# Patient Record
Sex: Female | Born: 1965 | Race: Black or African American | Hispanic: No | Smoking: Current every day smoker
Health system: Southern US, Community
[De-identification: ages and names within clinical notes are randomized; demographics above are authoritative.]

## PROBLEM LIST (undated history)

## (undated) DIAGNOSIS — J40 Bronchitis, not specified as acute or chronic: Secondary | ICD-10-CM

## (undated) DIAGNOSIS — R011 Cardiac murmur, unspecified: Secondary | ICD-10-CM

## (undated) DIAGNOSIS — E785 Hyperlipidemia, unspecified: Secondary | ICD-10-CM

## (undated) DIAGNOSIS — R0600 Dyspnea, unspecified: Secondary | ICD-10-CM

## (undated) DIAGNOSIS — E119 Type 2 diabetes mellitus without complications: Secondary | ICD-10-CM

## (undated) DIAGNOSIS — J302 Other seasonal allergic rhinitis: Secondary | ICD-10-CM

## (undated) DIAGNOSIS — I10 Essential (primary) hypertension: Secondary | ICD-10-CM

## (undated) DIAGNOSIS — J449 Chronic obstructive pulmonary disease, unspecified: Secondary | ICD-10-CM

## (undated) HISTORY — PX: POLYPECTOMY: SHX149

## (undated) HISTORY — DX: Hyperlipidemia, unspecified: E78.5

## (undated) HISTORY — PX: COLONOSCOPY: SHX174

## (undated) HISTORY — PX: CYSTECTOMY: SUR359

## (undated) HISTORY — PX: BACK SURGERY: SHX140

## (undated) HISTORY — DX: Other seasonal allergic rhinitis: J30.2

## (undated) HISTORY — DX: Essential (primary) hypertension: I10

## (undated) HISTORY — PX: ROTATOR CUFF REPAIR: SHX139

## (undated) HISTORY — PX: TYMPANOSTOMY TUBE PLACEMENT: SHX32

---
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
## 1998-04-01 HISTORY — PX: ABDOMINAL HYSTERECTOMY: SHX81

## 2008-01-27 ENCOUNTER — Emergency Department (HOSPITAL_COMMUNITY): Admission: EM | Admit: 2008-01-27 | Discharge: 2008-01-27 | Payer: Self-pay | Admitting: Emergency Medicine

## 2008-03-22 ENCOUNTER — Emergency Department (HOSPITAL_COMMUNITY): Admission: EM | Admit: 2008-03-22 | Discharge: 2008-03-22 | Payer: Self-pay | Admitting: Family Medicine

## 2008-04-27 ENCOUNTER — Other Ambulatory Visit: Admission: RE | Admit: 2008-04-27 | Discharge: 2008-04-27 | Payer: Self-pay | Admitting: Internal Medicine

## 2008-04-27 ENCOUNTER — Encounter: Payer: Self-pay | Admitting: Internal Medicine

## 2008-04-27 ENCOUNTER — Ambulatory Visit: Payer: Self-pay | Admitting: Internal Medicine

## 2008-04-27 ENCOUNTER — Telehealth: Payer: Self-pay | Admitting: Internal Medicine

## 2008-04-27 DIAGNOSIS — N63 Unspecified lump in unspecified breast: Secondary | ICD-10-CM | POA: Insufficient documentation

## 2008-04-27 DIAGNOSIS — J159 Unspecified bacterial pneumonia: Secondary | ICD-10-CM | POA: Insufficient documentation

## 2008-04-27 DIAGNOSIS — R05 Cough: Secondary | ICD-10-CM | POA: Insufficient documentation

## 2008-04-27 DIAGNOSIS — H60399 Other infective otitis externa, unspecified ear: Secondary | ICD-10-CM | POA: Insufficient documentation

## 2008-04-27 DIAGNOSIS — R059 Cough, unspecified: Secondary | ICD-10-CM | POA: Insufficient documentation

## 2008-04-27 LAB — CONVERTED CEMR LAB
Cholesterol: 201 mg/dL (ref 0–200)
Direct LDL: 146 mg/dL
HDL: 46.6 mg/dL (ref >39.0)
Total CHOL/HDL Ratio: 4.3
Triglycerides: 65 mg/dL (ref 0–149)
VLDL: 13 mg/dL (ref 0–40)

## 2008-05-02 ENCOUNTER — Encounter: Payer: Self-pay | Admitting: Internal Medicine

## 2008-09-09 ENCOUNTER — Emergency Department (HOSPITAL_COMMUNITY): Admission: EM | Admit: 2008-09-09 | Discharge: 2008-09-09 | Payer: Self-pay | Admitting: Emergency Medicine

## 2008-09-10 ENCOUNTER — Emergency Department (HOSPITAL_COMMUNITY): Admission: EM | Admit: 2008-09-10 | Discharge: 2008-09-10 | Payer: Self-pay | Admitting: Emergency Medicine

## 2009-02-23 ENCOUNTER — Emergency Department (HOSPITAL_COMMUNITY): Admission: EM | Admit: 2009-02-23 | Discharge: 2009-02-23 | Payer: Self-pay | Admitting: Emergency Medicine

## 2009-03-05 ENCOUNTER — Emergency Department (HOSPITAL_COMMUNITY): Admission: EM | Admit: 2009-03-05 | Discharge: 2009-03-05 | Payer: Self-pay | Admitting: Emergency Medicine

## 2009-05-28 ENCOUNTER — Emergency Department (HOSPITAL_COMMUNITY): Admission: EM | Admit: 2009-05-28 | Discharge: 2009-05-28 | Payer: Self-pay | Admitting: Emergency Medicine

## 2009-07-31 ENCOUNTER — Emergency Department (HOSPITAL_COMMUNITY): Admission: EM | Admit: 2009-07-31 | Discharge: 2009-07-31 | Payer: Self-pay | Admitting: Emergency Medicine

## 2009-09-23 ENCOUNTER — Emergency Department (HOSPITAL_COMMUNITY): Admission: EM | Admit: 2009-09-23 | Discharge: 2009-09-23 | Payer: Self-pay | Admitting: Emergency Medicine

## 2009-12-25 ENCOUNTER — Emergency Department (HOSPITAL_COMMUNITY): Admission: EM | Admit: 2009-12-25 | Discharge: 2009-12-25 | Payer: Self-pay | Admitting: Emergency Medicine

## 2009-12-26 ENCOUNTER — Emergency Department (HOSPITAL_COMMUNITY): Admission: EM | Admit: 2009-12-26 | Discharge: 2009-12-26 | Payer: Self-pay | Admitting: Emergency Medicine

## 2010-01-06 ENCOUNTER — Emergency Department (HOSPITAL_COMMUNITY): Admission: EM | Admit: 2010-01-06 | Discharge: 2010-01-06 | Payer: Self-pay | Admitting: Emergency Medicine

## 2010-02-17 ENCOUNTER — Emergency Department (HOSPITAL_COMMUNITY)
Admission: EM | Admit: 2010-02-17 | Discharge: 2010-02-17 | Payer: Self-pay | Source: Home / Self Care | Admitting: Emergency Medicine

## 2010-02-18 ENCOUNTER — Emergency Department (HOSPITAL_COMMUNITY): Admission: EM | Admit: 2010-02-18 | Discharge: 2010-02-18 | Payer: Self-pay | Admitting: Emergency Medicine

## 2010-03-08 ENCOUNTER — Emergency Department (HOSPITAL_COMMUNITY): Admission: EM | Admit: 2010-03-08 | Discharge: 2010-02-15 | Payer: Self-pay | Admitting: Emergency Medicine

## 2010-05-01 NOTE — Letter (Signed)
Summary: Results Follow-up Letter  [facility]  [address] [place], [place] [address]   Phone: [phone]  Fax: [phone]    05/02/2008  [address] [place], [place]  [address]  Dear Ms. Halseth,   The following are the results of your recent test(s):  Test     Result     Pap Smear    Normal___x____  Not Normal_____       Comments:  __________________________________________ Other Tests:   _________________________________________________________  Please call for an appointment as directed _________________________________________________________ _________________________________________________________ _________________________________________________________  Sincerely,  Sanda Linger MD [facility]

## 2010-05-01 NOTE — Assessment & Plan Note (Signed)
Summary: NEW PT---[employer]--$50---PKG-STC   Vital Signs:  Patient Profile:   45 Years Old Female Height:     66 inches Weight:      190 pounds BMI:     30.78 Temp:     97.0 degrees F oral Pulse rate:   80 / minute Pulse rhythm:   regular BP sitting:   118 / 80  (left arm) Cuff size:   regular  Pt. in pain?   no  Vitals Entered By: Rock Nephew CMA (April 27, 2008 9:09 AM)                  PCP:  Etta Grandchild MD  Chief Complaint:  New to establish.  History of Present Illness: This is a new pt. to me who reports the development of symptoms over the last few weeks with left earache and muffled hearing. She had bilateral tympanic tubes placed about 4-5 years ago in [place].  She has also had a cough recently. She also wants a complete physical with FLP and PAP smear today.   Acute Visit History:      The patient complains of cough and earache.  She denies abdominal pain, chest pain, constipation, diarrhea, eye symptoms, fever, headache, nasal discharge, nausea, rash, sinus problems, sore throat, and vomiting.        The patient notes wheezing and shortness of breath.  The character of the cough is described as nonproductive.  She has no history of COPD.  There is no history of sleep interference, respiratory retractions, tachypnea, cyanosis, or interference with oral intake associated with her cough.        The earache is located on the left side.  She has had recurrent otitis media.  There is no history of recent antibiotic usage or cold/URI symptoms associated with the earache.         Otitis Media History:      Positive risk factors for otitis media include passive smoke exposure.    Allergies: No Known Drug Allergies     Prior Medications Reviewed Using: Patient Recall  Prior Medication List:  No prior medications documented  Updated Prior Medication List: No Medications Current Allergies (reviewed today): No known allergies   Past Medical History:  chronic bronchitis    recurrent OM, s/p bilateral tympanoplasty  Past Surgical History:    Hysterectomy    Left wrist ganglion cyst removed   Family History:    Family History Diabetes 1st degree relative    Family History Hypertension    Family History of Stroke F 1st degree relative <60  Social History:    Occupation: Truck Engineer, drilling    Current Smoker    Alcohol use-no    Drug use-no    Regular exercise-no   Risk Factors:  Tobacco use:  current    Year started:  1985    Cigarettes:  Yes -- 1 pack(s) per day    Counseled to quit/cut down tobacco use:  yes Passive smoke exposure:  yes Drug use:  no HIV high-risk behavior:  no Alcohol use:  no Exercise:  no  Family History Risk Factors:    Family History of MI in females < 56 years old:  no    Family History of MI in males < 32 years old:  no   Review of Systems       The patient complains of prolonged cough, suspicious skin lesions, and breast masses.  The patient denies anorexia,  fever, weight loss, weight gain, vision loss, decreased hearing, hoarseness, chest pain, syncope, dyspnea on exertion, peripheral edema, headaches, hemoptysis, abdominal pain, melena, hematochezia, severe indigestion/heartburn, hematuria, depression, enlarged lymph nodes, and angioedema.     Physical Exam  General:     alert, well-developed, well-nourished, well-hydrated, and overweight-appearing.   Eyes:     No corneal or conjunctival inflammation noted. EOMI. Perrla. Funduscopic exam benign, without hemorrhages, exudates or papilledema. Vision grossly normal. Ears:     L TM erythema and L TM bulging with effusion. right ear still has a tube present in it.  Nose:     External nasal examination shows no deformity or inflammation. Nasal mucosa are pink and moist without lesions or exudates. Mouth:     Oral mucosa and oropharynx without lesions or exudates.  Teeth in good repair. Neck:     No deformities, masses, or tenderness  noted. Breasts:     there is a 1 cm hyperpigmented lesion with a Subcutaneously palpable nodule over the right breast in the lower/outer quadrant. It is firm and nontender, nonfluctuant, and not erythematous.no abnormal thickening, no nipple discharge, no tenderness, and no adenopathy.   Lungs:     Normal respiratory effort, chest expands symmetrically. Lungs are clear to auscultation, no crackles or wheezes. Heart:     Normal rate and regular rhythm. S1 and S2 normal without gallop, murmur, click, rub or other extra sounds. Abdomen:     soft, non-tender, normal bowel sounds, no distention, no masses, no guarding, no rigidity, no rebound tenderness, no abdominal hernia, no inguinal hernia, no hepatomegaly, no splenomegaly, and abdominal scar(s).   Rectal:     No external abnormalities noted. Normal sphincter tone. No rectal masses or tenderness. heme negative stool. Genitalia:     normal introitus, no external lesions, and vaginal discharge, scant amount with foul odor.  normal introitus, no external lesions, mucosa pink and moist, no adnexal masses or tenderness, and vaginal discharge.   Msk:     normal ROM, no joint tenderness, no joint swelling, no joint warmth, no redness over joints, and no joint deformities.   Pulses:     R and L carotid,radial,femoral,dorsalis pedis and posterior tibial pulses are full and equal bilaterally Extremities:     No clubbing, cyanosis, edema, or deformity noted with normal full range of motion of all joints.   Neurologic:     No cranial nerve deficits noted. Station and gait are normal. Plantar reflexes are down-going bilaterally. DTRs are symmetrical throughout. Sensory, motor and coordinative functions appear intact. Skin:     Intact without suspicious lesions or rashes Cervical Nodes:     No lymphadenopathy noted Axillary Nodes:     No palpable lymphadenopathy Inguinal Nodes:     No significant adenopathy Psych:     Oriented X3, memory intact for  recent and remote, normally interactive, good eye contact, not anxious appearing, not depressed appearing, and not agitated.    Vital Signs: Temp        97.0 Pulse        80 Weight       190 lb  Pediatric Physical Exam: General:     alert, well-developed, well-nourished, well-hydrated, and overweight-appearing.   Skin:     Intact without suspicious lesions or rashes Eyes:     No corneal or conjunctival inflammation noted. EOMI. Perrla. Funduscopic exam benign, without hemorrhages, exudates or papilledema. Vision grossly normal. Nose:     External nasal examination shows no deformity or inflammation. Nasal  mucosa are pink and moist without lesions or exudates. Throat:      Oral mucosa and oropharynx without lesions or exudates.  Teeth in good repair. Neck:     No deformities, masses, or tenderness noted. Lungs:       Normal respiratory effort, chest expands symmetrically. Lungs are clear to auscultation, no crackles or wheezes. Heart:       Normal rate and regular rhythm. S1 and S2 normal without gallop, murmur, click, rub or other extra sounds. Abdomen:   soft, non-tender, normal bowel sounds, no distention, no masses, no guarding, no rigidity, no rebound tenderness, no abdominal hernia, no inguinal hernia, no hepatomegaly, no splenomegaly, and abdominal scar(s).   Neurologic:   No cranial nerve deficits noted. Station and gait are normal. Plantar reflexes are down-going bilaterally. DTRs are symmetrical throughout. Sensory, motor and coordinative functions appear intact.    Impression & Recommendations:  Problem # 1:  COUGH (ICD-786.2) Assessment: New  Orders: T-2 View CXR, Same Day (71020.5TC)   Problem # 2:  LUMP OR MASS IN BREAST (UJW-119.14) Assessment: New schedule mammogram, order faxed to [facility].  Problem # 3:  ROUTINE GENERAL MEDICAL EXAM@[email or web address]  CARE FACL (ICD-V70.0) Assessment: New EKG shows NSR with no Q waves and no ST/T wave  abnormalities. 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 Take one (1) tablet by mouth two (2) times a day x 10 days    Zithromax 500 Mg Tab (Azithromycin) .Marland Kitchen... Take on by mouth once daily for days   Complete Medication List: 1)  Ceftin 500 Mg Tab (Cefuroxime axetil) .... Take one (1) tablet by mouth two (2) times a day x 10 days 2)  Zithromax 500 Mg Tab (Azithromycin) .... Take on by mouth once daily for days   Patient Instructions: 1)  Please schedule a follow-up appointment in 2 weeks. 2)  Tobacco is very bad for your health and your loved ones! You Should stop smoking!. 3)  Stop Smoking Tips: Choose a Quit date. Cut down before the Quit date. decide what you will do as a substitute when you feel the urge to smoke(gum,toothpick,exercise). 4)  It is important that you exercise regularly at least 20 minutes 5 times a week. If you develop chest pain, have severe difficulty breathing, or feel very tired , stop exercising immediately and seek medical attention. 5)  You need to lose weight. Consider a lower calorie diet and regular exercise.  6)  Schedule your mammogram.   Prescriptions: ZITHROMAX 500 MG TAB (AZITHROMYCIN) take on by mouth once daily for days  #3 x 0   Entered and Authorized by:   Etta Grandchild MD   Signed by:   Etta Grandchild MD on 04/27/2008   Method used:   Historical   RxID:   [id number] CEFTIN 500 MG TAB (CEFUROXIME AXETIL) Take one (1) tablet by mouth two (2) times a day X 10 days  #20 x 0   Entered and Authorized by:   Etta Grandchild MD   Signed by:   Etta Grandchild MD on 04/27/2008   Method used:   Print then Give  to Patient   RxID:   [id number]  Pneumovax Immunization History:    Pneumovax # 1:  Pneumovax (01/04/2004)

## 2010-05-01 NOTE — Progress Notes (Signed)
Summary: antibiotic  ---- Converted from flag ---- ---- 04/27/2008 12:47 PM, Etta Grandchild MD wrote: please call her and tell her the chest xray was positive for pneumonia, we need to get a pharmacy for antibiotics to get started ------------------------------  Phone Note Outgoing Call   Call placed by: Rock Nephew CMA,  April 27, 2008 1:06 PM Call placed to: Patient Summary of Call: Called patient//lmovm to call back. Need name of pharmacy to call in antibiotic per MD Initial call taken by: Rock Nephew CMA,  April 27, 2008 1:06 PM  Follow-up for Phone Call        Called patient and advised of results. Patient could not remember pharmacy info and states that she will call back to give information Follow-up by: Rock Nephew CMA,  April 28, 2008 9:24 AM  Additional Follow-up for Phone Call Additional follow up Details #1::        Pharm entered in pt's chart Additional Follow-up by: Payton Spark CMA,  April 28, 2008 11:41 AM      Appended Document: antibiotic Prescriptions: ZITHROMAX 500 MG TAB (AZITHROMYCIN) take on by mouth once daily for days  #3 x 0   Entered and Authorized by:   Etta Grandchild MD   Signed by:   Etta Grandchild MD on 04/28/2008   Method used:   Electronically to        [facility] [id number] (retail)       [address] [place], [place]  [address]       Ph: [phone]       Fax: [phone]   RxID:   [id number]

## 2010-05-13 ENCOUNTER — Emergency Department (HOSPITAL_COMMUNITY)
Admission: EM | Admit: 2010-05-13 | Discharge: 2010-05-13 | Disposition: A | Discharge status: Home / Self Care | Payer: Self-pay | Attending: Emergency Medicine | Admitting: Emergency Medicine

## 2010-05-13 DIAGNOSIS — Z9889 Other specified postprocedural states: Secondary | ICD-10-CM | POA: Insufficient documentation

## 2010-05-13 DIAGNOSIS — M542 Cervicalgia: Secondary | ICD-10-CM | POA: Insufficient documentation

## 2010-05-13 DIAGNOSIS — N390 Urinary tract infection, site not specified: Secondary | ICD-10-CM | POA: Insufficient documentation

## 2010-05-13 DIAGNOSIS — A5901 Trichomonal vulvovaginitis: Secondary | ICD-10-CM | POA: Insufficient documentation

## 2010-05-13 DIAGNOSIS — G8929 Other chronic pain: Secondary | ICD-10-CM | POA: Insufficient documentation

## 2010-05-13 DIAGNOSIS — R319 Hematuria, unspecified: Secondary | ICD-10-CM | POA: Insufficient documentation

## 2010-05-13 DIAGNOSIS — N898 Other specified noninflammatory disorders of vagina: Secondary | ICD-10-CM | POA: Insufficient documentation

## 2010-05-13 LAB — URINALYSIS, ROUTINE W REFLEX MICROSCOPIC
Bilirubin Urine: NEGATIVE
Hgb urine dipstick: NEGATIVE
Ketones, ur: NEGATIVE mg/dL
Nitrite: NEGATIVE
Protein, ur: NEGATIVE mg/dL
Specific Gravity, Urine: 1.021 (ref 1.005–1.030)
Urine Glucose, Fasting: NEGATIVE mg/dL
Urobilinogen, UA: 0.2 mg/dL (ref 0.0–1.0)
pH: 6 (ref 5.0–8.0)

## 2010-05-13 LAB — CBC
HCT: 43 % (ref 36.0–46.0)
Hemoglobin: 14.6 g/dL (ref 12.0–15.0)
MCH: 27.9 pg (ref 26.0–34.0)
MCHC: 34 g/dL (ref 30.0–36.0)
MCV: 82.1 fL (ref 78.0–100.0)
Platelets: 207 10*3/uL (ref 150–400)
RBC: 5.24 MIL/uL — ABNORMAL HIGH (ref 3.87–5.11)
RDW: 15.8 % — ABNORMAL HIGH (ref 11.5–15.5)
WBC: 9.9 10*3/uL (ref 4.0–10.5)

## 2010-05-13 LAB — URINE MICROSCOPIC-ADD ON

## 2010-05-13 LAB — DIFFERENTIAL
Basophils Absolute: 0.1 10*3/uL (ref 0.0–0.1)
Basophils Relative: 1 % (ref 0–1)
Eosinophils Absolute: 0.3 10*3/uL (ref 0.0–0.7)
Eosinophils Relative: 3 % (ref 0–5)
Lymphocytes Relative: 40 % (ref 12–46)
Lymphs Abs: 3.9 10*3/uL (ref 0.7–4.0)
Monocytes Absolute: 0.6 10*3/uL (ref 0.1–1.0)
Monocytes Relative: 6 % (ref 3–12)
Neutro Abs: 5.1 10*3/uL (ref 1.7–7.7)
Neutrophils Relative %: 51 % (ref 43–77)

## 2010-05-13 LAB — WET PREP, GENITAL
Clue Cells Wet Prep HPF POC: NONE SEEN
Yeast Wet Prep HPF POC: NONE SEEN

## 2010-05-15 LAB — GC/CHLAMYDIA PROBE AMP, GENITAL
Chlamydia, DNA Probe: NEGATIVE
GC Probe Amp, Genital: NEGATIVE

## 2010-06-14 LAB — RAPID STREP SCREEN (MED CTR MEBANE ONLY): Streptococcus, Group A Screen (Direct): NEGATIVE

## 2010-09-11 ENCOUNTER — Emergency Department (HOSPITAL_COMMUNITY)
Admission: EM | Admit: 2010-09-11 | Discharge: 2010-09-12 | Discharge status: Against Medical Advice | Payer: Self-pay | Attending: Emergency Medicine | Admitting: Emergency Medicine

## 2010-09-11 DIAGNOSIS — Z0389 Encounter for observation for other suspected diseases and conditions ruled out: Secondary | ICD-10-CM | POA: Insufficient documentation

## 2010-10-22 ENCOUNTER — Emergency Department (HOSPITAL_COMMUNITY)
Admission: EM | Admit: 2010-10-22 | Discharge: 2010-10-22 | Disposition: A | Discharge status: Home / Self Care | Payer: Self-pay | Attending: Emergency Medicine | Admitting: Emergency Medicine

## 2010-10-22 ENCOUNTER — Emergency Department (HOSPITAL_COMMUNITY): Payer: Self-pay

## 2010-10-22 DIAGNOSIS — R05 Cough: Secondary | ICD-10-CM | POA: Insufficient documentation

## 2010-10-22 DIAGNOSIS — R6889 Other general symptoms and signs: Secondary | ICD-10-CM | POA: Insufficient documentation

## 2010-10-22 DIAGNOSIS — J3489 Other specified disorders of nose and nasal sinuses: Secondary | ICD-10-CM | POA: Insufficient documentation

## 2010-10-22 DIAGNOSIS — F17200 Nicotine dependence, unspecified, uncomplicated: Secondary | ICD-10-CM | POA: Insufficient documentation

## 2010-10-22 DIAGNOSIS — J9801 Acute bronchospasm: Secondary | ICD-10-CM | POA: Insufficient documentation

## 2010-10-22 DIAGNOSIS — J069 Acute upper respiratory infection, unspecified: Secondary | ICD-10-CM | POA: Insufficient documentation

## 2010-10-22 DIAGNOSIS — R059 Cough, unspecified: Secondary | ICD-10-CM | POA: Insufficient documentation

## 2010-10-22 DIAGNOSIS — R011 Cardiac murmur, unspecified: Secondary | ICD-10-CM | POA: Insufficient documentation

## 2010-10-22 DIAGNOSIS — J40 Bronchitis, not specified as acute or chronic: Secondary | ICD-10-CM | POA: Insufficient documentation

## 2010-10-24 ENCOUNTER — Emergency Department (HOSPITAL_COMMUNITY)
Admission: EM | Admit: 2010-10-24 | Discharge: 2010-10-24 | Disposition: A | Discharge status: Home / Self Care | Payer: Self-pay | Attending: Emergency Medicine | Admitting: Emergency Medicine

## 2010-10-24 DIAGNOSIS — R05 Cough: Secondary | ICD-10-CM | POA: Insufficient documentation

## 2010-10-24 DIAGNOSIS — R059 Cough, unspecified: Secondary | ICD-10-CM | POA: Insufficient documentation

## 2010-10-24 DIAGNOSIS — R062 Wheezing: Secondary | ICD-10-CM | POA: Insufficient documentation

## 2011-01-01 LAB — CBC
HCT: 42.4
Hemoglobin: 14.2
MCHC: 33.4
MCV: 81.1
Platelets: 216
RBC: 5.23 — ABNORMAL HIGH
RDW: 14.5
WBC: 9.9

## 2011-01-01 LAB — DIFFERENTIAL
Basophils Absolute: 0.1
Basophils Relative: 1
Eosinophils Absolute: 0.4
Eosinophils Relative: 4
Lymphocytes Relative: 26
Lymphs Abs: 2.5
Monocytes Absolute: 0.7
Monocytes Relative: 7
Neutro Abs: 6.2
Neutrophils Relative %: 62

## 2011-05-16 ENCOUNTER — Emergency Department (HOSPITAL_COMMUNITY): Payer: Self-pay

## 2011-05-16 ENCOUNTER — Emergency Department (HOSPITAL_COMMUNITY)
Admission: EM | Admit: 2011-05-16 | Discharge: 2011-05-16 | Disposition: A | Discharge status: Home / Self Care | Payer: Self-pay | Attending: Emergency Medicine | Admitting: Emergency Medicine

## 2011-05-16 ENCOUNTER — Encounter (HOSPITAL_COMMUNITY): Payer: Self-pay | Admitting: *Deleted

## 2011-05-16 DIAGNOSIS — J069 Acute upper respiratory infection, unspecified: Secondary | ICD-10-CM | POA: Insufficient documentation

## 2011-05-16 DIAGNOSIS — F17200 Nicotine dependence, unspecified, uncomplicated: Secondary | ICD-10-CM | POA: Insufficient documentation

## 2011-05-16 DIAGNOSIS — R05 Cough: Secondary | ICD-10-CM | POA: Insufficient documentation

## 2011-05-16 DIAGNOSIS — J3489 Other specified disorders of nose and nasal sinuses: Secondary | ICD-10-CM | POA: Insufficient documentation

## 2011-05-16 DIAGNOSIS — R059 Cough, unspecified: Secondary | ICD-10-CM | POA: Insufficient documentation

## 2011-05-16 MED ORDER — ALBUTEROL SULFATE HFA 108 (90 BASE) MCG/ACT IN AERS
2.0000 | INHALATION_SPRAY | RESPIRATORY_TRACT | Status: DC
  Administered 2011-05-16: 2 via RESPIRATORY_TRACT
  Filled 2011-05-16: qty 6.7

## 2011-05-16 MED ORDER — OXYCODONE HCL 5 MG PO TABS
5.0000 mg | ORAL_TABLET | Freq: Once | ORAL | Status: AC
  Administered 2011-05-16: 5 mg via ORAL
  Filled 2011-05-16: qty 1

## 2011-05-16 NOTE — ED Notes (Signed)
Pt stated understanding of discharge instructions.

## 2011-05-16 NOTE — ED Provider Notes (Signed)
History     CSN: [id number]  Arrival date & time 05/16/11  0100   First MD Initiated Contact with Patient 05/16/11 0158      Chief Complaint  Patient presents with  . Influenza     Patient is a 46 y.o. female presenting with flu symptoms. The history is provided by the patient.  Influenza  the patient reports upper respiratory symptoms for approximately 24 hours.  She's had cough and nasal congestion.  She's had sinus pressure.  She denies sore throat.  She denies fevers and chills.  Nothing worsens her symptoms.  Nothing improves her symptoms.  Her symptoms are mild in severity.  She reports she was around someone yesterday who had a "cold"  History reviewed. No pertinent past medical history.  History reviewed. No pertinent past surgical history.  History reviewed. No pertinent family history.  History  Substance Use Topics  . Smoking status: Current Everyday Smoker  . Smokeless tobacco: Not on file  . Alcohol Use: Yes    OB History    Grav Para Term Preterm Abortions TAB SAB Ect Mult Living                  Review of Systems  Allergies  Hydrocodone and Mobic  Home Medications   Current Outpatient Rx  Name Route Sig Dispense Refill  . ASPIRIN EC 81 MG PO TBEC Oral Take 81 mg by mouth daily as needed. Shoulder pain    . NAPROXEN 500 MG PO TABS Oral Take 500 mg by mouth 2 (two) times daily with a meal. Shoulder pain    . OXYCODONE-ACETAMINOPHEN 5-325 MG PO TABS Oral Take 1-2 tablets by mouth every 4 (four) hours as needed. Pain May have every 4 to 6 hours    . TRAMADOL HCL 50 MG PO TABS Oral Take 50 mg by mouth every 8 (eight) hours as needed. Shoulder pain      BP 146/81  Pulse 92  Temp 98.4 F (36.9 C)  Resp 22  SpO2 98%  Physical Exam  Nursing note and vitals reviewed. Constitutional: She is oriented to person, place, and time. She appears well-developed and well-nourished. No distress.  HENT:  Head: Normocephalic and atraumatic.  Eyes: EOM are  normal.  Neck: Normal range of motion.  Cardiovascular: Normal rate, regular rhythm and normal heart sounds.   Pulmonary/Chest: Effort normal. She has wheezes.  Abdominal: Soft. She exhibits no distension. There is no tenderness.  Musculoskeletal: Normal range of motion.  Neurological: She is alert and oriented to person, place, and time.  Skin: Skin is warm and dry.  Psychiatric: She has a normal mood and affect. Judgment normal.    ED Course  Procedures (including critical care time)  Labs Reviewed - No data to display Dg Chest 2 View  05/16/2011  *RADIOLOGY REPORT*  Clinical Data: Influenza  CHEST - 2 VIEW  Comparison: 10/22/2010  Findings: Shallow inspiration.  Heart size and pulmonary vascularity are normal for technique.  Peribronchial thickening and perihilar interstitial changes consistent with chronic bronchitis or reactive airways disease.  No focal airspace consolidation.  No blunting of costophrenic angles.  No pneumothorax.  Since the previous study, there is been interval resection or resorption of the distal right clavicle.  Otherwise, no change.  IMPRESSION: Peribronchial thickening interstitial changes consistent with chronic bronchitis.  No focal consolidation.  Original Report Authenticated By: Marlon Pel, M.D.   I personally reviewed the x-ray  1. Upper respiratory tract infection  MDM  Likely viral upper respiratory tract infections.  The patient is well-appearing.  She is nontoxic.  No hypoxia on exam.   Normal work of breathing. .  Close followup with PCP         Lyanne Co, MD 05/16/11 [phone]

## 2011-05-16 NOTE — ED Notes (Signed)
Pt c/o cough headache chest congestion for 2 days.  No idea of temp

## 2011-05-16 NOTE — ED Notes (Signed)
PT c/o cough since yesterday. Denies N/V, diaphoresis, or fever

## 2011-05-18 ENCOUNTER — Emergency Department (HOSPITAL_COMMUNITY)
Admission: EM | Admit: 2011-05-18 | Discharge: 2011-05-18 | Discharge status: Against Medical Advice | Payer: Self-pay | Attending: Emergency Medicine | Admitting: Emergency Medicine

## 2011-05-18 ENCOUNTER — Encounter (HOSPITAL_COMMUNITY): Payer: Self-pay | Admitting: Emergency Medicine

## 2011-05-18 DIAGNOSIS — R059 Cough, unspecified: Secondary | ICD-10-CM | POA: Insufficient documentation

## 2011-05-18 DIAGNOSIS — R05 Cough: Secondary | ICD-10-CM | POA: Insufficient documentation

## 2011-05-18 HISTORY — DX: Bronchitis, not specified as acute or chronic: J40

## 2011-05-18 NOTE — ED Notes (Signed)
C/o wheezing x 2 days.  Reports productive cough with yellow sputum.

## 2011-05-19 ENCOUNTER — Encounter (HOSPITAL_COMMUNITY): Payer: Self-pay | Admitting: *Deleted

## 2011-05-19 ENCOUNTER — Emergency Department (HOSPITAL_COMMUNITY)
Admission: EM | Admit: 2011-05-19 | Discharge: 2011-05-19 | Disposition: A | Discharge status: Home / Self Care | Payer: Self-pay | Attending: Emergency Medicine | Admitting: Emergency Medicine

## 2011-05-19 ENCOUNTER — Emergency Department (HOSPITAL_COMMUNITY): Payer: Self-pay

## 2011-05-19 DIAGNOSIS — J40 Bronchitis, not specified as acute or chronic: Secondary | ICD-10-CM | POA: Insufficient documentation

## 2011-05-19 DIAGNOSIS — J3489 Other specified disorders of nose and nasal sinuses: Secondary | ICD-10-CM | POA: Insufficient documentation

## 2011-05-19 DIAGNOSIS — Z7982 Long term (current) use of aspirin: Secondary | ICD-10-CM | POA: Insufficient documentation

## 2011-05-19 DIAGNOSIS — R059 Cough, unspecified: Secondary | ICD-10-CM | POA: Insufficient documentation

## 2011-05-19 DIAGNOSIS — R509 Fever, unspecified: Secondary | ICD-10-CM | POA: Insufficient documentation

## 2011-05-19 DIAGNOSIS — H6690 Otitis media, unspecified, unspecified ear: Secondary | ICD-10-CM | POA: Insufficient documentation

## 2011-05-19 DIAGNOSIS — R070 Pain in throat: Secondary | ICD-10-CM | POA: Insufficient documentation

## 2011-05-19 DIAGNOSIS — R05 Cough: Secondary | ICD-10-CM | POA: Insufficient documentation

## 2011-05-19 DIAGNOSIS — F17200 Nicotine dependence, unspecified, uncomplicated: Secondary | ICD-10-CM | POA: Insufficient documentation

## 2011-05-19 DIAGNOSIS — Z79899 Other long term (current) drug therapy: Secondary | ICD-10-CM | POA: Insufficient documentation

## 2011-05-19 DIAGNOSIS — R0989 Other specified symptoms and signs involving the circulatory and respiratory systems: Secondary | ICD-10-CM | POA: Insufficient documentation

## 2011-05-19 MED ORDER — PREDNISONE 20 MG PO TABS
40.0000 mg | ORAL_TABLET | Freq: Once | ORAL | Status: AC
  Administered 2011-05-19: 40 mg via ORAL
  Filled 2011-05-19: qty 2

## 2011-05-19 MED ORDER — AZITHROMYCIN 250 MG PO TABS: 250.0000 mg | ORAL_TABLET | Freq: Every day | ORAL | Status: AC

## 2011-05-19 MED ORDER — IPRATROPIUM BROMIDE 0.02 % IN SOLN
0.5000 mg | RESPIRATORY_TRACT | Status: AC
  Administered 2011-05-19: 0.5 mg via RESPIRATORY_TRACT
  Filled 2011-05-19: qty 2.5

## 2011-05-19 MED ORDER — PREDNISONE 20 MG PO TABS: 20.0000 mg | ORAL_TABLET | Freq: Every day | ORAL | Status: AC

## 2011-05-19 MED ORDER — ALBUTEROL SULFATE (5 MG/ML) 0.5% IN NEBU
5.0000 mg | INHALATION_SOLUTION | Freq: Once | RESPIRATORY_TRACT | Status: AC
  Administered 2011-05-19: 5 mg via RESPIRATORY_TRACT
  Filled 2011-05-19: qty 1

## 2011-05-19 NOTE — ED Provider Notes (Signed)
History     CSN: [id number]  Arrival date & time 05/19/11  0046   First MD Initiated Contact with Patient 05/19/11 0050      Chief Complaint  Patient presents with  . Wheezing    (Consider location/radiation/quality/duration/timing/severity/associated sxs/prior treatment) HPI Comments: 46 year old female with a history of bronchitis and chronic tobacco abuse who presents with ongoing cough, nasal congestion and sore throat. She was seen approximately 2 days ago in the emergency department for similar symptoms and a normal chest x-ray at that time. She states that despite using albuterol inhalers at home she continues to have wheezing, coughing and shortness of breath. Symptoms are persistent, moderate, nothing makes better or worse, no associated fevers chills nausea vomiting abdominal pain leg swelling.  The history is provided by the patient and medical records.    Past Medical History  Diagnosis Date  . Bronchitis     Past Surgical History  Procedure Date  . Abdominal hysterectomy     History reviewed. No pertinent family history.  History  Substance Use Topics  . Smoking status: Current Everyday Smoker  . Smokeless tobacco: Not on file  . Alcohol Use: Yes    OB History    Grav Para Term Preterm Abortions TAB SAB Ect Mult Living                  Review of Systems  All other systems reviewed and are negative.    Allergies  Hydrocodone and Mobic  Home Medications   Current Outpatient Rx  Name Route Sig Dispense Refill  . ASPIRIN EC 81 MG PO TBEC Oral Take 81 mg by mouth daily as needed. Shoulder pain    . NAPROXEN 500 MG PO TABS Oral Take 500 mg by mouth 2 (two) times daily with a meal. Shoulder pain    . OXYCODONE-ACETAMINOPHEN 5-325 MG PO TABS Oral Take 1-2 tablets by mouth every 4 (four) hours as needed. Pain May have every 4 to 6 hours    . TRAMADOL HCL 50 MG PO TABS Oral Take 50 mg by mouth every 8 (eight) hours as needed. Shoulder pain    .  AZITHROMYCIN 250 MG PO TABS Oral Take 1 tablet (250 mg total) by mouth daily. 500mg  PO day 1, then 250mg  PO days 205 6 tablet 0  . PREDNISONE 20 MG PO TABS Oral Take 1 tablet (20 mg total) by mouth daily. 10 tablet 0    BP 144/89  Pulse 102  Temp(Src) 99.5 F (37.5 C) (Oral)  Resp 20  SpO2 95%  Physical Exam  Nursing note and vitals reviewed. Constitutional: She appears well-developed and well-nourished. No distress.  HENT:  Head: Normocephalic and atraumatic.  Mouth/Throat: Oropharynx is clear and moist. No oropharyngeal exudate.  Eyes: Conjunctivae and EOM are normal. Pupils are equal, round, and reactive to light. Right eye exhibits no discharge. Left eye exhibits no discharge. No scleral icterus.  Neck: Normal range of motion. Neck supple. No JVD present. No thyromegaly present.  Cardiovascular: Normal rate, regular rhythm, normal heart sounds and intact distal pulses.  Exam reveals no gallop and no friction rub.   No murmur heard. Pulmonary/Chest: Effort normal. No respiratory distress. She has wheezes. She has rales ( Scattered rales at the bases bilaterally, clears with breathing and coughing).  Abdominal: Soft. Bowel sounds are normal. She exhibits no distension and no mass. There is no tenderness.  Musculoskeletal: Normal range of motion. She exhibits no edema and no tenderness.  Lymphadenopathy:  She has no cervical adenopathy.  Neurological: She is alert. Coordination normal.  Skin: Skin is warm and dry. No rash noted. No erythema.  Psychiatric: She has a normal mood and affect. Her behavior is normal.    ED Course  Procedures (including critical care time)  Labs Reviewed - No data to display Dg Chest 2 View  05/19/2011  *RADIOLOGY REPORT*  Clinical Data: Cough, fever, shortness of breath and chest congestion.  CHEST - 2 VIEW  Comparison: Chest radiograph performed 05/15/2013  Findings: The lungs are well-aerated.  Mildly worsened peribronchial thickening and  increased interstitial markings are seen; given the lack of cardiomegaly, edema is considered less likely.  This could reflect an atypical infection, given clinical concern.  No pleural effusion or pneumothorax is seen.  The heart is normal in size; the mediastinal contour is within normal limits.  No acute osseous abnormalities are seen.  IMPRESSION: Mildly worsened peribronchial thickening and increased interstitial markings.  This could reflect an atypical infection given clinical concern; given the lack of associated findings, edema is considered less likely.  Original Report Authenticated By: Tonia Ghent, M.D.     1. Acute otitis media   2. Bronchitis       MDM  Oxygen level 97-99% on room air, no increased work of breathing, pulse of 100, temperature of 99.5. Repeat chest x-ray rule out developing pneumonia, albuterol inhaler, prednisone. Overall patient is nontoxic in appearance    Improved after medications, prednisone, Z-Pak given for otitis media and bronchitis. Well appearing on discharge  Vida Roller, MD 05/19/11 [phone]

## 2011-05-19 NOTE — ED Notes (Signed)
Pt c/o wheezing x 3 days. Pt smokes, c/o bronchitis

## 2011-05-19 NOTE — Discharge Instructions (Signed)
Call your doctor in the morning for a repeat evaluation within one to 2 days. Take the medication called a Z-Pak for the next 5 days for your urine infection and to help with bronchitis. Take prednisone once a day for 5 days. Your chest x-ray did not show a pneumonia. You may return to the emergency department for severe or worsening symptoms.

## 2011-06-14 ENCOUNTER — Encounter (HOSPITAL_COMMUNITY): Payer: Self-pay | Admitting: Emergency Medicine

## 2011-06-14 ENCOUNTER — Emergency Department (HOSPITAL_COMMUNITY): Payer: Self-pay

## 2011-06-14 ENCOUNTER — Emergency Department (HOSPITAL_COMMUNITY)
Admission: EM | Admit: 2011-06-14 | Discharge: 2011-06-14 | Disposition: A | Discharge status: Home / Self Care | Payer: Self-pay | Attending: Emergency Medicine | Admitting: Emergency Medicine

## 2011-06-14 DIAGNOSIS — J40 Bronchitis, not specified as acute or chronic: Secondary | ICD-10-CM | POA: Insufficient documentation

## 2011-06-14 DIAGNOSIS — Z79899 Other long term (current) drug therapy: Secondary | ICD-10-CM | POA: Insufficient documentation

## 2011-06-14 DIAGNOSIS — J45909 Unspecified asthma, uncomplicated: Secondary | ICD-10-CM | POA: Insufficient documentation

## 2011-06-14 DIAGNOSIS — F17200 Nicotine dependence, unspecified, uncomplicated: Secondary | ICD-10-CM | POA: Insufficient documentation

## 2011-06-14 DIAGNOSIS — R05 Cough: Secondary | ICD-10-CM | POA: Insufficient documentation

## 2011-06-14 DIAGNOSIS — R059 Cough, unspecified: Secondary | ICD-10-CM | POA: Insufficient documentation

## 2011-06-14 DIAGNOSIS — R0602 Shortness of breath: Secondary | ICD-10-CM | POA: Insufficient documentation

## 2011-06-14 DIAGNOSIS — Z7982 Long term (current) use of aspirin: Secondary | ICD-10-CM | POA: Insufficient documentation

## 2011-06-14 MED ORDER — ALBUTEROL SULFATE (5 MG/ML) 0.5% IN NEBU
5.0000 mg | INHALATION_SOLUTION | Freq: Once | RESPIRATORY_TRACT | Status: AC
  Administered 2011-06-14: 5 mg via RESPIRATORY_TRACT
  Filled 2011-06-14: qty 1

## 2011-06-14 MED ORDER — IPRATROPIUM BROMIDE 0.02 % IN SOLN
0.5000 mg | Freq: Once | RESPIRATORY_TRACT | Status: AC
  Administered 2011-06-14: 0.5 mg via RESPIRATORY_TRACT
  Filled 2011-06-14: qty 2.5

## 2011-06-14 MED ORDER — ALBUTEROL SULFATE HFA 108 (90 BASE) MCG/ACT IN AERS: 2.0000 | INHALATION_SPRAY | RESPIRATORY_TRACT | Status: DC | PRN

## 2011-06-14 NOTE — ED Notes (Signed)
Patient with URI symptoms for last two days.  Now with audible wheezing.

## 2011-06-14 NOTE — ED Provider Notes (Signed)
History     CSN: [id number]  Arrival date & time 06/14/11  0300   First MD Initiated Contact with Patient 06/14/11 [phone]      Chief Complaint  Patient presents with  . Shortness of Breath  . Wheezing    (Consider location/radiation/quality/duration/timing/severity/associated sxs/prior treatment) HPI Comments: Patient presents with symptoms of coughing congestion for the last 2 days.  She's noted that she's begun to wheeze as well.  She denies a history of asthma or COPD but notes that when she gets sick she can have wheezing and required nebulizer treatments.  She does not have an albuterol inhaler at home.  She does currently smoke daily.  Patient denies any other chest pain, nausea, vomiting or fevers.  Patient is a 46 y.o. female presenting with cough. The history is provided by the patient. No language interpreter was used.  Cough This is a new problem. The cough is productive of sputum. There has been no fever. Associated symptoms include wheezing. Pertinent negatives include no chest pain, no chills, no headaches, no shortness of breath and no eye redness.    Past Medical History  Diagnosis Date  . Bronchitis     Past Surgical History  Procedure Date  . Abdominal hysterectomy   . Rotator cuff repair     History reviewed. No pertinent family history.  History  Substance Use Topics  . Smoking status: Current Everyday Smoker    Types: Cigarettes  . Smokeless tobacco: Not on file  . Alcohol Use: Yes    OB History    Grav Para Term Preterm Abortions TAB SAB Ect Mult Living                  Review of Systems  Constitutional: Negative.  Negative for fever and chills.  HENT: Positive for congestion.   Eyes: Negative.  Negative for discharge and redness.  Respiratory: Positive for cough and wheezing. Negative for shortness of breath.   Cardiovascular: Negative.  Negative for chest pain.  Gastrointestinal: Negative.  Negative for nausea, vomiting, abdominal pain and  diarrhea.  Genitourinary: Negative.  Negative for dysuria and vaginal discharge.  Musculoskeletal: Negative.  Negative for back pain.  Skin: Negative.  Negative for color change and rash.  Neurological: Negative.  Negative for syncope and headaches.  Hematological: Negative.  Negative for adenopathy.  Psychiatric/Behavioral: Negative.  Negative for confusion.  All other systems reviewed and are negative.    Allergies  Hydrocodone and Mobic  Home Medications   Current Outpatient Rx  Name Route Sig Dispense Refill  . ASPIRIN EC 81 MG PO TBEC Oral Take 81 mg by mouth daily. Shoulder pain    . NAPROXEN 500 MG PO TABS Oral Take 500 mg by mouth 2 (two) times daily with a meal. Shoulder pain    . TRAMADOL HCL 50 MG PO TABS Oral Take 50 mg by mouth every 8 (eight) hours as needed. Shoulder pain      BP 144/77  Pulse 98  Temp(Src) 98.4 F (36.9 C) (Oral)  Resp 18  SpO2 98%  Physical Exam  Nursing note and vitals reviewed. Constitutional: She is oriented to person, place, and time. She appears well-developed and well-nourished.  Non-toxic appearance. She does not have a sickly appearance.  HENT:  Head: Normocephalic and atraumatic.  Eyes: Conjunctivae, EOM and lids are normal. Pupils are equal, round, and reactive to light. No scleral icterus.  Neck: Trachea normal and normal range of motion. Neck supple.  Cardiovascular: Normal rate, regular  rhythm and normal heart sounds.   Pulmonary/Chest: Effort normal. No respiratory distress. She has wheezes. She has no rales.       Patient with expiratory wheezing on exam  Abdominal: Soft. Normal appearance. There is no tenderness. There is no rebound, no guarding and no CVA tenderness.  Musculoskeletal: Normal range of motion.  Neurological: She is alert and oriented to person, place, and time. She has normal strength.  Skin: Skin is warm, dry and intact. No rash noted.  Psychiatric: She has a normal mood and affect. Her behavior is normal.  Judgment and thought content normal.    ED Course  Procedures (including critical care time)  Labs Reviewed - No data to display Dg Chest 2 View  06/14/2011  *RADIOLOGY REPORT*  Clinical Data: Shortness of breath  CHEST - 2 VIEW  Comparison: 05/19/2011  Findings: Mild peribronchial thickening, less prominent from the most recent prior and similar to earlier prios. This may reflect sequelae of chronic bronchitis.  No focal consolidation.  No pleural effusion or pneumothorax.  No acute osseous abnormality. Cardiomediastinal contours within normal limits.  IMPRESSION: Mild peribronchial thickening without focal consolidation.  Original Report Authenticated By: Waneta Martins, M.D.     No diagnosis found.    MDM  Patient with likely viral infection causing her cough which is causing some reactive airway disease.  Patient is improving with nebulizer treatments here.  Patient smokes and has been advised that she should stop.  Patient will receive an albuterol inhaler to be used at home as well.  I Anticipate this patient will be able to be discharged home with albuterol at home.        Nat Christen, MD 06/14/11 0630

## 2011-06-14 NOTE — Discharge Instructions (Signed)
Albuterol inhalation aerosol What is this medicine? ALBUTEROL (al Gaspar Bidding) is a bronchodilator. It helps open up the airways in your lungs to make it easier to breathe. This medicine is used to treat and to prevent bronchospasm. This medicine may be used for other purposes; ask your health care provider or pharmacist if you have questions. What should I tell my health care provider before I take this medicine? They need to know if you have any of the following conditions: -diabetes -heart disease or irregular heartbeat -high blood pressure -pheochromocytoma -seizures -thyroid disease -an unusual or allergic reaction to albuterol, levalbuterol, sulfites, other medicines, foods, dyes, or preservatives -pregnant or trying to get pregnant -breast-feeding How should I use this medicine? This medicine is for inhalation through the mouth. Follow the directions on your prescription label. Take your medicine at regular intervals. Do not use more often than directed. Make sure that you are using your inhaler correctly. Ask you doctor or health care provider if you have any questions. Use this medicine before you use any other inhaler. Wait 5 minutes or more before between using different inhalers. Talk to your pediatrician regarding the use of this medicine in children. Special care may be needed. Overdosage: If you think you have taken too much of this medicine contact a poison control center or emergency room at once. NOTE: This medicine is only for you. Do not share this medicine with others. What if I miss a dose? If you miss a dose, use it as soon as you can. If it is almost time for your next dose, use only that dose. Do not use double or extra doses. What may interact with this medicine? -anti-infectives like chloroquine and pentamidine -caffeine -cisapride -diuretics -medicines for colds -medicines for depression or for emotional or psychotic conditions -medicines for weight loss  including some herbal products -methadone -some antibiotics like clarithromycin, erythromycin, levofloxacin, and linezolid -some heart medicines -steroid hormones like dexamethasone, cortisone, hydrocortisone -theophylline -thyroid hormones This list may not describe all possible interactions. Give your health care provider a list of all the medicines, herbs, non-prescription drugs, or dietary supplements you use. Also tell them if you smoke, drink alcohol, or use illegal drugs. Some items may interact with your medicine. What should I watch for while using this medicine? Tell your doctor or health care professional if your symptoms do not improve. Do not use extra albuterol. If your asthma or bronchitis gets worse while you are using this medicine, call your doctor right away. If your mouth gets dry try chewing sugarless gum or sucking hard candy. Drink water as directed. What side effects may I notice from receiving this medicine? Side effects that you should report to your doctor or health care professional as soon as possible: -allergic reactions like skin rash, itching or hives, swelling of the face, lips, or tongue -breathing problems -chest pain -feeling faint or lightheaded, falls -high blood pressure -irregular heartbeat -fever -muscle cramps or weakness -pain, tingling, numbness in the hands or feet -vomiting Side effects that usually do not require medical attention (report to your doctor or health care professional if they continue or are bothersome): -cough -difficulty sleeping -headache -nervousness or trembling -stomach upset -stuffy or runny nose -throat irritation -unusual taste This list may not describe all possible side effects. Call your doctor for medical advice about side effects. You may report side effects to FDA at 1-800-FDA-1088. Where should I keep my medicine? Keep out of the reach of children. Store at  room temperature between 15 and 30 degrees C (59  and 86 degrees F). The contents are under pressure and may burst when exposed to heat or flame. Do not freeze. This medicine does not work as well if it is too cold. Throw away any unused medicine after the expiration date. Inhalers need to be thrown away after the labeled number of puffs have been used or by the expiration date; whichever comes first. Ventolin HFA should be thrown away 12 months after removing from foil pouch. Check the instructions that come with your medicine. NOTE: This sheet is a summary. It may not cover all possible information. If you have questions about this medicine, talk to your doctor, pharmacist, or health care provider.  2012, [employer] Standard. (08/03/2010 11:00:52 AM)Bronchitis Bronchitis is the body's way of reacting to injury and/or infection (inflammation) of the bronchi. Bronchi are the air tubes that extend from the windpipe into the lungs. If the inflammation becomes severe, it may cause shortness of breath. CAUSES  Inflammation may be caused by:  A virus.   Germs (bacteria).   Dust.   Allergens.   Pollutants and many other irritants.  The cells lining the bronchial tree are covered with tiny hairs (cilia). These constantly beat upward, away from the lungs, toward the mouth. This keeps the lungs free of pollutants. When these cells become too irritated and are unable to do their job, mucus begins to develop. This causes the characteristic cough of bronchitis. The cough clears the lungs when the cilia are unable to do their job. Without either of these protective mechanisms, the mucus would settle in the lungs. Then you would develop pneumonia. Smoking is a common cause of bronchitis and can contribute to pneumonia. Stopping this habit is the single most important thing you can do to help yourself. TREATMENT   Your caregiver may prescribe an antibiotic if the cough is caused by bacteria. Also, medicines that open up your airways make it easier to  breathe. Your caregiver may also recommend or prescribe an expectorant. It will loosen the mucus to be coughed up. Only take over-the-counter or prescription medicines for pain, discomfort, or fever as directed by your caregiver.   Removing whatever causes the problem (smoking, for example) is critical to preventing the problem from getting worse.   Cough suppressants may be prescribed for relief of cough symptoms.   Inhaled medicines may be prescribed to help with symptoms now and to help prevent problems from returning.   For those with recurrent (chronic) bronchitis, there may be a need for steroid medicines.  SEEK IMMEDIATE MEDICAL CARE IF:   During treatment, you develop more pus-like mucus (purulent sputum).   You have a fever.   Your baby is older than 3 months with a rectal temperature of 102 F (38.9 C) or higher.   Your baby is 36 months old or younger with a rectal temperature of 100.4 F (38 C) or higher.   You become progressively more ill.   You have increased difficulty breathing, wheezing, or shortness of breath.  It is necessary to seek immediate medical care if you are elderly or sick from any other disease. MAKE SURE YOU:   Understand these instructions.   Will watch your condition.   Will get help right away if you are not doing well or get worse.  Document Released: 03/18/2005 Document Revised: 03/07/2011 Document Reviewed: 01/26/2008 [employer] Patient Information 2012 [place], [place].

## 2011-11-28 ENCOUNTER — Emergency Department (HOSPITAL_COMMUNITY): Payer: Self-pay

## 2011-11-28 ENCOUNTER — Encounter (HOSPITAL_COMMUNITY): Payer: Self-pay | Admitting: *Deleted

## 2011-11-28 ENCOUNTER — Emergency Department (HOSPITAL_COMMUNITY)
Admission: EM | Admit: 2011-11-28 | Discharge: 2011-11-28 | Disposition: A | Discharge status: Home / Self Care | Payer: Self-pay | Attending: Emergency Medicine | Admitting: Emergency Medicine

## 2011-11-28 DIAGNOSIS — J40 Bronchitis, not specified as acute or chronic: Secondary | ICD-10-CM | POA: Insufficient documentation

## 2011-11-28 DIAGNOSIS — F17200 Nicotine dependence, unspecified, uncomplicated: Secondary | ICD-10-CM | POA: Insufficient documentation

## 2011-11-28 LAB — CBC WITH DIFFERENTIAL/PLATELET
Basophils Absolute: 0.1 10*3/uL (ref 0.0–0.1)
Basophils Relative: 1 % (ref 0–1)
Eosinophils Absolute: 0.4 10*3/uL (ref 0.0–0.7)
Eosinophils Relative: 3 % (ref 0–5)
HCT: 44.2 % (ref 36.0–46.0)
Hemoglobin: 14.8 g/dL (ref 12.0–15.0)
Lymphocytes Relative: 43 % (ref 12–46)
Lymphs Abs: 4.4 10*3/uL — ABNORMAL HIGH (ref 0.7–4.0)
MCH: 27.3 pg (ref 26.0–34.0)
MCHC: 33.5 g/dL (ref 30.0–36.0)
MCV: 81.5 fL (ref 78.0–100.0)
Monocytes Absolute: 0.5 10*3/uL (ref 0.1–1.0)
Monocytes Relative: 5 % (ref 3–12)
Neutro Abs: 4.9 10*3/uL (ref 1.7–7.7)
Neutrophils Relative %: 48 % (ref 43–77)
Platelets: 215 10*3/uL (ref 150–400)
RBC: 5.42 MIL/uL — ABNORMAL HIGH (ref 3.87–5.11)
RDW: 14.8 % (ref 11.5–15.5)
WBC: 10.3 10*3/uL (ref 4.0–10.5)

## 2011-11-28 MED ORDER — IPRATROPIUM BROMIDE 0.02 % IN SOLN
0.5000 mg | Freq: Once | RESPIRATORY_TRACT | Status: AC
  Administered 2011-11-28: 0.5 mg via RESPIRATORY_TRACT
  Filled 2011-11-28: qty 2.5

## 2011-11-28 MED ORDER — NICOTINE POLACRILEX 4 MG MT GUM: 4.0000 mg | CHEWING_GUM | OROMUCOSAL | Status: AC | PRN

## 2011-11-28 MED ORDER — AZITHROMYCIN 250 MG PO TABS: ORAL_TABLET | ORAL | Status: AC

## 2011-11-28 MED ORDER — NICOTINE 21 MG/24HR TD PT24: 1.0000 | MEDICATED_PATCH | TRANSDERMAL | Status: AC

## 2011-11-28 MED ORDER — ALBUTEROL SULFATE (5 MG/ML) 0.5% IN NEBU
5.0000 mg | INHALATION_SOLUTION | Freq: Once | RESPIRATORY_TRACT | Status: AC
  Administered 2011-11-28: 5 mg via RESPIRATORY_TRACT
  Filled 2011-11-28: qty 1

## 2011-11-28 MED ORDER — ALBUTEROL SULFATE HFA 108 (90 BASE) MCG/ACT IN AERS
2.0000 | INHALATION_SPRAY | RESPIRATORY_TRACT | Status: DC | PRN
  Administered 2011-11-28: 2 via RESPIRATORY_TRACT
  Filled 2011-11-28: qty 6.7

## 2011-11-28 NOTE — ED Notes (Signed)
PT with productive cough for several days.  Pt came in tonight b/c can't sleep d/t constant coughing and sob when she lays down.  Pt denies pain.

## 2011-11-28 NOTE — ED Provider Notes (Signed)
Medical screening examination/treatment/procedure(s) were performed by non-physician practitioner and as supervising physician I was immediately available for consultation/collaboration.  Gillis Boardley M Delawrence Fridman, MD 11/28/11 0612 

## 2011-11-28 NOTE — ED Provider Notes (Signed)
History     CSN: [id number]  Arrival date & time 11/28/11  0238   First MD Initiated Contact with Patient 11/28/11 [phone]      Chief Complaint  Patient presents with  . Shortness of Breath    when laying down  . Cough   HPI  History provided by the patient. Patient is a 46 year old African American female who is a current smoker and has past history of recurrent bronchitis infections who presents with several days of productive cough and shortness of breath symptoms. Symptoms are similar to previous bronchitis infections. Patient denies any fever, chills or sweats. She denies any significant nasal congestion or rhinorrhea. No sore throat. No episodes of nausea, vomiting, diarrhea. She denies any chest pain or abdominal discomfort. No hemoptysis. Patient has not taken any medications for symptoms. She denies any other aggravating or alleviating factors.   Past Medical History  Diagnosis Date  . Bronchitis     Past Surgical History  Procedure Date  . Abdominal hysterectomy   . Rotator cuff repair   . Cystectomy     L wrist    No family history on file.  History  Substance Use Topics  . Smoking status: Current Everyday Smoker -- 0.5 packs/day    Types: Cigarettes  . Smokeless tobacco: Not on file  . Alcohol Use: Yes     occasional    OB History    Grav Para Term Preterm Abortions TAB SAB Ect Mult Living                  Review of Systems  Constitutional: Negative for fever, chills and appetite change.  HENT: Negative for congestion, sore throat and rhinorrhea.   Respiratory: Positive for cough, shortness of breath and wheezing.   Cardiovascular: Negative for chest pain and palpitations.  Gastrointestinal: Negative for nausea, vomiting and abdominal pain.    Allergies  Meloxicam  Home Medications   Current Outpatient Rx  Name Route Sig Dispense Refill  . ALBUTEROL SULFATE HFA 108 (90 BASE) MCG/ACT IN AERS Inhalation Inhale 2 puffs into the lungs every 4  (four) hours as needed for wheezing. 1 Inhaler 0  . ASPIRIN EC 81 MG PO TBEC Oral Take 81 mg by mouth daily. Shoulder pain    . NAPROXEN 500 MG PO TABS Oral Take 500 mg by mouth 2 (two) times daily with a meal. Shoulder pain    . TRAMADOL HCL 50 MG PO TABS Oral Take 50 mg by mouth every 8 (eight) hours as needed. Shoulder pain      BP 122/77  Pulse 81  Temp 97.5 F (36.4 C) (Oral)  Resp 16  SpO2 97%  Physical Exam  Nursing note and vitals reviewed. Constitutional: She is oriented to person, place, and time. She appears well-developed and well-nourished. No distress.  HENT:  Head: Normocephalic.  Mouth/Throat: Oropharynx is clear and moist.  Neck: Normal range of motion. Neck supple.       No meningeal signs  Cardiovascular: Normal rate and regular rhythm.   Pulmonary/Chest: Effort normal. No respiratory distress. She has wheezes. She has no rales.  Abdominal: Soft. There is no tenderness. There is no rebound and no guarding.  Neurological: She is alert and oriented to person, place, and time.  Skin: Skin is warm and dry. No rash noted.  Psychiatric: She has a normal mood and affect. Her behavior is normal.    ED Course  Procedures   Results for orders placed during the hospital  encounter of 11/28/11  CBC WITH DIFFERENTIAL      Component Value Range   WBC 10.3  4.0 - 10.5 K/uL   RBC 5.42 (*) 3.87 - 5.11 MIL/uL   Hemoglobin 14.8  12.0 - 15.0 g/dL   HCT 16.1  09.6 - 04.5 %   MCV 81.5  78.0 - 100.0 fL   MCH 27.3  26.0 - 34.0 pg   MCHC 33.5  30.0 - 36.0 g/dL   RDW 40.9  81.1 - 91.4 %   Platelets 215  150 - 400 K/uL   Neutrophils Relative 48  43 - 77 %   Neutro Abs 4.9  1.7 - 7.7 K/uL   Lymphocytes Relative 43  12 - 46 %   Lymphs Abs 4.4 (*) 0.7 - 4.0 K/uL   Monocytes Relative 5  3 - 12 %   Monocytes Absolute 0.5  0.1 - 1.0 K/uL   Eosinophils Relative 3  0 - 5 %   Eosinophils Absolute 0.4  0.0 - 0.7 K/uL   Basophils Relative 1  0 - 1 %   Basophils Absolute 0.1  0.0 -  0.1 K/uL      Dg Chest 2 View  11/28/2011  *RADIOLOGY REPORT*  Clinical Data: Shortness of breath  CHEST - 2 VIEW  Comparison: 06/14/2011  Findings: Chronic bronchitic change.  Mild lung base opacities. Heart size and mediastinal contours within normal range.  No pleural effusion or pneumothorax.  No acute osseous finding.  IMPRESSION: Chronic bronchitic change is similar to prior.  Mild bibasilar opacities; atelectasis versus infiltrate.   Original Report Authenticated By: Waneta Martins, M.D.      1. Bronchitis       MDM  Patient seen and evaluated. Patient sitting in bed comfortably with normal respirations and O2 sats. Occasional cough.  CBC unremarkable. Chest x-ray shows similar chronic lung status type changes. Lungs with wheezing on exam. Albuterol breathing treatment given. Patient reports having improvements. Patient afebrile. Patient does express desire to attempt to quit smoking. We'll provide prescription for nicotine patch.      Angus Seller, [place] 11/28/11 [phone]

## 2012-03-04 ENCOUNTER — Encounter (HOSPITAL_COMMUNITY): Payer: Self-pay | Admitting: Emergency Medicine

## 2012-03-04 ENCOUNTER — Emergency Department (HOSPITAL_COMMUNITY)
Admission: EM | Admit: 2012-03-04 | Discharge: 2012-03-04 | Disposition: A | Discharge status: Home / Self Care | Payer: Worker's Compensation | Attending: Emergency Medicine | Admitting: Emergency Medicine

## 2012-03-04 DIAGNOSIS — Z8709 Personal history of other diseases of the respiratory system: Secondary | ICD-10-CM | POA: Insufficient documentation

## 2012-03-04 DIAGNOSIS — M545 Low back pain, unspecified: Secondary | ICD-10-CM | POA: Insufficient documentation

## 2012-03-04 DIAGNOSIS — Z79899 Other long term (current) drug therapy: Secondary | ICD-10-CM | POA: Insufficient documentation

## 2012-03-04 DIAGNOSIS — Z7982 Long term (current) use of aspirin: Secondary | ICD-10-CM | POA: Insufficient documentation

## 2012-03-04 DIAGNOSIS — F17200 Nicotine dependence, unspecified, uncomplicated: Secondary | ICD-10-CM | POA: Insufficient documentation

## 2012-03-04 DIAGNOSIS — Z87828 Personal history of other (healed) physical injury and trauma: Secondary | ICD-10-CM | POA: Insufficient documentation

## 2012-03-04 MED ORDER — OXYCODONE-ACETAMINOPHEN 5-325 MG PO TABS: 1.0000 | ORAL_TABLET | Freq: Four times a day (QID) | ORAL | Status: DC | PRN

## 2012-03-04 MED ORDER — CYCLOBENZAPRINE HCL 10 MG PO TABS: 10.0000 mg | ORAL_TABLET | Freq: Two times a day (BID) | ORAL | Status: DC | PRN
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
## 2012-03-04 NOTE — ED Provider Notes (Signed)
Medical screening examination/treatment/procedure(s) were performed by non-physician practitioner and as supervising physician I was immediately available for consultation/collaboration.  Spike Desilets, MD 03/04/12 1706 

## 2012-03-04 NOTE — ED Notes (Signed)
Pt reports acute low back pain after standing up from a siitting position yeaterday

## 2012-03-09 DIAGNOSIS — M5010 Cervical disc disorder with radiculopathy, unspecified cervical region: Secondary | ICD-10-CM
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
## 2012-03-10 ENCOUNTER — Encounter (HOSPITAL_COMMUNITY): Payer: Self-pay | Admitting: Pharmacy Technician

## 2012-03-13 ENCOUNTER — Encounter (HOSPITAL_COMMUNITY)
Admission: RE | Admit: 2012-03-13 | Discharge: 2012-03-13 | Disposition: A | Discharge status: Home / Self Care | Payer: Worker's Compensation | Source: Ambulatory Visit | Attending: Orthopedic Surgery | Admitting: Orthopedic Surgery

## 2012-03-13 ENCOUNTER — Encounter (HOSPITAL_COMMUNITY): Payer: Self-pay

## 2012-03-13 HISTORY — DX: Cardiac murmur, unspecified: R01.1

## 2012-03-13 LAB — CBC
HCT: 45.2 % (ref 36.0–46.0)
Hemoglobin: 15 g/dL (ref 12.0–15.0)
MCH: 27.1 pg (ref 26.0–34.0)
MCHC: 33.2 g/dL (ref 30.0–36.0)
MCV: 81.7 fL (ref 78.0–100.0)
Platelets: 204 10*3/uL (ref 150–400)
RBC: 5.53 MIL/uL — ABNORMAL HIGH (ref 3.87–5.11)
RDW: 15.4 % (ref 11.5–15.5)
WBC: 8 10*3/uL (ref 4.0–10.5)

## 2012-03-13 LAB — SURGICAL PCR SCREEN
MRSA, PCR: NEGATIVE
Staphylococcus aureus: POSITIVE — AB

## 2012-03-13 NOTE — Pre-Procedure Instructions (Signed)
20 Aletta Edmunds  03/13/2012   Your procedure is scheduled on:  Thursday, December 19th.  Report to [facility] at 5:30AM.  Call this number if you have problems the morning of surgery: [phone]   Remember:Nothing to eat or drink after Midnight.    Take these medicines the morning of surgery with A SIP OF WATER: May use inhaler.  Bring Albuterol inhaler in with you.  May to Cyclobenzaprine (Flexeril), Hydrocodone- Acetaminophen (Vicodin) or Oxycodone - Acetaminophen (Percocet) if needed.   Do not wear jewelry, make-up or nail polish.  Do not wear lotions, powders, or perfumes. You may wear deodorant.  Do not shave 48 hours prior to surgery. Men may shave face and neck.  Do not bring valuables to the hospital.  Contacts, dentures or bridgework may not be worn into surgery.  Leave suitcase in the car. After surgery it may be brought to your room.  For patients admitted to the hospital, checkout time is 11:00 AM the day of discharge.   Patients discharged the day of surgery will not be allowed to drive home.  Name and phone number of your driver:NA    Special Instructions: Shower using CHG 2 nights before surgery and the night before surgery.  If you shower the day of surgery use CHG.  Use special wash - you have one bottle of CHG for all showers.  You should use approximately 1/3 of the bottle for each shower.   Please read over the following fact sheets that you were given: Pain Booklet, Coughing and Deep Breathing and Surgical Site Infection Prevention

## 2012-03-18 MED ORDER — DEXAMETHASONE SODIUM PHOSPHATE 10 MG/ML IJ SOLN
10.0000 mg | Freq: Once | INTRAMUSCULAR | Status: AC
  Administered 2012-03-19: 10 mg via INTRAVENOUS
  Filled 2012-03-18 (×2): qty 1

## 2012-03-18 MED ORDER — ACETAMINOPHEN 10 MG/ML IV SOLN
1000.0000 mg | Freq: Once | INTRAVENOUS | Status: AC
  Administered 2012-03-19: 1000 mg via INTRAVENOUS
  Filled 2012-03-18: qty 100

## 2012-03-18 MED ORDER — CEFAZOLIN SODIUM-DEXTROSE 2-3 GM-% IV SOLR
2.0000 g | INTRAVENOUS | Status: AC
  Administered 2012-03-19: 2 g via INTRAVENOUS
  Filled 2012-03-18 (×2): qty 50

## 2012-03-19 ENCOUNTER — Observation Stay (HOSPITAL_COMMUNITY): Payer: Worker's Compensation

## 2012-03-19 ENCOUNTER — Encounter (HOSPITAL_COMMUNITY): Payer: Self-pay | Admitting: *Deleted

## 2012-03-19 ENCOUNTER — Encounter (HOSPITAL_COMMUNITY)
Admission: RE | Disposition: A | Discharge status: Home / Self Care | Payer: Self-pay | Source: Ambulatory Visit | Attending: Orthopedic Surgery

## 2012-03-19 ENCOUNTER — Observation Stay (HOSPITAL_COMMUNITY)
Admission: RE | Admit: 2012-03-19 | Discharge: 2012-03-20 | Disposition: A | Discharge status: Home / Self Care | Payer: Worker's Compensation | Source: Ambulatory Visit | Attending: Orthopedic Surgery | Admitting: Orthopedic Surgery

## 2012-03-19 ENCOUNTER — Ambulatory Visit (HOSPITAL_COMMUNITY): Payer: Worker's Compensation | Admitting: Anesthesiology

## 2012-03-19 ENCOUNTER — Ambulatory Visit (HOSPITAL_COMMUNITY): Payer: Worker's Compensation

## 2012-03-19 ENCOUNTER — Encounter (HOSPITAL_COMMUNITY): Payer: Self-pay | Admitting: Anesthesiology

## 2012-03-19 DIAGNOSIS — Z01812 Encounter for preprocedural laboratory examination: Secondary | ICD-10-CM | POA: Insufficient documentation

## 2012-03-19 DIAGNOSIS — M47812 Spondylosis without myelopathy or radiculopathy, cervical region: Principal | ICD-10-CM | POA: Insufficient documentation

## 2012-03-19 DIAGNOSIS — F17200 Nicotine dependence, unspecified, uncomplicated: Secondary | ICD-10-CM | POA: Insufficient documentation

## 2012-03-19 DIAGNOSIS — Z79899 Other long term (current) drug therapy: Secondary | ICD-10-CM | POA: Insufficient documentation

## 2012-03-19 DIAGNOSIS — M5010 Cervical disc disorder with radiculopathy, unspecified cervical region: Secondary | ICD-10-CM

## 2012-03-19 DIAGNOSIS — J4489 Other specified chronic obstructive pulmonary disease: Secondary | ICD-10-CM | POA: Insufficient documentation

## 2012-03-19 DIAGNOSIS — J449 Chronic obstructive pulmonary disease, unspecified: Secondary | ICD-10-CM | POA: Insufficient documentation

## 2012-03-19 DIAGNOSIS — M5020 Other cervical disc displacement, unspecified cervical region: Secondary | ICD-10-CM | POA: Insufficient documentation

## 2012-03-19 HISTORY — PX: ANTERIOR CERVICAL DECOMP/DISCECTOMY FUSION: SHX1161

## 2012-03-19 SURGERY — ANTERIOR CERVICAL DECOMPRESSION/DISCECTOMY FUSION 1 LEVEL
Anesthesia: General | Site: Neck | Laterality: Left | Wound class: Clean

## 2012-03-19 MED ORDER — FENTANYL CITRATE 0.05 MG/ML IJ SOLN
INTRAMUSCULAR | Status: DC | PRN
  Administered 2012-03-19 (×2): 100 ug via INTRAVENOUS
  Administered 2012-03-19 (×3): 50 ug via INTRAVENOUS

## 2012-03-19 MED ORDER — FENTANYL CITRATE 0.05 MG/ML IJ SOLN: 50.0000 ug | Freq: Once | INTRAMUSCULAR | Status: DC

## 2012-03-19 MED ORDER — DEXAMETHASONE 4 MG PO TABS
4.0000 mg | ORAL_TABLET | Freq: Four times a day (QID) | ORAL | Status: DC
  Administered 2012-03-19 – 2012-03-20 (×3): 4 mg via ORAL
  Filled 2012-03-19 (×10): qty 1

## 2012-03-19 MED ORDER — ONDANSETRON HCL 4 MG/2ML IJ SOLN
INTRAMUSCULAR | Status: DC | PRN
  Administered 2012-03-19: 4 mg via INTRAVENOUS

## 2012-03-19 MED ORDER — ONDANSETRON HCL 4 MG/2ML IJ SOLN: 4.0000 mg | INTRAMUSCULAR | Status: DC | PRN

## 2012-03-19 MED ORDER — ACETAMINOPHEN 10 MG/ML IV SOLN
INTRAVENOUS | Status: AC
  Filled 2012-03-19: qty 100

## 2012-03-19 MED ORDER — GLYCOPYRROLATE 0.2 MG/ML IJ SOLN
INTRAMUSCULAR | Status: DC | PRN
  Administered 2012-03-19: 0.6 mg via INTRAVENOUS

## 2012-03-19 MED ORDER — MORPHINE SULFATE 2 MG/ML IJ SOLN: 1.0000 mg | INTRAMUSCULAR | Status: DC | PRN

## 2012-03-19 MED ORDER — ONDANSETRON HCL 4 MG PO TABS: 4.0000 mg | ORAL_TABLET | Freq: Three times a day (TID) | ORAL | Status: DC | PRN

## 2012-03-19 MED ORDER — MIDAZOLAM HCL 5 MG/5ML IJ SOLN
INTRAMUSCULAR | Status: DC | PRN
  Administered 2012-03-19: 2 mg via INTRAVENOUS

## 2012-03-19 MED ORDER — ARTIFICIAL TEARS OP OINT
TOPICAL_OINTMENT | OPHTHALMIC | Status: DC | PRN
  Administered 2012-03-19: 1 via OPHTHALMIC

## 2012-03-19 MED ORDER — HYDROMORPHONE HCL PF 1 MG/ML IJ SOLN
INTRAMUSCULAR | Status: AC
  Filled 2012-03-19: qty 1

## 2012-03-19 MED ORDER — LACTATED RINGERS IV SOLN
INTRAVENOUS | Status: DC | PRN
  Administered 2012-03-19: 07:00:00 via INTRAVENOUS

## 2012-03-19 MED ORDER — MENTHOL 3 MG MT LOZG
1.0000 | LOZENGE | OROMUCOSAL | Status: DC | PRN
  Filled 2012-03-19: qty 9

## 2012-03-19 MED ORDER — ALBUTEROL SULFATE HFA 108 (90 BASE) MCG/ACT IN AERS
2.0000 | INHALATION_SPRAY | RESPIRATORY_TRACT | Status: DC | PRN
  Filled 2012-03-19: qty 6.7

## 2012-03-19 MED ORDER — DEXAMETHASONE SODIUM PHOSPHATE 4 MG/ML IJ SOLN
4.0000 mg | Freq: Four times a day (QID) | INTRAMUSCULAR | Status: DC
  Administered 2012-03-19: 4 mg via INTRAVENOUS
  Filled 2012-03-19 (×8): qty 1

## 2012-03-19 MED ORDER — ZOLPIDEM TARTRATE 5 MG PO TABS: 5.0000 mg | ORAL_TABLET | Freq: Every evening | ORAL | Status: DC | PRN

## 2012-03-19 MED ORDER — CYCLOBENZAPRINE HCL 10 MG PO TABS
10.0000 mg | ORAL_TABLET | Freq: Two times a day (BID) | ORAL | Status: DC | PRN
  Administered 2012-03-19: 10 mg via ORAL
  Filled 2012-03-19: qty 1

## 2012-03-19 MED ORDER — ACETAMINOPHEN 10 MG/ML IV SOLN
1000.0000 mg | Freq: Four times a day (QID) | INTRAVENOUS | Status: AC
  Administered 2012-03-19 – 2012-03-20 (×4): 1000 mg via INTRAVENOUS
  Filled 2012-03-19 (×5): qty 100

## 2012-03-19 MED ORDER — VECURONIUM BROMIDE 10 MG IV SOLR
INTRAVENOUS | Status: DC | PRN
  Administered 2012-03-19: 1 mg via INTRAVENOUS
  Administered 2012-03-19: 2 mg via INTRAVENOUS
  Administered 2012-03-19: 1 mg via INTRAVENOUS

## 2012-03-19 MED ORDER — SODIUM CHLORIDE 0.9 % IV SOLN: 250.0000 mL | INTRAVENOUS | Status: DC

## 2012-03-19 MED ORDER — PROPOFOL 10 MG/ML IV BOLUS
INTRAVENOUS | Status: DC | PRN
  Administered 2012-03-19: 180 mg via INTRAVENOUS

## 2012-03-19 MED ORDER — OXYCODONE HCL 5 MG PO TABS
10.0000 mg | ORAL_TABLET | ORAL | Status: DC | PRN
  Administered 2012-03-19 (×2): 10 mg via ORAL
  Filled 2012-03-19 (×2): qty 2

## 2012-03-19 MED ORDER — HYDROMORPHONE HCL PF 1 MG/ML IJ SOLN
0.2500 mg | INTRAMUSCULAR | Status: DC | PRN
  Administered 2012-03-19 (×4): 0.5 mg via INTRAVENOUS

## 2012-03-19 MED ORDER — LACTATED RINGERS IV SOLN: INTRAVENOUS | Status: DC

## 2012-03-19 MED ORDER — THROMBIN 20000 UNITS EX SOLR
CUTANEOUS | Status: DC | PRN
  Administered 2012-03-19: 09:00:00 via TOPICAL

## 2012-03-19 MED ORDER — NEOSTIGMINE METHYLSULFATE 1 MG/ML IJ SOLN
INTRAMUSCULAR | Status: DC | PRN
  Administered 2012-03-19: 5 mg via INTRAVENOUS

## 2012-03-19 MED ORDER — SODIUM CHLORIDE 0.9 % IJ SOLN: 3.0000 mL | INTRAMUSCULAR | Status: DC | PRN

## 2012-03-19 MED ORDER — PROMETHAZINE HCL 25 MG/ML IJ SOLN: 6.2500 mg | INTRAMUSCULAR | Status: DC | PRN

## 2012-03-19 MED ORDER — MIDAZOLAM HCL 2 MG/2ML IJ SOLN: 1.0000 mg | INTRAMUSCULAR | Status: DC | PRN

## 2012-03-19 MED ORDER — 0.9 % SODIUM CHLORIDE (POUR BTL) OPTIME
TOPICAL | Status: DC | PRN
  Administered 2012-03-19: 1000 mL

## 2012-03-19 MED ORDER — BUPIVACAINE-EPINEPHRINE PF 0.25-1:200000 % IJ SOLN
INTRAMUSCULAR | Status: AC
  Filled 2012-03-19: qty 30

## 2012-03-19 MED ORDER — SODIUM CHLORIDE 0.9 % IJ SOLN
3.0000 mL | Freq: Two times a day (BID) | INTRAMUSCULAR | Status: DC
  Administered 2012-03-19: 3 mL via INTRAVENOUS

## 2012-03-19 MED ORDER — ROCURONIUM BROMIDE 100 MG/10ML IV SOLN
INTRAVENOUS | Status: DC | PRN
  Administered 2012-03-19: 50 mg via INTRAVENOUS

## 2012-03-19 MED ORDER — THROMBIN 20000 UNITS EX SOLR
CUTANEOUS | Status: AC
  Filled 2012-03-19: qty 20000

## 2012-03-19 MED ORDER — CEFAZOLIN SODIUM 1-5 GM-% IV SOLN
1.0000 g | Freq: Three times a day (TID) | INTRAVENOUS | Status: AC
  Administered 2012-03-19 – 2012-03-20 (×2): 1 g via INTRAVENOUS
  Filled 2012-03-19 (×3): qty 50

## 2012-03-19 MED ORDER — LIDOCAINE HCL (CARDIAC) 20 MG/ML IV SOLN
INTRAVENOUS | Status: DC | PRN
  Administered 2012-03-19: 80 mg via INTRAVENOUS

## 2012-03-19 MED ORDER — DOCUSATE SODIUM 100 MG PO CAPS
100.0000 mg | ORAL_CAPSULE | Freq: Two times a day (BID) | ORAL | Status: DC
  Administered 2012-03-19 – 2012-03-20 (×3): 100 mg via ORAL
  Filled 2012-03-19 (×2): qty 1

## 2012-03-19 MED ORDER — BUPIVACAINE-EPINEPHRINE 0.25% -1:200000 IJ SOLN
INTRAMUSCULAR | Status: DC | PRN
  Administered 2012-03-19: 3 mL

## 2012-03-19 MED ORDER — PHENOL 1.4 % MT LIQD: 1.0000 | OROMUCOSAL | Status: DC | PRN

## 2012-03-19 MED ORDER — HYDROMORPHONE HCL PF 1 MG/ML IJ SOLN
INTRAMUSCULAR | Status: AC
  Administered 2012-03-19: 0.5 mg via INTRAVENOUS
  Filled 2012-03-19: qty 1

## 2012-03-19 SURGICAL SUPPLY — 57 items
BIT MILLING PRODISC 2.0 STER (BIT) ×2 IMPLANT
BLADE SURG ROTATE 9660 (MISCELLANEOUS) IMPLANT
BUR EGG ELITE 4.0 (BURR) IMPLANT
BUR MATCHSTICK NEURO 3.0 LAGG (BURR) IMPLANT
CANISTER SUCTION 2500CC (MISCELLANEOUS) ×2 IMPLANT
CLOTH BEACON ORANGE TIMEOUT ST (SAFETY) ×2 IMPLANT
CLSR STERI-STRIP ANTIMIC 1/2X4 (GAUZE/BANDAGES/DRESSINGS) ×2 IMPLANT
COLLAR CERV LO CONTOUR FIRM DE (SOFTGOODS) ×2 IMPLANT
CORDS BIPOLAR (ELECTRODE) ×2 IMPLANT
COVER MAYO STAND STRL (DRAPES) ×6 IMPLANT
COVER SURGICAL LIGHT HANDLE (MISCELLANEOUS) ×4 IMPLANT
CRADLE DONUT ADULT HEAD (MISCELLANEOUS) ×2 IMPLANT
DERMABOND ADVANCED (GAUZE/BANDAGES/DRESSINGS) ×1
DERMABOND ADVANCED .7 DNX12 (GAUZE/BANDAGES/DRESSINGS) ×1 IMPLANT
DISC PRODISC-C MED DEEP 6MM (Neuro Prosthesis/Implant) ×2 IMPLANT
DRAPE C-ARM 42X72 X-RAY (DRAPES) ×2 IMPLANT
DRAPE POUCH INSTRU U-SHP 10X18 (DRAPES) ×2 IMPLANT
DRAPE SURG 17X23 STRL (DRAPES) ×2 IMPLANT
DRAPE U-SHAPE 47X51 STRL (DRAPES) ×2 IMPLANT
DRSG MEPILEX BORDER 4X4 (GAUZE/BANDAGES/DRESSINGS) ×2 IMPLANT
DURAPREP 26ML APPLICATOR (WOUND CARE) ×2 IMPLANT
ELECT COATED BLADE 2.86 ST (ELECTRODE) ×2 IMPLANT
ELECT REM PT RETURN 9FT ADLT (ELECTROSURGICAL) ×2
ELECTRODE REM PT RTRN 9FT ADLT (ELECTROSURGICAL) ×1 IMPLANT
GLOVE BIOGEL PI IND STRL 6.5 (GLOVE) IMPLANT
GLOVE BIOGEL PI IND STRL 8.5 (GLOVE) ×1 IMPLANT
GLOVE BIOGEL PI INDICATOR 6.5 (GLOVE)
GLOVE BIOGEL PI INDICATOR 8.5 (GLOVE) ×1
GLOVE ECLIPSE 6.0 STRL STRAW (GLOVE) IMPLANT
GLOVE ECLIPSE 8.5 STRL (GLOVE) ×2 IMPLANT
GOWN PREVENTION PLUS XXLARGE (GOWN DISPOSABLE) ×2 IMPLANT
GOWN STRL NON-REIN LRG LVL3 (GOWN DISPOSABLE) ×2 IMPLANT
INSERTER TIP F/MED & MED DEEP ×2 IMPLANT
KIT BASIN OR (CUSTOM PROCEDURE TRAY) ×2 IMPLANT
KIT ROOM TURNOVER OR (KITS) ×2 IMPLANT
NEEDLE SPNL 18GX3.5 QUINCKE PK (NEEDLE) ×2 IMPLANT
NS IRRIG 1000ML POUR BTL (IV SOLUTION) ×2 IMPLANT
PACK ORTHO CERVICAL (CUSTOM PROCEDURE TRAY) ×2 IMPLANT
PACK UNIVERSAL I (CUSTOM PROCEDURE TRAY) ×2 IMPLANT
PAD ARMBOARD 7.5X6 YLW CONV (MISCELLANEOUS) ×4 IMPLANT
PATTIES SURGICAL .25X.25 (GAUZE/BANDAGES/DRESSINGS) IMPLANT
PRODISC-C RETAINER SCREWS 14MM ×4 IMPLANT
SPONGE INTESTINAL PEANUT (DISPOSABLE) ×2 IMPLANT
SPONGE SURGIFOAM ABS GEL 100 (HEMOSTASIS) ×2 IMPLANT
STRIP CLOSURE SKIN 1/2X4 (GAUZE/BANDAGES/DRESSINGS) ×2 IMPLANT
SURGIFLO TRUKIT (HEMOSTASIS) IMPLANT
SUT MNCRL AB 3-0 PS2 18 (SUTURE) ×2 IMPLANT
SUT SILK 2 0 (SUTURE) ×1
SUT SILK 2-0 18XBRD TIE 12 (SUTURE) ×1 IMPLANT
SUT VIC AB 2-0 CT1 18 (SUTURE) ×2 IMPLANT
SYR BULB IRRIGATION 50ML (SYRINGE) ×2 IMPLANT
SYR CONTROL 10ML LL (SYRINGE) ×2 IMPLANT
TAPE CLOTH 4X10 WHT NS (GAUZE/BANDAGES/DRESSINGS) ×2 IMPLANT
TAPE UMBILICAL COTTON 1/8X30 (MISCELLANEOUS) ×2 IMPLANT
TOWEL OR 17X24 6PK STRL BLUE (TOWEL DISPOSABLE) ×2 IMPLANT
TOWEL OR 17X26 10 PK STRL BLUE (TOWEL DISPOSABLE) ×2 IMPLANT
WATER STERILE IRR 1000ML POUR (IV SOLUTION) IMPLANT

## 2012-03-19 NOTE — Brief Op Note (Signed)
03/19/2012  10:05 AM  PATIENT:  Alisha Espinoza  46 y.o. female  PRE-OPERATIVE DIAGNOSIS:  C4-5 Left HNP  POST-OPERATIVE DIAGNOSIS:  C4-5 Left HNP  PROCEDURE:  Procedure(s) (LRB) with comments: ANTERIOR CERVICAL DECOMPRESSION/DISCECTOMY FUSION 1 LEVEL (Left) - Total Disc Replacement C4-5  SURGEON:  Surgeon(s) and Role:    * Venita Lick, MD - Primary  PHYSICIAN ASSISTANT:   ASSISTANTS: none   ANESTHESIA:   general  EBL:  Total I/O In: 1600 [I.V.:1600] Out: 35 [Blood:35]  BLOOD ADMINISTERED:none  DRAINS: none   LOCAL MEDICATIONS USED:  MARCAINE     SPECIMEN:  No Specimen  DISPOSITION OF SPECIMEN:  N/A  COUNTS:  YES  TOURNIQUET:  * No tourniquets in log *  DICTATION: .Other Dictation: Dictation Number [id number]  PLAN OF CARE: Admit for overnight observation  PATIENT DISPOSITION:  PACU - hemodynamically stable.

## 2012-03-19 NOTE — H&P (Signed)
No change in clinical exam H+P reviewed  

## 2012-03-19 NOTE — Anesthesia Postprocedure Evaluation (Signed)
  Anesthesia Post-op Note  Patient: Alisha Espinoza  Procedure(s) Performed: Procedure(s) (LRB) with comments: ANTERIOR CERVICAL DECOMPRESSION/DISCECTOMY FUSION 1 LEVEL (Left) - Total Disc Replacement C4-5  Patient Location: PACU  Anesthesia Type:General  Level of Consciousness: awake and alert   Airway and Oxygen Therapy: Patient Spontanous Breathing  Post-op Pain: mild  Post-op Assessment: Post-op Vital signs reviewed, Patient's Cardiovascular Status Stable, Respiratory Function Stable, Patent Airway, No signs of Nausea or vomiting and Pain level controlled  Post-op Vital Signs: stable  Complications: No apparent anesthesia complications

## 2012-03-19 NOTE — Transfer of Care (Signed)
Immediate Anesthesia Transfer of Care Note  Patient: Alisha Espinoza  Procedure(s) Performed: Procedure(s) (LRB) with comments: ANTERIOR CERVICAL DECOMPRESSION/DISCECTOMY FUSION 1 LEVEL (Left) - Total Disc Replacement C4-5  Patient Location: PACU  Anesthesia Type:General  Level of Consciousness: awake, alert , oriented and patient cooperative  Airway & Oxygen Therapy: Patient Spontanous Breathing and Patient connected to nasal cannula oxygen  Post-op Assessment: Report given to PACU RN, Post -op Vital signs reviewed and stable and Patient moving all extremities X 4  Post vital signs: Reviewed and stable  Complications: No apparent anesthesia complications

## 2012-03-19 NOTE — Op Note (Signed)
Alisha Espinoza, Alisha Espinoza               ACCOUNT NO.:  [id number]  MEDICAL RECORD NO.:  [id number]  LOCATION:  5N24C                        FACILITY:  [facility]  PHYSICIAN:  Alvy Beal, MD    DATE OF BIRTH:  11-18-1965  DATE OF PROCEDURE:  03/19/2012 DATE OF DISCHARGE:                              OPERATIVE REPORT   PREOPERATIVE DIAGNOSIS:  Cervical spondylitic radiculopathy, C4-5.  POSTOPERATIVE DIAGNOSIS:  Cervical spondylitic radiculopathy, C4-5.  OPERATIVE PROCEDURE:  Implantation of cervical total disk arthroplasty, C4-5.  COMPLICATIONS:  None.  CONDITION:  Stable.  INSTRUMENTATION SYSTEM USED:  Synthes ProDisc-C.  HISTORY:  This is a very pleasant woman who has been having significant debilitating neck and left arm pain.  After failed attempts of conservative management, she elected to proceed with surgery.  All appropriate risks and benefits were discussed with the patient and she expressed an understanding of these.  At this point, we elected to proceed with surgery.  OPERATIVE NOTE:  The patient was brought to the operating room and placed supine on the operating table.  After successful induction of general anesthesia and endotracheal intubation, TEDs, SCDs were placed. Towels were placed between the shoulder blades and the anterior cervical spine was prepped and draped in a standard fashion.  Time-out was done confirming patient, procedure, and all other pertinent important data. Once this was completed, x-ray was used to identify the incision site over the C4-5 disk space.  The planned incision site was infiltrated with 0.25% Marcaine and then the left-sided transverse incision was made starting at the midline.  Sharp dissection was carried out down to the platysma and the platysma was sharply incised.  I identified the internervous plane between the sternocleidomastoid and the omohyoid and began dissecting sharply in this plane.  I swept the trachea, esophagus, and  the omohyoid medially and protected with a finger, identified the carotid sheath laterally and protected that with a finger.  I then continued my sharp dissection through the deep cervical and prevertebral fascia until I exposed the anterior longitudinal ligament.  I then placed a retractor to protect the trachea and esophagus, and then placed a needle into the 4-5 disk space.  X-ray confirmed that I was at the appropriate level.  Once this was done, I then used the bipolar electrocautery to mobilize the longus coli muscles from the midbody of C4 to the midbody of C5 bilaterally.  I then placed self-retaining retractors underneath the longus coli muscle, deflated the endotracheal cuff, expanded the retractor and then locked it into place.  I performed an annulotomy with the 15-blade scalpel and then used a combination of pituitary rongeurs, curettes, and Kerrison rongeurs to remove the bulk of the C4-5 disk space.  I then used a 2 and 3-mm Kerrison to resect the overhanging osteophyte from the inferior aspect of the C4 vertebral body.  I now cleared excellent visualization of the 4-5 disk space.  Using live fluoro, I identified the midline of the cervical body of C4 and C5 based off of the spinous process.  This was in the AP plane.  I then placed a distraction pin parallel to the disk space in the midline at C4 and  C5.  I then placed the distractor over the distraction pins and then distracted the intervertebral bodies with the distractor and maintained that with the distracting pins.  I then continued my dissection posteriorly using a fine nerve hook and 1 mm Kerrison and a fine curette.  I was able to eventually go down to the posterior longitudinal ligament.  Using the nerve hook, I developed a plane underneath the posterior longitudinal ligament and then used my 1- mm Kerrison to resect the posterior longitudinal ligament to expose the underlying thecal sac.  I did remove fragments  of disk material on the left-hand side.  At this point, I could clearly run my nerve hook underneath the vertebral bodies of C4 and C5 and out under the uncovertebral joint, and I had removed all compressive lesions.  At this point, I then trialed the 6 medium and 5 medium total ProDisc spacer and I felt as though the 5 produced too much gap between the endplate and the trial.  I then elected to use the 6, which did not overstuff the compartment.  There was still some lucency between the trial and the endplate, but it was not as significant as of the 5.  At this point, I felt this was the better fit.  I then secured it into position, confirmed the midline again in the AP plane, and then created my fin cuts using the router.  I then cleaned fin cuts, irrigated the wound copiously with normal saline to ensure that I was free, the all debris was removed.  I then obtained the 6 medium ProDisc-C and malleted to the appropriate depth.  Under live fluoro, I took the neck through a range of motion and it functioned excellent.  I then removed the distraction pins and then sealed the holes with bone wax and sealed the fins with bone wax.  I then made sure I had complete hemostasis using bipolar electrocautery.  I removed the distraction pins and returned the esophagus and trachea to midline.  I then closed the platysma with interrupted 2-0 Vicryl sutures and a 3-0 Monocryl for the skin.  Steri- Strips, dry dressing, and a soft collar were applied.  The patient was extubated and transferred to the PACU without incident.  At the end of the case, all needle and sponge counts were correct.     Alvy Beal, MD     DDB/MEDQ  D:  03/19/2012  T:  03/19/2012  Job:  [id number]

## 2012-03-19 NOTE — Anesthesia Preprocedure Evaluation (Addendum)
Anesthesia Evaluation  Patient identified by MRN, date of birth, ID band Patient awake    Reviewed: Allergy & Precautions, H&P , NPO status , Patient's Chart, lab work & pertinent test results  Airway Mallampati: I TM Distance: >3 FB Neck ROM: Full    Dental  (+) Teeth Intact and Dental Advisory Given   Pulmonary COPD COPD inhaler, Current Smoker,  breath sounds clear to auscultation        Cardiovascular Rhythm:Regular Rate:Normal     Neuro/Psych    GI/Hepatic   Endo/Other    Renal/GU      Musculoskeletal   Abdominal (+) + obese,   Peds  Hematology   Anesthesia Other Findings   Reproductive/Obstetrics                          Anesthesia Physical Anesthesia Plan  ASA: II  Anesthesia Plan: General   Post-op Pain Management:    Induction: Intravenous  Airway Management Planned: Oral ETT  Additional Equipment:   Intra-op Plan:   Post-operative Plan: Extubation in OR  Informed Consent: I have reviewed the patients History and Physical, chart, labs and discussed the procedure including the risks, benefits and alternatives for the proposed anesthesia with the patient or authorized representative who has indicated his/her understanding and acceptance.     Plan Discussed with: CRNA and Surgeon  Anesthesia Plan Comments:         Anesthesia Quick Evaluation

## 2012-03-20 MED ORDER — INFLUENZA VIRUS VACC SPLIT PF IM SUSP
0.5000 mL | INTRAMUSCULAR | Status: AC
  Administered 2012-03-20: 0.5 mL via INTRAMUSCULAR
  Filled 2012-03-20: qty 0.5

## 2012-03-20 MED ORDER — OXYCODONE-ACETAMINOPHEN 10-325 MG PO TABS: 1.0000 | ORAL_TABLET | ORAL | Status: DC | PRN

## 2012-03-20 MED ORDER — DOCUSATE SODIUM 100 MG PO CAPS: 100.0000 mg | ORAL_CAPSULE | Freq: Two times a day (BID) | ORAL | Status: DC

## 2012-03-20 MED ORDER — ONDANSETRON HCL 4 MG PO TABS: 4.0000 mg | ORAL_TABLET | Freq: Three times a day (TID) | ORAL | Status: DC | PRN

## 2012-03-20 NOTE — Evaluation (Signed)
Physical Therapy Evaluation Patient Details Name: Alisha Espinoza MRN: [id number] DOB: 10-11-1965 Today's Date: 03/20/2012 Time: 4098-1191 PT Time Calculation (min): 15 min  PT Assessment / Plan / Recommendation Clinical Impression  Pt is a 46 y/o female s/p cervical disk replacement surgery who presents with independence with all mobility.       PT Assessment  Patent does not need any further PT services    Follow Up Recommendations  No PT follow up    Does the patient have the potential to tolerate intense rehabilitation      Barriers to Discharge        Equipment Recommendations  None recommended by PT    Recommendations for Other Services     Frequency      Precautions / Restrictions Precautions Precautions: Cervical Required Braces or Orthoses: Cervical Brace Cervical Brace: Soft collar;Applied in sitting position Restrictions Weight Bearing Restrictions: No Other Position/Activity Restrictions: Educated pt in use of soft collar. No ROM restrictions    Pertinent Vitals/Pain No c/o pain.        Mobility  Bed Mobility Bed Mobility: Supine to Sit;Sit to Supine Supine to Sit: 7: Independent Sit to Supine: 7: Independent Transfers Transfers: Sit to Stand;Stand to Sit Sit to Stand: 7: Independent Stand to Sit: 7: Independent Ambulation/Gait Ambulation/Gait Assistance: 7: Independent Ambulation Distance (Feet): 200 Feet Assistive device: None Stairs: Yes Stairs Assistance: 7: Independent Stair Management Technique: One rail Right;Forwards Number of Stairs: 13  Wheelchair Mobility Wheelchair Mobility: No    Shoulder Instructions     Exercises     PT Diagnosis:    PT Problem List:   PT Treatment Interventions:     PT Goals Acute Rehab PT Goals PT Goal Formulation: With patient  Visit Information  Last PT Received On: 03/20/12    Subjective Data  Subjective: Agree to PT eval   Prior Functioning  Home Living Lives With: Alone Available Help at  Discharge: Family;Available PRN/intermittently Type of Home: Apartment Home Access: Level entry Home Layout: One level Bathroom Shower/Tub: Engineer, manufacturing systems: Standard Home Adaptive Equipment: None Prior Function Level of Independence: Independent Able to Take Stairs?: Yes Driving: Yes Vocation: Full time employment Communication Communication: No difficulties Dominant Hand: Right    Cognition  Overall Cognitive Status: Appears within functional limits for tasks assessed/performed Arousal/Alertness: Awake/alert Orientation Level: Appears intact for tasks assessed Behavior During Session: [facility] for tasks performed    Extremity/Trunk Assessment Right Lower Extremity Assessment RLE ROM/Strength/Tone: Within functional levels Left Lower Extremity Assessment LLE ROM/Strength/Tone: Within functional levels   Balance    End of Session PT - End of Session Equipment Utilized During Treatment: Gait belt;Cervical collar Activity Tolerance: Patient tolerated treatment well Patient left: in chair;with call bell/phone within reach Nurse Communication: Mobility status  GP Functional Assessment Tool Used: clinical judgement Functional Limitation: Mobility: Walking and moving around Mobility: Walking and Moving Around Current Status [phone]): 0 percent impaired, limited or restricted Mobility: Walking and Moving Around Goal Status [phone]): 0 percent impaired, limited or restricted Mobility: Walking and Moving Around Discharge Status [phone]): 0 percent impaired, limited or restricted   Zeba Luby 03/20/2012, 4:47 PM  Mckinzee Spirito L. Sera Hitsman DPT [phone]

## 2012-03-20 NOTE — Discharge Summary (Signed)
Patient ID: Alisha Espinoza MRN: [id number] DOB/AGE: March 29, 1966 46 y.o.  Admit date: 03/19/2012 Discharge date: 03/20/2012  Admission Diagnoses:  Principal Problem:  *Cervical disc disorder with radiculopathy   Discharge Diagnoses:  Principal Problem:  *Cervical disc disorder with radiculopathy  status post Procedure(s): ANTERIOR CERVICAL DECOMPRESSION/DISCECTOMY FUSION 1 LEVEL  Past Medical History  Diagnosis Date  . Bronchitis   . Heart murmur     "closed by age 85."    Surgeries: Procedure(s): ANTERIOR CERVICAL DECOMPRESSION/DISCECTOMY FUSION 1 LEVEL on 03/19/2012   Consultants:  none  Discharged Condition: Improved  Hospital Course: Alisha Espinoza is an 46 y.o. female who was admitted 03/19/2012 for operative treatment of Cervical disc disorder with radiculopathy. Patient failed conservative treatments (please see the history and physical for the specifics) and had severe unremitting pain that affects sleep, daily activities and work/hobbies. After pre-op clearance, the patient was taken to the operating room on 03/19/2012 and underwent  Procedure(s): ANTERIOR CERVICAL DECOMPRESSION/DISCECTOMY FUSION 1 LEVEL.    Patient was given perioperative antibiotics: Anti-infectives     Start     Dose/Rate Route Frequency Ordered Stop   03/19/12 1600   ceFAZolin (ANCEF) IVPB 1 g/50 mL premix        1 g 100 mL/hr over 30 Minutes Intravenous Every 8 hours 03/19/12 1311 03/20/12 0109   03/18/12 1444   ceFAZolin (ANCEF) IVPB 2 g/50 mL premix        2 g 100 mL/hr over 30 Minutes Intravenous 30 min pre-op 03/18/12 1444 03/19/12 0746           Patient was given sequential compression devices and early ambulation to prevent DVT.   Patient benefited maximally from hospital stay and there were no complications. At the time of discharge, the patient was urinating/moving their bowels without difficulty, tolerating a regular diet, pain is controlled with oral pain medications and  they have been cleared by PT/OT.   Recent vital signs: Patient Vitals for the past 24 hrs:  BP Temp Temp src Pulse Resp SpO2  03/20/12 0608 131/64 mmHg 98.4 F (36.9 C) - 95  18  98 %  03/20/12 0214 130/78 mmHg 97.9 F (36.6 C) - 70  18  98 %  03/19/12 2135 135/95 mmHg 98 F (36.7 C) Oral 100  18  99 %  03/19/12 1745 121/66 mmHg 98.1 F (36.7 C) - 67  20  100 %  03/19/12 1513 116/72 mmHg 98.1 F (36.7 C) - 76  20  95 %  03/19/12 1300 125/78 mmHg - - 77  20  94 %  03/19/12 1230 - 97.6 F (36.4 C) - - - -  03/19/12 1215 - - - - - 97 %  03/19/12 1200 - - - - - 98 %  03/19/12 1100 115/99 mmHg - - 74  16  99 %  03/19/12 1030 133/98 mmHg - - 78  20  95 %  03/19/12 1015 108/70 mmHg - - 94  22  96 %  03/19/12 1000 108/70 mmHg 97.6 F (36.4 C) - 92  26  96 %     Recent laboratory studies: No results found for this basename: WBC:2,HGB:2,HCT:2,PLT:2,NA:2,K:2,CL:2,CO2:2,BUN:2,CREATININE:2,GLUCOSE:2,PT:2,INR:2,CALCIUM,2: in the last 72 hours   Discharge Medications:     Medication List     As of 03/20/2012  7:22 AM    STOP taking these medications         HYDROcodone-acetaminophen 5-325 MG per tablet   Commonly known as: NORCO/VICODIN  lidocaine 5 %   Commonly known as: LIDODERM      oxyCODONE-acetaminophen 5-325 MG per tablet   Commonly known as: PERCOCET/ROXICET      TAKE these medications         albuterol 108 (90 BASE) MCG/ACT inhaler   Commonly known as: PROVENTIL HFA;VENTOLIN HFA   Inhale 2 puffs into the lungs every 4 (four) hours as needed for wheezing.      aspirin 325 MG tablet   Take 975 mg by mouth daily as needed. As needed for pain.      cyclobenzaprine 10 MG tablet   Commonly known as: FLEXERIL   Take 1 tablet (10 mg total) by mouth 2 (two) times daily as needed for muscle spasms.      docusate sodium 100 MG capsule   Commonly known as: COLACE   Take 1 capsule (100 mg total) by mouth 2 (two) times daily.      ondansetron 4 MG tablet   Commonly  known as: ZOFRAN   Take 1 tablet (4 mg total) by mouth every 8 (eight) hours as needed for nausea.      oxyCODONE-acetaminophen 10-325 MG per tablet   Commonly known as: PERCOCET   Take 1-2 tablets by mouth every 4 (four) hours as needed for pain.        Diagnostic Studies: Dg Cervical Spine 2-3 Views  03/19/2012  *RADIOLOGY REPORT*  Clinical Data: C4-5 anterior cervical decompression  CERVICAL SPINE - 2-3 VIEW  Comparison: None.  Findings: AP and lateral cervical spine films done portable at 1100 hours demonstrates a C4-5 total disc replacement prosthesis.  The C4 and C5 components appear to be in good position.  Moderate anterior soft tissue swelling with air is an expected postoperative finding.  IMPRESSION: Satisfactory appearance status post C4-5 total disc replacement.   Original Report Authenticated By: Davonna Belling, M.D.    Dg Cervical Spine 2-3 Views  03/19/2012  *RADIOLOGY REPORT*  Clinical Data: C4-5 DISC REPLACEMENT.  DG C-ARM 61-120 MIN,CERVICAL SPINE - 2-3 VIEW  Comparison: 03/13/2012  Findings: Two intraoperative spot images demonstrate changes of disc replacement at C4-5.  Normal alignment.  No hardware complicating feature.  IMPRESSION: Intraoperative spot images as above.   Original Report Authenticated By: Charlett Nose, M.D.    X-ray Cervical Spine Ap And Lateral  03/13/2012  *RADIOLOGY REPORT*  Clinical Data: Cervical disc disease.  CERVICAL SPINE - 2-3 VIEW  Comparison: None.  Findings: There is slight reversal of the normal cervical lordosis. Osseous structures are normal.  No disc space narrowing.  No facet arthritis.  IMPRESSION: No significant abnormality.   Original Report Authenticated By: Francene Boyers, M.D.    Dg C-arm [phone] Min  03/19/2012  *RADIOLOGY REPORT*  Clinical Data: C4-5 DISC REPLACEMENT.  DG C-ARM 61-120 MIN,CERVICAL SPINE - 2-3 VIEW  Comparison: 03/13/2012  Findings: Two intraoperative spot images demonstrate changes of disc replacement at C4-5.  Normal  alignment.  No hardware complicating feature.  IMPRESSION: Intraoperative spot images as above.   Original Report Authenticated By: Charlett Nose, M.D.         Discharge Plan:  discharge to home  Disposition:  Patient doing well s/p C4/5 TDR.   F/U 2 weeks Continue soft collar as needed Instructions provided    Signed: Venita Lick D for Dr. Venita Lick [facility] [phone] 03/20/2012, 7:22 AM

## 2012-03-20 NOTE — Progress Notes (Signed)
    Subjective: Procedure(s) (LRB): ANTERIOR CERVICAL DECOMPRESSION/DISCECTOMY FUSION 1 LEVEL (Left) 1 Day Post-Op  Patient reports pain as 2 on 0-10 scale.  Reports decreased arm pain minimal incisional neck pain   Positive void Negative bowel movement Positive flatus Negative chest pain or shortness of breath  Objective: Vital signs in last 24 hours: Temp:  [97.6 F (36.4 C)-98.4 F (36.9 C)] 98.4 F (36.9 C) (12/20 1610) Pulse Rate:  [67-100] 95  (12/20 0608) Resp:  [16-26] 18  (12/20 0608) BP: (108-135)/(64-99) 131/64 mmHg (12/20 0608) SpO2:  [94 %-100 %] 98 % (12/20 9604)  Intake/Output from previous day: 12/19 0701 - 12/20 0700 In: 2080 [P.O.:240; I.V.:1600] Out: 35 [Blood:35]  Labs: No results found for this basename: WBC:2,RBC:2,HCT:2,PLT:2 in the last 72 hours No results found for this basename: NA:2,K:2,CL:2,CO2:2,BUN:2,CREATININE:2,GLUCOSE:2,CALCIUM:2 in the last 72 hours No results found for this basename: LABPT:2,INR:2 in the last 72 hours  Physical Exam: Neurologically intact ABD soft Intact pulses distally Incision: dressing C/D/I, no drainage and no SOB/wheezing No cellulitis present Compartment soft  Assessment/Plan: Patient stable  xrays satisfactory Mobilization with physical therapy Encourage incentive spirometry Continue care  Advance diet Up with therapy Discharge home with home health if needed  Venita Lick, MD [facility] [phone]

## 2012-03-20 NOTE — Progress Notes (Signed)
Pt discharged to home accompanied by family. Discharge information and rx given and explained and pt stated understanding. Pts IV was removed. Pt left unit in a stable condition via wheelchair. 

## 2012-03-20 NOTE — Evaluation (Signed)
Occupational Therapy Evaluation Patient Details Name: Alisha Espinoza MRN: [id number] DOB: December 03, 1965 Today's Date: 03/20/2012 Time: 6962-9528 OT Time Calculation (min): 11 min  OT Assessment / Plan / Recommendation Clinical Impression  46 yo female s/p ACDF that does not require acute OT. Ot to sign off    OT Assessment  Patient does not need any further OT services    Follow Up Recommendations       Barriers to Discharge      Equipment Recommendations  None recommended by OT    Recommendations for Other Services    Frequency       Precautions / Restrictions Precautions Precautions: Cervical Required Braces or Orthoses: Cervical Brace Restrictions Weight Bearing Restrictions: No   Pertinent Vitals/Pain Reports discomfort swallowing    ADL  ADL Comments: Pt dressed on arrival and RN Becca providing d/c instructions. Pt educated on soft brace v/s aspen hard cervical. pt educated on changing pad, hygiene and cleaning pads. pt educate don proper position in aspen collar and how to adjust. Pt instantly reports decreased discomfort and states "OH I COULD JUST HUG YOUR NECK I AM Wearing this one" Pt placing soft collar in bag. Pt educated on edema management and to inform MD if swallow problems persist at follow up appoint. Pt provided ice cream to help with swelling. Pt with no UE abnormal sensations or ROM deficits at this time. Pt is at adequate level for d/c    OT Diagnosis:    OT Problem List:   OT Treatment Interventions:     OT Goals    Visit Information  Last OT Received On: 03/20/12 Assistance Needed: +1    Subjective Data  Subjective: "No i think I got it" Patient Stated Goal: to go home today   Prior Functioning     Prior Function Level of Independence: Independent Communication Communication: No difficulties Dominant Hand: Right         Vision/Perception     Cognition  Overall Cognitive Status: Appears within functional limits for tasks  assessed/performed Arousal/Alertness: Awake/alert Orientation Level: Appears intact for tasks assessed Behavior During Session: [facility] for tasks performed    Extremity/Trunk Assessment Right Upper Extremity Assessment RUE ROM/Strength/Tone: Within functional levels RUE Sensation: WFL - Light Touch RUE Coordination: WFL - gross/fine motor Left Upper Extremity Assessment LUE ROM/Strength/Tone: Within functional levels LUE Sensation: WFL - Light Touch LUE Coordination: WFL - gross/fine motor     Mobility Transfers Details for Transfer Assistance: ambulating around room independent      Shoulder Instructions     Exercise     Balance     End of Session OT - End of Session Activity Tolerance: Patient tolerated treatment well Patient left: Other (comment) (Standing in room packing bag) Nurse Communication: Mobility status;Precautions  GO Functional Assessment Tool Used: clincial judgement Functional Limitation: Self care Self Care Current Status (U1324): 0 percent impaired, limited or restricted Self Care Goal Status (M0102): 0 percent impaired, limited or restricted Self Care Discharge Status (V2536): 0 percent impaired, limited or restricted   All education for cervical precautions complete and handout given. Pt and family with not further questions  Harrel Carina [facility] 03/20/2012, 11:37 AM Pager: [phone]

## 2012-03-23 ENCOUNTER — Encounter (HOSPITAL_COMMUNITY): Payer: Self-pay | Admitting: Orthopedic Surgery

## 2012-05-04 ENCOUNTER — Encounter (HOSPITAL_COMMUNITY): Payer: Self-pay | Admitting: Pharmacy Technician

## 2012-05-04 DIAGNOSIS — Z967 Presence of other bone and tendon implants: Secondary | ICD-10-CM

## 2012-05-04 NOTE — H&P (Signed)
  History of Present Illness([name] Gillian Shields; 05/01/2012 3:37 PM) The patient is a 47 year old female presenting for a post-operative visit. Patient is 6 weeks postop following their cervical total disc replacement.Overall the patient feels that they are doing fair. Pain medications include: Hydrocodone .    Subjective Transcription  She returns today six weeks out from her total disc replacement at C4-5. Unfortunately there has been anterior migration of the prosthesis. It is not completely dislodged but my fear is that if it continues it will and it can cause esophageal erosion. There is a definitely change from her immediate postop x-ray of 03/19/12.    Allergies MOBIC. 05/01/2010 Norco *ANALGESICS - OPIOID*. Hives. (Marked as Inactive)   Social History Tobacco use. Current every day smoker, Has been smoking for 15 years, Smokes < 1 pack of cigarettes per day. Alcohol use. Occasional alcohol use.   Medication History Norco (5-325MG  Tablet, 1 (one) Oral q 8 hours prn pain, Taken starting 04/03/2012) Active. Robaxin (500MG  Tablet, 1 (one) Tablet Oral three times daily, Taken starting 04/03/2012) Active. (ddb/lawl via escribe Sharl Ma) Lidoderm (5% Patch, 1 (one) Patch External apply 12hrs on 12hrs off for pain, Taken starting 12/03/2011) Active. Percocet (10-325MG  Tablet, Oral) Active. Aspirin (325MG  Tablet, 1 Oral) Active. muscle relaxer Active.  NO SOB/CP Lungs CTA Heart: RRR Abd soft/nt Neuro - intact 5/5 UE strength.  Decreased sensation to LT in C5 distribution Neg babinski/clonus/hoffman No shoulder/elbow/wrist pain with ROM Neck/sacuplar pain with motion and palpation  Xrays: anterior migration of cervical TDR when compared to post-op films No fracture  Assessment & Plan([name] Gillian Shields; 05/01/2012 4:28 PM) S/P cervical spinal fusion (V45.4) Current Plans  Plans Transcription  We are six weeks today and at this point we will have to do a  revision surgery. I am going to have her see Dr. Ezzard Standing to make sure there is no abnormality in her vocal cord. If so, I can approach this from the right side which will be less of an issue concerning scar tissue. The plan will be to remove the total disc prosthesis and then do a fusion surgery. The risks of surgery include infection, bleeding, nerve damage, death, stroke, paralysis, loss of bowel and bladder control, throat pain, swallowing difficulties, hoarseness in the voice, need for further surgery. She has demonstrated the issue with the x-rays. She is aware of the problem. I am going to put her in a stiff collar until surgery so that there is less motion that could cause further migration.  No evidence of airway compromise at present.   Will also get CT scan to confirm extent of migration.  Miscellaneous Transcription(Ysidro Ramsay Sheela Stack, MD; 05/01/2012 4:19 PM)  Alvy Beal, MD/[facility]

## 2012-05-05 ENCOUNTER — Ambulatory Visit (HOSPITAL_COMMUNITY)
Admission: RE | Admit: 2012-05-05 | Discharge: 2012-05-05 | Disposition: A | Discharge status: Home / Self Care | Payer: Self-pay | Source: Ambulatory Visit | Attending: Orthopedic Surgery | Admitting: Orthopedic Surgery

## 2012-05-05 ENCOUNTER — Encounter (HOSPITAL_COMMUNITY)
Admission: RE | Admit: 2012-05-05 | Discharge: 2012-05-05 | Disposition: A | Discharge status: Home / Self Care | Payer: Self-pay | Source: Ambulatory Visit | Attending: Orthopedic Surgery | Admitting: Orthopedic Surgery

## 2012-05-05 ENCOUNTER — Encounter (HOSPITAL_COMMUNITY)
Admission: RE | Admit: 2012-05-05 | Discharge: 2012-05-05 | Disposition: A | Discharge status: Home / Self Care | Payer: Worker's Compensation | Source: Ambulatory Visit | Attending: Orthopedic Surgery | Admitting: Orthopedic Surgery

## 2012-05-05 ENCOUNTER — Encounter (HOSPITAL_COMMUNITY): Payer: Self-pay

## 2012-05-05 DIAGNOSIS — M542 Cervicalgia: Secondary | ICD-10-CM | POA: Insufficient documentation

## 2012-05-05 DIAGNOSIS — Z01818 Encounter for other preprocedural examination: Secondary | ICD-10-CM | POA: Insufficient documentation

## 2012-05-05 LAB — CBC
HCT: 43.1 % (ref 36.0–46.0)
Hemoglobin: 14.9 g/dL (ref 12.0–15.0)
MCH: 28 pg (ref 26.0–34.0)
MCHC: 34.6 g/dL (ref 30.0–36.0)
MCV: 80.9 fL (ref 78.0–100.0)
Platelets: 220 10*3/uL (ref 150–400)
RBC: 5.33 MIL/uL — ABNORMAL HIGH (ref 3.87–5.11)
RDW: 15.1 % (ref 11.5–15.5)
WBC: 8.4 10*3/uL (ref 4.0–10.5)

## 2012-05-05 LAB — SURGICAL PCR SCREEN
MRSA, PCR: NEGATIVE
Staphylococcus aureus: NEGATIVE

## 2012-05-05 NOTE — Pre-Procedure Instructions (Signed)
Jillene Wehrenberg  05/05/2012   Your procedure is scheduled on:  Thursday May 07, 2012  Report to [facility] at 5:30 AM.  Call this number if you have problems the morning of surgery: [phone]   Remember:   Do not eat food or drink liquids after midnight.   Take these medicines the morning of surgery with A SIP OF WATER: hydrocodone, robaxin   Do not wear jewelry, make-up or nail polish.  Do not wear lotions, powders, or perfumes.   Do not shave 48 hours prior to surgery.   Do not bring valuables to the hospital.  Contacts, dentures or bridgework may not be worn into surgery.  Leave suitcase in the car. After surgery it may be brought to your room.  For patients admitted to the hospital, checkout time is 11:00 AM the day of  discharge.   Patients discharged the day of surgery will not be allowed to drive  home.  Name and phone number of your driver: family / friend  Special Instructions: Shower using CHG 2 nights before surgery and the night before surgery.  If you shower the day of surgery use CHG.  Use special wash - you have one bottle of CHG for all showers.  You should use approximately 1/3 of the bottle for each shower.   Please read over the following fact sheets that you were given: Pain Booklet, Coughing and Deep Breathing, MRSA Information and Surgical Site Infection Prevention

## 2012-05-06 MED ORDER — DEXAMETHASONE SODIUM PHOSPHATE 4 MG/ML IJ SOLN
4.0000 mg | Freq: Once | INTRAMUSCULAR | Status: DC
  Filled 2012-05-06: qty 1

## 2012-05-06 MED ORDER — CEFAZOLIN SODIUM-DEXTROSE 2-3 GM-% IV SOLR
2.0000 g | INTRAVENOUS | Status: AC
  Administered 2012-05-07: 2 g via INTRAVENOUS
  Filled 2012-05-06: qty 50

## 2012-05-07 ENCOUNTER — Encounter (HOSPITAL_COMMUNITY): Payer: Self-pay | Admitting: Anesthesiology

## 2012-05-07 ENCOUNTER — Encounter (HOSPITAL_COMMUNITY)
Admission: RE | Disposition: A | Discharge status: Home / Self Care | Payer: Self-pay | Source: Ambulatory Visit | Attending: Orthopedic Surgery

## 2012-05-07 ENCOUNTER — Encounter (HOSPITAL_COMMUNITY): Payer: Self-pay | Admitting: *Deleted

## 2012-05-07 ENCOUNTER — Ambulatory Visit (HOSPITAL_COMMUNITY): Payer: Worker's Compensation | Admitting: Anesthesiology

## 2012-05-07 ENCOUNTER — Observation Stay (HOSPITAL_COMMUNITY): Payer: Worker's Compensation

## 2012-05-07 ENCOUNTER — Ambulatory Visit (HOSPITAL_COMMUNITY): Payer: Worker's Compensation

## 2012-05-07 ENCOUNTER — Observation Stay (HOSPITAL_COMMUNITY)
Admission: RE | Admit: 2012-05-07 | Discharge: 2012-05-08 | Disposition: A | Discharge status: Home / Self Care | Payer: Worker's Compensation | Source: Ambulatory Visit | Attending: Orthopedic Surgery | Admitting: Orthopedic Surgery

## 2012-05-07 DIAGNOSIS — Z01812 Encounter for preprocedural laboratory examination: Secondary | ICD-10-CM | POA: Insufficient documentation

## 2012-05-07 DIAGNOSIS — Z967 Presence of other bone and tendon implants: Secondary | ICD-10-CM

## 2012-05-07 DIAGNOSIS — Y838 Other surgical procedures as the cause of abnormal reaction of the patient, or of later complication, without mention of misadventure at the time of the procedure: Secondary | ICD-10-CM | POA: Insufficient documentation

## 2012-05-07 DIAGNOSIS — T84498A Other mechanical complication of other internal orthopedic devices, implants and grafts, initial encounter: Principal | ICD-10-CM | POA: Insufficient documentation

## 2012-05-07 DIAGNOSIS — Z23 Encounter for immunization: Secondary | ICD-10-CM | POA: Insufficient documentation

## 2012-05-07 HISTORY — PX: ANTERIOR CERVICAL DECOMP/DISCECTOMY FUSION: SHX1161

## 2012-05-07 HISTORY — PX: CERVICAL FUSION: SHX112

## 2012-05-07 SURGERY — ANTERIOR CERVICAL DECOMPRESSION/DISCECTOMY FUSION 1 LEVEL/HARDWARE REMOVAL
Anesthesia: General | Site: Neck | Wound class: Clean

## 2012-05-07 MED ORDER — FENTANYL CITRATE 0.05 MG/ML IJ SOLN
INTRAMUSCULAR | Status: DC | PRN
  Administered 2012-05-07: 50 ug via INTRAVENOUS
  Administered 2012-05-07 (×3): 100 ug via INTRAVENOUS
  Administered 2012-05-07 (×3): 50 ug via INTRAVENOUS

## 2012-05-07 MED ORDER — PROPOFOL 10 MG/ML IV BOLUS
INTRAVENOUS | Status: DC | PRN
  Administered 2012-05-07: 200 mg via INTRAVENOUS
  Administered 2012-05-07: 50 mg via INTRAVENOUS

## 2012-05-07 MED ORDER — LIDOCAINE HCL (CARDIAC) 20 MG/ML IV SOLN
INTRAVENOUS | Status: DC | PRN
  Administered 2012-05-07: 100 mg via INTRAVENOUS

## 2012-05-07 MED ORDER — NEOSTIGMINE METHYLSULFATE 1 MG/ML IJ SOLN
INTRAMUSCULAR | Status: DC | PRN
  Administered 2012-05-07: 5 mg via INTRAVENOUS

## 2012-05-07 MED ORDER — THROMBIN 20000 UNITS EX SOLR
CUTANEOUS | Status: AC
  Filled 2012-05-07: qty 20000

## 2012-05-07 MED ORDER — ARTIFICIAL TEARS OP OINT
TOPICAL_OINTMENT | OPHTHALMIC | Status: DC | PRN
  Administered 2012-05-07: 1 via OPHTHALMIC

## 2012-05-07 MED ORDER — HEMOSTATIC AGENTS (NO CHARGE) OPTIME
TOPICAL | Status: DC | PRN
  Administered 2012-05-07: 1 via TOPICAL

## 2012-05-07 MED ORDER — WHITE PETROLATUM GEL
Status: AC
  Filled 2012-05-07: qty 5

## 2012-05-07 MED ORDER — OXYCODONE HCL 5 MG PO TABS: 5.0000 mg | ORAL_TABLET | Freq: Once | ORAL | Status: DC | PRN

## 2012-05-07 MED ORDER — OXYCODONE HCL 5 MG/5ML PO SOLN: 5.0000 mg | Freq: Once | ORAL | Status: DC | PRN

## 2012-05-07 MED ORDER — CEFAZOLIN SODIUM 1-5 GM-% IV SOLN
1.0000 g | Freq: Three times a day (TID) | INTRAVENOUS | Status: AC
  Administered 2012-05-07 (×2): 1 g via INTRAVENOUS
  Filled 2012-05-07 (×2): qty 50

## 2012-05-07 MED ORDER — MORPHINE SULFATE 2 MG/ML IJ SOLN
1.0000 mg | INTRAMUSCULAR | Status: DC | PRN
  Administered 2012-05-07: 2 mg via INTRAVENOUS
  Administered 2012-05-08 (×2): 4 mg via INTRAVENOUS
  Filled 2012-05-07 (×2): qty 2
  Filled 2012-05-07: qty 1

## 2012-05-07 MED ORDER — ONDANSETRON HCL 4 MG/2ML IJ SOLN: 4.0000 mg | INTRAMUSCULAR | Status: DC | PRN

## 2012-05-07 MED ORDER — MEPERIDINE HCL 25 MG/ML IJ SOLN: 6.2500 mg | INTRAMUSCULAR | Status: DC | PRN

## 2012-05-07 MED ORDER — ACETAMINOPHEN 10 MG/ML IV SOLN
1000.0000 mg | Freq: Four times a day (QID) | INTRAVENOUS | Status: AC
  Administered 2012-05-07 – 2012-05-08 (×4): 1000 mg via INTRAVENOUS
  Filled 2012-05-07 (×4): qty 100

## 2012-05-07 MED ORDER — PHENYLEPHRINE HCL 10 MG/ML IJ SOLN
INTRAMUSCULAR | Status: DC | PRN
  Administered 2012-05-07 (×2): 40 ug via INTRAVENOUS
  Administered 2012-05-07: 80 ug via INTRAVENOUS
  Administered 2012-05-07 (×2): 40 ug via INTRAVENOUS
  Administered 2012-05-07: 80 ug via INTRAVENOUS

## 2012-05-07 MED ORDER — MIDAZOLAM HCL 5 MG/5ML IJ SOLN
INTRAMUSCULAR | Status: DC | PRN
  Administered 2012-05-07: 2 mg via INTRAVENOUS

## 2012-05-07 MED ORDER — ACETAMINOPHEN 10 MG/ML IV SOLN
1000.0000 mg | Freq: Once | INTRAVENOUS | Status: AC
  Administered 2012-05-07: 1000 mg via INTRAVENOUS

## 2012-05-07 MED ORDER — ZOLPIDEM TARTRATE 5 MG PO TABS: 5.0000 mg | ORAL_TABLET | Freq: Every evening | ORAL | Status: DC | PRN

## 2012-05-07 MED ORDER — METHOCARBAMOL 500 MG PO TABS: 500.0000 mg | ORAL_TABLET | Freq: Four times a day (QID) | ORAL | Status: DC | PRN

## 2012-05-07 MED ORDER — HYDROMORPHONE HCL PF 1 MG/ML IJ SOLN
INTRAMUSCULAR | Status: AC
  Filled 2012-05-07: qty 1

## 2012-05-07 MED ORDER — METHOCARBAMOL 100 MG/ML IJ SOLN: 500.0000 mg | Freq: Four times a day (QID) | INTRAVENOUS | Status: DC | PRN

## 2012-05-07 MED ORDER — THROMBIN 20000 UNITS EX SOLR
OROMUCOSAL | Status: DC | PRN
  Administered 2012-05-07: 08:00:00 via TOPICAL

## 2012-05-07 MED ORDER — MENTHOL 3 MG MT LOZG
1.0000 | LOZENGE | OROMUCOSAL | Status: DC | PRN
  Filled 2012-05-07: qty 9

## 2012-05-07 MED ORDER — SODIUM CHLORIDE 0.9 % IJ SOLN: 3.0000 mL | INTRAMUSCULAR | Status: DC | PRN

## 2012-05-07 MED ORDER — ACETAMINOPHEN 10 MG/ML IV SOLN
INTRAVENOUS | Status: AC
  Filled 2012-05-07: qty 100

## 2012-05-07 MED ORDER — SODIUM CHLORIDE 0.9 % IV SOLN: 250.0000 mL | INTRAVENOUS | Status: DC

## 2012-05-07 MED ORDER — PHENOL 1.4 % MT LIQD: 1.0000 | OROMUCOSAL | Status: DC | PRN

## 2012-05-07 MED ORDER — BUPIVACAINE-EPINEPHRINE 0.25% -1:200000 IJ SOLN
INTRAMUSCULAR | Status: DC | PRN
  Administered 2012-05-07: 4 mL

## 2012-05-07 MED ORDER — ROCURONIUM BROMIDE 100 MG/10ML IV SOLN
INTRAVENOUS | Status: DC | PRN
  Administered 2012-05-07 (×2): 10 mg via INTRAVENOUS
  Administered 2012-05-07: 50 mg via INTRAVENOUS

## 2012-05-07 MED ORDER — LACTATED RINGERS IV SOLN
INTRAVENOUS | Status: DC
  Administered 2012-05-07 (×2): via INTRAVENOUS

## 2012-05-07 MED ORDER — 0.9 % SODIUM CHLORIDE (POUR BTL) OPTIME
TOPICAL | Status: DC | PRN
  Administered 2012-05-07: 1000 mL

## 2012-05-07 MED ORDER — ONDANSETRON HCL 4 MG/2ML IJ SOLN
INTRAMUSCULAR | Status: DC | PRN
  Administered 2012-05-07: 4 mg via INTRAVENOUS

## 2012-05-07 MED ORDER — SODIUM CHLORIDE 0.9 % IJ SOLN
3.0000 mL | Freq: Two times a day (BID) | INTRAMUSCULAR | Status: DC
  Administered 2012-05-07: 3 mL via INTRAVENOUS

## 2012-05-07 MED ORDER — HYDROMORPHONE HCL PF 1 MG/ML IJ SOLN
0.2500 mg | INTRAMUSCULAR | Status: DC | PRN
  Administered 2012-05-07 (×2): 0.5 mg via INTRAVENOUS

## 2012-05-07 MED ORDER — GLYCOPYRROLATE 0.2 MG/ML IJ SOLN
INTRAMUSCULAR | Status: DC | PRN
  Administered 2012-05-07: .8 mg via INTRAVENOUS

## 2012-05-07 MED ORDER — LACTATED RINGERS IV SOLN
INTRAVENOUS | Status: DC | PRN
  Administered 2012-05-07 (×2): via INTRAVENOUS

## 2012-05-07 MED ORDER — OXYCODONE HCL 5 MG PO TABS
10.0000 mg | ORAL_TABLET | ORAL | Status: DC | PRN
  Administered 2012-05-07 – 2012-05-08 (×2): 10 mg via ORAL
  Filled 2012-05-07: qty 1
  Filled 2012-05-07: qty 2

## 2012-05-07 MED ORDER — DEXAMETHASONE SODIUM PHOSPHATE 10 MG/ML IJ SOLN
INTRAMUSCULAR | Status: AC
  Administered 2012-05-07: 10 mg via INTRAVENOUS
  Filled 2012-05-07: qty 1

## 2012-05-07 MED ORDER — PROMETHAZINE HCL 25 MG/ML IJ SOLN: 6.2500 mg | INTRAMUSCULAR | Status: DC | PRN

## 2012-05-07 SURGICAL SUPPLY — 73 items
BENZOIN TINCTURE PRP APPL 2/3 (GAUZE/BANDAGES/DRESSINGS) IMPLANT
BIT DRILL SKYLINE 12MM (BIT) ×1 IMPLANT
BIT DRILL SRG 14X2.2XFLT CHK (BIT) ×1 IMPLANT
BIT DRL SRG 14X2.2XFLT CHK (BIT) ×1
BLADE SURG 15 STRL LF DISP TIS (BLADE) IMPLANT
BLADE SURG 15 STRL SS (BLADE)
BLADE SURG ROTATE 9660 (MISCELLANEOUS) IMPLANT
BUR EGG ELITE 4.0 (BURR) IMPLANT
BUR MATCHSTICK NEURO 3.0 LAGG (BURR) IMPLANT
CANISTER SUCTION 2500CC (MISCELLANEOUS) ×2 IMPLANT
CLOTH BEACON ORANGE TIMEOUT ST (SAFETY) ×2 IMPLANT
CLSR STERI-STRIP ANTIMIC 1/2X4 (GAUZE/BANDAGES/DRESSINGS) ×2 IMPLANT
COLLAR CERV LO CONTOUR FIRM DE (SOFTGOODS) IMPLANT
CORDS BIPOLAR (ELECTRODE) ×2 IMPLANT
COVER MAYO STAND STRL (DRAPES) ×2 IMPLANT
COVER SURGICAL LIGHT HANDLE (MISCELLANEOUS) ×4 IMPLANT
CRADLE DONUT ADULT HEAD (MISCELLANEOUS) ×2 IMPLANT
DERMABOND ADVANCED (GAUZE/BANDAGES/DRESSINGS)
DERMABOND ADVANCED .7 DNX12 (GAUZE/BANDAGES/DRESSINGS) IMPLANT
DRAPE C-ARM 42X72 X-RAY (DRAPES) ×2 IMPLANT
DRAPE INCISE IOBAN 66X45 STRL (DRAPES) IMPLANT
DRAPE LAPAROTOMY T 102X78X121 (DRAPES) IMPLANT
DRAPE POUCH INSTRU U-SHP 10X18 (DRAPES) ×2 IMPLANT
DRAPE PROXIMA HALF (DRAPES) ×2 IMPLANT
DRAPE SURG 17X23 STRL (DRAPES) ×2 IMPLANT
DRAPE U-SHAPE 47X51 STRL (DRAPES) ×4 IMPLANT
DRILL BIT SKYLINE 12MM (BIT) ×1
DRILL BIT SKYLINE 14MM (BIT) ×1
DRSG MEPILEX BORDER 4X8 (GAUZE/BANDAGES/DRESSINGS) IMPLANT
ELECT CAUTERY BLADE 6.4 (BLADE) IMPLANT
ELECT COATED BLADE 2.86 ST (ELECTRODE) ×2 IMPLANT
ELECT REM PT RETURN 9FT ADLT (ELECTROSURGICAL) ×2
ELECTRODE REM PT RTRN 9FT ADLT (ELECTROSURGICAL) ×1 IMPLANT
GLOVE BIOGEL PI IND STRL 6.5 (GLOVE) IMPLANT
GLOVE BIOGEL PI IND STRL 8.5 (GLOVE) ×1 IMPLANT
GLOVE BIOGEL PI INDICATOR 6.5 (GLOVE)
GLOVE BIOGEL PI INDICATOR 8.5 (GLOVE) ×1
GLOVE ECLIPSE 6.0 STRL STRAW (GLOVE) ×2 IMPLANT
GLOVE ECLIPSE 8.5 STRL (GLOVE) ×2 IMPLANT
GOWN PREVENTION PLUS XXLARGE (GOWN DISPOSABLE) ×2 IMPLANT
GOWN STRL NON-REIN LRG LVL3 (GOWN DISPOSABLE) ×4 IMPLANT
HEMOSTAT SURGICEL 2X14 (HEMOSTASIS) IMPLANT
INTERLOCK LRDTC CRVCL VBR 7MM (Bone Implant) ×1 IMPLANT
KIT BASIN OR (CUSTOM PROCEDURE TRAY) ×2 IMPLANT
KIT ROOM TURNOVER OR (KITS) ×2 IMPLANT
LORDOTIC CERVICAL VBR 7MM SM (Bone Implant) ×2 IMPLANT
NEEDLE SPNL 18GX3.5 QUINCKE PK (NEEDLE) ×2 IMPLANT
NEEDLE SPNL 20GX3.5 QUINCKE YW (NEEDLE) ×2 IMPLANT
NS IRRIG 1000ML POUR BTL (IV SOLUTION) ×2 IMPLANT
PACK ORTHO CERVICAL (CUSTOM PROCEDURE TRAY) ×2 IMPLANT
PACK UNIVERSAL I (CUSTOM PROCEDURE TRAY) ×2 IMPLANT
PAD ARMBOARD 7.5X6 YLW CONV (MISCELLANEOUS) ×4 IMPLANT
PATTIES SURGICAL .25X.25 (GAUZE/BANDAGES/DRESSINGS) IMPLANT
PATTIES SURGICAL .5 X.5 (GAUZE/BANDAGES/DRESSINGS) IMPLANT
PATTIES SURGICAL .5 X1 (DISPOSABLE) IMPLANT
PIN DISTRACTION 16MM (PIN) IMPLANT
PLATE ONE LEVEL SKYLINE 14MM (Plate) ×2 IMPLANT
PUTTY BONE DBX 2.5 MIS (Bone Implant) ×2 IMPLANT
SCREW SKYLINE 14MM SD-VA (Screw) ×8 IMPLANT
SPONGE LAP 4X18 X RAY DECT (DISPOSABLE) ×2 IMPLANT
SPONGE SURGIFOAM ABS GEL 100 (HEMOSTASIS) ×2 IMPLANT
SURGIFLO TRUKIT (HEMOSTASIS) ×2 IMPLANT
SUT MNCRL AB 3-0 PS2 18 (SUTURE) ×2 IMPLANT
SUT VIC AB 2-0 CT1 36 (SUTURE) ×2 IMPLANT
SUT VIC AB 3-0 X1 27 (SUTURE) IMPLANT
SYR 30ML SLIP (SYRINGE) ×2 IMPLANT
SYR CONTROL 10ML LL (SYRINGE) ×2 IMPLANT
TAPE CLOTH 4X10 WHT NS (GAUZE/BANDAGES/DRESSINGS) ×2 IMPLANT
TAPE UMBILICAL COTTON 1/8X30 (MISCELLANEOUS) ×4 IMPLANT
TOWEL OR 17X24 6PK STRL BLUE (TOWEL DISPOSABLE) ×2 IMPLANT
TOWEL OR 17X26 10 PK STRL BLUE (TOWEL DISPOSABLE) ×2 IMPLANT
TRAY FOLEY CATH 14FR (SET/KITS/TRAYS/PACK) IMPLANT
WATER STERILE IRR 1000ML POUR (IV SOLUTION) ×2 IMPLANT

## 2012-05-07 NOTE — Brief Op Note (Signed)
05/07/2012  9:54 AM  PATIENT:  Alisha Espinoza  47 y.o. 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  POST-OPERATIVE DIAGNOSIS:  hardware fairlure and migration of cervical arthroplasty  PROCEDURE:  Procedure(s) (LRB) with comments: ANTERIOR CERVICAL DECOMPRESSION/DISCECTOMY FUSION 1 LEVEL/HARDWARE REMOVAL (N/A) - REMOVAL OF CERVICAL DISC REPLACEMENT AND ACDF C4-5  SURGEON:  Surgeon(s) and Role:    * Venita Lick, MD - Primary  PHYSICIAN ASSISTANT:   ASSISTANTS: none   ANESTHESIA:   general  EBL:  Total I/O In: 1600 [I.V.:1600] Out: 50 [Urine:50]  BLOOD ADMINISTERED:none  DRAINS: none   LOCAL MEDICATIONS USED:  MARCAINE     SPECIMEN:  No Specimen  DISPOSITION OF SPECIMEN:  N/A  COUNTS:  YES  TOURNIQUET:  * No tourniquets in log *  DICTATION: .Other Dictation: Dictation Number [id number]  PLAN OF CARE: Admit to inpatient   PATIENT DISPOSITION:  PACU - hemodynamically stable.

## 2012-05-07 NOTE — Anesthesia Preprocedure Evaluation (Signed)
Anesthesia Evaluation  Patient identified by MRN, date of birth, ID band Patient awake    Reviewed: Allergy & Precautions, H&P , NPO status , Patient's Chart, lab work & pertinent test results  History of Anesthesia Complications Negative for: history of anesthetic complications  Airway Mallampati: I      Dental No notable dental hx. (+) Teeth Intact   Pulmonary neg pulmonary ROS, resolved,  breath sounds clear to auscultation  Pulmonary exam normal       Cardiovascular negative cardio ROS  I+ Valvular Problems/Murmurs Rhythm:regular Rate:Normal     Neuro/Psych negative neurological ROS  negative psych ROS   GI/Hepatic negative GI ROS, Neg liver ROS,   Endo/Other  negative endocrine ROS  Renal/GU negative Renal ROS  negative genitourinary   Musculoskeletal   Abdominal   Peds  Hematology negative hematology ROS (+)   Anesthesia Other Findings   Reproductive/Obstetrics negative OB ROS                           Anesthesia Physical Anesthesia Plan  ASA: II  Anesthesia Plan: General and General ETT   Post-op Pain Management:    Induction:   Airway Management Planned:   Additional Equipment:   Intra-op Plan:   Post-operative Plan:   Informed Consent: I have reviewed the patients History and Physical, chart, labs and discussed the procedure including the risks, benefits and alternatives for the proposed anesthesia with the patient or authorized representative who has indicated his/her understanding and acceptance.   Dental Advisory Given  Plan Discussed with: CRNA and Surgeon  Anesthesia Plan Comments:         Anesthesia Quick Evaluation

## 2012-05-07 NOTE — Anesthesia Procedure Notes (Signed)
Procedure Name: Intubation Date/Time: 05/07/2012 7:54 AM Performed by: Sherie Don Pre-anesthesia Checklist: Patient identified, Emergency Drugs available, Suction available, Patient being monitored and Timeout performed Patient Re-evaluated:Patient Re-evaluated prior to inductionOxygen Delivery Method: Circle system utilized Preoxygenation: Pre-oxygenation with 100% oxygen Intubation Type: IV induction Ventilation: Mask ventilation without difficulty and Oral airway inserted - appropriate to patient size Laryngoscope Size: Mac and 3 Grade View: Grade II Tube type: Oral Tube size: 7.0 mm Number of attempts: 1 Airway Equipment and Method: Stylet Placement Confirmation: ETT inserted through vocal cords under direct vision,  positive ETCO2 and breath sounds checked- equal and bilateral Secured at: 22 cm Tube secured with: Tape Dental Injury: Teeth and Oropharynx as per pre-operative assessment

## 2012-05-07 NOTE — Preoperative (Signed)
Beta Blockers   Reason not to administer Beta Blockers:Not Applicable 

## 2012-05-07 NOTE — H&P (Signed)
Patient evaluated by Dr Ezzard Standing - no abnormality with vocal cord mobility.  Cleared for surgery from right side No change in clinical exam CT scan reviewed - anterior migration of disc replacement Plan on removal and conversion to fusion H+P reviewed

## 2012-05-07 NOTE — Op Note (Signed)
NAMEKENNEDY, Alisha Espinoza               ACCOUNT NO.:  [id number]  MEDICAL RECORD NO.:  [id number]  LOCATION:  [facility]                         FACILITY:  [facility]  PHYSICIAN:  Alvy Beal, MD    DATE OF BIRTH:  November 15, 1965  DATE OF PROCEDURE:  05/07/2012 DATE OF DISCHARGE:                              OPERATIVE REPORT   PREOPERATIVE DIAGNOSIS:  Failure cervical fixation.  POSTOPERATIVE DIAGNOSIS:  Failure cervical fixation.  PROCEDURES: 1. Removal of anterior cervical ProDisc-C at C4-5. 2. Anterior cervical diskectomy and fusion, C4-5, utilizing the size 7     small lordotic Titan titanium cage, packed with DBX mix along with     the DePuy Skyline 14-mm length plate with 14-NW locking screws.  COMPLICATIONS:  None.  CONDITION:  Stable.  Final x-rays satisfactory.  HISTORY:  This is a very pleasant woman who underwent a cervical total disk replacement, approximately 6-1/2 weeks ago.  The patient presented at her 6-week followup in my office and had x-rays done last week. Those x-rays demonstrate anterior migration of the disk prosthesis. Because of the failure of fixation, I recommended a revision surgery.  I discussed all appropriate risks, benefits, and alternatives. Preoperative ENT evaluation was done, confirming that right-sided approach would be viable since there was no abnormal vocal cord function.  OPERATIVE NOTE:  The patient was brought to the operating room, placed supine on the operating table.  After successful induction of general anesthesia and endotracheal intubation, TEDs, SCDs, and a Foley were inserted, the anterior cervical spine was prepped and draped in a standard fashion.  Appropriate time-out was taken and confirming patient, procedure, and all other pertinent important data.  Once this was done, a longitudinal incision was made along the medial border of the sternocleidomastoid on the right side.  Sharp dissection was carried out down to and through the  platysma.  I sharply dissected in the deep cervical fascia along the medial border of the sternocleidomastoid.  I identified the omohyoid muscle and continued dissecting down.  I was able to palpate and visualize the carotid sheath and protected with a finger.  I then gently mobilized the esophagus off to the left-hand side and protected it with an appendiceal retractor.  I then, using Kittner dissectors, mobilized the remaining of the prevertebral fascia and scar tissue that had formed to expose the C4-5 disk space.  Once I confirmed I was at the appropriate level with direct visualization of the disk itself, I began using a fine curettes and pituitary rongeurs to remove the scar tissue overlying the disk.  The disk itself was frankly loose and it was migrating anteriorly.  Once I had it completely exposed, I connected the inserting device to it and gently slowly by rocking it in a craniocaudal fashion, removed the disk in block.  At this point, I then confirmed that all the hardware was removed with a lateral x-ray.  I then began dissecting posteriorly with a micro curette to remove the scar tissue.  Under live fluoro, I then took a distracting device and distracted and relaxed under live fluoro to confirm that I had parallel distraction.  Once I had confirmed this, I then again removed  any other fragments of material that were in the lateral recess that was still present.  I then used the sharp curette to pop into the subchondral plate, so I had bleeding.  Once I had bleeding subchondral bone, I rasped again to remove any other fibrinous material. I then trialed sequentially and elected to use the 7 small Titan titanium spacer.  This provided an excellent fit to maintain the posterior and maintain the distraction posteriorly.  I had an excellent fit with this.  I then took a size 14 anterior cervical plate and made sure that I was at the screws, were lateral to the initial skin cut  for the disk prosthesis.  I then used 14-mm locking screws, so that again adequate depth.  I made sure that I did not penetrate the posterior vertebral wall.  All 4 screws had excellent purchase.  I then locked down the screws using the CAM locking mechanism for the specifications of the manufacturer.  I then irrigated copiously with normal saline.  I then made sure I had hemostasis using bipolar electrocautery and Floseal.  I then returned the trachea and esophagus back to the midline position.  Final x-rays demonstrated satisfactory position of the plate screws and hardware.  I then closed the platysma with interrupted 2-0 Vicryl sutures and 3-0 Monocryl for the skin.  Steri-Strips and dry dressing and Aspen collar were applied.  The patient was extubated, transferred to the PACU without incident.  At the end of the case, all needle and sponge counts were correct.  There was no adverse intraoperative events.     Alvy Beal, MD     DDB/MEDQ  D:  05/07/2012  T:  05/07/2012  Job:  [id number]

## 2012-05-07 NOTE — Transfer of Care (Signed)
Immediate Anesthesia Transfer of Care Note  Patient: Alisha Espinoza  Procedure(s) Performed: Procedure(s) (LRB) with comments: ANTERIOR CERVICAL DECOMPRESSION/DISCECTOMY FUSION 1 LEVEL/HARDWARE REMOVAL (N/A) - REMOVAL OF CERVICAL DISC REPLACEMENT AND ACDF C4-5  Patient Location: PACU  Anesthesia Type:General  Level of Consciousness: awake and patient cooperative  Airway & Oxygen Therapy: Patient Spontanous Breathing and Patient connected to nasal cannula oxygen  Post-op Assessment: Report given to PACU RN, Post -op Vital signs reviewed and stable and Patient moving all extremities X 4  Post vital signs: Reviewed and stable  Complications: No apparent anesthesia complications

## 2012-05-07 NOTE — Anesthesia Postprocedure Evaluation (Signed)
  Anesthesia Post-op Note  Patient: Alisha Espinoza  Procedure(s) Performed: Procedure(s) (LRB) with comments: ANTERIOR CERVICAL DECOMPRESSION/DISCECTOMY FUSION 1 LEVEL/HARDWARE REMOVAL (N/A) - REMOVAL OF CERVICAL DISC REPLACEMENT AND ACDF C4-5  Patient Location: PACU  Anesthesia Type:General  Level of Consciousness: awake  Airway and Oxygen Therapy: Patient Spontanous Breathing  Post-op Pain: moderate  Post-op Assessment: Post-op Vital signs reviewed  Post-op Vital Signs: stable  Complications: No apparent anesthesia complications

## 2012-05-08 ENCOUNTER — Encounter (HOSPITAL_COMMUNITY): Payer: Self-pay | Admitting: General Practice

## 2012-05-08 MED ORDER — POLYETHYLENE GLYCOL 3350 17 GM/SCOOP PO POWD: 17.0000 g | Freq: Every day | ORAL | Status: DC

## 2012-05-08 MED ORDER — METHOCARBAMOL 500 MG PO TABS: 500.0000 mg | ORAL_TABLET | Freq: Three times a day (TID) | ORAL | Status: DC | PRN

## 2012-05-08 MED ORDER — HYDROCODONE-ACETAMINOPHEN 10-325 MG PO TABS: 1.0000 | ORAL_TABLET | Freq: Four times a day (QID) | ORAL | Status: DC | PRN

## 2012-05-08 MED ORDER — PNEUMOCOCCAL VAC POLYVALENT 25 MCG/0.5ML IJ INJ
0.5000 mL | INJECTION | INTRAMUSCULAR | Status: AC
  Administered 2012-05-08: 0.5 mL via INTRAMUSCULAR
  Filled 2012-05-08: qty 0.5

## 2012-05-08 NOTE — Progress Notes (Signed)
    Subjective: Procedure(s) (LRB): ANTERIOR CERVICAL DECOMPRESSION/DISCECTOMY FUSION 1 LEVEL/HARDWARE REMOVAL (N/A) 1 Day Post-Op  Patient reports pain as 2 on 0-10 scale.  Reports decreased arm pain denies incisional neck pain   Positive void Negative bowel movement Positive flatus Negative chest pain or shortness of breath  Objective: Vital signs in last 24 hours: Temp:  [97.4 F (36.3 C)-98.5 F (36.9 C)] 98.5 F (36.9 C) (02/07 0600) Pulse Rate:  [69-89] 87  (02/07 0600) Resp:  [8-23] 18  (02/07 0600) BP: (110-146)/(81-97) 116/86 mmHg (02/07 0600) SpO2:  [95 %-100 %] 98 % (02/07 0600)  Intake/Output from previous day: 02/06 0701 - 02/07 0700 In: 2600 [I.V.:2600] Out: 50 [Urine:50]  Labs:  [facility] 05/05/12 1554  WBC 8.4  RBC 5.33*  HCT 43.1  PLT 220   No results found for this basename: NA:2,K:2,CL:2,CO2:2,BUN:2,CREATININE:2,GLUCOSE:2,CALCIUM:2 in the last 72 hours No results found for this basename: LABPT:2,INR:2 in the last 72 hours  Physical Exam: Neurologically intact ABD soft Neurovascular intact Intact pulses distally Incision: dressing C/D/I and no drainage No cellulitis present Compartment soft  Assessment/Plan: Patient stable  xrays satisfactory Mobilization with physical therapy Encourage incentive spirometry Continue care  Advance diet Up with therapy D/C IV fluids D/c to home this AM  Venita Lick, MD [facility] [phone]

## 2012-05-08 NOTE — Clinical Social Work Note (Addendum)
CSW received consult for SNF. Per PT notes, patient will go home with no PT. CSW signing off, no other psychosocial concerns identified. Please re-consult as needed.  Lia Foyer, LCSWA [facility] Clinical Social Worker Contact #: [phone]

## 2012-05-08 NOTE — Progress Notes (Signed)
Utilization review completed. Starling Christofferson, RN, BSN. 

## 2012-05-08 NOTE — Evaluation (Signed)
Occupational Therapy Evaluation Patient Details Name: Alisha Espinoza MRN: [id number] DOB: 06-28-1965 Today's Date: 05/08/2012 Time: 8295-6213 OT Time Calculation (min): 11 min  OT Assessment / Plan / Recommendation Clinical Impression  Pt s/p ACDF.  Pt is at independent level with ADLs and functional mobility and has no further acute OT needs.  Pt reports she is to d/c home today.  Signing off.    OT Assessment  Patient does not need any further OT services    Follow Up Recommendations  No OT follow up    Barriers to Discharge      Equipment Recommendations  None recommended by OT    Recommendations for Other Services    Frequency       Precautions / Restrictions Precautions Precautions: Cervical Required Braces or Orthoses: Cervical Brace Cervical Brace: Hard collar;Applied in supine position Restrictions Weight Bearing Restrictions: No   Pertinent Vitals/Pain See vitals    ADL  Eating/Feeding: Performed;Independent Where Assessed - Eating/Feeding: Chair Grooming: Performed;Brushing hair;Independent Where Assessed - Grooming: Unsupported standing Toilet Transfer: Simulated;Independent Toilet Transfer Method: Sit to Barista: Comfort height toilet Equipment Used:  (cervical collar) Transfers/Ambulation Related to ADLs: independent ADL Comments: Pt independently ambulating around room on OT arrival.  Able to verbalize cervical precautions.  Discussed home safety with pt and she reports that she has brought ADL/IADL items down to counter top height.  Able to open spoon from packaging and ice cream container without difficulty.      OT Diagnosis:    OT Problem List:   OT Treatment Interventions:     OT Goals    Visit Information  Last OT Received On: 05/08/12    Subjective Data      Prior Functioning     Home Living Lives With: Alone Available Help at Discharge: Family Type of Home: Apartment Home Layout: Multi-level Alternate Level  Stairs-Number of Steps: 12 Alternate Level Stairs-Rails: Can reach both Bathroom Shower/Tub: Engineer, manufacturing systems: Standard Home Adaptive Equipment: Reacher Prior Function Level of Independence: Independent Able to Take Stairs?: Yes Driving: Yes Vocation: Full time employment Communication Communication: No difficulties         Vision/Perception     Copywriter, advertising Overall Cognitive Status: Appears within functional limits for tasks assessed/performed Arousal/Alertness: Awake/alert Orientation Level: Appears intact for tasks assessed Behavior During Session: [facility] for tasks performed    Extremity/Trunk Assessment Right Upper Extremity Assessment RUE ROM/Strength/Tone: WFL for tasks assessed RUE Sensation: WFL - Light Touch;WFL - Proprioception RUE Coordination: WFL - gross/fine motor Left Upper Extremity Assessment LUE ROM/Strength/Tone: WFL for tasks assessed LUE Sensation: WFL - Light Touch;WFL - Proprioception LUE Coordination: WFL - gross/fine motor Right Lower Extremity Assessment RLE ROM/Strength/Tone: Within functional levels Left Lower Extremity Assessment LLE ROM/Strength/Tone: Within functional levels Trunk Assessment Trunk Assessment: Normal     Mobility Bed Mobility Bed Mobility: Not assessed Right Sidelying to Sit: 7: Independent Transfers Transfers: Stand to Sit;Sit to Stand Sit to Stand: 7: Independent;From bed Stand to Sit: 7: Independent;To bed     Exercise     Balance Balance Balance Assessed: Yes Static Standing Balance Static Standing - Balance Support: No upper extremity supported Static Standing - Level of Assistance: 7: Independent Dynamic Standing Balance Dynamic Standing - Balance Support: No upper extremity supported;During functional activity Dynamic Standing - Level of Assistance: 7: Independent   End of Session OT - End of Session Equipment Utilized During Treatment: Cervical collar Activity Tolerance: Patient  tolerated treatment well Patient left:  (  ambulating in room)  GO Functional Assessment Tool Used: clinical judgement Functional Limitation: Self care Self Care Current Status (Y7829): 0 percent impaired, limited or restricted Self Care Goal Status (F6213): 0 percent impaired, limited or restricted Self Care Discharge Status [phone]): 0 percent impaired, limited or restricted  05/08/2012 Cipriano Mile OTR/L Pager [phone] Office [phone]  Cipriano Mile 05/08/2012, 10:47 AM

## 2012-05-08 NOTE — Evaluation (Signed)
Physical Therapy Evaluation Patient Details Name: Alisha Espinoza MRN: [id number] DOB: 05-Feb-1966 Today's Date: 05/08/2012 Time: 0920-0940 PT Time Calculation (min): 20 min  PT Assessment / Plan / Recommendation Clinical Impression  Pt is s/p ACDF who is Independent with basic mobility. Pt is ready for d/c home today and does not need any follow-up PT. All education is complete and pt is d/c from PT services.    PT Assessment  Patent does not need any further PT services    Follow Up Recommendations  No PT follow up    Does the patient have the potential to tolerate intense rehabilitation      Barriers to Discharge        Equipment Recommendations  None recommended by PT    Recommendations for Other Services     Frequency      Precautions / Restrictions Precautions Precautions: Cervical Required Braces or Orthoses: Cervical Brace Cervical Brace: Hard collar;Applied in supine position Restrictions Weight Bearing Restrictions: No   Pertinent Vitals/Pain       Mobility  Bed Mobility Bed Mobility: Right Sidelying to Sit Right Sidelying to Sit: 7: Independent Transfers Transfers: Sit to Stand Sit to Stand: 7: Independent Ambulation/Gait Ambulation/Gait Assistance: 7: Independent Ambulation Distance (Feet): 500 Feet Assistive device: None Gait Pattern: Within Functional Limits Stairs: Yes Stairs Assistance: 6: Modified independent (Device/Increase time) Stair Management Technique: Two rails Number of Stairs: 5     Exercises     PT Diagnosis:    PT Problem List:   PT Treatment Interventions:     PT Goals    Visit Information  Last PT Received On: 05/08/12    Subjective Data  Subjective: I am ready to go home today. Patient Stated Goal: To return to work.   Prior Functioning  Home Living Lives With: Alone Available Help at Discharge: Family Type of Home: Apartment Home Layout: Multi-level Alternate Level Stairs-Number of Steps: 12 Alternate Level  Stairs-Rails: Can reach both Prior Function Level of Independence: Independent Able to Take Stairs?: Yes Driving: Yes Vocation: Full time employment Communication Communication: No difficulties    Cognition  Cognition Overall Cognitive Status: Appears within functional limits for tasks assessed/performed Arousal/Alertness: Awake/alert Orientation Level: Appears intact for tasks assessed Behavior During Session: [facility] for tasks performed    Extremity/Trunk Assessment Right Lower Extremity Assessment RLE ROM/Strength/Tone: Within functional levels Left Lower Extremity Assessment LLE ROM/Strength/Tone: Within functional levels Trunk Assessment Trunk Assessment: Normal   Balance Balance Balance Assessed: Yes Static Standing Balance Static Standing - Balance Support: No upper extremity supported Static Standing - Level of Assistance: 7: Independent  End of Session PT - End of Session Equipment Utilized During Treatment: Cervical collar Activity Tolerance: Patient tolerated treatment well Patient left: in chair;with call bell/phone within reach Nurse Communication: Mobility status  GP Functional Assessment Tool Used: clinical judgement Functional Limitation: Mobility: Walking and moving around Mobility: Walking and Moving Around Current Status (N6295): At least 1 percent but less than 20 percent impaired, limited or restricted Mobility: Walking and Moving Around Goal Status [phone]): At least 1 percent but less than 20 percent impaired, limited or restricted Mobility: Walking and Moving Around Discharge Status [phone]): At least 1 percent but less than 20 percent impaired, limited or restricted   Greggory Stallion 05/08/2012, 9:47 AM

## 2012-05-08 NOTE — Progress Notes (Signed)
Patient discharged to home in stable condition. Discharge instructions and prescriptions were given and explained.

## 2012-05-08 NOTE — Discharge Summary (Signed)
Patient ID: Alisha Espinoza MRN: [id number] DOB/AGE: Sep 08, 1965 47 y.o.  Admit date: 05/07/2012 Discharge date: 05/08/2012  Admission Diagnoses:  Principal Problem:  *Fixation hardware in spine   Discharge Diagnoses:  Principal Problem:  *Fixation hardware in spine  status post Procedure(s): ANTERIOR CERVICAL DECOMPRESSION/DISCECTOMY FUSION 1 LEVEL/HARDWARE REMOVAL  Past Medical History  Diagnosis Date  . Bronchitis   . Heart murmur     "closed by age 61."    Surgeries: Procedure(s): ANTERIOR CERVICAL DECOMPRESSION/DISCECTOMY FUSION 1 LEVEL/HARDWARE REMOVAL on 05/07/2012   Consultants:  none  Discharged Condition: Improved  Hospital Course: Alisha Espinoza is an 47 y.o. female who was admitted 05/07/2012 for operative treatment of Fixation hardware in spine. Patient failed conservative treatments (please see the history and physical for the specifics) and had severe unremitting pain that affects sleep, daily activities and work/hobbies. After pre-op clearance, the patient was taken to the operating room on 05/07/2012 and underwent  Procedure(s): ANTERIOR CERVICAL DECOMPRESSION/DISCECTOMY FUSION 1 LEVEL/HARDWARE REMOVAL.    Patient was given perioperative antibiotics: Anti-infectives     Start     Dose/Rate Route Frequency Ordered Stop   05/07/12 1400   ceFAZolin (ANCEF) IVPB 1 g/50 mL premix        1 g 100 mL/hr over 30 Minutes Intravenous Every 8 hours 05/07/12 1139 05/07/12 2209   05/06/12 1420   ceFAZolin (ANCEF) IVPB 2 g/50 mL premix        2 g 100 mL/hr over 30 Minutes Intravenous 30 min pre-op 05/06/12 1420 05/07/12 0810           Patient was given sequential compression devices and early ambulation to prevent DVT.   Patient benefited maximally from hospital stay and there were no complications. At the time of discharge, the patient was urinating/moving their bowels without difficulty, tolerating a regular diet, pain is controlled with oral pain medications and they  have been cleared by PT/OT.   Recent vital signs: Patient Vitals for the past 24 hrs:  BP Temp Pulse Resp SpO2  05/08/12 0600 116/86 mmHg 98.5 F (36.9 C) 87  18  98 %  05/07/12 2113 142/81 mmHg 98.1 F (36.7 C) 78  18  95 %  05/07/12 1134 131/85 mmHg 97.4 F (36.3 C) 69  18  100 %  05/07/12 1111 - - 72  16  98 %  05/07/12 1103 130/97 mmHg - 85  16  99 %  05/07/12 1100 139/95 mmHg 97.6 F (36.4 C) 71  18  98 %  05/07/12 1045 146/87 mmHg - 78  22  99 %  05/07/12 1039 135/90 mmHg - 70  20  99 %  05/07/12 1030 110/94 mmHg - 79  23  98 %  05/07/12 1015 135/81 mmHg - 77  23  98 %  05/07/12 1008 135/90 mmHg 97.8 F (36.6 C) 89  8  100 %     Recent laboratory studies:  Basename 05/05/12 1554  WBC 8.4  HGB 14.9  HCT 43.1  PLT 220  NA --  K --  CL --  CO2 --  BUN --  CREATININE --  GLUCOSE --  INR --  CALCIUM --     Discharge Medications:     Medication List     As of 05/08/2012  8:07 AM    STOP taking these medications         HYDROcodone-acetaminophen 5-325 MG per tablet   Commonly known as: NORCO/VICODIN      TAKE these  medications         HYDROcodone-acetaminophen 10-325 MG per tablet   Commonly known as: NORCO   Take 1 tablet by mouth every 6 (six) hours as needed for pain.      methocarbamol 500 MG tablet   Commonly known as: ROBAXIN   Take 1 tablet (500 mg total) by mouth 3 (three) times daily as needed.      polyethylene glycol powder powder   Commonly known as: GLYCOLAX/MIRALAX   Take 17 g by mouth daily.        Diagnostic Studies: Dg Chest 2 View  05/05/2012  *RADIOLOGY REPORT*  Clinical Data: Preop ACDF  CHEST - 2 VIEW  Comparison: 11/28/2011  Findings: Lungs are essentially clear.  Possible mild right basilar atelectasis.  No pleural effusion or pneumothorax.  Cardiomediastinal silhouette is within normal limits.  Mild degenerative changes of the visualized thoracolumbar spine.  IMPRESSION: No evidence of acute cardiopulmonary disease.   Original  Report Authenticated By: Charline Bills, M.D.    Dg Cervical Spine 2-3 Views  05/07/2012  *RADIOLOGY REPORT*  Clinical Data: Hardware removal, C4-5 ACDF  CERVICAL SPINE - 2-3 VIEW  Comparison: 05/05/2012  Findings: The previously seen disc prosthesis at C4-5 has been removed with new interbody fusion and anterior fixation at the same level.  No acute abnormality is noted.   Original Report Authenticated By: Alcide Clever, M.D.    Dg Cervical Spine 2 Or 3 Views  05/07/2012  *RADIOLOGY REPORT*  Clinical Data: Cervical fusion.  CERVICAL SPINE - 2-3 VIEW  Comparison: 05/07/2012 intraoperative films.  Findings: The anterior and interbody fusion changes noted at C4-5. No complicating features.  IMPRESSION: C4-5 fusion.   Original Report Authenticated By: Rudie Meyer, M.D.    Dg Cervical Spine 2 Or 3 Views  05/05/2012  *RADIOLOGY REPORT*  Clinical Data: Preoperative films.  CERVICAL SPINE - 2-3 VIEW  Comparison: Plain films 03/19/2012.  Findings: Prosthetic disc at C4-5 is again seen.  There is some anterior displacement of the disc compared to the prior study.  No fracture is identified.  Prevertebral soft tissues appear mildly swollen.  No prevertebral gas collection is identified.  IMPRESSION: Mild anterior displacement of a prosthetic disc at C4-5 compared to the prior examination.  Prevertebral soft tissues appear somewhat swollen.   Original Report Authenticated By: Holley Dexter, M.D.    Ct Cervical Spine Wo Contrast  05/06/2012  *RADIOLOGY REPORT*  Clinical Data: Preop hardware repair.  Complaining of neck pain.  CT CERVICAL SPINE WITHOUT CONTRAST  Technique:  Multidetector CT imaging of the cervical spine was performed. Multiplanar CT image reconstructions were also generated.  Comparison: 05/05/2012 and 03/19/2012 plain film examination  Findings: Present examination incorporates from the lower cerebellum to the T2 level.  Prostatic disc has been placed at the C4-5 level.  When compared to the  03/19/2012 plain film examination, the prostatic disc has slipped anteriorly.  Significant streak artifact at this level. Central spur arises from the posterior inferior aspect of the C4 vertebra and small spur arises from the left paracentral posterior- superior aspect of the C5 vertebra.  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  Evaluation below the C5 levels limited by artifact.  Carotid bifurcation calcifications advance for patient's age.  Lung apices are clear.  IMPRESSION: Prostatic disc has been placed at the C4-5 level.  When compared to the 03/19/2012 plain film examination, the prostatic disc has slipped anteriorly.  Significant streak artifact at this level.  Central spur  arises from the posterior inferior aspect of the C4 vertebra and small spur arises from the left paracentral posterior- superior aspect of the C5 vertebra.  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  Evaluation below the C5 levels limited by shoulder artifact.   Original Report Authenticated By: Lacy Duverney, M.D.         Discharge Plan:  discharge to home  Disposition: stable  Doing well  F/u 2 weeks    Signed: Venita Lick D for Dr. Venita Lick [facility] [phone] 05/08/2012, 8:07 AM

## 2012-05-12 ENCOUNTER — Encounter (HOSPITAL_COMMUNITY): Payer: Self-pay | Admitting: Orthopedic Surgery

## 2012-06-18 ENCOUNTER — Emergency Department (HOSPITAL_COMMUNITY)
Admission: EM | Admit: 2012-06-18 | Discharge: 2012-06-19 | Discharge status: Against Medical Advice | Payer: Self-pay | Attending: Emergency Medicine | Admitting: Emergency Medicine

## 2012-06-18 ENCOUNTER — Encounter (HOSPITAL_COMMUNITY): Payer: Self-pay | Admitting: Emergency Medicine

## 2012-06-18 DIAGNOSIS — J029 Acute pharyngitis, unspecified: Secondary | ICD-10-CM | POA: Insufficient documentation

## 2012-06-18 NOTE — ED Notes (Signed)
PT. REPORTS SORE THROAT THIS EVENING , DENIES RECENT INJURY , RESPIRATIONS UNLABORED , AIRWAY INTACT , STATES HISTORY OF CERVICAL NECK SURGERY LAST 05/07/2012 BY DR. Shon Baton.

## 2012-06-19 NOTE — ED Notes (Signed)
Pt st's she is leaving and going to [facility]

## 2012-07-27 ENCOUNTER — Emergency Department (HOSPITAL_COMMUNITY)
Admission: EM | Admit: 2012-07-27 | Discharge: 2012-07-27 | Disposition: A | Discharge status: Home / Self Care | Payer: Self-pay | Attending: Emergency Medicine | Admitting: Emergency Medicine

## 2012-07-27 ENCOUNTER — Emergency Department (HOSPITAL_COMMUNITY): Payer: Self-pay

## 2012-07-27 ENCOUNTER — Encounter (HOSPITAL_COMMUNITY): Payer: Self-pay | Admitting: Emergency Medicine

## 2012-07-27 DIAGNOSIS — Z90710 Acquired absence of both cervix and uterus: Secondary | ICD-10-CM | POA: Insufficient documentation

## 2012-07-27 DIAGNOSIS — Z981 Arthrodesis status: Secondary | ICD-10-CM | POA: Insufficient documentation

## 2012-07-27 DIAGNOSIS — Z8709 Personal history of other diseases of the respiratory system: Secondary | ICD-10-CM | POA: Insufficient documentation

## 2012-07-27 DIAGNOSIS — N23 Unspecified renal colic: Secondary | ICD-10-CM | POA: Insufficient documentation

## 2012-07-27 DIAGNOSIS — F17200 Nicotine dependence, unspecified, uncomplicated: Secondary | ICD-10-CM | POA: Insufficient documentation

## 2012-07-27 DIAGNOSIS — R110 Nausea: Secondary | ICD-10-CM | POA: Insufficient documentation

## 2012-07-27 DIAGNOSIS — Z8679 Personal history of other diseases of the circulatory system: Secondary | ICD-10-CM | POA: Insufficient documentation

## 2012-07-27 DIAGNOSIS — N200 Calculus of kidney: Secondary | ICD-10-CM | POA: Insufficient documentation

## 2012-07-27 LAB — COMPREHENSIVE METABOLIC PANEL
ALT: 18 U/L (ref 0–35)
AST: 18 U/L (ref 0–37)
Albumin: 4 g/dL (ref 3.5–5.2)
Alkaline Phosphatase: 80 U/L (ref 39–117)
BUN: 13 mg/dL (ref 6–23)
CO2: 22 mEq/L (ref 19–32)
Calcium: 9.4 mg/dL (ref 8.4–10.5)
Chloride: 102 mEq/L (ref 96–112)
Creatinine, Ser: 0.84 mg/dL (ref 0.50–1.10)
GFR calc Af Amer: >90 mL/min (ref >90)
GFR calc non Af Amer: 81 mL/min — ABNORMAL LOW (ref >90)
Glucose, Bld: 116 mg/dL — ABNORMAL HIGH (ref 70–99)
Potassium: 3.7 mEq/L (ref 3.5–5.1)
Sodium: 134 mEq/L — ABNORMAL LOW (ref 135–145)
Total Bilirubin: 0.2 mg/dL — ABNORMAL LOW (ref 0.3–1.2)
Total Protein: 7.1 g/dL (ref 6.0–8.3)

## 2012-07-27 LAB — CBC
HCT: 43 % (ref 36.0–46.0)
Hemoglobin: 14.7 g/dL (ref 12.0–15.0)
MCH: 26.7 pg (ref 26.0–34.0)
MCHC: 34.2 g/dL (ref 30.0–36.0)
MCV: 78.2 fL (ref 78.0–100.0)
Platelets: 223 10*3/uL (ref 150–400)
RBC: 5.5 MIL/uL — ABNORMAL HIGH (ref 3.87–5.11)
RDW: 14.8 % (ref 11.5–15.5)
WBC: 6.8 10*3/uL (ref 4.0–10.5)

## 2012-07-27 LAB — URINALYSIS, ROUTINE W REFLEX MICROSCOPIC
Bilirubin Urine: NEGATIVE
Glucose, UA: NEGATIVE mg/dL
Ketones, ur: NEGATIVE mg/dL
Nitrite: NEGATIVE
Protein, ur: NEGATIVE mg/dL
Specific Gravity, Urine: 1.018 (ref 1.005–1.030)
Urobilinogen, UA: 0.2 mg/dL (ref 0.0–1.0)
pH: 5.5 (ref 5.0–8.0)

## 2012-07-27 LAB — URINE MICROSCOPIC-ADD ON

## 2012-07-27 LAB — LIPASE, BLOOD: Lipase: 26 U/L (ref 11–59)

## 2012-07-27 MED ORDER — ONDANSETRON HCL 4 MG/2ML IJ SOLN
4.0000 mg | Freq: Once | INTRAMUSCULAR | Status: AC
  Administered 2012-07-27: 4 mg via INTRAVENOUS
  Filled 2012-07-27: qty 2

## 2012-07-27 MED ORDER — OXYCODONE-ACETAMINOPHEN 5-325 MG PO TABS: 1.0000 | ORAL_TABLET | Freq: Four times a day (QID) | ORAL | Status: DC | PRN

## 2012-07-27 MED ORDER — SODIUM CHLORIDE 0.9 % IV SOLN
INTRAVENOUS | Status: DC
  Administered 2012-07-27: 10:00:00 via INTRAVENOUS

## 2012-07-27 MED ORDER — HYDROMORPHONE HCL PF 1 MG/ML IJ SOLN
1.0000 mg | Freq: Once | INTRAMUSCULAR | Status: AC
  Administered 2012-07-27: 1 mg via INTRAVENOUS
  Filled 2012-07-27: qty 1

## 2012-07-27 MED ORDER — ONDANSETRON HCL 8 MG PO TABS: 8.0000 mg | ORAL_TABLET | Freq: Three times a day (TID) | ORAL | Status: DC | PRN

## 2012-07-27 NOTE — ED Provider Notes (Signed)
History     CSN: [id number]  Arrival date & time 07/27/12  7829   First MD Initiated Contact with Patient 07/27/12 0940      Chief Complaint  Patient presents with  . Abdominal Pain    (Consider location/radiation/quality/duration/timing/severity/associated sxs/prior treatment) Patient is a 47 y.o. female presenting with abdominal pain. The history is provided by the patient.  Abdominal Pain Associated symptoms: no chest pain, no chills, no diarrhea, no fever, no shortness of breath and no vomiting   pt c/o right flank pain, acute onset this morning. Constant, dull. Waxes and wanes in severity. Mod-severe. No hx same pain. Denies trauma or fall. No musculoskeletal strain. Had normal bm today. Nausea. No vomiting. No diarrhea or constipation. No fever or chills. No dysuria or hematuria. No hx kidney stone. No hx gallstones. Only prior abd surgery is hysterectomy. Hx cervical ddd, surgery for same. No lumbar area pain or radicular pain.   Past Medical History  Diagnosis Date  . Bronchitis   . Heart murmur     "closed by age 70."    Past Surgical History  Procedure Laterality Date  . Abdominal hysterectomy    . Rotator cuff repair      Right  . Cystectomy      L wrist  . Tympanostomy tube placement    . Anterior cervical decomp/discectomy fusion  03/19/2012    Procedure: ANTERIOR CERVICAL DECOMPRESSION/DISCECTOMY FUSION 1 LEVEL;  Surgeon: Venita Lick, MD;  Location: [facility] OR;  Service: Orthopedics;  Laterality: Left;  Total Disc Replacement C4-5  . Cervical fusion  05/07/2012    Dr Shon Baton  . Anterior cervical decomp/discectomy fusion N/A 05/07/2012    Procedure: ANTERIOR CERVICAL DECOMPRESSION/DISCECTOMY FUSION 1 LEVEL/HARDWARE REMOVAL;  Surgeon: Venita Lick, MD;  Location: [facility] OR;  Service: Orthopedics;  Laterality: N/A;  REMOVAL OF CERVICAL DISC REPLACEMENT AND ACDF C4-5    Family History  Problem Relation Age of Onset  . Diabetes Mother   . Hypertension Father      History  Substance Use Topics  . Smoking status: Current Every Day Smoker -- 0.50 packs/day for 20 years    Types: Cigarettes  . Smokeless tobacco: Never Used  . Alcohol Use: Yes     Comment: occasional    OB History   Grav Para Term Preterm Abortions TAB SAB Ect Mult Living                  Review of Systems  Constitutional: Negative for fever and chills.  HENT: Negative for neck pain.   Eyes: Negative for redness.  Respiratory: Negative for shortness of breath.   Cardiovascular: Negative for chest pain.  Gastrointestinal: Positive for abdominal pain. Negative for vomiting and diarrhea.  Genitourinary: Positive for flank pain.  Musculoskeletal: Negative for myalgias.  Skin: Negative for rash.  Neurological: Negative for weakness, numbness and headaches.  Hematological: Does not bruise/bleed easily.  Psychiatric/Behavioral: Negative for confusion.    Allergies  Celebrex and Meloxicam  Home Medications   Current Outpatient Rx  Name  Route  Sig  Dispense  Refill  . albuterol (PROVENTIL HFA;VENTOLIN HFA) 108 (90 BASE) MCG/ACT inhaler   Inhalation   Inhale 2 puffs into the lungs every 6 (six) hours as needed for wheezing.         Marland Kitchen ibuprofen (ADVIL,MOTRIN) 200 MG tablet   Oral   Take 400 mg by mouth every 6 (six) hours as needed for pain.  BP 155/102  Pulse 82  Temp(Src) 98.7 F (37.1 C)  Resp 20  SpO2 100%  Physical Exam  Nursing note and vitals reviewed. Constitutional: She appears well-developed and well-nourished. No distress.  HENT:  Head: Atraumatic.  Mouth/Throat: Oropharynx is clear and moist.  Eyes: Conjunctivae are normal. No scleral icterus.  Neck: Neck supple. No tracheal deviation present.  Cardiovascular: Normal rate, regular rhythm, normal heart sounds and intact distal pulses.   Pulmonary/Chest: Effort normal and breath sounds normal. No respiratory distress.  Abdominal: Soft. Normal appearance and bowel sounds are normal.  She exhibits no distension and no mass. There is no tenderness. There is no rebound and no guarding.  No hernia  Genitourinary:  No cva tenderness  Musculoskeletal: She exhibits no edema.  TLS spine, non tender, aligned, no step off.   Neurological: She is alert.  Skin: Skin is warm and dry. No rash noted.  No shingles/rash in area of pain  Psychiatric: She has a normal mood and affect.    ED Course  Procedures (including critical care time)   Results for orders placed during the hospital encounter of 07/27/12  CBC      Result Value Range   WBC 6.8  4.0 - 10.5 K/uL   RBC 5.50 (*) 3.87 - 5.11 MIL/uL   Hemoglobin 14.7  12.0 - 15.0 g/dL   HCT 16.1  09.6 - 04.5 %   MCV 78.2  78.0 - 100.0 fL   MCH 26.7  26.0 - 34.0 pg   MCHC 34.2  30.0 - 36.0 g/dL   RDW 40.9  81.1 - 91.4 %   Platelets 223  150 - 400 K/uL  COMPREHENSIVE METABOLIC PANEL      Result Value Range   Sodium 134 (*) 135 - 145 mEq/L   Potassium 3.7  3.5 - 5.1 mEq/L   Chloride 102  96 - 112 mEq/L   CO2 22  19 - 32 mEq/L   Glucose, Bld 116 (*) 70 - 99 mg/dL   BUN 13  6 - 23 mg/dL   Creatinine, Ser 7.82  0.50 - 1.10 mg/dL   Calcium 9.4  8.4 - 95.6 mg/dL   Total Protein 7.1  6.0 - 8.3 g/dL   Albumin 4.0  3.5 - 5.2 g/dL   AST 18  0 - 37 U/L   ALT 18  0 - 35 U/L   Alkaline Phosphatase 80  39 - 117 U/L   Total Bilirubin 0.2 (*) 0.3 - 1.2 mg/dL   GFR calc non Af Amer 81 (*) >90 mL/min   GFR calc Af Amer >90  >90 mL/min  LIPASE, BLOOD      Result Value Range   Lipase 26  11 - 59 U/L   Ct Abdomen Pelvis Wo Contrast  07/27/2012  *RADIOLOGY REPORT*  Clinical Data: 47 year old female with right flank pain.  CT ABDOMEN AND PELVIS WITHOUT CONTRAST  Technique:  Multidetector CT imaging of the abdomen and pelvis was performed following the standard protocol without intravenous contrast.  Comparison: None.  Findings: Cardiac size at the upper limits of normal.  Mild dependent pulmonary atelectasis.  No pleural or pericardial  effusion.  Disc and endplate degeneration in the lumbar spine. No acute osseous abnormality identified.  No pelvic free fluid.  Negative distal colon.  Negative left colon. Negative transverse colon.  The cecum is partially located in the right hemi pelvis.  Normal appendix open (coronal image 60). Terminal ileum within normal limits.  No dilated small  bowel. Stomach and duodenum decompressed.  The uterus is surgically absent.  The adnexa remain and are within normal limits; 829 mm low density area at the left adnexa likely is a physiologic cyst.  Additional smaller low density left adnexal area is.  Negative noncontrast liver, gallbladder, spleen, and pancreas.  Both adrenal glands appear thickened and indistinct.  No discrete adrenal mass identified.  No abdominal free fluid.  Mild calcified atherosclerosis of the distal aorta and iliac arteries.  No left hydronephrosis, perinephric stranding, nephrolithiasis, or hydroureter.  Negative course of the left ureter.  Mild to moderate right hydronephrosis.  Mild perinephric stranding. Stranding at the right renal pelvis and about the right ureter. Mild to moderate hydroureter continues in the pelvis to the ureteral vesicle junction where there is a 2-3 mm calculus.  The bladder otherwise is unremarkable.  No other urologic calculus identified.  Pelvic phleboliths.  IMPRESSION: 1.  Acute obstructive uropathy on the right with a 2-3 mm obstructing calculus at the right UVJ.  No other urologic calculus identified. 2.  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  Query endocrinopathy. 3.  Negative appendix. 4.  Status post hysterectomy.  Probable left adnexal physiologic cysts.   Original Report Authenticated By: Erskine Speed, M.D.       MDM  Iv ns. Dilaudid 1 mg iv. zofran iv. Labs. Ct.  Reviewed nursing notes and prior charts for additional history.   Recheck pain better but persists. Recheck abd soft nt.    Discussed ct w pt.  Allergy to celebrex.  Dilaudid iv.  Recheck pt comfortable.         Suzi Roots, MD 07/29/12 [phone]

## 2012-07-27 NOTE — ED Notes (Signed)
Received pt from home with c/o abdominal pain onset 0800. Per EMS pain was bearable until arrival to ED. Pt denies N/V/D. Last BM today, normal.

## 2012-07-28 LAB — URINE CULTURE
Colony Count: NO GROWTH
Culture: NO GROWTH

## 2012-08-05 ENCOUNTER — Inpatient Hospital Stay
Admission: RE | Admit: 2012-08-05 | Discharge: 2012-08-05 | Disposition: A | Discharge status: Home / Self Care | Payer: Self-pay | Source: Ambulatory Visit | Attending: Orthopedic Surgery | Admitting: Orthopedic Surgery

## 2012-08-05 ENCOUNTER — Other Ambulatory Visit: Payer: Self-pay | Admitting: Orthopedic Surgery

## 2012-08-05 DIAGNOSIS — R52 Pain, unspecified: Secondary | ICD-10-CM

## 2012-10-26 ENCOUNTER — Emergency Department (HOSPITAL_COMMUNITY)
Admission: EM | Admit: 2012-10-26 | Discharge: 2012-10-26 | Disposition: A | Discharge status: Home / Self Care | Payer: Worker's Compensation | Attending: Emergency Medicine | Admitting: Emergency Medicine

## 2012-10-26 ENCOUNTER — Encounter (HOSPITAL_COMMUNITY): Payer: Self-pay | Admitting: Cardiology

## 2012-10-26 DIAGNOSIS — R51 Headache: Secondary | ICD-10-CM | POA: Insufficient documentation

## 2012-10-26 DIAGNOSIS — Z8679 Personal history of other diseases of the circulatory system: Secondary | ICD-10-CM | POA: Insufficient documentation

## 2012-10-26 DIAGNOSIS — Z8709 Personal history of other diseases of the respiratory system: Secondary | ICD-10-CM | POA: Insufficient documentation

## 2012-10-26 DIAGNOSIS — M542 Cervicalgia: Secondary | ICD-10-CM | POA: Insufficient documentation

## 2012-10-26 DIAGNOSIS — F17200 Nicotine dependence, unspecified, uncomplicated: Secondary | ICD-10-CM | POA: Insufficient documentation

## 2012-10-26 MED ORDER — DIPHENHYDRAMINE HCL 25 MG PO CAPS
50.0000 mg | ORAL_CAPSULE | Freq: Once | ORAL | Status: AC
  Administered 2012-10-26: 50 mg via ORAL
  Filled 2012-10-26: qty 2

## 2012-10-26 MED ORDER — OXYCODONE-ACETAMINOPHEN 5-325 MG PO TABS: 2.0000 | ORAL_TABLET | Freq: Four times a day (QID) | ORAL | Status: DC | PRN

## 2012-10-26 MED ORDER — DEXAMETHASONE SODIUM PHOSPHATE 10 MG/ML IJ SOLN
10.0000 mg | Freq: Once | INTRAMUSCULAR | Status: AC
  Administered 2012-10-26: 10 mg via INTRAMUSCULAR
  Filled 2012-10-26: qty 1

## 2012-10-26 MED ORDER — KETOROLAC TROMETHAMINE 60 MG/2ML IM SOLN
60.0000 mg | Freq: Once | INTRAMUSCULAR | Status: AC
  Administered 2012-10-26: 60 mg via INTRAMUSCULAR
  Filled 2012-10-26: qty 2

## 2012-10-26 MED ORDER — PROMETHAZINE HCL 25 MG PO TABS: 25.0000 mg | ORAL_TABLET | Freq: Four times a day (QID) | ORAL | Status: DC | PRN

## 2012-10-26 NOTE — ED Provider Notes (Signed)
CSN: [id number]     Arrival date & time 10/26/12  1647 History    This chart was scribed for non-physician practitioner Junious Silk PA-C, working with Bonnita Levan. Bernette Mayers, MD by Donne Anon, ED Scribe. This patient was seen in room TR11C/TR11C and the patient's care was started at 1847.   First MD Initiated Contact with Patient 10/26/12 1847     Chief Complaint  Patient presents with  . Neck Pain  . Headache    The history is provided by the patient. No language interpreter was used.   HPI Comments: Alisha Espinoza is a 47 y.o. 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 She describes the pain as "tight." She states she had surgery on her C4 in March. Movement and turning her head, especially to the left, to the side worsen the pain. Laying on her side makes the pain better. She has tried Advil and Bengay with mild relief. She denies recent trauma or injury. She denies weakness, tingling or numbness in her arms. Denies incontinence. Denies a hx of cancer or drug use.   Dr. Shon Baton with [facility] orthoped[facility] is her surgeon.  Past Medical History  Diagnosis Date  . Bronchitis   . Heart murmur     "closed by age 52."   Past Surgical History  Procedure Laterality Date  . Abdominal hysterectomy    . Rotator cuff repair      Right  . Cystectomy      L wrist  . Tympanostomy tube placement    . Anterior cervical decomp/discectomy fusion  03/19/2012    Procedure: ANTERIOR CERVICAL DECOMPRESSION/DISCECTOMY FUSION 1 LEVEL;  Surgeon: Venita Lick, MD;  Location: [facility] OR;  Service: Orthopedics;  Laterality: Left;  Total Disc Replacement C4-5  . Cervical fusion  05/07/2012    Dr Shon Baton  . Anterior cervical decomp/discectomy fusion N/A 05/07/2012    Procedure: ANTERIOR CERVICAL DECOMPRESSION/DISCECTOMY FUSION 1 LEVEL/HARDWARE REMOVAL;  Surgeon: Venita Lick, MD;  Location: [facility]  OR;  Service: Orthopedics;  Laterality: N/A;  REMOVAL OF CERVICAL DISC REPLACEMENT AND ACDF C4-5   Family History  Problem Relation Age of Onset  . Diabetes Mother   . Hypertension Father    History  Substance Use Topics  . Smoking status: Current Every Day Smoker -- 0.50 packs/day for 20 years    Types: Cigarettes  . Smokeless tobacco: Never Used  . Alcohol Use: Yes     Comment: occasional   OB History   Grav Para Term Preterm Abortions TAB SAB Ect Mult Living                 Review of Systems  HENT: Positive for neck pain.   Neurological: Positive for headaches. Negative for weakness and numbness.  All other systems reviewed and are negative.    Allergies  Celebrex and Meloxicam  Home Medications   Current Outpatient Rx  Name  Route  Sig  Dispense  Refill  . ibuprofen (ADVIL,MOTRIN) 200 MG tablet   Oral   Take 400 mg by mouth every 6 (six) hours as needed for pain.          BP 126/83  Pulse 92  Temp(Src) 98 F (36.7 C) (Oral)  Resp 16  SpO2 100%  Physical Exam  Nursing note and vitals reviewed. Constitutional: She is oriented to person, place, and time. She appears well-developed and well-nourished. No  distress.  HENT:  Head: Normocephalic and atraumatic.  Right Ear: External ear normal.  Left Ear: External ear normal.  Nose: Nose normal.  Mouth/Throat: Oropharynx is clear and moist.  Eyes: Conjunctivae and EOM are normal. Pupils are equal, round, and reactive to light.  Neck: Normal range of motion.  Cardiovascular: Normal rate, regular rhythm and normal heart sounds.   Pulmonary/Chest: Effort normal and breath sounds normal. No stridor. No respiratory distress. She has no wheezes. She has no rales.  Abdominal: Soft. She exhibits no distension.  Musculoskeletal: Normal range of motion.  Tender to palpation of paraspinal muscles around cervical spine. Strength 5/5. Neurovascularly intact.  Neurological: She is alert and oriented to person, place, and  time. She has normal strength.  Skin: Skin is warm and dry. She is not diaphoretic. No erythema.  Psychiatric: She has a normal mood and affect. Her behavior is normal.    ED Course   Procedures (including critical care time) DIAGNOSTIC STUDIES: Oxygen Saturation is 100% on RA, normal by my interpretation.    COORDINATION OF CARE: 8:10 PM Discussed treatment plan which includes Toradol and a steroid shot with pt at bedside and pt agreed to plan. Advised pt to follow up with Dr. Shon Baton.    Labs Reviewed - No data to display No results found. 1. Neck pain     MDM  Patient with neck pain after cervical spine discectomy. Has follow up appointment with Dr. Shon Baton. Toradol, decadron, and benadryl in ED. Given small course of pain medication. TTP in paraspinal muscles. No signs of cervical radiculopathy. Strength WNL. Return instructions given. Vital signs stable for discharge. Patient / Family / Caregiver informed of clinical course, understand medical decision-making process, and agree with plan.   Medications  dexamethasone (DECADRON) injection 10 mg (10 mg Intramuscular Given 10/26/12 2021)  ketorolac (TORADOL) injection 60 mg (60 mg Intramuscular Given 10/26/12 2021)  diphenhydrAMINE (BENADRYL) capsule 50 mg (50 mg Oral Given 10/26/12 2021)     I personally performed the services described in this documentation, which was scribed in my presence. The recorded information has been reviewed and is accurate.    Mora Bellman, PA-C 10/27/12 [phone]

## 2012-10-26 NOTE — ED Notes (Signed)
Pt reports neck pain and headache. States that she had surgery on her C4 back in March. States that she started hurting again on Friday and the pain radiates into her back. States increased pain with movement and turning her head side to side.

## 2012-10-27 NOTE — ED Provider Notes (Signed)
Medical screening examination/treatment/procedure(s) were performed by non-physician practitioner and as supervising physician I was immediately available for consultation/collaboration.   Charles B. Bernette Mayers, MD 10/27/12 [phone]

## 2013-05-04 ENCOUNTER — Emergency Department (HOSPITAL_COMMUNITY)
Admission: EM | Admit: 2013-05-04 | Discharge: 2013-05-05 | Disposition: A | Discharge status: Home / Self Care | Payer: BC Managed Care – PPO | Attending: Emergency Medicine | Admitting: Emergency Medicine

## 2013-05-04 ENCOUNTER — Encounter (HOSPITAL_COMMUNITY): Payer: Self-pay | Admitting: Emergency Medicine

## 2013-05-04 DIAGNOSIS — R5381 Other malaise: Secondary | ICD-10-CM | POA: Insufficient documentation

## 2013-05-04 DIAGNOSIS — J069 Acute upper respiratory infection, unspecified: Secondary | ICD-10-CM

## 2013-05-04 DIAGNOSIS — R5383 Other fatigue: Secondary | ICD-10-CM | POA: Insufficient documentation

## 2013-05-04 DIAGNOSIS — Z79899 Other long term (current) drug therapy: Secondary | ICD-10-CM | POA: Insufficient documentation

## 2013-05-04 DIAGNOSIS — R011 Cardiac murmur, unspecified: Secondary | ICD-10-CM | POA: Insufficient documentation

## 2013-05-04 DIAGNOSIS — F17200 Nicotine dependence, unspecified, uncomplicated: Secondary | ICD-10-CM | POA: Insufficient documentation

## 2013-05-04 DIAGNOSIS — R52 Pain, unspecified: Secondary | ICD-10-CM | POA: Insufficient documentation

## 2013-05-04 MED ORDER — PSEUDOEPHEDRINE HCL ER 120 MG PO TB12
120.0000 mg | ORAL_TABLET | Freq: Two times a day (BID) | ORAL | Status: DC
  Administered 2013-05-04: 120 mg via ORAL
  Filled 2013-05-04: qty 1

## 2013-05-04 MED ORDER — PSEUDOEPHEDRINE HCL ER 120 MG PO TB12: 120.0000 mg | ORAL_TABLET | Freq: Two times a day (BID) | ORAL | Status: DC

## 2013-05-04 NOTE — Discharge Instructions (Signed)
Cool Mist Vaporizers °Vaporizers may help relieve the symptoms of a cough and cold. They add moisture to the air, which helps mucus to become thinner and less sticky. This makes it easier to breathe and cough up secretions. Cool mist vaporizers do not cause serious burns like hot mist vaporizers ("steamers, humidifiers"). Vaporizers have not been proved to show they help with colds. You should not use a vaporizer if you are allergic to mold.  °HOME CARE INSTRUCTIONS °· Follow the package instructions for the vaporizer. °· Do not use anything other than distilled water in the vaporizer. °· Do not run the vaporizer all of the time. This can cause mold or bacteria to grow in the vaporizer. °· Clean the vaporizer after each time it is used. °· Clean and dry the vaporizer well before storing it. °· Stop using the vaporizer if worsening respiratory symptoms develop. °Document Released: 12/14/2003 Document Revised: 11/18/2012 Document Reviewed: 08/05/2012 °ExitCare® Patient Information ©2014 [employer]. ° °Cough, Adult ° A cough is a reflex. It helps you clear your throat and airways. A cough can help heal your body. A cough can last 2 or 3 weeks (acute) or may last more than 8 weeks (chronic). Some common causes of a cough can include an infection, allergy, or a cold. °HOME CARE °· Only take medicine as told by your doctor. °· If given, take your medicines (antibiotics) as told. Finish them even if you start to feel better. °· Use a cold steam vaporizer or humidier in your home. This can help loosen thick spit (secretions). °· Sleep so you are almost sitting up (semi-upright). Use pillows to do this. This helps reduce coughing. °· Rest as needed. °· Stop smoking if you smoke. °GET HELP RIGHT AWAY IF: °· You have yellowish-white fluid (pus) in your thick spit. °· Your cough gets worse. °· Your medicine does not reduce coughing, and you are losing sleep. °· You cough up blood. °· You have trouble breathing. °· Your pain  gets worse and medicine does not help. °· You have a fever. °MAKE SURE YOU:  °· Understand these instructions. °· Will watch your condition. °· Will get help right away if you are not doing well or get worse. °Document Released: 11/29/2010 Document Revised: 06/10/2011 Document Reviewed: 11/29/2010 °ExitCare® Patient Information ©2014 [employer]. ° °

## 2013-05-04 NOTE — ED Provider Notes (Signed)
CSN: [id number]     Arrival date & time 05/04/13  2239 History  This chart was scribed for non-physician practitioner Garald Balding, NP working with Mirna Mires, MD by Adriana Reams, ED Scribe. This patient was seen in room [place] and the patient's care was started at 11:08 PM.    Chief Complaint  Patient presents with  . Cough  . Generalized Body Aches    The history is provided by the patient. No language interpreter was used.   HPI Comments: Alisha Espinoza is a 48 y.o. female who presents to the Emergency Department complaining of 2 days of gradual onset, gradually worsening, moderate productive cough with yellow sputum, nasal congestion, fatigue and generalized body aches. Her symptoms did not all start at the same time. Laying down makes the symptoms worse. She has tried Asprin and Primatene tables for her symptoms with little relief. She denies fever any other symptoms. She did not get a flu shot this year.   She does not currently have a PCP.   Past Medical History  Diagnosis Date  . Bronchitis   . Heart murmur     "closed by age 80."   Past Surgical History  Procedure Laterality Date  . Abdominal hysterectomy    . Rotator cuff repair      Right  . Cystectomy      L wrist  . Tympanostomy tube placement    . Anterior cervical decomp/discectomy fusion  03/19/2012    Procedure: ANTERIOR CERVICAL DECOMPRESSION/DISCECTOMY FUSION 1 LEVEL;  Surgeon: Melina Schools, MD;  Location: [place];  Service: Orthopedics;  Laterality: Left;  Total Disc Replacement C4-5  . Cervical fusion  05/07/2012    Dr Rolena Infante  . Anterior cervical decomp/discectomy fusion N/A 05/07/2012    Procedure: ANTERIOR CERVICAL DECOMPRESSION/DISCECTOMY FUSION 1 LEVEL/HARDWARE REMOVAL;  Surgeon: Melina Schools, MD;  Location: [place];  Service: Orthopedics;  Laterality: N/A;  REMOVAL OF CERVICAL DISC REPLACEMENT AND ACDF C4-5   Family History  Problem Relation Age of Onset  . Diabetes Mother   . Hypertension Father     History  Substance Use Topics  . Smoking status: Current Every Day Smoker -- 0.50 packs/day for 20 years    Types: Cigarettes  . Smokeless tobacco: Never Used  . Alcohol Use: Yes     Comment: occasional   OB History   Grav Para Term Preterm Abortions TAB SAB Ect Mult Living                 Review of Systems  Constitutional: Positive for fatigue.  HENT: Positive for congestion.   Respiratory: Positive for cough.   All other systems reviewed and are negative.    Allergies  Celebrex and Meloxicam  Home Medications   Current Outpatient Rx  Name  Route  Sig  Dispense  Refill  . aspirin 325 MG tablet   Oral   Take 1,300 mg by mouth every 6 (six) hours as needed for moderate pain.         Marland Kitchen ibuprofen (ADVIL,MOTRIN) 200 MG tablet   Oral   Take 400 mg by mouth every 6 (six) hours as needed for pain.         . pseudoephedrine (SUDAFED 12 HOUR) 120 MG 12 hr tablet   Oral   Take 1 tablet (120 mg total) by mouth 2 (two) times daily.   18 tablet   0    BP 146/96  Pulse 97  Temp(Src) 99.8 F (37.7 C) (Oral)  Resp 17  Ht 5' 6.5" (1.689 m)  Wt 195 lb (88.451 kg)  BMI 31.01 kg/m2  SpO2 100%  Physical Exam  Nursing note and vitals reviewed. Constitutional: She appears well-developed and well-nourished. No distress.  HENT:  Head: Normocephalic and atraumatic.  Eyes: Conjunctivae are normal. Pupils are equal, round, and reactive to light.  Neck: Neck supple. No tracheal deviation present.  Cardiovascular: Normal rate, regular rhythm and normal heart sounds.   Pulmonary/Chest: Effort normal and breath sounds normal. No respiratory distress. She has no wheezes. She has no rales.  Abdominal: Soft.  Musculoskeletal: Normal range of motion.  Neurological: She is alert.  Skin: Skin is warm and dry.  Psychiatric: She has a normal mood and affect. Her behavior is normal.    ED Course  Procedures (including critical care time) DIAGNOSTIC STUDIES: Oxygen Saturation is  100% on RA, normal by my interpretation.    COORDINATION OF CARE: 11:06 PM Discussed treatment plan with pt at bedside and pt agreed to plan.    Labs Review Labs Reviewed - No data to display Imaging Review No results found.  EKG Interpretation   None       MDM   1. URI (upper respiratory infection)       I personally performed the services described in this documentation, which was scribed in my presence. The recorded information has been reviewed and is accurate.    Garald Balding, NP 05/04/13 [phone]

## 2013-05-04 NOTE — ED Notes (Signed)
Pt c/o cough, weakness, and body aches since yesterday. Pt states she is coughing up a little sputum. Pt states she has taken ASA and Primatene tablets for symptoms.  Pt denies other symptoms. Pt alert, no acute distress. Skin warm and dry.

## 2013-05-05 ENCOUNTER — Encounter (HOSPITAL_COMMUNITY): Payer: Self-pay | Admitting: Emergency Medicine

## 2013-05-05 DIAGNOSIS — J111 Influenza due to unidentified influenza virus with other respiratory manifestations: Secondary | ICD-10-CM | POA: Insufficient documentation

## 2013-05-05 DIAGNOSIS — F17200 Nicotine dependence, unspecified, uncomplicated: Secondary | ICD-10-CM | POA: Insufficient documentation

## 2013-05-05 DIAGNOSIS — J209 Acute bronchitis, unspecified: Secondary | ICD-10-CM | POA: Insufficient documentation

## 2013-05-05 DIAGNOSIS — Z79899 Other long term (current) drug therapy: Secondary | ICD-10-CM | POA: Insufficient documentation

## 2013-05-05 DIAGNOSIS — Z7982 Long term (current) use of aspirin: Secondary | ICD-10-CM | POA: Insufficient documentation

## 2013-05-05 DIAGNOSIS — R011 Cardiac murmur, unspecified: Secondary | ICD-10-CM | POA: Insufficient documentation

## 2013-05-05 NOTE — ED Provider Notes (Signed)
Medical screening examination/treatment/procedure(s) were performed by non-physician practitioner and as supervising physician I was immediately available for consultation/collaboration.    Kathalene Frames, MD 05/05/13 [phone]

## 2013-05-05 NOTE — ED Notes (Addendum)
Pt reports runny nose, sob, chest pain d/t coughing, chills, body aches- started tues, seen at [facility] yesterday

## 2013-05-06 ENCOUNTER — Emergency Department (HOSPITAL_COMMUNITY)
Admission: EM | Admit: 2013-05-06 | Discharge: 2013-05-06 | Disposition: A | Discharge status: Home / Self Care | Payer: BC Managed Care – PPO | Attending: Emergency Medicine | Admitting: Emergency Medicine

## 2013-05-06 ENCOUNTER — Emergency Department (HOSPITAL_COMMUNITY): Payer: BC Managed Care – PPO

## 2013-05-06 DIAGNOSIS — R6889 Other general symptoms and signs: Secondary | ICD-10-CM

## 2013-05-06 DIAGNOSIS — J208 Acute bronchitis due to other specified organisms: Secondary | ICD-10-CM

## 2013-05-06 MED ORDER — BENZONATATE 100 MG PO CAPS: 100.0000 mg | ORAL_CAPSULE | Freq: Three times a day (TID) | ORAL | Status: DC

## 2013-05-06 MED ORDER — DM-GUAIFENESIN ER 30-600 MG PO TB12: 1.0000 | ORAL_TABLET | Freq: Two times a day (BID) | ORAL | Status: DC

## 2013-05-06 MED ORDER — ACETAMINOPHEN 325 MG PO TABS
650.0000 mg | ORAL_TABLET | Freq: Once | ORAL | Status: AC
  Administered 2013-05-06: 650 mg via ORAL
  Filled 2013-05-06: qty 2

## 2013-05-06 MED ORDER — DM-GUAIFENESIN ER 30-600 MG PO TB12
1.0000 | ORAL_TABLET | Freq: Once | ORAL | Status: AC
  Administered 2013-05-06: 1 via ORAL
  Filled 2013-05-06: qty 1

## 2013-05-06 NOTE — ED Provider Notes (Signed)
CSN: [id number]     Arrival date & time 05/05/13  2109 History   First MD Initiated Contact with Patient 05/06/13 0246     Chief Complaint  Patient presents with  . Cough   (Consider location/radiation/quality/duration/timing/severity/associated sxs/prior Treatment) HPI 48 year old female presents to emergency room with complaint of persistent cough, body aches, and fever.  Patient feels that she needs a breathing treatment for the congestion in her chest.  Patient was seen for similar symptoms yesterday.  She was prescribed Sudafed, which she has not yet started taking.  Patient is a pack-a-day smoker.  She has taken aspirin and Primatene tablets without improvement in symptoms.  No flu shot this year.  No known sick contacts. Past Medical History  Diagnosis Date  . Bronchitis   . Heart murmur     "closed by age 89."   Past Surgical History  Procedure Laterality Date  . Abdominal hysterectomy    . Rotator cuff repair      Right  . Cystectomy      L wrist  . Tympanostomy tube placement    . Anterior cervical decomp/discectomy fusion  03/19/2012    Procedure: ANTERIOR CERVICAL DECOMPRESSION/DISCECTOMY FUSION 1 LEVEL;  Surgeon: Melina Schools, MD;  Location: [place];  Service: Orthopedics;  Laterality: Left;  Total Disc Replacement C4-5  . Cervical fusion  05/07/2012    Dr Rolena Infante  . Anterior cervical decomp/discectomy fusion N/A 05/07/2012    Procedure: ANTERIOR CERVICAL DECOMPRESSION/DISCECTOMY FUSION 1 LEVEL/HARDWARE REMOVAL;  Surgeon: Melina Schools, MD;  Location: [place];  Service: Orthopedics;  Laterality: N/A;  REMOVAL OF CERVICAL DISC REPLACEMENT AND ACDF C4-5   Family History  Problem Relation Age of Onset  . Diabetes Mother   . Hypertension Father    History  Substance Use Topics  . Smoking status: Current Every Day Smoker -- 0.50 packs/day for 20 years    Types: Cigarettes  . Smokeless tobacco: Never Used  . Alcohol Use: Yes     Comment: occasional   OB History   Grav  Para Term Preterm Abortions TAB SAB Ect Mult Living                 Review of Systems  See History of Present Illness; otherwise all other systems are reviewed and negative Allergies  Meloxicam  Home Medications   Current Outpatient Rx  Name  Route  Sig  Dispense  Refill  . aspirin 325 MG tablet   Oral   Take 1,300 mg by mouth every 6 (six) hours as needed for moderate pain.         . benzonatate (TESSALON) 100 MG capsule   Oral   Take 1 capsule (100 mg total) by mouth every 8 (eight) hours.   21 capsule   0   . dextromethorphan-guaiFENesin (MUCINEX DM) 30-600 MG per 12 hr tablet   Oral   Take 1 tablet by mouth 2 (two) times daily.   30 tablet   0   . ibuprofen (ADVIL,MOTRIN) 200 MG tablet   Oral   Take 400 mg by mouth every 6 (six) hours as needed for pain.         . pseudoephedrine (SUDAFED 12 HOUR) 120 MG 12 hr tablet   Oral   Take 1 tablet (120 mg total) by mouth 2 (two) times daily.   18 tablet   0    BP 150/94  Pulse 113  Temp(Src) 98.7 F (37.1 C) (Oral)  Resp 30  SpO2 99% Physical  Exam  Nursing note and vitals reviewed. Constitutional: She is oriented to person, place, and time. She appears well-developed and well-nourished.  HENT:  Head: Normocephalic and atraumatic.  Right Ear: External ear normal.  Left Ear: External ear normal.  Nose: Nose normal.  Mouth/Throat: Oropharynx is clear and moist.  Eyes: Conjunctivae and EOM are normal. Pupils are equal, round, and reactive to light.  Neck: Normal range of motion. Neck supple. No JVD present. No tracheal deviation present. No thyromegaly present.  Cardiovascular: Normal rate, regular rhythm, normal heart sounds and intact distal pulses.  Exam reveals no gallop and no friction rub.   No murmur heard. Pulmonary/Chest: Effort normal and breath sounds normal. No stridor. No respiratory distress. She has no wheezes. She has no rales. She exhibits tenderness (diffuse mild tenderness).  Cough   Abdominal: Soft. Bowel sounds are normal. She exhibits no distension and no mass. There is no tenderness. There is no rebound and no guarding.  Musculoskeletal: Normal range of motion. She exhibits no edema and no tenderness.  Lymphadenopathy:    She has no cervical adenopathy.  Neurological: She is alert and oriented to person, place, and time. She exhibits normal muscle tone. Coordination normal.  Skin: Skin is warm and dry. No rash noted. No erythema. No pallor.  Psychiatric: She has a normal mood and affect. Her behavior is normal. Judgment and thought content normal.    ED Course  Procedures (including critical care time) Labs Review Labs Reviewed - No data to display Imaging Review Dg Chest 2 View  05/06/2013   CLINICAL DATA:  Shortness of breath, cough  EXAM: CHEST  2 VIEW  COMPARISON:  None available  FINDINGS: The cardiac and mediastinal silhouettes are stable in size and contour, and remain within normal limits.  The lungs are normally inflated. Mild diffuse bronchitic changes are present. No airspace consolidation, pleural effusion, or pulmonary edema is identified. There is no pneumothorax.  No acute osseous abnormality identified.  IMPRESSION: Mild diffuse bronchitic changes.  No focal infiltrates identified.   Electronically Signed   By: Jeannine Boga M.D.   On: 05/06/2013 03:10    EKG Interpretation    Date/Time:  Wednesday May 05 2013 21:13:59 EST Ventricular Rate:  106 PR Interval:  124 QRS Duration: 70 QT Interval:  328 QTC Calculation: 435 R Axis:   46 Text Interpretation:  Sinus tachycardia Nonspecific T wave abnormality Abnormal ECG Confirmed by Jaxsun Ciampi  MD, Duquan Gillooly ([id number]) on 05/06/2013 2:54:05 AM            MDM   1. Viral bronchitis   2. Flu-like symptoms    48 year old female without pneumonia noted on chest x-ray.  Probable viral bronchitis.  Plan to treat with Mucinex DM and Tessalon Perles.  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  Patient is instructed not to go to work until she has been fever free for 24 hours    Kalman Drape, MD 05/06/13 [phone]

## 2013-05-06 NOTE — ED Notes (Signed)
Pt came up to RN first; pt states she fell asleep

## 2013-05-06 NOTE — ED Notes (Signed)
Called pt for room, no answer

## 2013-05-06 NOTE — Discharge Instructions (Signed)
Expect to have cough for up to 8 weeks.  Take medications as prescribed.  Alternate tylenol and ibuprofen every 4-6 hours for body aches and fever.  Rest.  NO WORK UNTIL YOU HAVE BEEN FEVER FREE FOR 24 HOURS!   Acute Bronchitis Bronchitis is inflammation of the airways that extend from the windpipe into the lungs (bronchi). The inflammation often causes mucus to develop. This leads to a cough, which is the most common symptom of bronchitis.  In acute bronchitis, the condition usually develops suddenly and goes away over time, usually in a couple weeks. Smoking, allergies, and asthma can make bronchitis worse. Repeated episodes of bronchitis may cause further lung problems.  CAUSES Acute bronchitis is most often caused by the same virus that causes a cold. The virus can spread from person to person (contagious).  SIGNS AND SYMPTOMS   Cough.   Fever.   Coughing up mucus.   Body aches.   Chest congestion.   Chills.   Shortness of breath.   Sore throat.  DIAGNOSIS  Acute bronchitis is usually diagnosed through a physical exam. Tests, such as chest X-rays, are sometimes done to rule out other conditions.  TREATMENT  Acute bronchitis usually goes away in a couple weeks. Often times, no medical treatment is necessary. Medicines are sometimes given for relief of fever or cough. Antibiotics are usually not needed but may be prescribed in certain situations. In some cases, an inhaler may be recommended to help reduce shortness of breath and control the cough. A cool mist vaporizer may also be used to help thin bronchial secretions and make it easier to clear the chest.  HOME CARE INSTRUCTIONS  Get plenty of rest.   Drink enough fluids to keep your urine clear or pale yellow (unless you have a medical condition that requires fluid restriction). Increasing fluids may help thin your secretions and will prevent dehydration.   Only take over-the-counter or prescription medicines as  directed by your health care provider.   Avoid smoking and secondhand smoke. Exposure to cigarette smoke or irritating chemicals will make bronchitis worse. If you are a smoker, consider using nicotine gum or skin patches to help control withdrawal symptoms. Quitting smoking will help your lungs heal faster.   Reduce the chances of another bout of acute bronchitis by washing your hands frequently, avoiding people with cold symptoms, and trying not to touch your hands to your mouth, nose, or eyes.   Follow up with your health care provider as directed.  SEEK MEDICAL CARE IF: Your symptoms do not improve after 1 week of treatment.  SEEK IMMEDIATE MEDICAL CARE IF:  You develop an increased fever or chills.   You have chest pain.   You have severe shortness of breath.  You have bloody sputum.   You develop dehydration.  You develop fainting.  You develop repeated vomiting.  You develop a severe headache. MAKE SURE YOU:   Understand these instructions.  Will watch your condition.  Will get help right away if you are not doing well or get worse. Document Released: 04/25/2004 Document Revised: 11/18/2012 Document Reviewed: 09/08/2012 [facility] Patient Information 2014 [place].  Cool Mist Vaporizers Vaporizers may help relieve the symptoms of a cough and cold. They add moisture to the air, which helps mucus to become thinner and less sticky. This makes it easier to breathe and cough up secretions. Cool mist vaporizers do not cause serious burns like hot mist vaporizers ("steamers, humidifiers"). Vaporizers have not been proved to show  they help with colds. You should not use a vaporizer if you are allergic to mold.  HOME CARE INSTRUCTIONS  Follow the package instructions for the vaporizer.  Do not use anything other than distilled water in the vaporizer.  Do not run the vaporizer all of the time. This can cause mold or bacteria to grow in the vaporizer.  Clean the  vaporizer after each time it is used.  Clean and dry the vaporizer well before storing it.  Stop using the vaporizer if worsening respiratory symptoms develop. Document Released: 12/14/2003 Document Revised: 11/18/2012 Document Reviewed: 08/05/2012 [facility] Patient Information 2014 [place], [place].  Influenza, Adult Influenza (flu) is an infection in the mouth, nose, and throat (respiratory tract) caused by a virus. The flu can make you feel very ill. Influenza spreads easily from person to person (contagious).  HOME CARE   Only take medicines as told by your doctor.  Use a cool mist humidifier to make breathing easier.  Get plenty of rest until your fever goes away. This usually takes 3 to 4 days.  Drink enough fluids to keep your pee (urine) clear or pale yellow.  Cover your mouth and nose when you cough or sneeze.  Wash your hands well to avoid spreading the flu.  Stay home from work or school until your fever has been gone for at least 1 full day.  Get a flu shot every year. GET HELP RIGHT AWAY IF:   You have trouble breathing or feel short of breath.  Your skin or nails turn blue.  You have severe neck pain or stiffness.  You have a severe headache, facial pain, or earache.  Your fever gets worse or keeps coming back.  You feel sick to your stomach (nauseous), throw up (vomit), or have watery poop (diarrhea).  You have chest pain.  You have a deep cough that gets worse, or you cough up more thick spit (mucus). MAKE SURE YOU:   Understand these instructions.  Will watch your condition.  Will get help right away if you are not doing well or get worse. Document Released: 12/26/2007 Document Revised: 09/17/2011 Document Reviewed: 06/17/2011 [facility] Patient Information 2014 [place], [place].  Viral Infections A viral infection can be caused by different types of viruses.Most viral infections are not serious and resolve on their own. However, some infections may  cause severe symptoms and may lead to further complications. SYMPTOMS Viruses can frequently cause:  Minor sore throat.  Aches and pains.  Headaches.  Runny nose.  Different types of rashes.  Watery eyes.  Tiredness.  Cough.  Loss of appetite.  Gastrointestinal infections, resulting in nausea, vomiting, and diarrhea. These symptoms do not respond to antibiotics because the infection is not caused by bacteria. However, you might catch a bacterial infection following the viral infection. This is sometimes called a "superinfection." Symptoms of such a bacterial infection may include:  Worsening sore throat with pus and difficulty swallowing.  Swollen neck glands.  Chills and a high or persistent fever.  Severe headache.  Tenderness over the sinuses.  Persistent overall ill feeling (malaise), muscle aches, and tiredness (fatigue).  Persistent cough.  Yellow, green, or brown mucus production with coughing. HOME CARE INSTRUCTIONS   Only take over-the-counter or prescription medicines for pain, discomfort, diarrhea, or fever as directed by your caregiver.  Drink enough water and fluids to keep your urine clear or pale yellow. Sports drinks can provide valuable electrolytes, sugars, and hydration.  Get plenty of rest and maintain proper  nutrition. Soups and broths with crackers or rice are fine. SEEK IMMEDIATE MEDICAL CARE IF:   You have severe headaches, shortness of breath, chest pain, neck pain, or an unusual rash.  You have uncontrolled vomiting, diarrhea, or you are unable to keep down fluids.  You or your child has an oral temperature above 102 F (38.9 C), not controlled by medicine.  Your baby is older than 3 months with a rectal temperature of 102 F (38.9 C) or higher.  Your baby is 41 months old or younger with a rectal temperature of 100.4 F (38 C) or higher. MAKE SURE YOU:   Understand these instructions.  Will watch your condition.  Will get  help right away if you are not doing well or get worse. Document Released: 12/26/2004 Document Revised: 06/10/2011 Document Reviewed: 07/23/2010 [facility] Patient Information 2014 [place], [place].

## 2013-07-08 ENCOUNTER — Encounter (HOSPITAL_COMMUNITY): Payer: Self-pay | Admitting: Emergency Medicine

## 2013-07-08 ENCOUNTER — Emergency Department (INDEPENDENT_AMBULATORY_CARE_PROVIDER_SITE_OTHER)
Admission: EM | Admit: 2013-07-08 | Discharge: 2013-07-08 | Disposition: A | Discharge status: Home / Self Care | Payer: BC Managed Care – PPO | Source: Home / Self Care | Attending: Family Medicine | Admitting: Family Medicine

## 2013-07-08 DIAGNOSIS — J00 Acute nasopharyngitis [common cold]: Secondary | ICD-10-CM

## 2013-07-08 LAB — POCT RAPID STREP A: Streptococcus, Group A Screen (Direct): NEGATIVE

## 2013-07-08 NOTE — Discharge Instructions (Signed)
Your strep test was negative. You have a common cold. Plenty of fluids. Tylenol as directed on packaging for pain. Delsym as directed on packaging for cough. Your symptoms will improve over the next several days.   Upper Respiratory Infection, Adult An upper respiratory infection (URI) is also sometimes known as the common cold. The upper respiratory tract includes the nose, sinuses, throat, trachea, and bronchi. Bronchi are the airways leading to the lungs. Most people improve within 1 week, but symptoms can last up to 2 weeks. A residual cough may last even longer.  CAUSES Many different viruses can infect the tissues lining the upper respiratory tract. The tissues become irritated and inflamed and often become very moist. Mucus production is also common. A cold is contagious. You can easily spread the virus to others by oral contact. This includes kissing, sharing a glass, coughing, or sneezing. Touching your mouth or nose and then touching a surface, which is then touched by another person, can also spread the virus. SYMPTOMS  Symptoms typically develop 1 to 3 days after you come in contact with a cold virus. Symptoms vary from person to person. They may include:  Runny nose.  Sneezing.  Nasal congestion.  Sinus irritation.  Sore throat.  Loss of voice (laryngitis).  Cough.  Fatigue.  Muscle aches.  Loss of appetite.  Headache.  Low-grade fever. DIAGNOSIS  You might diagnose your own cold based on familiar symptoms, since most people get a cold 2 to 3 times a year. Your caregiver can confirm this based on your exam. Most importantly, your caregiver can check that your symptoms are not due to another disease such as strep throat, sinusitis, pneumonia, asthma, or epiglottitis. Blood tests, throat tests, and X-rays are not necessary to diagnose a common cold, but they may sometimes be helpful in excluding other more serious diseases. Your caregiver will decide if any further tests  are required. RISKS AND COMPLICATIONS  You may be at risk for a more severe case of the common cold if you smoke cigarettes, have chronic heart disease (such as heart failure) or lung disease (such as asthma), or if you have a weakened immune system. The very young and very old are also at risk for more serious infections. Bacterial sinusitis, middle ear infections, and bacterial pneumonia can complicate the common cold. The common cold can worsen asthma and chronic obstructive pulmonary disease (COPD). Sometimes, these complications can require emergency medical care and may be life-threatening. PREVENTION  The best way to protect against getting a cold is to practice good hygiene. Avoid oral or hand contact with people with cold symptoms. Wash your hands often if contact occurs. There is no clear evidence that vitamin C, vitamin E, echinacea, or exercise reduces the chance of developing a cold. However, it is always recommended to get plenty of rest and practice good nutrition. TREATMENT  Treatment is directed at relieving symptoms. There is no cure. Antibiotics are not effective, because the infection is caused by a virus, not by bacteria. Treatment may include:  Increased fluid intake. Sports drinks offer valuable electrolytes, sugars, and fluids.  Breathing heated mist or steam (vaporizer or shower).  Eating chicken soup or other clear broths, and maintaining good nutrition.  Getting plenty of rest.  Using gargles or lozenges for comfort.  Controlling fevers with ibuprofen or acetaminophen as directed by your caregiver.  Increasing usage of your inhaler if you have asthma. Zinc gel and zinc lozenges, taken in the first 24 hours of the  common cold, can shorten the duration and lessen the severity of symptoms. Pain medicines may help with fever, muscle aches, and throat pain. A variety of non-prescription medicines are available to treat congestion and runny nose. Your caregiver can make  recommendations and may suggest nasal or lung inhalers for other symptoms.  HOME CARE INSTRUCTIONS   Only take over-the-counter or prescription medicines for pain, discomfort, or fever as directed by your caregiver.  Use a warm mist humidifier or inhale steam from a shower to increase air moisture. This may keep secretions moist and make it easier to breathe.  Drink enough water and fluids to keep your urine clear or pale yellow.  Rest as needed.  Return to work when your temperature has returned to normal or as your caregiver advises. You may need to stay home longer to avoid infecting others. You can also use a face mask and careful hand washing to prevent spread of the virus. SEEK MEDICAL CARE IF:   After the first few days, you feel you are getting worse rather than better.  You need your caregiver's advice about medicines to control symptoms.  You develop chills, worsening shortness of breath, or brown or red sputum. These may be signs of pneumonia.  You develop yellow or brown nasal discharge or pain in the face, especially when you bend forward. These may be signs of sinusitis.  You develop a fever, swollen neck glands, pain with swallowing, or white areas in the back of your throat. These may be signs of strep throat. SEEK IMMEDIATE MEDICAL CARE IF:   You have a fever.  You develop severe or persistent headache, ear pain, sinus pain, or chest pain.  You develop wheezing, a prolonged cough, cough up blood, or have a change in your usual mucus (if you have chronic lung disease).  You develop sore muscles or a stiff neck. Document Released: 09/11/2000 Document Revised: 06/10/2011 Document Reviewed: 07/20/2010 [facility] Patient Information 2014 [place], [place].

## 2013-07-08 NOTE — ED Notes (Signed)
C/o sore throat, congested cough, aching in her legs, sinus pressure with congestion.  No chills or fever.

## 2013-07-08 NOTE — ED Provider Notes (Signed)
CSN: [id number]     Arrival date & time 07/08/13  2000 History   First MD Initiated Contact with Patient 07/08/13 2038     Chief Complaint  Patient presents with  . Sore Throat   (Consider location/radiation/quality/duration/timing/severity/associated sxs/prior Treatment) Patient is a 48 y.o. female presenting with pharyngitis and URI.  Sore Throat Pertinent negatives include no headaches and no shortness of breath.  URI Presenting symptoms: congestion, cough, ear pain, rhinorrhea and sore throat   Presenting symptoms: no fever   Severity:  Mild Onset quality:  Gradual Duration:  1 day Timing:  Constant Progression:  Unchanged Chronicity:  New Associated symptoms: no arthralgias, no headaches, no myalgias, no neck pain, no sinus pain, no sneezing, no swollen glands and no wheezing     Past Medical History  Diagnosis Date  . Bronchitis   . Heart murmur     "closed by age 28."   Past Surgical History  Procedure Laterality Date  . Rotator cuff repair      Right  . Cystectomy      L wrist  . Tympanostomy tube placement    . 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 Cervical fusion  05/07/2012    Dr Rolena Infante  . Anterior cervical decomp/discectomy fusion N/A 05/07/2012    Procedure: ANTERIOR CERVICAL DECOMPRESSION/DISCECTOMY FUSION 1 LEVEL/HARDWARE REMOVAL;  Surgeon: Melina Schools, MD;  Location: [facility];  Service: Orthopedics;  Laterality: N/A;  REMOVAL OF CERVICAL DISC REPLACEMENT AND ACDF C4-5  . Abdominal hysterectomy  2000   Family History  Problem Relation Age of Onset  . Diabetes Mother   . Hypertension Father    History  Substance Use Topics  . Smoking status: Current Every Day Smoker -- 0.50 packs/day for 20 years    Types: Cigarettes  . Smokeless tobacco: Never  Used  . Alcohol Use: Yes     Comment: occasional   OB History   Grav Para Term Preterm Abortions TAB SAB Ect Mult Living                 Review of Systems  Constitutional: Negative for fever.  HENT: Positive for congestion, ear pain, rhinorrhea and sore throat. Negative for sneezing.   Eyes: Negative.   Respiratory: Positive for cough. Negative for chest tightness, shortness of breath and wheezing.   Cardiovascular: Negative.   Gastrointestinal: Negative.   Genitourinary: Negative.   Musculoskeletal: Negative for arthralgias, myalgias and neck pain.  Neurological: Negative for headaches.    Allergies  Meloxicam  Home Medications   Current Outpatient Rx  Name  Route  Sig  Dispense  Refill  . aspirin 325 MG tablet   Oral   Take 1,300 mg by mouth every 6 (six) hours as needed for moderate pain.         Marland Kitchen dextromethorphan-guaiFENesin (MUCINEX DM) 30-600 MG per 12 hr tablet   Oral   Take 1 tablet by mouth 2 (two) times daily.   30 tablet   0   . loratadine-pseudoephedrine (CLARITIN-D 24-HOUR) 10-240 MG per 24 hr tablet   Oral   Take 1 tablet by mouth daily.         . Multiple Vitamins-Minerals (MULTIVITAMIN WITH MINERALS) tablet   Oral   Take 1 tablet by mouth daily.         Marland Kitchen  benzonatate (TESSALON) 100 MG capsule   Oral   Take 1 capsule (100 mg total) by mouth every 8 (eight) hours.   21 capsule   0   . ibuprofen (ADVIL,MOTRIN) 200 MG tablet   Oral   Take 400 mg by mouth every 6 (six) hours as needed for pain.         . pseudoephedrine (SUDAFED 12 HOUR) 120 MG 12 hr tablet   Oral   Take 1 tablet (120 mg total) by mouth 2 (two) times daily.   18 tablet   0    BP 128/92  Pulse 95  Temp(Src) 97.6 F (36.4 C) (Oral)  Resp 18  SpO2 100% Physical Exam  Nursing note and vitals reviewed. Constitutional: She is oriented to person, place, and time. She appears well-developed and well-nourished. No distress.  HENT:  Head: Normocephalic and atraumatic.   Right Ear: Hearing, tympanic membrane, external ear and ear canal normal.  Left Ear: Hearing, tympanic membrane, external ear and ear canal normal.  Nose: Mucosal edema and rhinorrhea present.  Mouth/Throat: Uvula is midline, oropharynx is clear and moist and mucous membranes are normal.  Trace of clear fluid behind each TM  Eyes: Conjunctivae are normal. Right eye exhibits no discharge. Left eye exhibits no discharge. No scleral icterus.  Neck: Normal range of motion. Neck supple.  Cardiovascular: Normal rate, regular rhythm and normal heart sounds.   Pulmonary/Chest: Effort normal and breath sounds normal. No respiratory distress. She has no wheezes.  Abdominal: There is no tenderness.  Musculoskeletal: Normal range of motion.  Lymphadenopathy:    She has no cervical adenopathy.  Neurological: She is alert and oriented to person, place, and time.  Skin: Skin is warm and dry. No rash noted.  Psychiatric: She has a normal mood and affect. Her behavior is normal.    ED Course  Procedures (including critical care time) Labs Review Labs Reviewed  POCT RAPID STREP A ([place])   Imaging Review No results found.   MDM   1. Common cold    Rapid strep negative. Exam consistent with common cold. Will advise regarding symptomatic care at home.     [place], [place] 07/08/13 2122

## 2013-07-08 NOTE — ED Notes (Signed)
Pt. started huffing when I gave her the work note saying she could go back to work tomorrow and the diagnosis of common cold. Pt. angry when she left.  States " this is not a common cold.. I'm coughing up red stuff."  Pt. started to walk out. I asked her to sign that she had received the instructions. Pt. signed.

## 2013-07-08 NOTE — ED Provider Notes (Signed)
Medical screening examination/treatment/procedure(s) were performed by resident physician or non-physician practitioner and as supervising physician I was immediately available for consultation/collaboration.   Pauline Good MD.   Billy Fischer, MD 07/08/13 2149

## 2013-07-10 LAB — CULTURE, GROUP A STREP

## 2013-08-01 ENCOUNTER — Encounter (HOSPITAL_COMMUNITY): Payer: Self-pay | Admitting: Emergency Medicine

## 2013-08-01 ENCOUNTER — Emergency Department (HOSPITAL_COMMUNITY)
Admission: EM | Admit: 2013-08-01 | Discharge: 2013-08-01 | Disposition: A | Discharge status: Home / Self Care | Payer: BC Managed Care – PPO | Attending: Emergency Medicine | Admitting: Emergency Medicine

## 2013-08-01 DIAGNOSIS — R011 Cardiac murmur, unspecified: Secondary | ICD-10-CM | POA: Insufficient documentation

## 2013-08-01 DIAGNOSIS — Z79899 Other long term (current) drug therapy: Secondary | ICD-10-CM | POA: Insufficient documentation

## 2013-08-01 DIAGNOSIS — J40 Bronchitis, not specified as acute or chronic: Secondary | ICD-10-CM

## 2013-08-01 DIAGNOSIS — IMO0002 Reserved for concepts with insufficient information to code with codable children: Secondary | ICD-10-CM | POA: Insufficient documentation

## 2013-08-01 DIAGNOSIS — J029 Acute pharyngitis, unspecified: Secondary | ICD-10-CM | POA: Insufficient documentation

## 2013-08-01 DIAGNOSIS — F17200 Nicotine dependence, unspecified, uncomplicated: Secondary | ICD-10-CM | POA: Insufficient documentation

## 2013-08-01 DIAGNOSIS — H9209 Otalgia, unspecified ear: Secondary | ICD-10-CM | POA: Insufficient documentation

## 2013-08-01 DIAGNOSIS — Z7982 Long term (current) use of aspirin: Secondary | ICD-10-CM | POA: Insufficient documentation

## 2013-08-01 DIAGNOSIS — Z792 Long term (current) use of antibiotics: Secondary | ICD-10-CM | POA: Insufficient documentation

## 2013-08-01 MED ORDER — PREDNISONE 20 MG PO TABS: 40.0000 mg | ORAL_TABLET | Freq: Every day | ORAL | Status: DC

## 2013-08-01 MED ORDER — HYDROCOD POLST-CHLORPHEN POLST 10-8 MG/5ML PO LQCR: 5.0000 mL | Freq: Two times a day (BID) | ORAL | Status: DC | PRN

## 2013-08-01 MED ORDER — AZITHROMYCIN 250 MG PO TABS: 250.0000 mg | ORAL_TABLET | Freq: Every day | ORAL | Status: DC

## 2013-08-01 MED ORDER — ALBUTEROL SULFATE HFA 108 (90 BASE) MCG/ACT IN AERS
1.0000 | INHALATION_SPRAY | Freq: Four times a day (QID) | RESPIRATORY_TRACT | Status: DC | PRN
  Administered 2013-08-01: 2 via RESPIRATORY_TRACT
  Filled 2013-08-01: qty 6.7

## 2013-08-01 MED ORDER — PREDNISONE 20 MG PO TABS
60.0000 mg | ORAL_TABLET | Freq: Once | ORAL | Status: AC
  Administered 2013-08-01: 60 mg via ORAL
  Filled 2013-08-01: qty 3

## 2013-08-01 NOTE — ED Notes (Signed)
Cold cough for one week with chest congestion productive cough.  No temp that she is aware of

## 2013-08-01 NOTE — ED Notes (Signed)
Pt discharged to home with family. NAD.  

## 2013-08-01 NOTE — ED Provider Notes (Signed)
CSN: [id number]     Arrival date & time 08/01/13  0631 History   First MD Initiated Contact with Patient 08/01/13 0715     Chief Complaint  Patient presents with  . Cough     (Consider location/radiation/quality/duration/timing/severity/associated sxs/prior Treatment) Patient is a 48 y.o. female presenting with cough. The history is provided by the patient.  Cough Cough characteristics:  Productive Sputum characteristics:  Yellow Severity:  Severe Onset quality:  Gradual Duration:  1 week Timing:  Constant Chronicity:  Recurrent Context: exposure to allergens, smoke exposure and weather changes   Worsened by:  Smoking and lying down Ineffective treatments:  Decongestant (antihistamine) Associated symptoms: ear pain and sore throat   Associated symptoms: no chest pain, no fever, no myalgias and no shortness of breath     Past Medical History  Diagnosis Date  . Bronchitis   . Heart murmur     "closed by age 64."   Past Surgical History  Procedure Laterality Date  . Rotator cuff repair      Right  . Cystectomy      L wrist  . Tympanostomy tube placement    . Anterior cervical decomp/discectomy fusion  03/19/2012    Procedure: ANTERIOR CERVICAL DECOMPRESSION/DISCECTOMY FUSION 1 LEVEL;  Surgeon: Melina Schools, MD;  Location: [place];  Service: Orthopedics;  Laterality: Left;  Total Disc Replacement C4-5  . Cervical fusion  05/07/2012    Dr Rolena Infante  . Anterior cervical decomp/discectomy fusion N/A 05/07/2012    Procedure: ANTERIOR CERVICAL DECOMPRESSION/DISCECTOMY FUSION 1 LEVEL/HARDWARE REMOVAL;  Surgeon: Melina Schools, MD;  Location: [place];  Service: Orthopedics;  Laterality: N/A;  REMOVAL OF CERVICAL DISC REPLACEMENT AND ACDF C4-5  . Abdominal hysterectomy  2000   Family History  Problem Relation Age of Onset  . Diabetes Mother   . Hypertension Father    History  Substance Use Topics  . Smoking status: Current Every Day Smoker -- 0.50 packs/day for 20 years    Types:  Cigarettes  . Smokeless tobacco: Never Used  . Alcohol Use: Yes     Comment: occasional   OB History   Grav Para Term Preterm Abortions TAB SAB Ect Mult Living                 Review of Systems  Constitutional: Negative for fever and appetite change.  HENT: Positive for ear pain and sore throat. Negative for congestion.   Respiratory: Positive for cough. Negative for shortness of breath.   Cardiovascular: Negative for chest pain.  Gastrointestinal: Negative for nausea, vomiting and abdominal pain.  Musculoskeletal: Negative for myalgias.      Allergies  Meloxicam  Home Medications   Prior to Admission medications   Medication Sig Start Date End Date Taking? Authorizing Provider  aspirin 325 MG tablet Take 1,300 mg by mouth every 6 (six) hours as needed for moderate pain.   Yes Historical Provider, MD  dextromethorphan-guaiFENesin (MUCINEX DM) 30-600 MG per 12 hr tablet Take 1 tablet by mouth 2 (two) times daily as needed for cough.   Yes Historical Provider, MD  ibuprofen (ADVIL,MOTRIN) 200 MG tablet Take 400 mg by mouth every 6 (six) hours as needed for pain.   Yes Historical Provider, MD  loratadine-pseudoephedrine (CLARITIN-D 24-HOUR) 10-240 MG per 24 hr tablet Take 1 tablet by mouth daily.   Yes Historical Provider, MD  Multiple Vitamins-Minerals (MULTIVITAMIN WITH MINERALS) tablet Take 1 tablet by mouth daily.   Yes Historical Provider, MD  pseudoephedrine (SUDAFED) 120 MG 12  hr tablet Take 120 mg by mouth every 12 (twelve) hours as needed for congestion.   Yes Historical Provider, MD  Pseudoephedrine-DM-GG (ROBITUSSIN COLD & COUGH PO) Take 15 mLs by mouth every 4 (four) hours as needed (cough).   Yes Historical Provider, MD  azithromycin (ZITHROMAX) 250 MG tablet Take 1 tablet (250 mg total) by mouth daily. 08/01/13   Saddie Benders. Ebrahim Deremer, MD  chlorpheniramine-HYDROcodone (TUSSIONEX PENNKINETIC ER) 10-8 MG/5ML LQCR Take 5 mLs by mouth every 12 (twelve) hours as needed for cough.  08/01/13   Saddie Benders. Consuelo Thayne, MD  predniSONE (DELTASONE) 20 MG tablet Take 2 tablets (40 mg total) by mouth daily. 08/01/13   Saddie Benders. Eulon Allnutt, MD   BP 148/90  Pulse 95  Temp(Src) 98.8 F (37.1 C)  Resp 20  Ht 5\' 6"  (1.676 m)  Wt 201 lb (91.173 kg)  BMI 32.46 kg/m2  SpO2 95% Physical Exam  Nursing note and vitals reviewed. Constitutional: She is oriented to person, place, and time. She appears well-developed and well-nourished. No distress.  HENT:  Head: Normocephalic and atraumatic.  Eyes: Conjunctivae and EOM are normal.  Neck: Neck supple.  Cardiovascular: Normal rate, regular rhythm and intact distal pulses.   Pulmonary/Chest: Effort normal. No respiratory distress. She has no wheezes.  Cough during exam, dry  Abdominal: Soft. She exhibits no distension. There is no tenderness. There is no rebound.  Neurological: She is alert and oriented to person, place, and time.  Skin: Skin is warm and dry. No rash noted. She is not diaphoretic.    ED Course  Procedures (including critical care time) Labs Review Labs Reviewed - No data to display  Imaging Review No results found.   EKG Interpretation None     RA sat is 95% and I interpret to be adequate MDM   Final diagnoses:  Bronchitis    Pt with some signs and symptoms for bronchitis with productive cough without fever.  Symptoms going into 1 week, getting 2worse, will give Rx for steroids, cough syrup, Z pak.  Pt recommended to stop smoking and to obtain a PCP for follow up and ongoing care.  She is not in resp distress nor toxic appearing.        Saddie Benders. Dorna Mai, MD 08/01/13 [phone]

## 2013-08-01 NOTE — Discharge Instructions (Signed)

## 2013-08-03 ENCOUNTER — Emergency Department (HOSPITAL_COMMUNITY): Payer: BC Managed Care – PPO

## 2013-08-03 ENCOUNTER — Encounter (HOSPITAL_COMMUNITY): Payer: Self-pay | Admitting: Emergency Medicine

## 2013-08-03 ENCOUNTER — Emergency Department (HOSPITAL_COMMUNITY)
Admission: EM | Admit: 2013-08-03 | Discharge: 2013-08-03 | Disposition: A | Discharge status: Home / Self Care | Payer: BC Managed Care – PPO | Attending: Emergency Medicine | Admitting: Emergency Medicine

## 2013-08-03 DIAGNOSIS — R062 Wheezing: Secondary | ICD-10-CM | POA: Insufficient documentation

## 2013-08-03 DIAGNOSIS — R05 Cough: Secondary | ICD-10-CM | POA: Insufficient documentation

## 2013-08-03 DIAGNOSIS — F17200 Nicotine dependence, unspecified, uncomplicated: Secondary | ICD-10-CM | POA: Insufficient documentation

## 2013-08-03 DIAGNOSIS — M25579 Pain in unspecified ankle and joints of unspecified foot: Secondary | ICD-10-CM | POA: Insufficient documentation

## 2013-08-03 DIAGNOSIS — R011 Cardiac murmur, unspecified: Secondary | ICD-10-CM | POA: Insufficient documentation

## 2013-08-03 DIAGNOSIS — R059 Cough, unspecified: Secondary | ICD-10-CM | POA: Insufficient documentation

## 2013-08-03 DIAGNOSIS — M79673 Pain in unspecified foot: Secondary | ICD-10-CM

## 2013-08-03 DIAGNOSIS — J40 Bronchitis, not specified as acute or chronic: Secondary | ICD-10-CM | POA: Insufficient documentation

## 2013-08-03 DIAGNOSIS — Z888 Allergy status to other drugs, medicaments and biological substances status: Secondary | ICD-10-CM | POA: Insufficient documentation

## 2013-08-03 DIAGNOSIS — R269 Unspecified abnormalities of gait and mobility: Secondary | ICD-10-CM | POA: Insufficient documentation

## 2013-08-03 MED ORDER — ALBUTEROL SULFATE (2.5 MG/3ML) 0.083% IN NEBU
5.0000 mg | INHALATION_SOLUTION | Freq: Once | RESPIRATORY_TRACT | Status: AC
  Administered 2013-08-03: 5 mg via RESPIRATORY_TRACT
  Filled 2013-08-03: qty 6

## 2013-08-03 MED ORDER — TRAMADOL HCL 50 MG PO TABS: 50.0000 mg | ORAL_TABLET | Freq: Four times a day (QID) | ORAL | Status: DC | PRN

## 2013-08-03 MED ORDER — IBUPROFEN 600 MG PO TABS: 600.0000 mg | ORAL_TABLET | Freq: Four times a day (QID) | ORAL | Status: DC | PRN

## 2013-08-03 MED ORDER — IPRATROPIUM BROMIDE 0.02 % IN SOLN
0.5000 mg | Freq: Once | RESPIRATORY_TRACT | Status: AC
  Administered 2013-08-03: 0.5 mg via RESPIRATORY_TRACT
  Filled 2013-08-03: qty 2.5

## 2013-08-03 NOTE — ED Notes (Signed)
Pt breathing well and able to ambulate with crutches.

## 2013-08-03 NOTE — ED Notes (Signed)
Pt. reports right foot pain onset yesterday , denies injury .

## 2013-08-03 NOTE — ED Notes (Signed)
Sized pt for crutches and instructed pt on use of crutches. Pt verbalized that she has used them before and knows how to use them.

## 2013-08-03 NOTE — ED Provider Notes (Signed)
CSN: [id number]     Arrival date & time 08/03/13  0531 History   First MD Initiated Contact with Patient 08/03/13 0719     Chief Complaint  Patient presents with  . Foot Pain     (Consider location/radiation/quality/duration/timing/severity/associated sxs/prior Treatment) HPI Comments: Patient presents with complaint of right foot pain that began yesterday without any injury. Patient is a bus driver and walks frequently as well. She does repetitive motions with her feet. She has not noted color change. Pain is not improved with prednisone prescribed 2 days ago for cough. She denies numbness or tingling. No calf or upper leg pain. No fever, N/V. The onset of this condition was acute. The course is constant. Aggravating factors: bearing weight, palpation. Alleviating factors: rest.   The history is provided by the patient and medical records.    Past Medical History  Diagnosis Date  . Bronchitis   . Heart murmur     "closed by age 34."   Past Surgical History  Procedure Laterality Date  . Rotator cuff repair      Right  . Cystectomy      L wrist  . Tympanostomy tube placement    . Anterior cervical decomp/discectomy fusion  03/19/2012    Procedure: ANTERIOR CERVICAL DECOMPRESSION/DISCECTOMY FUSION 1 LEVEL;  Surgeon: Melina Schools, MD;  Location: [place];  Service: Orthopedics;  Laterality: Left;  Total Disc Replacement C4-5  . Cervical fusion  05/07/2012    Dr Rolena Infante  . Anterior cervical decomp/discectomy fusion N/A 05/07/2012    Procedure: ANTERIOR CERVICAL DECOMPRESSION/DISCECTOMY FUSION 1 LEVEL/HARDWARE REMOVAL;  Surgeon: Melina Schools, MD;  Location: [place];  Service: Orthopedics;  Laterality: N/A;  REMOVAL OF CERVICAL DISC REPLACEMENT AND ACDF C4-5  . Abdominal hysterectomy  2000   Family History  Problem Relation Age of Onset  . Diabetes Mother   . Hypertension Father    History  Substance Use Topics  . Smoking status: Current Every Day Smoker -- 0.50 packs/day for 20 years      Types: Cigarettes  . Smokeless tobacco: Never Used  . Alcohol Use: Yes     Comment: occasional   OB History   Grav Para Term Preterm Abortions TAB SAB Ect Mult Living                 Review of Systems  Constitutional: Positive for activity change.  Respiratory: Positive for cough and wheezing.   Cardiovascular: Negative for chest pain.  Musculoskeletal: Positive for arthralgias and gait problem. Negative for back pain, joint swelling and neck pain.  Skin: Negative for wound.  Neurological: Negative for weakness and numbness.      Allergies  Meloxicam  Home Medications   Prior to Admission medications   Medication Sig Start Date End Date Taking? Authorizing Provider  aspirin 325 MG tablet Take 325 mg by mouth once.    Yes Historical Provider, MD  azithromycin (ZITHROMAX Z-PAK) 250 MG tablet Take 250 mg by mouth daily. 08/01/13 08/05/13 Yes Historical Provider, MD  Multiple Vitamins-Minerals (MULTIVITAMIN WITH MINERALS) tablet Take 1 tablet by mouth daily.   Yes Historical Provider, MD  predniSONE (DELTASONE) 20 MG tablet Take 2 tablets (40 mg total) by mouth daily. 08/01/13  Yes Saddie Benders. Ghim, MD  ibuprofen (ADVIL,MOTRIN) 600 MG tablet Take 1 tablet (600 mg total) by mouth every 6 (six) hours as needed. 08/03/13   Carlisle Cater, PA-C  traMADol (ULTRAM) 50 MG tablet Take 1 tablet (50 mg total) by mouth every 6 (six)  hours as needed. 08/03/13   Carlisle Cater, PA-C   BP 118/84  Pulse 84  Temp(Src) 98.4 F (36.9 C) (Oral)  Resp 18  Ht 5\' 6"  (1.676 m)  Wt 200 lb (90.719 kg)  BMI 32.30 kg/m2  SpO2 98%  Physical Exam  Nursing note and vitals reviewed. Constitutional: She appears well-developed and well-nourished.  HENT:  Head: Normocephalic and atraumatic.  Eyes: Pupils are equal, round, and reactive to light.  Neck: Normal range of motion. Neck supple.  Cardiovascular: Normal rate.  Exam reveals no decreased pulses.   No murmur heard. Pulses:      Dorsalis pedis pulses are  2+ on the right side, and 2+ on the left side.       Posterior tibial pulses are 2+ on the right side, and 2+ on the left side.  Pulmonary/Chest: Effort normal. No respiratory distress. She has wheezes (mild, bases, expiratory). She has no rales. She exhibits no tenderness.  Musculoskeletal: She exhibits tenderness. She exhibits no edema.       Right knee: Normal.       Right ankle: Normal.       Right lower leg: She exhibits no tenderness and no bony tenderness.       Right foot: She exhibits tenderness, bony tenderness and swelling. She exhibits normal range of motion and normal capillary refill.       Feet:  Neurological: She is alert. No sensory deficit.  Motor, sensation, and vascular distal to the injury is fully intact.   Skin: Skin is warm and dry.  Psychiatric: She has a normal mood and affect.    ED Course  Procedures (including critical care time) Labs Review Labs Reviewed - No data to display  Imaging Review Dg Foot Complete Right  08/03/2013   CLINICAL DATA:  Pain and swelling, no injury.  EXAM: RIGHT FOOT COMPLETE - 3+ VIEW  COMPARISON:  DG ANKLE COMPLETE*R* dated 09/23/2009  FINDINGS: There is no evidence of fracture or dislocation. Mild first metatarsophalangeal osteoarthrosis. There is no evidence of arthropathy or other focal bone abnormality. Lateral forefoot soft tissue swelling without subcutaneous gas or radiopaque foreign bodies.  IMPRESSION: Lateral forefoot soft tissue swelling. No acute fracture deformity or dislocation.   Electronically Signed   By: Elon Alas   On: 08/03/2013 06:22     EKG Interpretation None      8:43 AM Patient seen and examined. Will provide pain control crutches.   Patient counseled on use of narcotic pain medications. Counseled not to combine these medications with others containing tylenol. Urged not to drink alcohol, drive, or perform any other activities that requires focus while taking these medications. The patient  verbalizes understanding and agrees with the plan.  Called back to patient room -- she is requesting breathing treatment for bronchitis symptoms because it works better than home inhaler.  Ordered.   PCP and ortho f/u referrals given. Patient has seen Dr. Rolena Infante in past for cervical fusion.   Patient urged to return with worsening symptoms or other concerns. Patient verbalized understanding and agrees with plan.   Vital signs reviewed and are as follows: Filed Vitals:   08/03/13 0810  BP:   Pulse: 84  Temp:   Resp: 18  BP 118/84  Pulse 84  Temp(Src) 98.4 F (36.9 C) (Oral)  Resp 18  Ht 5\' 6"  (1.676 m)  Wt 200 lb (90.719 kg)  BMI 32.30 kg/m2  SpO2 98%   MDM   Final diagnoses:  Foot  pain   Patient with foot pain. X-rays negative. Patient does repetitive motions with feet. Suspect overuse type injury such as arthritis or tendinitis. Full range of motion. No skin signs of cellulitis or trauma. Will provide crutches and pain medicine, NSAID. Patient does have orthopedic followup.   Carlisle Cater, PA-C 08/03/13 [phone]

## 2013-08-03 NOTE — Discharge Instructions (Signed)
Please read and follow all provided instructions.  Your diagnoses today include:  1. Foot pain     Tests performed today include:  An x-ray of the affected area - does NOT show any broken bones  Vital signs. See below for your results today.   Medications prescribed:   Tramadol - narcotic-like pain medication  DO NOT drive or perform any activities that require you to be awake and alert because this medicine can make you drowsy.    Ibuprofen (Motrin, Advil) - anti-inflammatory pain medication  Do not exceed 600mg  ibuprofen every 6 hours, take with food  You have been prescribed an anti-inflammatory medication or NSAID. Take with food. Take smallest effective dose for the shortest duration needed for your pain. Stop taking if you experience stomach pain or vomiting.   Take any prescribed medications only as directed.  Home care instructions:   Follow any educational materials contained in this packet  Follow R.I.C.E. Protocol:  R - rest your injury   I  - use ice on injury without applying directly to skin  C - compress injury with bandage or splint  E - elevate the injury as much as possible  Follow-up instructions: Please follow-up with your primary care provider or the provided orthopedic physician (bone specialist) if you continue to have significant pain or trouble walking in 1 week. In this case you may have a severe injury that requires further care.   If you do not have a primary care doctor -- see below for referral information.   Return instructions:   Please return if your toes are numb or tingling, appear gray or blue, or you have severe pain (also elevate leg and loosen splint or wrap if you were given one)  Please return to the Emergency Department if you experience worsening symptoms.   Please return if you have any other emergent concerns.  Additional Information:  Your vital signs today were: BP 118/84   Pulse 84   Temp(Src) 98.4 F (36.9 C)  (Oral)   Resp 18   Ht 5\' 6"  (1.676 m)   Wt 200 lb (90.719 kg)   BMI 32.30 kg/m2   SpO2 98% If your blood pressure (BP) was elevated above 135/85 this visit, please have this repeated by your doctor within one month. -------------- If prescribed crutches for your injury: use crutches with non-weight bearing for the first few days. Then, you may walk as the pain allows, or as instructed. Start gradually with weight bearing on the affected side. Once you can walk pain free, then try jogging. When you can run forwards, then you can try moving side-to-side. If you cannot walk without crutches in one week, you need a re-check. --------------

## 2013-08-03 NOTE — ED Provider Notes (Signed)
Medical screening examination/treatment/procedure(s) were performed by non-physician practitioner and as supervising physician I was immediately available for consultation/collaboration.   EKG Interpretation None       Varney Biles, MD 08/03/13 2141

## 2013-09-28 ENCOUNTER — Other Ambulatory Visit: Payer: Self-pay | Admitting: Internal Medicine

## 2013-09-28 DIAGNOSIS — N63 Unspecified lump in unspecified breast: Secondary | ICD-10-CM

## 2013-10-07 ENCOUNTER — Ambulatory Visit
Admission: RE | Admit: 2013-10-07 | Discharge: 2013-10-07 | Disposition: A | Discharge status: Home / Self Care | Payer: BC Managed Care – PPO | Source: Ambulatory Visit | Attending: Internal Medicine | Admitting: Internal Medicine

## 2013-10-07 ENCOUNTER — Encounter (INDEPENDENT_AMBULATORY_CARE_PROVIDER_SITE_OTHER): Payer: Self-pay

## 2013-10-07 DIAGNOSIS — N63 Unspecified lump in unspecified breast: Secondary | ICD-10-CM

## 2013-12-05 ENCOUNTER — Emergency Department (HOSPITAL_COMMUNITY)
Admission: EM | Admit: 2013-12-05 | Discharge: 2013-12-05 | Disposition: A | Discharge status: Home / Self Care | Payer: BC Managed Care – PPO | Attending: Emergency Medicine | Admitting: Emergency Medicine

## 2013-12-05 ENCOUNTER — Emergency Department (HOSPITAL_COMMUNITY): Payer: BC Managed Care – PPO

## 2013-12-05 DIAGNOSIS — G44209 Tension-type headache, unspecified, not intractable: Secondary | ICD-10-CM | POA: Diagnosis not present

## 2013-12-05 DIAGNOSIS — F17200 Nicotine dependence, unspecified, uncomplicated: Secondary | ICD-10-CM | POA: Diagnosis not present

## 2013-12-05 DIAGNOSIS — Z8709 Personal history of other diseases of the respiratory system: Secondary | ICD-10-CM | POA: Insufficient documentation

## 2013-12-05 DIAGNOSIS — R52 Pain, unspecified: Secondary | ICD-10-CM | POA: Diagnosis not present

## 2013-12-05 DIAGNOSIS — Z7982 Long term (current) use of aspirin: Secondary | ICD-10-CM | POA: Diagnosis not present

## 2013-12-05 DIAGNOSIS — M25519 Pain in unspecified shoulder: Secondary | ICD-10-CM | POA: Diagnosis not present

## 2013-12-05 DIAGNOSIS — IMO0002 Reserved for concepts with insufficient information to code with codable children: Secondary | ICD-10-CM | POA: Insufficient documentation

## 2013-12-05 DIAGNOSIS — R011 Cardiac murmur, unspecified: Secondary | ICD-10-CM | POA: Insufficient documentation

## 2013-12-05 DIAGNOSIS — M25512 Pain in left shoulder: Secondary | ICD-10-CM

## 2013-12-05 DIAGNOSIS — R51 Headache: Secondary | ICD-10-CM | POA: Insufficient documentation

## 2013-12-05 MED ORDER — METOCLOPRAMIDE HCL 5 MG/ML IJ SOLN
10.0000 mg | Freq: Once | INTRAMUSCULAR | Status: AC
  Administered 2013-12-05: 10 mg via INTRAVENOUS
  Filled 2013-12-05: qty 2

## 2013-12-05 MED ORDER — DIPHENHYDRAMINE HCL 50 MG/ML IJ SOLN
25.0000 mg | Freq: Once | INTRAMUSCULAR | Status: AC
  Administered 2013-12-05: 25 mg via INTRAVENOUS
  Filled 2013-12-05: qty 1

## 2013-12-05 MED ORDER — DEXAMETHASONE SODIUM PHOSPHATE 10 MG/ML IJ SOLN
10.0000 mg | Freq: Once | INTRAMUSCULAR | Status: AC
  Administered 2013-12-05: 10 mg via INTRAVENOUS
  Filled 2013-12-05: qty 1

## 2013-12-05 MED ORDER — SODIUM CHLORIDE 0.9 % IV BOLUS (SEPSIS)
1000.0000 mL | Freq: Once | INTRAVENOUS | Status: AC
  Administered 2013-12-05: 1000 mL via INTRAVENOUS

## 2013-12-05 NOTE — ED Provider Notes (Signed)
CSN: [id number]     Arrival date & time 12/05/13  4854 History   First MD Initiated Contact with Patient 12/05/13 [phone]     Chief Complaint  Patient presents with  . Headache   HPI  History provided by the patient. Patient is a 48 year old female with history of right rotator cuff repair who presents with complaints of persistent general headache and left shoulder pain. Patient reports having left shoulder pain worsening for the past week. On Friday she also began having a headache. She did take some home pain medication which did temporarily relieve the headache but not her shoulder pain. The headache did return however and she's continued to feel comfortable. She denies any injury or trauma to her left shoulder. The swelling does radiate into the neck area. Denies any confusion, fever, chills, sweats, weakness or numbness. No nausea or vomiting.    Past Medical History  Diagnosis Date  . Bronchitis   . Heart murmur     "closed by age 79."   Past Surgical History  Procedure Laterality Date  . Rotator cuff repair      Right  . Cystectomy      L wrist  . Tympanostomy tube placement    . Anterior cervical decomp/discectomy fusion  03/19/2012    Procedure: ANTERIOR CERVICAL DECOMPRESSION/DISCECTOMY FUSION 1 LEVEL;  Surgeon: Melina Schools, MD;  Location: [facility];  Service: Orthopedics;  Laterality: Left;  Total Disc Replacement C4-5  . Cervical fusion  05/07/2012    Dr Rolena Infante  . Anterior cervical decomp/discectomy fusion N/A 05/07/2012    Procedure: ANTERIOR CERVICAL DECOMPRESSION/DISCECTOMY FUSION 1 LEVEL/HARDWARE REMOVAL;  Surgeon: Melina Schools, MD;  Location: [place];  Service: Orthopedics;  Laterality: N/A;  REMOVAL OF CERVICAL DISC REPLACEMENT AND ACDF C4-5  . Abdominal hysterectomy  2000   Family History  Problem Relation Age of Onset  . Diabetes Mother   . Hypertension Father    History  Substance Use Topics  . Smoking status: Current Every Day Smoker -- 0.50 packs/day for 20  years    Types: Cigarettes  . Smokeless tobacco: Never Used  . Alcohol Use: Yes     Comment: occasional   OB History   Grav Para Term Preterm Abortions TAB SAB Ect Mult Living                 Review of Systems  Constitutional: Negative for fever, chills and diaphoresis.  Eyes: Negative for photophobia.  Neurological: Positive for headaches. Negative for dizziness and light-headedness.  All other systems reviewed and are negative.     Allergies  Meloxicam  Home Medications   Prior to Admission medications   Medication Sig Start Date End Date Taking? Authorizing Provider  aspirin 325 MG tablet Take 325 mg by mouth once.     Historical Provider, MD  ibuprofen (ADVIL,MOTRIN) 600 MG tablet Take 1 tablet (600 mg total) by mouth every 6 (six) hours as needed. 08/03/13   Carlisle Cater, PA-C  Multiple Vitamins-Minerals (MULTIVITAMIN WITH MINERALS) tablet Take 1 tablet by mouth daily.    Historical Provider, MD  predniSONE (DELTASONE) 20 MG tablet Take 2 tablets (40 mg total) by mouth daily. 08/01/13   Saddie Benders. Ghim, MD  traMADol (ULTRAM) 50 MG tablet Take 1 tablet (50 mg total) by mouth every 6 (six) hours as needed. 08/03/13   Carlisle Cater, PA-C   BP 144/98  Pulse 80  Temp(Src) 97.8 F (36.6 C) (Oral)  Resp 18  SpO2 98% Physical Exam  Nursing note and vitals reviewed. Constitutional: She is oriented to person, place, and time. She appears well-developed and well-nourished. No distress.  HENT:  Head: Normocephalic and atraumatic.  Eyes: Conjunctivae and EOM are normal. Pupils are equal, round, and reactive to light.  Neck: Normal range of motion. Neck supple.  No meningeal signs  Cardiovascular: Normal rate and regular rhythm.   No murmur heard. Pulmonary/Chest: Effort normal and breath sounds normal. No respiratory distress. She has no wheezes. She has no rales.  Abdominal: Soft. There is no tenderness. There is no rigidity, no rebound, no guarding, no CVA tenderness and no  tenderness at McBurney's point.  Musculoskeletal:  Slightly reduced range of motion left shoulder. There is pain especially with anterior flexion and abduction. No gross deformity or swelling. No popping or clicking in the shoulder. Normal distal strength pulses and sensation in the hand. There is some tenderness over the anterior deltoid and shoulder area. No gross deformities.  Neurological: She is alert and oriented to person, place, and time. She has normal strength. No cranial nerve deficit or sensory deficit. Gait normal.  Skin: Skin is warm and dry. No rash noted.  Psychiatric: She has a normal mood and affect. Her behavior is normal.    ED Course  Procedures   COORDINATION OF CARE:  Nursing notes reviewed. Vital signs reviewed. Initial pt interview and examination performed.   Filed Vitals:   12/05/13 0422  BP: 144/98  Pulse: 80  Temp: 97.8 F (36.6 C)  TempSrc: Oral  Resp: 18  SpO2: 98%    4:04 AM-patient seen and evaluated. Normal nonfocal neuro exam. Has pain in the left shoulder into the trapezius. Also having headache. No concerning or red flag symptoms.  Patient feeling significantly better after medications. Headache and shoulder pain relieved with medications. She is requesting to be discharged at this time. X-ray findings discussed. She will followup for arthritis findings of the shoulder.  Treatment plan initiated: Medications  metoCLOPramide (REGLAN) injection 10 mg (10 mg Intravenous Given 12/05/13 0436)  dexamethasone (DECADRON) injection 10 mg (10 mg Intravenous Given 12/05/13 0436)  diphenhydrAMINE (BENADRYL) injection 25 mg (25 mg Intravenous Given 12/05/13 0436)  sodium chloride 0.9 % bolus 1,000 mL (1,000 mLs Intravenous New Bag/Given 12/05/13 0437)   Dg Shoulder Left  12/05/2013   CLINICAL DATA:  Sudden onset left shoulder pain and stiffness for 3 days.  EXAM: LEFT SHOULDER - 2+ VIEW  COMPARISON:  None.  FINDINGS: Degenerative changes in the acromioclavicular  joint. No evidence of acute fracture or dislocation of the left shoulder. No focal bone lesion or bone destruction. Soft tissues are unremarkable.  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 No displaced fractures identified.   Electronically Signed   By: Lucienne Capers M.D.   On: 12/05/2013 05:49          MDM   Final diagnoses:  Tension-type headache, not intractable, unspecified chronicity pattern  Shoulder pain, acute, left       Martie Lee, PA-C 12/05/13 0701

## 2013-12-05 NOTE — ED Notes (Signed)
Patient here with headache. States that it began 3 days ago and has not stopped. States she has been taking medications at home, but it has had minimal effect. Denies other symptoms aside from some left anterior shoulder pain which she states exacerbates the headache.

## 2013-12-05 NOTE — Discharge Instructions (Signed)
Please follow up with your doctor for continued evaluation and treatment of your shoulder pain.    Shoulder Pain The shoulder is the joint that connects your arms to your body. The bones that form the shoulder joint include the upper arm bone (humerus), the shoulder blade (scapula), and the collarbone (clavicle). The top of the humerus is shaped like a ball and fits into a rather flat socket on the scapula (glenoid cavity). A combination of muscles and strong, fibrous tissues that connect muscles to bones (tendons) support your shoulder joint and hold the ball in the socket. Small, fluid-filled sacs (bursae) are located in different areas of the joint. They act as cushions between the bones and the overlying soft tissues and help reduce friction between the gliding tendons and the bone as you move your arm. Your shoulder joint allows a wide range of motion in your arm. This range of motion allows you to do things like scratch your back or throw a ball. However, this range of motion also makes your shoulder more prone to pain from overuse and injury. Causes of shoulder pain can originate from both injury and overuse and usually can be grouped in the following four categories:  Redness, swelling, and pain (inflammation) of the tendon (tendinitis) or the bursae (bursitis).  Instability, such as a dislocation of the joint.  Inflammation of the joint (arthritis).  Broken bone (fracture). HOME CARE INSTRUCTIONS   Apply ice to the sore area.  Put ice in a plastic bag.  Place a towel between your skin and the bag.  Leave the ice on for 15-20 minutes, 3-4 times per day for the first 2 days, or as directed by your health care provider.  Stop using cold packs if they do not help with the pain.  If you have a shoulder sling or immobilizer, wear it as long as your caregiver instructs. Only remove it to shower or bathe. Move your arm as little as possible, but keep your hand moving to prevent  swelling.  Squeeze a soft ball or foam pad as much as possible to help prevent swelling.  Only take over-the-counter or prescription medicines for pain, discomfort, or fever as directed by your caregiver. SEEK MEDICAL CARE IF:   Your shoulder pain increases, or new pain develops in your arm, hand, or fingers.  Your hand or fingers become cold and numb.  Your pain is not relieved with medicines. SEEK IMMEDIATE MEDICAL CARE IF:   Your arm, hand, or fingers are numb or tingling.  Your arm, hand, or fingers are significantly swollen or turn white or blue. MAKE SURE YOU:   Understand these instructions.  Will watch your condition.  Will get help right away if you are not doing well or get worse. Document Released: 12/26/2004 Document Revised: 08/02/2013 Document Reviewed: 03/02/2011 [facility] Patient Information 2015 [place], [place]. This information is not intended to replace advice given to you by your health care provider. Make sure you discuss any questions you have with your health care provider.    Tension Headache A tension headache is pain, pressure, or aching felt over the front and sides of the head. Tension headaches often come after stress, feeling worried (anxiety), or feeling sad or down for a while (depressed). HOME CARE  Only take medicine as told by your doctor.  Lie down in a dark, quiet room when you have a headache.  Keep a journal to find out if certain things bring on headaches. For example, write down:  What you eat and drink.  How much sleep you get.  Any change to your diet or medicines.  Relax by getting a massage or doing other relaxing activities.  Put ice or heat packs on the head and neck area as told by your doctor.  Lessen stress.  Sit up straight. Do not tighten (tense) your muscles.  Quit smoking if you smoke.  Lessen how much alcohol you drink.  Lessen how much caffeine you drink, or stop drinking caffeine.  Eat and exercise  regularly.  Get enough sleep.  Avoid using too much pain medicine. GET HELP RIGHT AWAY IF:   Your headache becomes really bad.  You have a fever.  You have a stiff neck.  You have trouble seeing.  Your muscles are weak, or you lose muscle control.  You lose your balance or have trouble walking.  You feel like you will pass out (faint), or you pass out.  You have really bad symptoms that are different than your first symptoms.  You have problems with the medicines given to you by your doctor.  Your medicines do not work.  Your headache feels different than the other headaches.  You feel sick to your stomach (nauseous) or throw up (vomit). MAKE SURE YOU:   Understand these instructions.  Will watch your condition.  Will get help right away if you are not doing well or get worse. Document Released: 06/12/2009 Document Revised: 06/10/2011 Document Reviewed: 03/08/2011 [facility] Patient Information 2015 [place], [place]. This information is not intended to replace advice given to you by your health care provider. Make sure you discuss any questions you have with your health care provider.

## 2013-12-06 NOTE — ED Provider Notes (Signed)
Medical screening examination/treatment/procedure(s) were performed by non-physician practitioner and as supervising physician I was immediately available for consultation/collaboration.   EKG Interpretation None        Julianne Rice, MD 12/06/13 [phone]

## 2013-12-15 ENCOUNTER — Emergency Department (HOSPITAL_COMMUNITY)
Admission: EM | Admit: 2013-12-15 | Discharge: 2013-12-15 | Disposition: A | Discharge status: Home / Self Care | Payer: BC Managed Care – PPO | Attending: Emergency Medicine | Admitting: Emergency Medicine

## 2013-12-15 ENCOUNTER — Emergency Department (HOSPITAL_COMMUNITY): Payer: BC Managed Care – PPO

## 2013-12-15 ENCOUNTER — Encounter (HOSPITAL_COMMUNITY): Payer: Self-pay | Admitting: Emergency Medicine

## 2013-12-15 DIAGNOSIS — R05 Cough: Secondary | ICD-10-CM | POA: Insufficient documentation

## 2013-12-15 DIAGNOSIS — J40 Bronchitis, not specified as acute or chronic: Secondary | ICD-10-CM | POA: Diagnosis not present

## 2013-12-15 DIAGNOSIS — R011 Cardiac murmur, unspecified: Secondary | ICD-10-CM | POA: Diagnosis not present

## 2013-12-15 DIAGNOSIS — F17200 Nicotine dependence, unspecified, uncomplicated: Secondary | ICD-10-CM | POA: Insufficient documentation

## 2013-12-15 DIAGNOSIS — Z79899 Other long term (current) drug therapy: Secondary | ICD-10-CM | POA: Insufficient documentation

## 2013-12-15 DIAGNOSIS — Z7982 Long term (current) use of aspirin: Secondary | ICD-10-CM | POA: Insufficient documentation

## 2013-12-15 DIAGNOSIS — R059 Cough, unspecified: Secondary | ICD-10-CM | POA: Diagnosis present

## 2013-12-15 MED ORDER — HYDROCODONE-HOMATROPINE 5-1.5 MG/5ML PO SYRP: 5.0000 mL | ORAL_SOLUTION | Freq: Four times a day (QID) | ORAL | Status: DC | PRN

## 2013-12-15 MED ORDER — ALBUTEROL SULFATE HFA 108 (90 BASE) MCG/ACT IN AERS: 1.0000 | INHALATION_SPRAY | Freq: Four times a day (QID) | RESPIRATORY_TRACT | Status: DC | PRN

## 2013-12-15 MED ORDER — ACETAMINOPHEN 325 MG PO TABS
650.0000 mg | ORAL_TABLET | Freq: Once | ORAL | Status: AC
  Administered 2013-12-15: 650 mg via ORAL

## 2013-12-15 MED ORDER — ALBUTEROL SULFATE (2.5 MG/3ML) 0.083% IN NEBU
5.0000 mg | INHALATION_SOLUTION | Freq: Once | RESPIRATORY_TRACT | Status: AC
  Administered 2013-12-15: 5 mg via RESPIRATORY_TRACT
  Filled 2013-12-15: qty 6

## 2013-12-15 MED ORDER — GUAIFENESIN 100 MG/5ML PO SOLN
5.0000 mL | Freq: Once | ORAL | Status: AC
  Administered 2013-12-15: 100 mg via ORAL
  Filled 2013-12-15: qty 5

## 2013-12-15 MED ORDER — ALBUTEROL SULFATE HFA 108 (90 BASE) MCG/ACT IN AERS
2.0000 | INHALATION_SPRAY | Freq: Once | RESPIRATORY_TRACT | Status: AC
  Administered 2013-12-15: 2 via RESPIRATORY_TRACT
  Filled 2013-12-15: qty 6.7

## 2013-12-15 NOTE — ED Notes (Signed)
C/o cough & HA, productive (yellow phlegm), (denies: nvd, fever, sore throat, dizziness or bleeding), no meds PTA.

## 2013-12-15 NOTE — ED Provider Notes (Signed)
CSN: [id number]     Arrival date & time 12/15/13  0353 History   None    Chief Complaint  Patient presents with  . Cough  . Headache     (Consider location/radiation/quality/duration/timing/severity/associated sxs/prior Treatment) HPI Comments: Patient is a 48 year old female past medical history significant for tobacco abuse presenting to the emergency department for 2 day history of productive cough with green phlegm with intermittent episodes of sinus pressure and headache. Denies any headache currently. Patient denies any alleviating factors. States her cough is worse in the evening. She has not tried any medications prior to arrival. Denies any sick contacts.   Past Medical History  Diagnosis Date  . Bronchitis   . Heart murmur     "closed by age 42."   Past Surgical History  Procedure Laterality Date  . Rotator cuff repair      Right  . Cystectomy      L wrist  . Tympanostomy tube placement    . Anterior cervical decomp/discectomy fusion  03/19/2012    Procedure: ANTERIOR CERVICAL DECOMPRESSION/DISCECTOMY FUSION 1 LEVEL;  Surgeon: Melina Schools, MD;  Location: [place];  Service: Orthopedics;  Laterality: Left;  Total Disc Replacement C4-5  . Cervical fusion  05/07/2012    Dr Rolena Infante  . Anterior cervical decomp/discectomy fusion N/A 05/07/2012    Procedure: ANTERIOR CERVICAL DECOMPRESSION/DISCECTOMY FUSION 1 LEVEL/HARDWARE REMOVAL;  Surgeon: Melina Schools, MD;  Location: [place];  Service: Orthopedics;  Laterality: N/A;  REMOVAL OF CERVICAL DISC REPLACEMENT AND ACDF C4-5  . Abdominal hysterectomy  2000   Family History  Problem Relation Age of Onset  . Diabetes Mother   . Hypertension Father    History  Substance Use Topics  . Smoking status: Current Every Day Smoker -- 0.50 packs/day for 20 years    Types: Cigarettes  . Smokeless tobacco: Never Used  . Alcohol Use: Yes     Comment: occasional   OB History   Grav Para Term Preterm Abortions TAB SAB Ect Mult Living              Review of Systems  Constitutional: Negative for fever and chills.  Respiratory: Positive for cough.   All other systems reviewed and are negative.     Allergies  Meloxicam  Home Medications   Prior to Admission medications   Medication Sig Start Date End Date Taking? Authorizing Provider  aspirin 325 MG tablet Take 325 mg by mouth every 6 (six) hours as needed for mild pain.    Yes Historical Provider, MD  Efinaconazole (JUBLIA) 10 % SOLN Apply 1 application topically every other day.   Yes Historical Provider, MD  simvastatin (ZOCOR) 10 MG tablet Take 10 mg by mouth daily.   Yes Historical Provider, MD  terbinafine (LAMISIL) 250 MG tablet Take 250 mg by mouth daily.   Yes Historical Provider, MD  albuterol (PROVENTIL HFA;VENTOLIN HFA) 108 (90 BASE) MCG/ACT inhaler Inhale 1-2 puffs into the lungs every 6 (six) hours as needed for wheezing or shortness of breath. 12/15/13   Glory Graefe L Anwen Cannedy, PA-C  HYDROcodone-homatropine (HYCODAN) 5-1.5 MG/5ML syrup Take 5 mLs by mouth every 6 (six) hours as needed for cough. 12/15/13   Cosby Proby L Rian Koon, PA-C   BP 128/80  Pulse 78  Temp(Src) 98 F (36.7 C) (Oral)  Resp 24  SpO2 95% Physical Exam  Nursing note and vitals reviewed. Constitutional: She is oriented to person, place, and time. She appears well-developed and well-nourished. No distress.  HENT:  Head: Normocephalic  and atraumatic.  Right Ear: Hearing, tympanic membrane, external ear and ear canal normal.  Left Ear: Hearing, tympanic membrane, external ear and ear canal normal.  Nose: Nose normal.  Mouth/Throat: Uvula is midline, oropharynx is clear and moist and mucous membranes are normal. No oropharyngeal exudate.  Eyes: Conjunctivae are normal.  Neck: Normal range of motion. Neck supple.  Cardiovascular: Normal rate, regular rhythm and normal heart sounds.   Pulmonary/Chest: Effort normal and breath sounds normal. No respiratory distress. She exhibits no  tenderness.  Abdominal: Soft. There is no tenderness.  Musculoskeletal: Normal range of motion. She exhibits no edema.  Neurological: She is alert and oriented to person, place, and time.  Skin: Skin is warm and dry. She is not diaphoretic.  Psychiatric: She has a normal mood and affect.    ED Course  Procedures (including critical care time) Medications  albuterol (PROVENTIL) (2.5 MG/3ML) 0.083% nebulizer solution 5 mg (5 mg Nebulization Given 12/15/13 0410)  acetaminophen (TYLENOL) tablet 650 mg (650 mg Oral Given 12/15/13 0410)  guaiFENesin (ROBITUSSIN) 100 MG/5ML solution 100 mg (100 mg Oral Given 12/15/13 0635)  albuterol (PROVENTIL HFA;VENTOLIN HFA) 108 (90 BASE) MCG/ACT inhaler 2 puff (2 puffs Inhalation Given 12/15/13 0636)    Labs Review Labs Reviewed - No data to display  Imaging Review Dg Chest 2 View  12/15/2013   CLINICAL DATA:  Cough and headache for 2 days.  EXAM: CHEST  2 VIEW  COMPARISON:  Chest radiograph May 06, 2013  FINDINGS: Cardiomediastinal silhouette is unremarkable. Similar bronchitic changes. The lungs are otherwise clear without pleural effusions or focal consolidations. Trachea projects midline and there is no pneumothorax. Soft tissue planes and included osseous structures are non-suspicious. ACDF.  IMPRESSION: Similar bronchitic changes without focal consolidation.   Electronically Signed   By: Elon Alas   On: 12/15/2013 05:34     EKG Interpretation None      MDM   Final diagnoses:  Bronchitis    Filed Vitals:   12/15/13 0638  BP:   Pulse:   Temp: 98 F (36.7 C)  Resp:    Afebrile, NAD, non-toxic appearing, AAOx4.  Pt CXR negative for acute infiltrate. Patients symptoms are consistent with bronchitis likely viral etiology. Discussed that antibiotics are not indicated for viral infections. Pt will be discharged with symptomatic treatment.  Verbalizes understanding and is agreeable with plan. Pt is hemodynamically stable & in NAD prior  to dc. Patient is stable at time of discharge      Harlow Mares, PA-C 12/15/13 [id number]

## 2013-12-15 NOTE — Discharge Instructions (Signed)
Please follow up with your primary care physician in 1-2 days. If you do not have one please call the [place] number listed above. Please use your inhaler 2 puffs every four to six hours for cough or shortness of breath. Please read all discharge instructions and return precautions.   Acute Bronchitis Bronchitis is inflammation of the airways that extend from the windpipe into the lungs (bronchi). The inflammation often causes mucus to develop. This leads to a cough, which is the most common symptom of bronchitis.  In acute bronchitis, the condition usually develops suddenly and goes away over time, usually in a couple weeks. Smoking, allergies, and asthma can make bronchitis worse. Repeated episodes of bronchitis may cause further lung problems.  CAUSES Acute bronchitis is most often caused by the same virus that causes a cold. The virus can spread from person to person (contagious) through coughing, sneezing, and touching contaminated objects. SIGNS AND SYMPTOMS   Cough.   Fever.   Coughing up mucus.   Body aches.   Chest congestion.   Chills.   Shortness of breath.   Sore throat.  DIAGNOSIS  Acute bronchitis is usually diagnosed through a physical exam. Your health care provider will also ask you questions about your medical history. Tests, such as chest X-rays, are sometimes done to rule out other conditions.  TREATMENT  Acute bronchitis usually goes away in a couple weeks. Oftentimes, no medical treatment is necessary. Medicines are sometimes given for relief of fever or cough. Antibiotic medicines are usually not needed but may be prescribed in certain situations. In some cases, an inhaler may be recommended to help reduce shortness of breath and control the cough. A cool mist vaporizer may also be used to help thin bronchial secretions and make it easier to clear the chest.  HOME CARE INSTRUCTIONS  Get plenty of rest.   Drink enough fluids to  keep your urine clear or pale yellow (unless you have a medical condition that requires fluid restriction). Increasing fluids may help thin your respiratory secretions (sputum) and reduce chest congestion, and it will prevent dehydration.   Take medicines only as directed by your health care provider.  If you were prescribed an antibiotic medicine, finish it all even if you start to feel better.  Avoid smoking and secondhand smoke. Exposure to cigarette smoke or irritating chemicals will make bronchitis worse. If you are a smoker, consider using nicotine gum or skin patches to help control withdrawal symptoms. Quitting smoking will help your lungs heal faster.   Reduce the chances of another bout of acute bronchitis by washing your hands frequently, avoiding people with cold symptoms, and trying not to touch your hands to your mouth, nose, or eyes.   Keep all follow-up visits as directed by your health care provider.  SEEK MEDICAL CARE IF: Your symptoms do not improve after 1 week of treatment.  SEEK IMMEDIATE MEDICAL CARE IF:  You develop an increased fever or chills.   You have chest pain.   You have severe shortness of breath.  You have bloody sputum.   You develop dehydration.  You faint or repeatedly feel like you are going to pass out.  You develop repeated vomiting.  You develop a severe headache. MAKE SURE YOU:   Understand these instructions.  Will watch your condition.  Will get help right away if you are not doing well or get worse. Document Released: 04/25/2004 Document Revised: 08/02/2013 Document Reviewed: 09/08/2012 [employer] Patient Information 2015  [employer]. This information is not intended to replace advice given to you by your health care provider. Make sure you discuss any questions you have with your health care provider.

## 2013-12-17 NOTE — ED Provider Notes (Signed)
Medical screening examination/treatment/procedure(s) were performed by non-physician practitioner and as supervising physician I was immediately available for consultation/collaboration.    Delora Fuel, MD 35/52/17 4715

## 2014-01-26 ENCOUNTER — Encounter (HOSPITAL_COMMUNITY): Payer: Self-pay | Admitting: Emergency Medicine

## 2014-01-26 ENCOUNTER — Emergency Department (HOSPITAL_COMMUNITY): Payer: BC Managed Care – PPO

## 2014-01-26 DIAGNOSIS — J209 Acute bronchitis, unspecified: Secondary | ICD-10-CM | POA: Diagnosis not present

## 2014-01-26 DIAGNOSIS — Z720 Tobacco use: Secondary | ICD-10-CM | POA: Diagnosis not present

## 2014-01-26 DIAGNOSIS — R51 Headache: Secondary | ICD-10-CM | POA: Insufficient documentation

## 2014-01-26 DIAGNOSIS — R011 Cardiac murmur, unspecified: Secondary | ICD-10-CM | POA: Diagnosis not present

## 2014-01-26 DIAGNOSIS — R05 Cough: Secondary | ICD-10-CM | POA: Diagnosis present

## 2014-01-26 NOTE — ED Notes (Signed)
Pt. reports productive cough , runny nose/nasal congestion  , chest congestion , chills and body aches onset yesterday .

## 2014-01-27 ENCOUNTER — Emergency Department (HOSPITAL_COMMUNITY)
Admission: EM | Admit: 2014-01-27 | Discharge: 2014-01-27 | Disposition: A | Discharge status: Home / Self Care | Payer: BC Managed Care – PPO | Attending: Emergency Medicine | Admitting: Emergency Medicine

## 2014-01-27 DIAGNOSIS — J40 Bronchitis, not specified as acute or chronic: Secondary | ICD-10-CM

## 2014-01-27 DIAGNOSIS — R062 Wheezing: Secondary | ICD-10-CM

## 2014-01-27 DIAGNOSIS — R059 Cough, unspecified: Secondary | ICD-10-CM

## 2014-01-27 DIAGNOSIS — R05 Cough: Secondary | ICD-10-CM

## 2014-01-27 MED ORDER — ACETAMINOPHEN 500 MG PO TABS
1000.0000 mg | ORAL_TABLET | Freq: Once | ORAL | Status: AC
  Administered 2014-01-27: 1000 mg via ORAL
  Filled 2014-01-27: qty 2

## 2014-01-27 MED ORDER — METOCLOPRAMIDE HCL 10 MG PO TABS
10.0000 mg | ORAL_TABLET | Freq: Once | ORAL | Status: AC
  Administered 2014-01-27: 10 mg via ORAL
  Filled 2014-01-27: qty 1

## 2014-01-27 MED ORDER — PREDNISONE 20 MG PO TABS: 60.0000 mg | ORAL_TABLET | Freq: Every day | ORAL | Status: DC

## 2014-01-27 MED ORDER — AZITHROMYCIN 250 MG PO TABS: 250.0000 mg | ORAL_TABLET | Freq: Every day | ORAL | Status: DC

## 2014-01-27 MED ORDER — IPRATROPIUM BROMIDE 0.02 % IN SOLN
1.0000 mg | Freq: Once | RESPIRATORY_TRACT | Status: AC
  Administered 2014-01-27: 1 mg via RESPIRATORY_TRACT
  Filled 2014-01-27 (×2): qty 5

## 2014-01-27 MED ORDER — PREDNISONE 20 MG PO TABS
60.0000 mg | ORAL_TABLET | Freq: Once | ORAL | Status: AC
  Administered 2014-01-27: 60 mg via ORAL
  Filled 2014-01-27: qty 3

## 2014-01-27 MED ORDER — IPRATROPIUM-ALBUTEROL 0.5-2.5 (3) MG/3ML IN SOLN
3.0000 mL | RESPIRATORY_TRACT | Status: DC
  Administered 2014-01-27: 3 mL via RESPIRATORY_TRACT
  Filled 2014-01-27: qty 3

## 2014-01-27 MED ORDER — ALBUTEROL (5 MG/ML) CONTINUOUS INHALATION SOLN
10.0000 mg/h | INHALATION_SOLUTION | RESPIRATORY_TRACT | Status: DC
  Administered 2014-01-27: 10 mg/h via RESPIRATORY_TRACT
  Filled 2014-01-27: qty 20

## 2014-01-27 NOTE — ED Provider Notes (Signed)
CSN: [id number]     Arrival date & time 01/26/14  2246 History   First MD Initiated Contact with Patient 01/27/14 0115     Chief Complaint  Patient presents with  . Cough     (Consider location/radiation/quality/duration/timing/severity/associated sxs/prior Treatment) HPI Alisha Espinoza is a 48 y.o. female with past medical history of bronchitis coming in with headache and cough. Patient states she's been coughing for the past 2 days, it is productive of yellow sputum. She's had very mild body aches as well. 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 She also describes a frontal headache that was gradual in onset. This is associated with all of her other symptoms. She denies any stiff neck, blurry vision, muscle weakness, or numbness and tingling. Patient has denied any fevers during the interval. She has no chest pain or shortness of breath. She just feels like she has sputum in her throat.  10 Systems reviewed and are negative for acute change except as noted in the HPI.     Past Medical History  Diagnosis Date  . Bronchitis   . Heart murmur     "closed by age 35."   Past Surgical History  Procedure Laterality Date  . Rotator cuff repair      Right  . Cystectomy      L wrist  . Tympanostomy tube placement    . Anterior cervical decomp/discectomy fusion  03/19/2012    Procedure: ANTERIOR CERVICAL DECOMPRESSION/DISCECTOMY FUSION 1 LEVEL;  Surgeon: Melina Schools, MD;  Location: [place];  Service: Orthopedics;  Laterality: Left;  Total Disc Replacement C4-5  . Cervical fusion  05/07/2012    Dr Rolena Infante  . Anterior cervical decomp/discectomy fusion N/A 05/07/2012    Procedure: ANTERIOR CERVICAL DECOMPRESSION/DISCECTOMY FUSION 1 LEVEL/HARDWARE REMOVAL;  Surgeon: Melina Schools, MD;  Location: [place];  Service: Orthopedics;  Laterality: N/A;  REMOVAL OF CERVICAL DISC REPLACEMENT AND ACDF C4-5  . Abdominal hysterectomy  2000   Family History  Problem Relation Age of Onset   . Diabetes Mother   . Hypertension Father    History  Substance Use Topics  . Smoking status: Current Every Day Smoker -- 0.50 packs/day for 20 years    Types: Cigarettes  . Smokeless tobacco: Never Used  . Alcohol Use: Yes     Comment: occasional   OB History   Grav Para Term Preterm Abortions TAB SAB Ect Mult Living                 Review of Systems    Allergies  Meloxicam  Home Medications   Prior to Admission medications   Medication Sig Start Date End Date Taking? Authorizing Provider  aspirin 325 MG tablet Take 325 mg by mouth every 6 (six) hours as needed for mild pain.    Yes Historical Provider, MD  ibuprofen (ADVIL,MOTRIN) 200 MG tablet Take 200 mg by mouth every 6 (six) hours as needed for mild pain.   Yes Historical Provider, MD   BP 141/92  Pulse 92  Temp(Src) 98.2 F (36.8 C) (Oral)  Resp 18  Ht 5\' 6"  (1.676 m)  Wt 200 lb (90.719 kg)  BMI 32.30 kg/m2  SpO2 98% Physical Exam  Nursing note and vitals reviewed. Constitutional: She is oriented to person, place, and time. She appears well-developed and well-nourished. No distress.  HENT:  Head: Normocephalic and atraumatic.  Nose: Nose normal.  Mouth/Throat: Oropharynx is clear and moist. No oropharyngeal exudate.  Eyes: Conjunctivae and  EOM are normal. Pupils are equal, round, and reactive to light. No scleral icterus.  Neck: Normal range of motion. Neck supple. No JVD present. No tracheal deviation present. No thyromegaly present.  Cardiovascular: Normal rate, regular rhythm and normal heart sounds.  Exam reveals no gallop and no friction rub.   No murmur heard. Pulmonary/Chest: Effort normal. No respiratory distress. She has wheezes. She exhibits no tenderness.  Diffuse wheezing heard expiratory and posterior lung fields. No tachypnea and no increased work of breathing no respirator distress.  Abdominal: Soft. Bowel sounds are normal. She exhibits no distension and no mass. There is no tenderness.  There is no rebound and no guarding.  Musculoskeletal: Normal range of motion. She exhibits no edema and no tenderness.  Lymphadenopathy:    She has no cervical adenopathy.  Neurological: She is alert and oriented to person, place, and time. No cranial nerve deficit. She exhibits normal muscle tone.  Skin: Skin is warm and dry. No rash noted. She is not diaphoretic. No erythema. No pallor.    ED Course  Procedures (including critical care time) Labs Review Labs Reviewed - No data to display  Imaging Review Dg Chest 2 View  01/27/2014   CLINICAL DATA:  Cough and congestion for 2 days.  EXAM: CHEST  2 VIEW  COMPARISON:  12/15/2013  FINDINGS: Chronic bronchitic markings. No evidence for focal pneumonia. Normal heart size and mediastinal contours. No effusion or pneumothorax.  IMPRESSION: Bronchitis without focal pneumonia.   Electronically Signed   By: Jorje Guild M.D.   On: 01/27/2014 00:32     EKG Interpretation None      MDM   Final diagnoses:  Cough    Patient consents emergency department for cough, headache, body aches. Chest x-ray is negative for pneumonia. She is a smoker and carries no diagnosis of COPD however she is wheezing on exam. We'll give breathing treatments and prednisone for treatment and reassess.  Upon my repeat assessment of the patient her wheezing has resolved. She initially had rhonchi which cleared with a cough. Patient states she feels better as well. She was given Reglan and Tylenol for her headache. She is advised to continue prednisone and azithromycin for 5 day course. Patient will need primary care follow-up within 3 days, her vital signs remained within normal limits and she is safe for discharge.  Everlene Balls, MD 01/27/14 [phone]

## 2014-01-27 NOTE — ED Notes (Signed)
Treatments switched to hour long nebulizer. Pt made aware of plan of care.  No allergies to peanuts reported.

## 2014-01-27 NOTE — Discharge Instructions (Signed)
Acute Bronchitis Alisha Espinoza, you were seen today for coughing and wheezing. Continue to take prednisone and azithromycin at home for 5 day course and follow up with your primary care physician within 3 days for continued treatment. If your symptoms worsen, back to the emergency department for repeat evaluation.  Thank you. Bronchitis is when the airways that extend from the windpipe into the lungs get red, puffy, and painful (inflamed). Bronchitis often causes thick spit (mucus) to develop. This leads to a cough. A cough is the most common symptom of bronchitis. In acute bronchitis, the condition usually begins suddenly and goes away over time (usually in 2 weeks). Smoking, allergies, and asthma can make bronchitis worse. Repeated episodes of bronchitis may cause more lung problems. HOME CARE  Rest.  Drink enough fluids to keep your pee (urine) clear or pale yellow (unless you need to limit fluids as told by your doctor).  Only take over-the-counter or prescription medicines as told by your doctor.  Avoid smoking and secondhand smoke. These can make bronchitis worse. If you are a smoker, think about using nicotine gum or skin patches. Quitting smoking will help your lungs heal faster.  Reduce the chance of getting bronchitis again by:  Washing your hands often.  Avoiding people with cold symptoms.  Trying not to touch your hands to your mouth, nose, or eyes.  Follow up with your doctor as told. GET HELP IF: Your symptoms do not improve after 1 week of treatment. Symptoms include:  Cough.  Fever.  Coughing up thick spit.  Body aches.  Chest congestion.  Chills.  Shortness of breath.  Sore throat. GET HELP RIGHT AWAY IF:   You have an increased fever.  You have chills.  You have severe shortness of breath.  You have bloody thick spit (sputum).  You throw up (vomit) often.  You lose too much body fluid (dehydration).  You have a severe headache.  You faint. MAKE  SURE YOU:   Understand these instructions.  Will watch your condition.  Will get help right away if you are not doing well or get worse. Document Released: 09/04/2007 Document Revised: 11/18/2012 Document Reviewed: 09/08/2012 [facility] Patient Information 2015 [place], [place]. This information is not intended to replace advice given to you by your health care provider. Make sure you discuss any questions you have with your health care provider.  Emergency Department Resource Guide 1) Find a Doctor and Pay Out of Pocket Although you won't have to find out who is covered by your insurance plan, it is a good idea to ask around and get recommendations. You will then need to call the office and see if the doctor you have chosen will accept you as a new patient and what types of options they offer for patients who are self-pay. Some doctors offer discounts or will set up payment plans for their patients who do not have insurance, but you will need to ask so you aren't surprised when you get to your appointment.  2) Contact Your Local Health Department Not all health departments have doctors that can see patients for sick visits, but many do, so it is worth a call to see if yours does. If you don't know where your local health department is, you can check in your phone book. The CDC also has a tool to help you locate your state's health department, and many state websites also have listings of all of their local health departments.  3) Find a [facility] If  your illness is not likely to be very severe or complicated, you may want to try a walk in clinic. These are popping up all over the country in pharmacies, drugstores, and shopping centers. They're usually staffed by nurse practitioners or physician assistants that have been trained to treat common illnesses and complaints. They're usually fairly quick and inexpensive. However, if you have serious medical issues or chronic medical problems, these are  probably not your best option.  No Primary Care Doctor: - Call Health Connect at  [phone] - they can help you locate a primary care doctor that  accepts your insurance, provides certain services, etc. - Physician Referral Service- [phone]  Chronic Pain Problems: Organization         Address  Phone   Notes  [facility]  [phone] Patients need to be referred by their primary care doctor.   Medication Assistance: Organization         Address  Phone   Notes  [facility] [address] [place], [place] [address] [phone] --Must be a resident of [facility] -- Must have NO insurance coverage whatsoever (no Medicaid/ Medicare, etc.) -- The pt. MUST have a primary care doctor that directs their care regularly and follows them in the community   [facility]ist  [phone]   [employer]  [phone]    Agencies that provide inexpensive medical care: Organization         Address  Phone   Notes  [place]  [phone]   [facility]    [phone]   [facility] [place], [place] [address] [phone]   [place] [address] [place] [phone]   Planned Parenthood    [phone]   [facility]    [phone]   [place] and [place] [address], [place] Phone:  [phone], Fax:  [phone] Hours of Operation:  9 am - 6 pm, M-F.  Also accepts Medicaid/Medicare and self-pay.  [facility] for [place], [address], [place] Phone: [phone], Fax: [phone]. Hours of Operation:  8:30 am - 5:30 pm, M-F.  Also accepts Medicaid and self-pay.  [facility] [place] [address], [place] Phone: [phone]   [place], [place], [place] [phone], Ext. [phone] Mondays & Thursdays:  7-9 AM.  First 15 patients are seen on a first come, first serve basis.    [place] Providers:  Organization         Address  Phone   Notes  [facility] [address], [place] [phone] Also accepts self-pay patients.  [facility] [address], [place]  [phone]   [place], [address], [place] [phone]   [facility] [address] [place] [phone]   Lucianne Lei [address], [place]   [phone] Only accepts [place] Access [place] patients after they have their name applied to their card.   Self-Pay (no insurance) in [facility]:  Organization         Address  Phone   Notes  Sickle Cell Patients, [facility] Internal [facility] [place], [place] [phone]  [facility] [place] [phone]   [facility] [place]  [place], [address], [place] [phone]   [facility]/Dr. Osei-Bonsu  [address], [place] or [address], [place] [phone] Phone number for both [place] and [place] locations is the same.  [facility] [address], [place] [phone]   [facility] [address], [place] or [address] [phone]   [facility] [address] [place] [phone], phone; [phone], fax Sees patients 1st and 3rd Saturday of every month.  Must not qualify for public or private insurance (i.e. Medicaid, Medicare, [place] Health Choice, Veterans' Benefits)  Household income should be no more than 200% of the poverty level The clinic cannot treat you if you are pregnant or think you are pregnant  Sexually transmitted diseases are not treated at the clinic.     Dental Care: Organization         Address  Phone  Notes  [facility] Department of [facility] [place] [phone] Accepts children up to age 73 who are enrolled in [place] or [place]; pregnant women with a Medicaid card; and children who have applied for Medicaid or [place] Health Choice, but were declined, whose parents can pay a reduced fee at time of service.  [facility] Department of [facility]  [address], [place] [phone] Accepts children up to age 48 who are enrolled in [place] or [place]; pregnant women with a Medicaid card; and children who have applied for Medicaid or [place] Health Choice, but were declined, whose parents can pay a reduced fee at time of service.  [place] Adult [facility] Access PROGRAM  [place] [phone] Patients are seen by appointment only. Walk-ins are not accepted. [place] will see patients 13 years of age and older. Monday - Tuesday (8am-5pm) Most Wednesdays (8:30-5pm) $30 per visit, cash only  [facility] Adult Dental Access PROGRAM  [address], [facility] [phone] Patients are seen by appointment only. Walk-ins are not accepted. [place] will see patients 71 years of age and older. One Wednesday Evening (Monthly: Volunteer Based).  $30 per visit, cash only  [place]  [phone] for adults; Children under age 29, call Graduate Pediatric Dentistry at [phone]. Children aged 48-14, please call [phone] to request a pediatric application.  Dental services are provided in all areas of dental care including fillings, crowns and bridges, complete and partial dentures, implants, gum treatment, root canals, and extractions. Preventive care is also provided. Treatment is provided to both adults and children. Patients are selected via a lottery and there is often a waiting  list.   [facility] [address], [place]  [phone] [email or web address]   [facility] [address] [place], [place] [phone], Ext. [phone] Second and Fourth Thursday of each month, opens at 6:30 AM; Clinic ends at 9 AM.  Patients are seen on a first-come first-served basis, and a limited number are seen during each clinic.   [facility]  [address] Hillard Danker [place], [place] [phone]   Eligibility Requirements You must have lived in [place], [place], or [place] counties for at least the  last three months.   You cannot be eligible for state or federal sponsored [employer], including [employer], [place], or [employer].   You generally cannot be eligible for healthcare insurance through your employer.    How to apply: Eligibility screenings are held every Tuesday and Wednesday afternoon from 1:00 pm until 4:00 pm. You do not need an appointment for the interview!  [facility] [address], [place], [place]   [place]  [place] Department  [place]  [phone]    Behavioral Health Resources in the Community: Intensive Outpatient Programs Organization         Address  Phone  Notes  [place]. [address], [place], [place] [phone]   [facility]patient [address], [place], [place]   ADS: Alcohol & Drug Svcs [address], [place], [place]   [place] [address],  [place], [place] or [phone]   Substance Abuse Resources Organization         Address  Phone  Notes  Alcohol and Drug Services  9311378157   [place]  [phone]   [facility] [place]   Chinita Pester  [phone]   Residential & Outpatient Substance Abuse Program   [phone]   Psychological Services Organization         Address  Phone  Notes  [facility] [place]  [place]  [phone]   Luis M. [place] [address], [place] or [phone]    Mobile Crisis Teams Organization         Address  Phone  Notes  [facility], Mobile Crisis Care Unit  [phone]   [facility]  [address] [place], [place]   [place] [address] [place] [phone]    Self-Help/Support Groups Organization         Address  Phone             Notes  [place]. of [place] - variety of support groups  [place] Call for more information  Narcotics Anonymous (NA), [facility]aring [facility] [address], [employer] [place]  2 meetings at this location   Special educational needs teacher         Address  Phone  Notes  [facility] [place],    [place]  [phone]   [facility]  [address] [place] [address]14, [place], [place]   [place], [place] [phone] Admissions: 8am-3pm M-F  Incentives Substance [place] [address],    [place], [place] [phone]   [facility] [address] [place], [place], [place]   [facility] [address],  [place], [place]   [facility] - Intensive Outpatient [address], [place], [place]   [facility] ([place].,  [place], [place] [phone] or [phone]   [facility] Services (RTS) [address], [place], [place] Accepts Medicaid  [facility] [place] [address],  [place] [phone] Substance Abuse/Addiction Treatment   [facility] Organization          Address  Phone  Notes  [facility]  [phone]  Domenic Schwab, PhD [address] Arlis Porta [place], [place]   [phone] or [phone]   [place], [place] [phone]   [address], [place], [place] [phone] Insurance/Medicaid/sponsorship through [facility] and Families [address], [address] [place]                                    [place], [place] [phone] [place] [address], [place] [phone]    Dr. Adele Schilder  [phone]   [facility] [place] Dept. 1) [address] [place] 2) [place] 3)  [place] [address], [place] [phone]  [phone]   [place] [phone] or [phone] (After Hours)

## 2014-01-30 ENCOUNTER — Ambulatory Visit (HOSPITAL_COMMUNITY)
Admission: RE | Admit: 2014-01-30 | Discharge: 2014-01-30 | Disposition: A | Discharge status: Home / Self Care | Payer: BC Managed Care – PPO | Source: Ambulatory Visit | Attending: Physician Assistant | Admitting: Physician Assistant

## 2014-01-30 ENCOUNTER — Emergency Department (HOSPITAL_COMMUNITY)
Admission: EM | Admit: 2014-01-30 | Discharge: 2014-01-30 | Disposition: A | Discharge status: Home / Self Care | Payer: BC Managed Care – PPO | Attending: Emergency Medicine | Admitting: Emergency Medicine

## 2014-01-30 DIAGNOSIS — Z8709 Personal history of other diseases of the respiratory system: Secondary | ICD-10-CM | POA: Insufficient documentation

## 2014-01-30 DIAGNOSIS — R0989 Other specified symptoms and signs involving the circulatory and respiratory systems: Secondary | ICD-10-CM | POA: Diagnosis not present

## 2014-01-30 DIAGNOSIS — Z7952 Long term (current) use of systemic steroids: Secondary | ICD-10-CM | POA: Insufficient documentation

## 2014-01-30 DIAGNOSIS — R011 Cardiac murmur, unspecified: Secondary | ICD-10-CM | POA: Insufficient documentation

## 2014-01-30 DIAGNOSIS — Z792 Long term (current) use of antibiotics: Secondary | ICD-10-CM | POA: Diagnosis not present

## 2014-01-30 DIAGNOSIS — Z720 Tobacco use: Secondary | ICD-10-CM | POA: Diagnosis not present

## 2014-01-30 DIAGNOSIS — R062 Wheezing: Secondary | ICD-10-CM | POA: Insufficient documentation

## 2014-01-30 DIAGNOSIS — Z79899 Other long term (current) drug therapy: Secondary | ICD-10-CM | POA: Diagnosis not present

## 2014-01-30 DIAGNOSIS — R05 Cough: Secondary | ICD-10-CM | POA: Diagnosis not present

## 2014-01-30 DIAGNOSIS — R059 Cough, unspecified: Secondary | ICD-10-CM

## 2014-02-05 ENCOUNTER — Emergency Department (HOSPITAL_COMMUNITY)
Admission: EM | Admit: 2014-02-05 | Discharge: 2014-02-05 | Disposition: A | Discharge status: Home / Self Care | Payer: BC Managed Care – PPO | Attending: Emergency Medicine | Admitting: Emergency Medicine

## 2014-02-05 ENCOUNTER — Encounter (HOSPITAL_COMMUNITY): Payer: Self-pay | Admitting: *Deleted

## 2014-02-05 DIAGNOSIS — R0981 Nasal congestion: Secondary | ICD-10-CM | POA: Diagnosis not present

## 2014-02-05 DIAGNOSIS — Z7982 Long term (current) use of aspirin: Secondary | ICD-10-CM | POA: Insufficient documentation

## 2014-02-05 DIAGNOSIS — Z792 Long term (current) use of antibiotics: Secondary | ICD-10-CM | POA: Insufficient documentation

## 2014-02-05 DIAGNOSIS — J069 Acute upper respiratory infection, unspecified: Secondary | ICD-10-CM | POA: Diagnosis not present

## 2014-02-05 DIAGNOSIS — R51 Headache: Secondary | ICD-10-CM | POA: Insufficient documentation

## 2014-02-05 DIAGNOSIS — Z7952 Long term (current) use of systemic steroids: Secondary | ICD-10-CM | POA: Insufficient documentation

## 2014-02-05 DIAGNOSIS — Z720 Tobacco use: Secondary | ICD-10-CM | POA: Diagnosis not present

## 2014-02-05 DIAGNOSIS — R1111 Vomiting without nausea: Secondary | ICD-10-CM | POA: Diagnosis not present

## 2014-02-05 DIAGNOSIS — R519 Headache, unspecified: Secondary | ICD-10-CM

## 2014-02-05 DIAGNOSIS — R011 Cardiac murmur, unspecified: Secondary | ICD-10-CM | POA: Diagnosis not present

## 2014-02-05 DIAGNOSIS — R040 Epistaxis: Secondary | ICD-10-CM | POA: Diagnosis not present

## 2014-02-05 MED ORDER — OXYMETAZOLINE HCL 0.05 % NA SOLN: 2.0000 | Freq: Two times a day (BID) | NASAL | Status: DC

## 2014-02-05 MED ORDER — ALBUTEROL SULFATE HFA 108 (90 BASE) MCG/ACT IN AERS
2.0000 | INHALATION_SPRAY | Freq: Once | RESPIRATORY_TRACT | Status: AC
  Administered 2014-02-05: 2 via RESPIRATORY_TRACT
  Filled 2014-02-05: qty 6.7

## 2014-02-05 NOTE — ED Notes (Signed)
Bed: DE00 Expected date:  Expected time:  Means of arrival:  Comments: Pt getting dressed.

## 2014-02-05 NOTE — Discharge Instructions (Signed)
Continue to stay well-hydrated. Continue to alternate between Tylenol and Ibuprofen for headaches or fever. Use Mucinex for cough suppression/expectoration of mucus. Use Afrin as directed to help with nasal congestion and prevent further nose bleeds, but DO NOT TAKE FOR LONGER THAN 3 DAYS. May consider over-the-counter Benadryl or other antihistamine to help with symptoms. Use inhaler as directed, as needed for cough/chest congestion. Followup with your primary care doctor in 5-7 days for recheck of ongoing symptoms. Return to emergency department for emergent changing or worsening of symptoms.   Nosebleed A nosebleed can be caused by many things, including:  Getting hit hard in the nose.  Infections.  Dry nose.  Colds.  Medicines. Your doctor may do lab testing if you get nosebleeds a lot and the cause is not known. HOME CARE   If your nose was packed with material, keep it there until your doctor takes it out. Put the pack back in your nose if the pack falls out.  Do not blow your nose for 12 hours after the nosebleed.  Sit up and bend forward if your nose starts bleeding again. Pinch the front half of your nose nonstop for 20 minutes.  Put petroleum jelly inside your nose every morning if you have a dry nose.  Use a humidifier to make the air less dry.  Do not take aspirin.  Try not to strain, lift, or bend at the waist for many days after the nosebleed. GET HELP RIGHT AWAY IF:   Nosebleeds keep happening and are hard to stop or control.  You have bleeding or bruises that are not normal on other parts of the body.  You have a fever.  The nosebleeds get worse.  You get lightheaded, feel faint, sweaty, or throw up (vomit) blood. MAKE SURE YOU:   Understand these instructions.  Will watch your condition.  Will get help right away if you are not doing well or get worse. Document Released: 12/26/2007 Document Revised: 06/10/2011 Document Reviewed: 12/26/2007 [facility]  Patient Information 2015 [place], [place]. This information is not intended to replace advice given to you by your health care provider. Make sure you discuss any questions you have with your health care provider.  Sinus Headache A sinus headache happens when your sinuses become clogged or puffy (swollen). Sinus headaches can be mild or severe. HOME CARE  Take your medicines (antibiotics) as told. Finish them even if you start to feel better.  Only take medicine as told by your doctor.  Use a nose spray if you feel stuffed up (congested). GET HELP RIGHT AWAY IF:  You have a fever.  You have trouble seeing.  You suddenly have pain in your face or head.  You start to twitch or shake (seizure).  You are confused.  You get headaches more than once a week.  Light or sound bothers you.  You feel sick to your stomach (nauseous) or throw up (vomit).  Your headaches do not get better with treatment. MAKE SURE YOU:  Understand these instructions.  Will watch your condition.  Will get help right away if you are not doing well or get worse. Document Released: 07/18/2010 Document Revised: 06/10/2011 Document Reviewed: 07/18/2010 [facility] Patient Information 2015 [place], [place]. This information is not intended to replace advice given to you by your health care provider. Make sure you discuss any questions you have with your health care provider.  Upper Respiratory Infection, Adult An upper respiratory infection (URI) is also known as the common cold. It  is often caused by a type of germ (virus). Colds are easily spread (contagious). You can pass it to others by kissing, coughing, sneezing, or drinking out of the same glass. Usually, you get better in 1 or 2 weeks.  HOME CARE   Only take medicine as told by your doctor.  Use a warm mist humidifier or breathe in steam from a hot shower.  Drink enough water and fluids to keep your pee (urine) clear or pale yellow.  Get plenty of  rest.  Return to work when your temperature is back to normal or as told by your doctor. You may use a face mask and wash your hands to stop your cold from spreading. GET HELP RIGHT AWAY IF:   After the first few days, you feel you are getting worse.  You have questions about your medicine.  You have chills, shortness of breath, or brown or red spit (mucus).  You have yellow or brown snot (nasal discharge) or pain in the face, especially when you bend forward.  You have a fever, puffy (swollen) neck, pain when you swallow, or white spots in the back of your throat.  You have a bad headache, ear pain, sinus pain, or chest pain.  You have a high-pitched whistling sound when you breathe in and out (wheezing).  You have a lasting cough or cough up blood.  You have sore muscles or a stiff neck. MAKE SURE YOU:   Understand these instructions.  Will watch your condition.  Will get help right away if you are not doing well or get worse. Document Released: 09/04/2007 Document Revised: 06/10/2011 Document Reviewed: 06/23/2013 [facility] Patient Information 2015 [place], [place]. This information is not intended to replace advice given to you by your health care provider. Make sure you discuss any questions you have with your health care provider.

## 2014-02-05 NOTE — ED Notes (Signed)
Pt reports she had a nose bleed and then vomited feeling weak in her legs, denies pain.

## 2014-02-05 NOTE — ED Provider Notes (Signed)
CSN: [id number]     Arrival date & time 02/05/14  1359 History   First MD Initiated Contact with Patient 02/05/14 1510     Chief Complaint  Patient presents with  . Emesis  . Epistaxis     (Consider location/radiation/quality/duration/timing/severity/associated sxs/prior Treatment) HPI Comments: Alisha Espinoza is a 48 y.o. female with a PMHx of bronchitis and recent URI/bronchitis undergoing treatment with azithromycin and prednisone (day 2 of tx), who presents to the ED with complaints of 1 episode of R nare epistaxis occurring just PTA lasting less than one minute, which resolved after applying pressure with a tissue. After the nose bleed ceased, she vomited up the food she had just eaten, but has not had any ongoing nausea or vomiting. Pt reports she's had a URI x1.5wks and felt "run down" and "tired" since onset of her symptoms. She had wheezing initially which has subsided since taking azithromycin, prednisone, and using inhalers at home. Her URI is gradually improving, as is her fatigue. With her URI she has had nasal congestion and has been blowing her nose quite frequently, denies picking her nose, and states the R nostril had felt tighter and she had to blow harder. She's also had a 4/10 intermittent generalized nonradiating HA since she has had the nasal congestion, without aggravating or alleviating factors, and denies this being the worst of her life and has not had any thunderclap onset HAs, these are consistent with her prior headaches. She denies fevers, chills, ear pain/drainage, eye pain/discharge, tinnitus, hearing loss, vertigo, syncope, lightheadedness, dizziness, vision changes, sore throat, CP, SOB, wheezing, abd pain, nausea, diarrhea, constipation, urinary changes, paresthesias, myalgias, arthralgias, or focal neuro deficits. She does state she ran out of her inhaler, but that it was helping with her bronchitis symptoms and would like another. Denies taking any blood thinners or  having any bleeding disorders. No prior epistaxis episodes previously.  Patient is a 48 y.o. female presenting with nosebleeds. The history is provided by the patient. No language interpreter was used.  Epistaxis Location:  R nare Severity:  Mild Duration:  1 minute Timing:  Rare Progression:  Resolved Chronicity:  New Context: recent infection (URI)   Relieved by:  Applying pressure Worsened by:  Nothing tried Ineffective treatments:  None tried Associated symptoms: congestion, cough (improving), headaches and sinus pain   Associated symptoms: no blood in oropharynx, no dizziness, no facial pain, no fever, no sneezing, no sore throat and no syncope     Past Medical History  Diagnosis Date  . Bronchitis   . Heart murmur     "closed by age 89."   Past Surgical History  Procedure Laterality Date  . Rotator cuff repair      Right  . Cystectomy      L wrist  . Tympanostomy tube placement    . 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 Cervical fusion  05/07/2012    Dr Rolena Infante  . Anterior cervical decomp/discectomy fusion N/A 05/07/2012    Procedure: ANTERIOR CERVICAL DECOMPRESSION/DISCECTOMY FUSION 1 LEVEL/HARDWARE REMOVAL;  Surgeon: Melina Schools, MD;  Location: [facility];  Service: Orthopedics;  Laterality: N/A;  REMOVAL OF CERVICAL DISC REPLACEMENT AND ACDF C4-5  . Abdominal hysterectomy  2000   Family History  Problem Relation Age of Onset  . Diabetes Mother   . Hypertension  Father    History  Substance Use Topics  . Smoking status: Current Every Day Smoker -- 0.50 packs/day for 20 years    Types: Cigarettes  . Smokeless tobacco: Never Used  . Alcohol Use: Yes     Comment: occasional   OB History    No data available     Review of Systems  Constitutional: Negative  for fever and chills.  HENT: Positive for congestion, nosebleeds and sinus pressure. Negative for ear discharge, ear pain, hearing loss, rhinorrhea, sneezing, sore throat, tinnitus and trouble swallowing.   Eyes: Negative for photophobia, pain, discharge, redness and visual disturbance.  Respiratory: Positive for cough (improving). Negative for chest tightness, shortness of breath and wheezing.   Cardiovascular: Negative for chest pain and syncope.  Gastrointestinal: Negative for nausea, vomiting, abdominal pain, diarrhea and constipation.  Genitourinary: Negative for dysuria, urgency and hematuria.  Musculoskeletal: Negative for myalgias, back pain, joint swelling, arthralgias, neck pain and neck stiffness.  Skin: Negative for color change.  Neurological: Positive for headaches. Negative for dizziness, syncope, weakness, light-headedness and numbness.  Hematological: Does not bruise/bleed easily.  Psychiatric/Behavioral: Negative for confusion.   10 Systems reviewed and are negative for acute change except as noted in the HPI.    Allergies  Meloxicam  Home Medications   Prior to Admission medications   Medication Sig Start Date End Date Taking? Authorizing Provider  aspirin 325 MG tablet Take 325 mg by mouth every 6 (six) hours as needed for mild pain.     Historical Provider, MD  azithromycin (ZITHROMAX) 250 MG tablet Take 1 tablet (250 mg total) by mouth daily. Take first 2 tablets together, then 1 every day until finished. 01/27/14   Everlene Balls, MD  ibuprofen (ADVIL,MOTRIN) 200 MG tablet Take 200 mg by mouth every 6 (six) hours as needed for mild pain.    Historical Provider, MD  predniSONE (DELTASONE) 20 MG tablet Take 3 tablets (60 mg total) by mouth daily. 01/27/14   Everlene Balls, MD   BP 122/70 mmHg  Pulse 98  Temp(Src) 98.2 F (36.8 C) (Oral)  Resp 16  SpO2 98% Physical Exam  Constitutional: She is oriented to person, place, and time. Vital signs are normal. She appears  well-developed and well-nourished.  Non-toxic appearance. No distress.  Afebrile, nontoxic, NAD, VSS  HENT:  Head: Normocephalic and atraumatic.  Right Ear: Hearing, tympanic membrane, external ear and ear canal normal.  Left Ear: Hearing, tympanic membrane, external ear and ear canal normal.  Nose: Mucosal edema present. No rhinorrhea, nose lacerations, sinus tenderness, nasal deformity or nasal septal hematoma. No epistaxis.  No foreign bodies. Right sinus exhibits frontal sinus tenderness. Right sinus exhibits no maxillary sinus tenderness. Left sinus exhibits frontal sinus tenderness. Left sinus exhibits no maxillary sinus tenderness.  Mouth/Throat: Uvula is midline, oropharynx is clear and moist and mucous membranes are normal. No trismus in the jaw. No uvula swelling.  R nasal turbinate edematous and erythematous, no obvious laceration or injury, no hematoma, no ongoing epistaxis, no FB. L turbinate less edematous and mildly erythematous, no injuries or epistaxis. B/L frontal sinus TTP Oropharynx clear and moist, no erythema or exudates, no tonsillar hypertrophy  Eyes: Conjunctivae and EOM are normal. Pupils are equal, round, and reactive to light. Right eye exhibits no discharge. Left eye exhibits no discharge.  Neck: Normal range of motion. Neck supple.  Cardiovascular: Normal rate, regular rhythm, normal heart sounds and intact distal pulses.  Exam reveals no gallop and no friction rub.  No murmur heard. Pulmonary/Chest: Effort normal. No respiratory distress. She has no decreased breath sounds. She has wheezes in the right lower field and the left lower field. She has no rhonchi. She has no rales.  B/L lower field expiratory wheezing, very mild. No increased WOB, SpO2 98% on RA, speaking full sentences  Abdominal: Soft. Normal appearance and bowel sounds are normal. She exhibits no distension. There is no tenderness. There is no rigidity, no rebound, no guarding, no tenderness at  McBurney's point and negative Murphy's sign.  Musculoskeletal: Normal range of motion.  MAE x4  Lymphadenopathy:    She has cervical adenopathy.       Right cervical: Superficial cervical adenopathy present.       Left cervical: Superficial cervical adenopathy present.  Shotty anterior superficial cervical LAD, mildly TTP  Neurological: She is alert and oriented to person, place, and time. She has normal strength. No cranial nerve deficit or sensory deficit. Gait normal.  CN 2-12 grossly intact, sensation grossly intact in all extremities, strength 5/5 in all extremities, gait nonataxic  Skin: Skin is warm, dry and intact. No rash noted.  Psychiatric: She has a normal mood and affect.  Nursing note and vitals reviewed.   ED Course  Procedures (including critical care time) Labs Review Labs Reviewed - No data to display  Imaging Review No results found.   EKG Interpretation None      MDM   Final diagnoses:  Anterior epistaxis  Non-intractable vomiting without nausea, vomiting of unspecified type  URI (upper respiratory infection)  Sinus congestion  Sinus headache    48y/o female with URI symptoms x1.5 wks, having to blow nose often, with one episode of self-resolved epistaxis. Afebrile, nontoxic, VSS, NAD, no further nosebleeds. Emesis likely related to post-nasal drainage of blood irritating abdomen. Pt states she wants to eat now, PO challenged and tolerated food. No s/sx of anemia or acute large-volume blood loss, doubt need for labs. Mild amount of wheezing in lower fields, improved with albuterol inhaler given here, aerosol given to go home with. Discussed use of afrin x2-3 days but no longer than that. This should help with preventing further bleeds, as well as open sinuses up, which should help with her headache. Discussed tylenol or ibuprofen for headaches. Doubt need for further labs/imaging for her symptoms, doubt any other emergent concerns at this time. Will have her  f/up with PCP in 1wk. I explained the diagnosis and have given explicit precautions to return to the ER including for any other new or worsening symptoms. The patient understands and accepts the medical plan as it's been dictated and I have answered their questions. Discharge instructions concerning home care and prescriptions have been given. The patient is STABLE and is discharged to home in good condition.  BP 123/80 mmHg  Pulse 63  Temp(Src) 98.2 F (36.8 C) (Oral)  Resp 16  SpO2 98%  Meds ordered this encounter  Medications  . albuterol (PROVENTIL HFA;VENTOLIN HFA) 108 (90 BASE) MCG/ACT inhaler 2 puff    Sig:   . oxymetazoline (AFRIN NASAL SPRAY) 0.05 % nasal spray    Sig: Place 2 sprays into both nostrils 2 (two) times daily. X 2-3 days. DO NOT TAKE FOR LONGER THAN 3 DAYS!    Dispense:  20 mL    Refill:  0    Order Specific Question:  Supervising Provider    Answer:  Johnna Acosta [address]       Patty Sermons Camprubi-Soms, PA-C 02/05/14 1616  Jasper Riling. Alvino Chapel, MD 02/05/14 [phone]

## 2014-02-07 NOTE — ED Provider Notes (Signed)
CSN: [id number]     Arrival date & time 01/30/14  0111 History   None    No chief complaint on file.    (Consider location/radiation/quality/duration/timing/severity/associated sxs/prior Treatment) HPI   48yF with cough, congestion. Ongoing for past 4-5 days. Recently seen for same and not improving. Occasional sharp CP when coughing. Subjective fever. No unusual leg pain or swelling. No v/d. No sick contacts.   Past Medical History  Diagnosis Date  . Bronchitis   . Heart murmur     "closed by age 67."   Past Surgical History  Procedure Laterality Date  . Rotator cuff repair      Right  . Cystectomy      L wrist  . Tympanostomy tube placement    . 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 Cervical fusion  05/07/2012    Dr Rolena Infante  . Anterior cervical decomp/discectomy fusion N/A 05/07/2012    Procedure: ANTERIOR CERVICAL DECOMPRESSION/DISCECTOMY FUSION 1 LEVEL/HARDWARE REMOVAL;  Surgeon: Melina Schools, MD;  Location: [facility];  Service: Orthopedics;  Laterality: N/A;  REMOVAL OF CERVICAL DISC REPLACEMENT AND ACDF C4-5  . Abdominal hysterectomy  2000   Family History  Problem Relation Age of Onset  . Diabetes Mother   . Hypertension Father    History  Substance Use Topics  . Smoking status: Current Every Day Smoker -- 0.50 packs/day for 20 years    Types: Cigarettes  . Smokeless tobacco: Never Used  . Alcohol Use: Yes     Comment: occasional   OB History    No data available     Review of Systems  All systems reviewed and negative, other than as noted in HPI.   Allergies  Meloxicam  Home Medications   Prior to Admission medications   Medication Sig Start Date End Date Taking? Authorizing Provider  Ascorbic Acid (VITAMIN C PO) Take 2 tablets by mouth daily.     Historical Provider, MD  aspirin 325 MG tablet Take 325 mg by mouth every 6 (six) hours as needed for mild pain.     Historical Provider, MD  azithromycin (ZITHROMAX) 250 MG tablet Take 1 tablet (250 mg total) by mouth daily. Take first 2 tablets together, then 1 every day until finished. 01/27/14   Everlene Balls, MD  ibuprofen (ADVIL,MOTRIN) 200 MG tablet Take 200 mg by mouth every 6 (six) hours as needed for mild pain (pain).     Historical Provider, MD  oxymetazoline (AFRIN NASAL SPRAY) 0.05 % nasal spray Place 2 sprays into both nostrils 2 (two) times daily. X 2-3 days. DO NOT TAKE FOR LONGER THAN 3 DAYS! 02/05/14   Patty Sermons Camprubi-Soms, PA-C  predniSONE (DELTASONE) 20 MG tablet Take 3 tablets (60 mg total) by mouth daily. 01/27/14   Everlene Balls, MD   There were no vitals taken for this visit. Physical Exam  Constitutional: She appears well-developed and well-nourished. No distress.  HENT:  Head: Normocephalic and atraumatic.  Eyes: Conjunctivae are normal. Right eye exhibits no discharge. Left eye exhibits no discharge.  Neck: Neck supple.  Cardiovascular: Normal rate, regular rhythm and normal heart sounds.  Exam reveals no gallop and no friction rub.   No murmur heard. Pulmonary/Chest: Effort normal. She has wheezes.  B/l expiratory wheezing and scattered R sided rhonchi  Abdominal: Soft. She exhibits no  distension. There is no tenderness.  Musculoskeletal: She exhibits no edema or tenderness.  Lower extremities symmetric as compared to each other. No calf tenderness. Negative Homan's. No palpable cords.   Neurological: She is alert.  Skin: Skin is warm and dry.  Psychiatric: She has a normal mood and affect. Her behavior is normal. Thought content normal.  Nursing note and vitals reviewed.   ED Course  Procedures (including critical care time) Labs Review Labs Reviewed - No data to display  Imaging Review No results found.   EKG Interpretation None      MDM    Final diagnoses:  Coughing  Wheezing  Rhonchi    48yF with cough. CXR consistent with pneumonia. I feel appropriate for outpt tx. Return precautions discussed.     Virgel Manifold, MD 02/07/14 1208

## 2014-02-24 ENCOUNTER — Emergency Department (HOSPITAL_COMMUNITY): Payer: BC Managed Care – PPO

## 2014-02-24 ENCOUNTER — Encounter (HOSPITAL_COMMUNITY): Payer: Self-pay | Admitting: Emergency Medicine

## 2014-02-24 ENCOUNTER — Emergency Department (HOSPITAL_COMMUNITY)
Admission: EM | Admit: 2014-02-24 | Discharge: 2014-02-24 | Disposition: A | Discharge status: Home / Self Care | Payer: BC Managed Care – PPO | Attending: Emergency Medicine | Admitting: Emergency Medicine

## 2014-02-24 DIAGNOSIS — J159 Unspecified bacterial pneumonia: Secondary | ICD-10-CM | POA: Diagnosis not present

## 2014-02-24 DIAGNOSIS — Z7952 Long term (current) use of systemic steroids: Secondary | ICD-10-CM | POA: Insufficient documentation

## 2014-02-24 DIAGNOSIS — R011 Cardiac murmur, unspecified: Secondary | ICD-10-CM | POA: Insufficient documentation

## 2014-02-24 DIAGNOSIS — R062 Wheezing: Secondary | ICD-10-CM

## 2014-02-24 DIAGNOSIS — Z7982 Long term (current) use of aspirin: Secondary | ICD-10-CM | POA: Diagnosis not present

## 2014-02-24 DIAGNOSIS — Z720 Tobacco use: Secondary | ICD-10-CM | POA: Insufficient documentation

## 2014-02-24 DIAGNOSIS — Z79899 Other long term (current) drug therapy: Secondary | ICD-10-CM | POA: Diagnosis not present

## 2014-02-24 DIAGNOSIS — R05 Cough: Secondary | ICD-10-CM

## 2014-02-24 DIAGNOSIS — R059 Cough, unspecified: Secondary | ICD-10-CM

## 2014-02-24 DIAGNOSIS — J189 Pneumonia, unspecified organism: Secondary | ICD-10-CM

## 2014-02-24 LAB — CBC
HCT: 42.1 % (ref 36.0–46.0)
Hemoglobin: 14 g/dL (ref 12.0–15.0)
MCH: 27.3 pg (ref 26.0–34.0)
MCHC: 33.3 g/dL (ref 30.0–36.0)
MCV: 82.1 fL (ref 78.0–100.0)
Platelets: 216 10*3/uL (ref 150–400)
RBC: 5.13 MIL/uL — ABNORMAL HIGH (ref 3.87–5.11)
RDW: 15.9 % — ABNORMAL HIGH (ref 11.5–15.5)
WBC: 8.5 10*3/uL (ref 4.0–10.5)

## 2014-02-24 LAB — BASIC METABOLIC PANEL
Anion gap: 13 (ref 5–15)
BUN: 10 mg/dL (ref 6–23)
CO2: 22 mEq/L (ref 19–32)
Calcium: 9.5 mg/dL (ref 8.4–10.5)
Chloride: 104 mEq/L (ref 96–112)
Creatinine, Ser: 0.67 mg/dL (ref 0.50–1.10)
GFR calc Af Amer: >90 mL/min (ref >90)
GFR calc non Af Amer: >90 mL/min (ref >90)
Glucose, Bld: 118 mg/dL — ABNORMAL HIGH (ref 70–99)
Potassium: 4.2 mEq/L (ref 3.7–5.3)
Sodium: 139 mEq/L (ref 137–147)

## 2014-02-24 MED ORDER — PREDNISONE 20 MG PO TABS: 60.0000 mg | ORAL_TABLET | Freq: Once | ORAL | Status: DC

## 2014-02-24 MED ORDER — ALBUTEROL (5 MG/ML) CONTINUOUS INHALATION SOLN
10.0000 mg/h | INHALATION_SOLUTION | Freq: Once | RESPIRATORY_TRACT | Status: AC
  Administered 2014-02-24: 10 mg/h via RESPIRATORY_TRACT
  Filled 2014-02-24: qty 20

## 2014-02-24 MED ORDER — HYDROCOD POLST-CHLORPHEN POLST 10-8 MG/5ML PO LQCR
5.0000 mL | Freq: Once | ORAL | Status: AC
  Administered 2014-02-24: 5 mL via ORAL
  Filled 2014-02-24: qty 5

## 2014-02-24 MED ORDER — ALBUTEROL SULFATE HFA 108 (90 BASE) MCG/ACT IN AERS
1.0000 | INHALATION_SPRAY | Freq: Once | RESPIRATORY_TRACT | Status: AC
  Administered 2014-02-24: 2 via RESPIRATORY_TRACT
  Filled 2014-02-24: qty 6.7

## 2014-02-24 MED ORDER — ALBUTEROL SULFATE HFA 108 (90 BASE) MCG/ACT IN AERS: 1.0000 | INHALATION_SPRAY | Freq: Four times a day (QID) | RESPIRATORY_TRACT | Status: DC | PRN

## 2014-02-24 MED ORDER — DEXTROSE 5 % IV SOLN
500.0000 mg | Freq: Once | INTRAVENOUS | Status: AC
  Administered 2014-02-24: 500 mg via INTRAVENOUS
  Filled 2014-02-24: qty 500

## 2014-02-24 MED ORDER — PREDNISONE 20 MG PO TABS
60.0000 mg | ORAL_TABLET | Freq: Once | ORAL | Status: AC
  Administered 2014-02-24: 60 mg via ORAL
  Filled 2014-02-24: qty 3

## 2014-02-24 MED ORDER — DEXTROSE 5 % IV SOLN
1.0000 g | Freq: Once | INTRAVENOUS | Status: AC
  Administered 2014-02-24: 1 g via INTRAVENOUS
  Filled 2014-02-24: qty 10

## 2014-02-24 MED ORDER — IPRATROPIUM BROMIDE 0.02 % IN SOLN
0.5000 mg | Freq: Once | RESPIRATORY_TRACT | Status: AC
  Administered 2014-02-24: 0.5 mg via RESPIRATORY_TRACT
  Filled 2014-02-24: qty 2.5

## 2014-02-24 MED ORDER — AZITHROMYCIN 250 MG PO TABS: 250.0000 mg | ORAL_TABLET | Freq: Every day | ORAL | Status: DC

## 2014-02-24 NOTE — ED Notes (Signed)
Patient transported to X-ray 

## 2014-02-24 NOTE — Discharge Instructions (Signed)
Pneumonia °Pneumonia is an infection of the lungs.  °CAUSES °Pneumonia may be caused by bacteria or a virus. Usually, these infections are caused by breathing infectious particles into the lungs (respiratory tract). °SIGNS AND SYMPTOMS  °· Cough. °· Fever. °· Chest pain. °· Increased rate of breathing. °· Wheezing. °· Mucus production. °DIAGNOSIS  °If you have the common symptoms of pneumonia, your health care provider will typically confirm the diagnosis with a chest X-ray. The X-ray will show an abnormality in the lung (pulmonary infiltrate) if you have pneumonia. Other tests of your blood, urine, or sputum may be done to find the specific cause of your pneumonia. Your health care provider may also do tests (blood gases or pulse oximetry) to see how well your lungs are working. °TREATMENT  °Some forms of pneumonia may be spread to other people when you cough or sneeze. You may be asked to wear a mask before and during your exam. Pneumonia that is caused by bacteria is treated with antibiotic medicine. Pneumonia that is caused by the influenza virus may be treated with an antiviral medicine. Most other viral infections must run their course. These infections will not respond to antibiotics.  °HOME CARE INSTRUCTIONS  °· Cough suppressants may be used if you are losing too much rest. However, coughing protects you by clearing your lungs. You should avoid using cough suppressants if you can. °· Your health care provider may have prescribed medicine if he or she thinks your pneumonia is caused by bacteria or influenza. Finish your medicine even if you start to feel better. °· Your health care provider may also prescribe an expectorant. This loosens the mucus to be coughed up. °· Take medicines only as directed by your health care provider. °· Do not smoke. Smoking is a common cause of bronchitis and can contribute to pneumonia. If you are a smoker and continue to smoke, your cough may last several weeks after your  pneumonia has cleared. °· A cold steam vaporizer or humidifier in your room or home may help loosen mucus. °· Coughing is often worse at night. Sleeping in a semi-upright position in a recliner or using a couple pillows under your head will help with this. °· Get rest as you feel it is needed. Your body will usually let you know when you need to rest. °PREVENTION °A pneumococcal shot (vaccine) is available to prevent a common bacterial cause of pneumonia. This is usually suggested for: °· People over 65 years old. °· Patients on chemotherapy. °· People with chronic lung problems, such as bronchitis or emphysema. °· People with immune system problems. °If you are over 65 or have a high risk condition, you may receive the pneumococcal vaccine if you have not received it before. In some countries, a routine influenza vaccine is also recommended. This vaccine can help prevent some cases of pneumonia. You may be offered the influenza vaccine as part of your care. °If you smoke, it is time to quit. You may receive instructions on how to stop smoking. Your health care provider can provide medicines and counseling to help you quit. °SEEK MEDICAL CARE IF: °You have a fever. °SEEK IMMEDIATE MEDICAL CARE IF:  °· Your illness becomes worse. This is especially true if you are elderly or weakened from any other disease. °· You cannot control your cough with suppressants and are losing sleep. °· You begin coughing up blood. °· You develop pain which is getting worse or is uncontrolled with medicines. °· Any of the symptoms   which initially brought you in for treatment are getting worse rather than better. °· You develop shortness of breath or chest pain. °MAKE SURE YOU:  °· Understand these instructions. °· Will watch your condition. °· Will get help right away if you are not doing well or get worse. °Document Released: 03/18/2005 Document Revised: 08/02/2013 Document Reviewed: 06/07/2010 °ExitCare® Patient Information ©2015  [employer]. This information is not intended to replace advice given to you by your health care provider. Make sure you discuss any questions you have with your health care provider. ° ° °Emergency Department Resource Guide °1) Find a Doctor and Pay Out of Pocket °Although you won't have to find out who is covered by your insurance plan, it is a good idea to ask around and get recommendations. You will then need to call the office and see if the doctor you have chosen will accept you as a new patient and what types of options they offer for patients who are self-pay. Some doctors offer discounts or will set up payment plans for their patients who do not have insurance, but you will need to ask so you aren't surprised when you get to your appointment. ° °2) Contact Your Local Health Department °Not all health departments have doctors that can see patients for sick visits, but many do, so it is worth a call to see if yours does. If you don't know where your local health department is, you can check in your phone book. The CDC also has a tool to help you locate your state's health department, and many state websites also have listings of all of their local health departments. ° °3) Find a Walk-in Clinic °If your illness is not likely to be very severe or complicated, you may want to try a walk in clinic. These are popping up all over the country in pharmacies, drugstores, and shopping centers. They're usually staffed by nurse practitioners or physician assistants that have been trained to treat common illnesses and complaints. They're usually fairly quick and inexpensive. However, if you have serious medical issues or chronic medical problems, these are probably not your best option. ° °No Primary Care Doctor: °- Call [facility] at  832-8000 - they can help you locate a primary care doctor that  accepts your insurance, provides certain services, etc. °- Physician Referral Service- 1-800-533-3463 ° °Chronic Pain  Problems: °Organization         Address  Phone   Notes  °[facility]  (336) 297-2271 Patients need to be referred by their primary care doctor.  ° °Medication Assistance: °Organization         Address  Phone   Notes  °[place] [facility] [address] °[place], [place] [address] (336) 641-8030 --Must be a resident of [place] °-- Must have NO insurance coverage whatsoever (no Medicaid/ Medicare, etc.) °-- The pt. MUST have a primary care doctor that directs their care regularly and follows them in the community °  °[facility]  (866) 331-1348   °[employer]  (888) 892-1162   ° °Agencies that provide inexpensive medical care: °Organization         Address  Phone   Notes  °[facility]  (336) 832-8035   °[facility]    (336) 832-7272   °[facility] [address] °[place], [place] [address] (336) 832-4777   °[facility] [address], °[place] (336) 271-4999   °  [facility]    (336) 373-0678   °[facility]    (336) 272-1050   °[facility] ° [address], [place] Phone:  (336) 832-4444, Fax:  (336) 832-4440 Hours of Operation:  9 am - 6 pm, M-F.  Also accepts Medicaid/Medicare and self-pay.  °[facility] ° [address], [place] Phone: (336) 832-3150, Fax: (336) 832-3151. Hours of Operation:  8:30 am - 5:30 pm, M-F.  Also accepts Medicaid and self-pay.  °[facility] [place] [address], [place] Phone: (336) 878-6027   °[facility] [address], [place], [place] (336)723-1848, Ext. 123 Mondays & Thursdays: 7-9 AM.  First 15 patients are seen on a first come, first serve basis. °  ° °Medicaid-accepting [place] Providers: ° °Organization         Address  Phone   Notes  °[facility] [address], [place] (336) 641-2100 Also  accepts self-pay patients.  °[facility] [address], [place] ° (336) 856-9996   °[facility] [address], [place] (336) 288-8857   °[facility] [address] [place] [address], [place] (336) 299-7000   °Veita Bland [address], [place]  ° (336) 373-1557 Only accepts [place] Access Medicaid patients after they have their name applied to their card.  ° °Self-Pay (no insurance) in [place]: ° °Organization         Address  Phone   Notes  °Sickle Cell Patients, [facility] [address], [place] (336) 832-1970   °[facility] [address], [place] (336) 832-4400   °[facility] ° [address], [place] (336) 992-4800   °[facility]/Dr. Osei-Bonsu ° [address] [place] [address], [place] or [address], [place] (336) 841-8500 Phone number for both [place] and [place] locations is the same.  °[facility] [address], [place] (336) 299-0000   °[facility] [address] [place] [address], [place] or [address] (336) 852-7530 °(336) 878-2260   °[facility] [address], [place] (336) 350-1642, phone; (336) 294-5005, fax Sees patients 1st and 3rd Saturday of every month.  Must not qualify for public or private insurance (i.e. Medicaid, Medicare, [place] Health Choice, Veterans' Benefits) • Household income should be no more than 200% of the poverty level •The clinic cannot treat you if you are pregnant or think you are pregnant • Sexually transmitted diseases are not treated at the clinic.  ° ° °Dental Care: °Organization         Address  Phone  Notes  °[facility] [address],  (336) 641-6152 Accepts children up to age 21 who are enrolled in Medicaid or [place] Health Choice; pregnant  women with a Medicaid card; and children who have applied for Medicaid or [place] Health Choice, but were declined, whose parents can pay a reduced fee at time of service.  °[facility] [place]  [address], [place] (336) 641-7733 Accepts children up to age 21 who are enrolled in Medicaid or [place] Health Choice; pregnant women with a Medicaid card; and children who have applied for Medicaid or [place] Health Choice, but were declined,   whose parents can pay a reduced fee at time of service.  °[facility] ° [address], [place] (336) 641-4533 Patients are seen by appointment only. Walk-ins are not accepted. [facility] will see patients 18 years of age and older. °Monday - Tuesday (8am-5pm) °Most Wednesdays (8:30-5pm) °$30 per visit, cash only  °[facility] ° [address], [place] (336) 641-4533 Patients are seen by appointment only. Walk-ins are not accepted. [facility] will see patients 18 years of age and older. °One Wednesday Evening (Monthly: Volunteer Based).  $30 per visit, cash only  °[facility]  (919) 537-3737 for adults; Children under age 4, call Graduate Pediatric Dentistry at (919) [phone]. Children aged 4-14, please call (919) [phone] to request a pediatric application. ° Dental services are provided in all areas of dental care including fillings, crowns and bridges, complete and partial dentures, implants, gum treatment, root canals, and extractions. Preventive care is also provided. Treatment is provided to both adults and children. °Patients are selected via a lottery and there is often a waiting list. °  °[facility] [address], °[place] ° (336) 763-8833 [email or web address] °  °[facility] [address], [place], [place] (336)723-1848, Ext. 123 Second and Fourth Thursday of each month, opens at 6:30 AM; Clinic ends at 9 AM.  Patients are  seen on a first-come first-served basis, and a limited number are seen during each clinic.  ° °[facility] ° [address], [place], [place] (336) 723-7904   Eligibility Requirements °You must have lived in [place], [place], or [place] counties for at least the last three months. °  You cannot be eligible for state or federal sponsored healthcare insurance, including Veterans Administration, Medicaid, or Medicare. °  You generally cannot be eligible for healthcare insurance through your employer.  °  How to apply: °Eligibility screenings are held every Tuesday and Wednesday afternoon from 1:00 pm until 4:00 pm. You do not need an appointment for the interview!  °[facility] [address], [place], [place] 336-631-2330   °[facility]  336-342-8273   °[facility]  336-703-3100   °[facility]  336-570-6415   ° °Behavioral Health Resources in the Community: °Intensive Outpatient Programs °Organization         Address  Phone  Notes  °[facility] [address], [place], [place] 336-878-6098   °[facility] [address], [place], [place] 336-832-9800   °[facility] [address], [place],  ° 336-882-2125   °[facility] [address],  °[place],  1-800-853-5163 or 336-641-4981   °Substance Abuse Resources °Organization         Address  Phone  Notes  °[facility]  336-882-2125   °[facility]  336-784-9470   °[facility]  336-285-9073   °[facility]  336-845-3988   °Residential & Outpatient Substance Abuse Program  1-800-659-3381   °Psychological Services °Organization         Address  Phone  Notes  °[facility]  336- 832-9600   °[facility]  336- 378-7881   °[facility] [address],  1-800-853-5163 or 336-641-4981   ° °Mobile Crisis  Teams °Organization         Address  Phone  Notes  °[facility]   Crisis Care Unit  1-877-626-1772   °Assertive °[facility] ° [address] [place], [place] 336-834-9664   °[name] DeEsch [address] °[place] 336-554-5454   ° °Self-Help/Support Groups °Organization         Address  Phone             Notes  °[facility] [place] - variety of support groups  336- 373-1402 Call for more information  °Narcotics Anonymous (NA), [facility] [address], °[place]  2 meetings at this location  ° °Residential Treatment Programs °Organization         Address  Phone  Notes  °[facility] [address],    °[place]  1-866-801-8205   °[facility] ° [address], [place], [place] 704-293-8524   °[facility] [address], [place] 336-845-3988 Admissions: 8am-3pm M-F  °[facility] [address]    °[place], [place] 336-841-1104   °[facility] [address], [place], [place] 336-379-7146   °[facility] [address]  °[place], [place] 336-285-9073   °[facility] Intensive Outpatient [address], [place], [place] 336-852-3033   °[facility] [address]  °[place], [place] 1-877-615-2722 or 336-784-9470   °[facility] [address] [place], [place] 336-227-7417 Accepts Medicaid  °[facility] [address]  °[place] 1-800-659-3381 Substance Abuse/Addiction Treatment  ° °[place] Behavioral Health Resources °Organization         Address  Phone  Notes  °[facility]  (888) 581-9988   °Julie Brannon, PhD [address] [place], [place]   (336) 349-5553 or (336) 951-0000   °[facility]   [address] °[place], [place] (336) 349-4454   °[facility] [address], [place], [place] (336) 342-8316  Insurance/Medicaid/sponsorship through [facility]  °[facility] [address]                                    [place], [place] (336) 342-8316 Therapy/tele-psych/case  °[facility] [address]  ° [place], [place] (336) 349-2233    °Dr. Arfeen  (336) 349-4544   °[facility] [place]  [employer] [place] [facility] 1) [address], [place] °2) [address], [place] °3)  [address], [place] (336) 349-3220 °(336) 342-7768 ° °(336) 342-8140   °[place] [facility] (336) 342-1394 or (336) 342-3537 (After Hours)    ° ° ° °

## 2014-02-24 NOTE — ED Provider Notes (Signed)
CSN: [id number]     Arrival date & time 02/24/14  0128 History   First MD Initiated Contact with Patient 02/24/14 0228     Chief Complaint  Patient presents with  . Cough     (Consider location/radiation/quality/duration/timing/severity/associated sxs/prior Treatment) Patient is a 48 y.o. female presenting with cough. The history is provided by the patient.  Cough Cough characteristics:  Productive Sputum characteristics:  Yellow Severity:  Moderate Onset quality:  Gradual Timing:  Constant Progression:  Unchanged Chronicity:  New Smoker: no   Context: not occupational exposure, not sick contacts, not upper respiratory infection and not weather changes   Relieved by:  None tried Associated symptoms: shortness of breath   Associated symptoms: no chest pain, no fever and no wheezing     Past Medical History  Diagnosis Date  . Bronchitis   . Heart murmur     "closed by age 75."   Past Surgical History  Procedure Laterality Date  . Rotator cuff repair      Right  . Cystectomy      L wrist  . Tympanostomy tube placement    . Anterior cervical decomp/discectomy fusion  03/19/2012    Procedure: ANTERIOR CERVICAL DECOMPRESSION/DISCECTOMY FUSION 1 LEVEL;  Surgeon: Melina Schools, MD;  Location: [facility];  Service: Orthopedics;  Laterality: Left;  Total Disc Replacement C4-5  . Cervical fusion  05/07/2012    Dr Rolena Infante  . Anterior cervical decomp/discectomy fusion N/A 05/07/2012    Procedure: ANTERIOR CERVICAL DECOMPRESSION/DISCECTOMY FUSION 1 LEVEL/HARDWARE REMOVAL;  Surgeon: Melina Schools, MD;  Location: [facility];  Service: Orthopedics;  Laterality: N/A;  REMOVAL OF CERVICAL DISC REPLACEMENT AND ACDF C4-5  . Abdominal hysterectomy  2000   Family History  Problem Relation Age of Onset  . Diabetes Mother   . Hypertension Father    History  Substance Use Topics  . Smoking status: Current Every Day Smoker -- 0.50 packs/day for 20 years    Types: Cigarettes  . Smokeless tobacco:  Never Used  . Alcohol Use: Yes     Comment: occasional   OB History    No data available     Review of Systems  Constitutional: Negative for fever.  Respiratory: Positive for cough, chest tightness and shortness of breath. Negative for wheezing.   Cardiovascular: Negative for chest pain and leg swelling.  Gastrointestinal: Negative for vomiting and abdominal pain.  All other systems reviewed and are negative.     Allergies  Meloxicam  Home Medications   Prior to Admission medications   Medication Sig Start Date End Date Taking? Authorizing Provider  Ascorbic Acid (VITAMIN C PO) Take 2 tablets by mouth daily.   Yes Historical Provider, MD  aspirin 325 MG tablet Take 325 mg by mouth every 6 (six) hours as needed for mild pain.    Yes Historical Provider, MD  azithromycin (ZITHROMAX) 250 MG tablet Take 1 tablet (250 mg total) by mouth daily. Take first 2 tablets together, then 1 every day until finished. Patient not taking: Reported on 02/24/2014 01/27/14   Everlene Balls, MD  oxymetazoline (AFRIN NASAL SPRAY) 0.05 % nasal spray Place 2 sprays into both nostrils 2 (two) times daily. X 2-3 days. DO NOT TAKE FOR LONGER THAN 3 DAYS! Patient not taking: Reported on 02/24/2014 02/05/14   Patty Sermons Camprubi-Soms, PA-C  predniSONE (DELTASONE) 20 MG tablet Take 3 tablets (60 mg total) by mouth daily. 01/27/14   Everlene Balls, MD   BP 160/100 mmHg  Pulse 79  Temp(Src)  98.2 F (36.8 C) (Oral)  Resp 20  SpO2 100% Physical Exam  Constitutional: She is oriented to person, place, and time. She appears well-developed and well-nourished. No distress.  HENT:  Head: Normocephalic and atraumatic.  Mouth/Throat: Oropharynx is clear and moist.  Eyes: EOM are normal. Pupils are equal, round, and reactive to light.  Neck: Normal range of motion. Neck supple.  Cardiovascular: Normal rate and regular rhythm.  Exam reveals no friction rub.   No murmur heard. Pulmonary/Chest: Effort normal. No  respiratory distress. She has decreased breath sounds. She has wheezes (moderate, diffuse). She has no rales.  Abdominal: Soft. She exhibits no distension. There is no tenderness. There is no rebound.  Musculoskeletal: Normal range of motion. She exhibits no edema.  Neurological: She is alert and oriented to person, place, and time. No cranial nerve deficit. She exhibits normal muscle tone. Coordination normal.  Skin: No rash noted. She is not diaphoretic.  Nursing note and vitals reviewed.   ED Course  Procedures (including critical care time) Labs Review Labs Reviewed  CBC - Abnormal; Notable for the following:    RBC 5.13 (*)    RDW 15.9 (*)    All other components within normal limits  BASIC METABOLIC PANEL    Imaging Review Dg Chest 2 View  02/24/2014   CLINICAL DATA:  Initial evaluation for fever, congestion and cough.  EXAM: CHEST  2 VIEW  COMPARISON:  Prior radiograph from 01/30/2014  FINDINGS: Cardiac and mediastinal silhouettes are within normal limits.  Lungs are normally inflated. There is diffuse peribronchial thickening, suggesting bronchiolitis in the setting of cough and fever. Hazy left perihilar opacity again noted, which may reflect superimposed bronchopneumonia. Node other definite focal infiltrates. No pulmonary edema or pleural effusion. No pneumothorax.  ACDF overlies cervical spine.  No acute osseus abnormality.  IMPRESSION: Diffuse peribronchial thickening, suggesting bronchiolitis in the setting of cough and fever. More confluent and hazy left perihilar opacity may reflect superimposed bronchopneumonia, similar to prior study.   Electronically Signed   By: Jeannine Boga M.D.   On: 02/24/2014 03:18     EKG Interpretation None      MDM   Final diagnoses:  Cough  Community acquired pneumonia  Wheezing    69F here with cough, wheezing for past several days. Smoker, no hx of COPD. Cough productive. Here vitals stable. Lungs with diffuse wheezing,  decreased air movement. Plan for steroids, albuterol. CXR shows pneumonia, given rocephin/azithromycin. No white count, BMP ok. Feeling better. Sleeping comfortably with stable vitals. Given steroids, albuterol, azithromycin. Stable for discharge.    Evelina Bucy, MD 02/24/14 [phone]

## 2014-02-24 NOTE — ED Notes (Signed)
Pt states she was seen a week ago for same and then went back to work and got sick again  Pt states she has a cough, wheezing, congestion, body aches, headache, and neck pain

## 2014-08-19 ENCOUNTER — Encounter (HOSPITAL_COMMUNITY): Payer: Self-pay | Admitting: Emergency Medicine

## 2014-08-19 ENCOUNTER — Emergency Department (HOSPITAL_COMMUNITY)
Admission: EM | Admit: 2014-08-19 | Discharge: 2014-08-19 | Disposition: A | Discharge status: Home / Self Care | Payer: BLUE CROSS/BLUE SHIELD | Attending: Emergency Medicine | Admitting: Emergency Medicine

## 2014-08-19 DIAGNOSIS — M791 Myalgia, unspecified site: Secondary | ICD-10-CM

## 2014-08-19 DIAGNOSIS — Z79899 Other long term (current) drug therapy: Secondary | ICD-10-CM | POA: Insufficient documentation

## 2014-08-19 DIAGNOSIS — G5601 Carpal tunnel syndrome, right upper limb: Secondary | ICD-10-CM

## 2014-08-19 DIAGNOSIS — Z7982 Long term (current) use of aspirin: Secondary | ICD-10-CM | POA: Insufficient documentation

## 2014-08-19 DIAGNOSIS — R200 Anesthesia of skin: Secondary | ICD-10-CM | POA: Diagnosis present

## 2014-08-19 DIAGNOSIS — R011 Cardiac murmur, unspecified: Secondary | ICD-10-CM | POA: Insufficient documentation

## 2014-08-19 DIAGNOSIS — Z720 Tobacco use: Secondary | ICD-10-CM | POA: Diagnosis not present

## 2014-08-19 DIAGNOSIS — Z8709 Personal history of other diseases of the respiratory system: Secondary | ICD-10-CM | POA: Diagnosis not present

## 2014-08-19 LAB — I-STAT CHEM 8, ED
BUN: 23 mg/dL — ABNORMAL HIGH (ref 6–20)
Calcium, Ion: 1.28 mmol/L — ABNORMAL HIGH (ref 1.12–1.23)
Chloride: 107 mmol/L (ref 101–111)
Creatinine, Ser: 0.8 mg/dL (ref 0.44–1.00)
Glucose, Bld: 97 mg/dL (ref 65–99)
HCT: 53 % — ABNORMAL HIGH (ref 36.0–46.0)
Hemoglobin: 18 g/dL — ABNORMAL HIGH (ref 12.0–15.0)
Potassium: 3.7 mmol/L (ref 3.5–5.1)
Sodium: 144 mmol/L (ref 135–145)
TCO2: 24 mmol/L (ref 0–100)

## 2014-08-19 MED ORDER — METHOCARBAMOL 500 MG PO TABS: 1000.0000 mg | ORAL_TABLET | Freq: Three times a day (TID) | ORAL | Status: DC | PRN

## 2014-08-19 MED ORDER — IBUPROFEN 600 MG PO TABS: 600.0000 mg | ORAL_TABLET | Freq: Four times a day (QID) | ORAL | Status: DC | PRN

## 2014-08-19 NOTE — Discharge Instructions (Signed)
Carpal Tunnel Syndrome The carpal tunnel is a narrow area located on the palm side of your wrist. The tunnel is formed by the wrist bones and ligaments. Nerves, blood vessels, and tendons pass through the carpal tunnel. Repeated wrist motion or certain diseases may cause swelling within the tunnel. This swelling pinches the main nerve in the wrist (median nerve) and causes the painful hand and arm condition called carpal tunnel syndrome. CAUSES   Repeated wrist motions.  Wrist injuries.  Certain diseases like arthritis, diabetes, alcoholism, hyperthyroidism, and kidney failure.  Obesity.  Pregnancy. SYMPTOMS   A "pins and needles" feeling in your fingers or hand, especially in your thumb, index and middle fingers.  Tingling or numbness in your fingers or hand.  An aching feeling in your entire arm, especially when your wrist and elbow are bent for long periods of time.  Wrist pain that goes up your arm to your shoulder.  Pain that goes down into your palm or fingers.  A weak feeling in your hands. DIAGNOSIS  Your health care provider will take your history and perform a physical exam. An electromyography test may be needed. This test measures electrical signals sent out by your nerves into the muscles. The electrical signals are usually slowed by carpal tunnel syndrome. You may also need X-rays. TREATMENT  Carpal tunnel syndrome may clear up by itself. Your health care provider may recommend a wrist splint or medicine such as a nonsteroidal anti-inflammatory medicine. Cortisone injections may help. Sometimes, surgery may be needed to free the pinched nerve.  HOME CARE INSTRUCTIONS   Take all medicine as directed by your health care provider. Only take over-the-counter or prescription medicines for pain, discomfort, or fever as directed by your health care provider.  If you were given a splint to keep your wrist from bending, wear it as directed. It is important to wear the splint at  night. Wear the splint for as long as you have pain or numbness in your hand, arm, or wrist. This may take 1 to 2 months.  Rest your wrist from any activity that may be causing your pain. If your symptoms are work-related, you may need to talk to your employer about changing to a job that does not require using your wrist.  Put ice on your wrist after long periods of wrist activity.  Put ice in a plastic bag.  Place a towel between your skin and the bag.  Leave the ice on for 15-20 minutes, 03-04 times a day.  Keep all follow-up visits as directed by your health care provider. This includes any orthopedic referrals, physical therapy, and rehabilitation. Any delay in getting necessary care could result in a delay or failure of your condition to heal. SEEK IMMEDIATE MEDICAL CARE IF:   You have new, unexplained symptoms.  Your symptoms get worse and are not helped or controlled with medicines. MAKE SURE YOU:   Understand these instructions.  Will watch your condition.  Will get help right away if you are not doing well or get worse. Document Released: 03/15/2000 Document Revised: 08/02/2013 Document Reviewed: 02/01/2011 [facility] Patient Information 2015 [place], [place]. This information is not intended to replace advice given to you by your health care provider. Make sure you discuss any questions you have with your health care provider.  Muscle Pain Muscle pain (myalgia) may be caused by many things, including:  Overuse or muscle strain, especially if you are not in shape. This is the most common cause of muscle  pain.  Injury.  Bruises.  Viruses, such as the flu.  Infectious diseases.  Fibromyalgia, which is a chronic condition that causes muscle tenderness, fatigue, and headache.  Autoimmune diseases, including lupus.  Certain drugs, including ACE inhibitors and statins. Muscle pain may be mild or severe. In most cases, the pain lasts only a short time and goes away  without treatment. To diagnose the cause of your muscle pain, your health care provider will take your medical history. This means he or she will ask you when your muscle pain began and what has been happening. If you have not had muscle pain for very long, your health care provider may want to wait before doing much testing. If your muscle pain has lasted a long time, your health care provider may want to run tests right away. If your health care provider thinks your muscle pain may be caused by illness, you may need to have additional tests to rule out certain conditions.  Treatment for muscle pain depends on the cause. Home care is often enough to relieve muscle pain. Your health care provider may also prescribe anti-inflammatory medicine. HOME CARE INSTRUCTIONS Watch your condition for any changes. The following actions may help to lessen any discomfort you are feeling:  Only take over-the-counter or prescription medicines as directed by your health care provider.  Apply ice to the sore muscle:  Put ice in a plastic bag.  Place a towel between your skin and the bag.  Leave the ice on for 15-20 minutes, 3-4 times a day.  You may alternate applying hot and cold packs to the muscle as directed by your health care provider.  If overuse is causing your muscle pain, slow down your activities until the pain goes away.  Remember that it is normal to feel some muscle pain after starting a workout program. Muscles that have not been used often will be sore at first.  Do regular, gentle exercises if you are not usually active.  Warm up before exercising to lower your risk of muscle pain.  Do not continue working out if the pain is very bad. Bad pain could mean you have injured a muscle. SEEK MEDICAL CARE IF:  Your muscle pain gets worse, and medicines do not help.  You have muscle pain that lasts longer than 3 days.  You have a rash or fever along with muscle pain.  You have muscle pain  after a tick bite.  You have muscle pain while working out, even though you are in good physical condition.  You have redness, soreness, or swelling along with muscle pain.  You have muscle pain after starting a new medicine or changing the dose of a medicine. SEEK IMMEDIATE MEDICAL CARE IF:  You have trouble breathing.  You have trouble swallowing.  You have muscle pain along with a stiff neck, fever, and vomiting.  You have severe muscle weakness or cannot move part of your body. MAKE SURE YOU:   Understand these instructions.  Will watch your condition.  Will get help right away if you are not doing well or get worse. Document Released: 02/07/2006 Document Revised: 03/23/2013 Document Reviewed: 01/12/2013 [facility] Patient Information 2015 [place], [place]. This information is not intended to replace advice given to you by your health care provider. Make sure you discuss any questions you have with your health care provider.

## 2014-08-19 NOTE — ED Provider Notes (Signed)
CSN: [id number]     Arrival date & time 08/19/14  0043 History   First MD Initiated Contact with Patient 08/19/14 0129     Chief Complaint  Patient presents with  . Numbness     (Consider location/radiation/quality/duration/timing/severity/associated sxs/prior Treatment) HPI Patient presents with several weeks of episodic right hand paresthesias. Paresthesias radiate up the right forearm. Patient states she does not get the paresthesias in the fifth digit. She has no weakness with this. The paresthesias are worse at night and in the morning. Patient drives a bus  for a living. She is right-hand dominant.  Patient also complains of episodic spasms in her lower extremities. She currently has no pain. She denies any injury or swelling. She has no focal weakness or numbness. No bladder or bowel incontinence. Denies back pain. Past Medical History  Diagnosis Date  . Bronchitis   . Heart murmur     "closed by age 36."   Past Surgical History  Procedure Laterality Date  . Rotator cuff repair      Right  . Cystectomy      L wrist  . Tympanostomy tube placement    . Anterior cervical decomp/discectomy fusion  03/19/2012    Procedure: ANTERIOR CERVICAL DECOMPRESSION/DISCECTOMY FUSION 1 LEVEL;  Surgeon: Melina Schools, MD;  Location: [place];  Service: Orthopedics;  Laterality: Left;  Total Disc Replacement C4-5  . Cervical fusion  05/07/2012    Dr Rolena Infante  . Anterior cervical decomp/discectomy fusion N/A 05/07/2012    Procedure: ANTERIOR CERVICAL DECOMPRESSION/DISCECTOMY FUSION 1 LEVEL/HARDWARE REMOVAL;  Surgeon: Melina Schools, MD;  Location: [place];  Service: Orthopedics;  Laterality: N/A;  REMOVAL OF CERVICAL DISC REPLACEMENT AND ACDF C4-5  . Abdominal hysterectomy  2000   Family History  Problem Relation Age of Onset  . Diabetes Mother   . Hypertension Father    History  Substance Use Topics  . Smoking status: Current Every Day Smoker -- 0.50 packs/day for 20 years    Types: Cigarettes   . Smokeless tobacco: Never Used  . Alcohol Use: Yes     Comment: occasional   OB History    No data available     Review of Systems  Constitutional: Negative for fever and chills.  Respiratory: Negative for cough and shortness of breath.   Cardiovascular: Negative for chest pain.  Gastrointestinal: Negative for nausea, vomiting and abdominal pain.  Genitourinary: Negative for dysuria.  Musculoskeletal: Positive for myalgias. Negative for back pain, neck pain and neck stiffness.  Skin: Negative for rash and wound.  Neurological: Positive for numbness. Negative for dizziness, weakness, light-headedness and headaches.  All other systems reviewed and are negative.     Allergies  Meloxicam  Home Medications   Prior to Admission medications   Medication Sig Start Date End Date Taking? Authorizing Provider  acetaminophen (TYLENOL) 650 MG CR tablet Take 650 mg by mouth every 8 (eight) hours as needed for pain.   Yes Historical Provider, MD  albuterol (PROVENTIL HFA;VENTOLIN HFA) 108 (90 BASE) MCG/ACT inhaler Inhale 1-2 puffs into the lungs every 6 (six) hours as needed for wheezing or shortness of breath. 02/24/14  Yes Evelina Bucy, MD  aspirin 325 MG tablet Take 325 mg by mouth every 6 (six) hours as needed for mild pain.    Yes Historical Provider, MD  Multiple Vitamin (MULTIVITAMIN WITH MINERALS) TABS tablet Take 1 tablet by mouth daily.   Yes Historical Provider, MD  azithromycin (ZITHROMAX) 250 MG tablet Take 1 tablet (250 mg total)  by mouth daily. Take first 2 tablets together, then 1 every day until finished. Patient not taking: Reported on 02/24/2014 01/27/14   Everlene Balls, MD  azithromycin (ZITHROMAX) 250 MG tablet Take 1 tablet (250 mg total) by mouth daily. Take 1 tablet daily until finished. Patient not taking: Reported on 08/19/2014 02/25/14   Evelina Bucy, MD  ibuprofen (ADVIL,MOTRIN) 600 MG tablet Take 1 tablet (600 mg total) by mouth every 6 (six) hours as needed.  08/19/14   Julianne Rice, MD  methocarbamol (ROBAXIN) 500 MG tablet Take 2 tablets (1,000 mg total) by mouth every 8 (eight) hours as needed for muscle spasms. 08/19/14   Julianne Rice, MD  oxymetazoline (AFRIN NASAL SPRAY) 0.05 % nasal spray Place 2 sprays into both nostrils 2 (two) times daily. X 2-3 days. DO NOT TAKE FOR LONGER THAN 3 DAYS! Patient not taking: Reported on 02/24/2014 02/05/14   Mercedes Camprubi-Soms, PA-C  predniSONE (DELTASONE) 20 MG tablet Take 3 tablets (60 mg total) by mouth daily. Patient not taking: Reported on 08/19/2014 01/27/14   Everlene Balls, MD  predniSONE (DELTASONE) 20 MG tablet Take 3 tablets (60 mg total) by mouth once. Patient not taking: Reported on 08/19/2014 02/25/14   Evelina Bucy, MD   BP 132/87 mmHg  Pulse 86  Temp(Src) 98.2 F (36.8 C) (Oral)  Resp 18  SpO2 99% Physical Exam  Constitutional: She is oriented to person, place, and time. She appears well-developed and well-nourished. No distress.  HENT:  Head: Normocephalic and atraumatic.  Mouth/Throat: Oropharynx is clear and moist.  Eyes: EOM are normal. Pupils are equal, round, and reactive to light.  Neck: Normal range of motion. Neck supple.  Cardiovascular: Normal rate and regular rhythm.   Pulmonary/Chest: Effort normal and breath sounds normal. No respiratory distress. She has no wheezes. She has no rales. She exhibits no tenderness.  Abdominal: Soft. Bowel sounds are normal. She exhibits no distension. There is no tenderness. There is no rebound and no guarding.  Musculoskeletal: Normal range of motion. She exhibits no edema or tenderness.  No calf swelling or tenderness. Distal pulses intact in all extremities. Positive Tinel and Phalen sign of the right hand. Normal cap refill. No obvious deformity or swelling to the right upper extremity. Full range of motion of all joints.  Neurological: She is alert and oriented to person, place, and time.  5/5 grip strength bilaterally. 5/5 motor  bilateral lower extremities. Sensation is fully intact.  Skin: Skin is warm and dry. No rash noted. No erythema.  Psychiatric: She has a normal mood and affect. Her behavior is normal.  Nursing note and vitals reviewed.   ED Course  Procedures (including critical care time) Labs Review Labs Reviewed  I-STAT CHEM 8, ED - Abnormal; Notable for the following:    BUN 23 (*)    Calcium, Ion 1.28 (*)    Hemoglobin 18.0 (*)    HCT 53.0 (*)    All other components within normal limits    Imaging Review No results found.   EKG Interpretation None      MDM   Final diagnoses:  Carpal tunnel syndrome of right wrist  Myalgia    Suspect carpal tunnel syndrome of the right wrist. Patient placed in Velcro wrist splint. Will start on NSAIDs. Normal electrolytes. Possible muscle spasms For the Patient's Lower Extremity Symptoms. Advised to Stay Well Hydrated and Drink plenty of Water.    Julianne Rice, MD 08/19/14 [phone]

## 2014-08-19 NOTE — ED Notes (Signed)
Pt c/o intermittent pain and numbness on right and left thighs and right fingers (especially in the morning). Denies recent injury/trauma and denies back trauma/injury. Able to ambulate with steady gait. Moving all extremities equally. Denies hx DM or HTN. Not on HTN medications. Facial symmetry noted. No other c/c.

## 2014-08-23 ENCOUNTER — Emergency Department (HOSPITAL_COMMUNITY): Payer: BLUE CROSS/BLUE SHIELD

## 2014-08-23 ENCOUNTER — Encounter (HOSPITAL_COMMUNITY): Payer: Self-pay | Admitting: Emergency Medicine

## 2014-08-23 ENCOUNTER — Emergency Department (INDEPENDENT_AMBULATORY_CARE_PROVIDER_SITE_OTHER)
Admission: EM | Admit: 2014-08-23 | Discharge: 2014-08-23 | Disposition: A | Discharge status: Home / Self Care | Payer: BLUE CROSS/BLUE SHIELD | Source: Home / Self Care | Attending: Family Medicine | Admitting: Family Medicine

## 2014-08-23 ENCOUNTER — Emergency Department (INDEPENDENT_AMBULATORY_CARE_PROVIDER_SITE_OTHER): Payer: BLUE CROSS/BLUE SHIELD

## 2014-08-23 ENCOUNTER — Encounter (HOSPITAL_COMMUNITY): Payer: Self-pay | Admitting: *Deleted

## 2014-08-23 ENCOUNTER — Emergency Department (HOSPITAL_COMMUNITY)
Admission: EM | Admit: 2014-08-23 | Discharge: 2014-08-24 | Disposition: A | Discharge status: Home / Self Care | Payer: BLUE CROSS/BLUE SHIELD | Attending: Emergency Medicine | Admitting: Emergency Medicine

## 2014-08-23 DIAGNOSIS — J40 Bronchitis, not specified as acute or chronic: Secondary | ICD-10-CM

## 2014-08-23 DIAGNOSIS — J410 Simple chronic bronchitis: Secondary | ICD-10-CM

## 2014-08-23 DIAGNOSIS — R1110 Vomiting, unspecified: Secondary | ICD-10-CM | POA: Diagnosis not present

## 2014-08-23 DIAGNOSIS — R05 Cough: Secondary | ICD-10-CM | POA: Diagnosis present

## 2014-08-23 DIAGNOSIS — R011 Cardiac murmur, unspecified: Secondary | ICD-10-CM | POA: Diagnosis not present

## 2014-08-23 DIAGNOSIS — Z7982 Long term (current) use of aspirin: Secondary | ICD-10-CM | POA: Diagnosis not present

## 2014-08-23 DIAGNOSIS — Z720 Tobacco use: Secondary | ICD-10-CM | POA: Insufficient documentation

## 2014-08-23 DIAGNOSIS — Z79899 Other long term (current) drug therapy: Secondary | ICD-10-CM | POA: Diagnosis not present

## 2014-08-23 MED ORDER — METHYLPREDNISOLONE ACETATE 80 MG/ML IJ SUSP
INTRAMUSCULAR | Status: AC
  Filled 2014-08-23: qty 1

## 2014-08-23 MED ORDER — DOXYCYCLINE HYCLATE 100 MG PO CAPS: 100.0000 mg | ORAL_CAPSULE | Freq: Two times a day (BID) | ORAL | Status: DC

## 2014-08-23 MED ORDER — ACETAMINOPHEN 325 MG PO TABS
650.0000 mg | ORAL_TABLET | Freq: Once | ORAL | Status: AC
  Administered 2014-08-23: 650 mg via ORAL
  Filled 2014-08-23: qty 2

## 2014-08-23 MED ORDER — ALBUTEROL SULFATE (2.5 MG/3ML) 0.083% IN NEBU
5.0000 mg | INHALATION_SOLUTION | Freq: Once | RESPIRATORY_TRACT | Status: AC
  Administered 2014-08-23: 5 mg via RESPIRATORY_TRACT
  Filled 2014-08-23: qty 6

## 2014-08-23 MED ORDER — ALBUTEROL SULFATE (2.5 MG/3ML) 0.083% IN NEBU
INHALATION_SOLUTION | RESPIRATORY_TRACT | Status: AC
  Filled 2014-08-23: qty 6

## 2014-08-23 MED ORDER — ALBUTEROL SULFATE (2.5 MG/3ML) 0.083% IN NEBU
5.0000 mg | INHALATION_SOLUTION | Freq: Once | RESPIRATORY_TRACT | Status: AC
  Administered 2014-08-23: 5 mg via RESPIRATORY_TRACT

## 2014-08-23 MED ORDER — IPRATROPIUM BROMIDE 0.02 % IN SOLN
0.5000 mg | Freq: Once | RESPIRATORY_TRACT | Status: AC
  Administered 2014-08-23: 0.5 mg via RESPIRATORY_TRACT
  Filled 2014-08-23: qty 2.5

## 2014-08-23 MED ORDER — IPRATROPIUM BROMIDE 0.02 % IN SOLN
RESPIRATORY_TRACT | Status: AC
  Filled 2014-08-23: qty 2.5

## 2014-08-23 MED ORDER — ACETAMINOPHEN 325 MG PO TABS
ORAL_TABLET | ORAL | Status: AC
  Filled 2014-08-23: qty 2

## 2014-08-23 MED ORDER — IPRATROPIUM BROMIDE 0.02 % IN SOLN
0.5000 mg | Freq: Once | RESPIRATORY_TRACT | Status: AC
  Administered 2014-08-23: 0.5 mg via RESPIRATORY_TRACT

## 2014-08-23 MED ORDER — METHYLPREDNISOLONE ACETATE 40 MG/ML IJ SUSP
80.0000 mg | Freq: Once | INTRAMUSCULAR | Status: AC
  Administered 2014-08-23: 80 mg via INTRAMUSCULAR

## 2014-08-23 MED ORDER — ACETAMINOPHEN 325 MG PO TABS
650.0000 mg | ORAL_TABLET | Freq: Once | ORAL | Status: AC
  Administered 2014-08-23: 650 mg via ORAL

## 2014-08-23 NOTE — ED Notes (Signed)
Pt complains of cough and congestion since yesterday. Pt denies fever. Pt has tried taking delsyium cough medicine without relief.

## 2014-08-23 NOTE — Discharge Instructions (Signed)
Take all of medicine, drink lots of fluids, no more smoking , use mucinex and delsym for cough, see your doctor if further problems

## 2014-08-23 NOTE — ED Provider Notes (Signed)
CSN: [id number]     Arrival date & time 08/23/14  1230 History   First MD Initiated Contact with Patient 08/23/14 1334     Chief Complaint  Patient presents with  . Cough   (Consider location/radiation/quality/duration/timing/severity/associated sxs/prior Treatment) Patient is a 49 y.o. female presenting with cough. The history is provided by the patient.  Cough Cough characteristics:  Non-productive, harsh and productive Sputum characteristics:  White Severity:  Moderate Onset quality:  Sudden Duration:  2 days Timing:  Constant Progression:  Unchanged Chronicity:  Chronic Smoker: yes   Relieved by:  None tried Worsened by:  Smoking Associated symptoms: rhinorrhea, shortness of breath, sinus congestion and wheezing   Associated symptoms: no chest pain and no fever     Past Medical History  Diagnosis Date  . Bronchitis   . Heart murmur     "closed by age 52."   Past Surgical History  Procedure Laterality Date  . Rotator cuff repair      Right  . Cystectomy      L wrist  . Tympanostomy tube placement    . Anterior cervical decomp/discectomy fusion  03/19/2012    Procedure: ANTERIOR CERVICAL DECOMPRESSION/DISCECTOMY FUSION 1 LEVEL;  Surgeon: Melina Schools, MD;  Location: [place];  Service: Orthopedics;  Laterality: Left;  Total Disc Replacement C4-5  . Cervical fusion  05/07/2012    Dr Rolena Infante  . Anterior cervical decomp/discectomy fusion N/A 05/07/2012    Procedure: ANTERIOR CERVICAL DECOMPRESSION/DISCECTOMY FUSION 1 LEVEL/HARDWARE REMOVAL;  Surgeon: Melina Schools, MD;  Location: [place];  Service: Orthopedics;  Laterality: N/A;  REMOVAL OF CERVICAL DISC REPLACEMENT AND ACDF C4-5  . Abdominal hysterectomy  2000   Family History  Problem Relation Age of Onset  . Diabetes Mother   . Hypertension Father    History  Substance Use Topics  . Smoking status: Current Every Day Smoker -- 0.50 packs/day for 20 years    Types: Cigarettes  . Smokeless tobacco: Never Used  .  Alcohol Use: Yes     Comment: occasional   OB History    No data available     Review of Systems  Constitutional: Negative.  Negative for fever.  HENT: Positive for rhinorrhea.   Respiratory: Positive for cough, shortness of breath and wheezing.   Cardiovascular: Negative for chest pain and leg swelling.    Allergies  Meloxicam  Home Medications   Prior to Admission medications   Medication Sig Start Date End Date Taking? Authorizing Provider  acetaminophen (TYLENOL) 650 MG CR tablet Take 650 mg by mouth every 8 (eight) hours as needed for pain.    Historical Provider, MD  albuterol (PROVENTIL HFA;VENTOLIN HFA) 108 (90 BASE) MCG/ACT inhaler Inhale 1-2 puffs into the lungs every 6 (six) hours as needed for wheezing or shortness of breath. 02/24/14   Evelina Bucy, MD  aspirin 325 MG tablet Take 325 mg by mouth every 6 (six) hours as needed for mild pain.     Historical Provider, MD  azithromycin (ZITHROMAX) 250 MG tablet Take 1 tablet (250 mg total) by mouth daily. Take first 2 tablets together, then 1 every day until finished. Patient not taking: Reported on 02/24/2014 01/27/14   Everlene Balls, MD  azithromycin (ZITHROMAX) 250 MG tablet Take 1 tablet (250 mg total) by mouth daily. Take 1 tablet daily until finished. Patient not taking: Reported on 08/19/2014 02/25/14   Evelina Bucy, MD  doxycycline (VIBRAMYCIN) 100 MG capsule Take 1 capsule (100 mg total) by mouth 2 (two) times  daily. 08/23/14   Billy Fischer, MD  ibuprofen (ADVIL,MOTRIN) 600 MG tablet Take 1 tablet (600 mg total) by mouth every 6 (six) hours as needed. 08/19/14   Julianne Rice, MD  methocarbamol (ROBAXIN) 500 MG tablet Take 2 tablets (1,000 mg total) by mouth every 8 (eight) hours as needed for muscle spasms. 08/19/14   Julianne Rice, MD  Multiple Vitamin (MULTIVITAMIN WITH MINERALS) TABS tablet Take 1 tablet by mouth daily.    Historical Provider, MD  oxymetazoline (AFRIN NASAL SPRAY) 0.05 % nasal spray Place 2 sprays  into both nostrils 2 (two) times daily. X 2-3 days. DO NOT TAKE FOR LONGER THAN 3 DAYS! Patient not taking: Reported on 02/24/2014 02/05/14   Mercedes Camprubi-Soms, PA-C  predniSONE (DELTASONE) 20 MG tablet Take 3 tablets (60 mg total) by mouth daily. Patient not taking: Reported on 08/19/2014 01/27/14   Everlene Balls, MD  predniSONE (DELTASONE) 20 MG tablet Take 3 tablets (60 mg total) by mouth once. Patient not taking: Reported on 08/19/2014 02/25/14   Evelina Bucy, MD   BP 135/89 mmHg  Pulse 56  Temp(Src) 99.2 F (37.3 C) (Oral)  Resp 17  SpO2 100% Physical Exam  Constitutional: She is oriented to person, place, and time. She appears well-developed and well-nourished.  HENT:  Mouth/Throat: Oropharynx is clear and moist.  Neck: Normal range of motion. Neck supple.  Pulmonary/Chest: Effort normal. She has decreased breath sounds. She has no wheezes. She has rhonchi.  Lymphadenopathy:    She has no cervical adenopathy.  Neurological: She is alert and oriented to person, place, and time.  Skin: Skin is warm and dry.  Nursing note and vitals reviewed.   ED Course  Procedures (including critical care time) Labs Review Labs Reviewed - No data to display  Imaging Review Dg Chest 2 View  08/23/2014   CLINICAL DATA:  Cough, shortness of breath.  EXAM: CHEST  2 VIEW  COMPARISON:  February 24, 2014.  FINDINGS: The heart size and mediastinal contours are within normal limits. Both lungs are clear. No pneumothorax or pleural effusion is noted. The visualized skeletal structures are unremarkable.  IMPRESSION: No active cardiopulmonary disease.   Electronically Signed   By: Marijo Conception, M.D.   On: 08/23/2014 13:52    X-rays reviewed and report per radiologist.  MDM   1. Bronchitis due to tobacco use    Sx improved at d/c,    Billy Fischer, MD 08/23/14 1452

## 2014-08-23 NOTE — ED Notes (Addendum)
C/o cough, sob, history of asthma/pneumonia.  Onset 5/23.  Patient speaking in complete sentences.

## 2014-08-23 NOTE — ED Provider Notes (Signed)
CSN: [id number]     Arrival date & time 08/23/14  2114 History  This chart was scribed for non-physician practitioner, Britt Bottom, NP-C working with Everlene Balls, MD, by Chester Holstein, ED Scribe. This patient was seen in room WTR6/WTR6 and the patient's care was started at 11:32 PM.      Chief Complaint  Patient presents with  . Cough  . Nasal Congestion     The history is provided by the patient. No language interpreter was used.   HPI Comments: Alisha Espinoza is a 49 y.o. female with PMHx of heart murmur who presents to the Emergency Department complaining of nonproductive cough and congestion with onset yesterday. Pt notes associated rhinorrhea. Pt was seen in Urgent Care earlier today with chest XR done. Pt was d/c with dx of bronchitis due to tobacco use. Pt denies chest pain and fever.    Past Medical History  Diagnosis Date  . Bronchitis   . Heart murmur     "closed by age 49."   Past Surgical History  Procedure Laterality Date  . Rotator cuff repair      Right  . Cystectomy      L wrist  . Tympanostomy tube placement    . Anterior cervical decomp/discectomy fusion  03/19/2012    Procedure: ANTERIOR CERVICAL DECOMPRESSION/DISCECTOMY FUSION 1 LEVEL;  Surgeon: Melina Schools, MD;  Location: [facility];  Service: Orthopedics;  Laterality: Left;  Total Disc Replacement C4-5  . Cervical fusion  05/07/2012    Dr Rolena Infante  . Anterior cervical decomp/discectomy fusion N/A 05/07/2012    Procedure: ANTERIOR CERVICAL DECOMPRESSION/DISCECTOMY FUSION 1 LEVEL/HARDWARE REMOVAL;  Surgeon: Melina Schools, MD;  Location: [facility];  Service: Orthopedics;  Laterality: N/A;  REMOVAL OF CERVICAL DISC REPLACEMENT AND ACDF C4-5  . Abdominal hysterectomy  2000   Family History  Problem Relation Age of Onset  . Diabetes Mother   . Hypertension Father    History  Substance Use Topics  . Smoking status: Current Every Day Smoker -- 0.50 packs/day for 20 years    Types: Cigarettes  . Smokeless  tobacco: Never Used  . Alcohol Use: Yes     Comment: occasional   OB History    No data available     Review of Systems  Constitutional: Negative for fever.  HENT: Positive for congestion and rhinorrhea.   Respiratory: Positive for cough and shortness of breath.   Cardiovascular: Negative for chest pain.  Gastrointestinal: Positive for vomiting.      Allergies  Meloxicam  Home Medications   Prior to Admission medications   Medication Sig Start Date End Date Taking? Authorizing Provider  acetaminophen (TYLENOL) 650 MG CR tablet Take 650 mg by mouth every 8 (eight) hours as needed for pain.    Historical Provider, MD  albuterol (PROVENTIL HFA;VENTOLIN HFA) 108 (90 BASE) MCG/ACT inhaler Inhale 1-2 puffs into the lungs every 6 (six) hours as needed for wheezing or shortness of breath. 02/24/14   Evelina Bucy, MD  aspirin 325 MG tablet Take 325 mg by mouth every 6 (six) hours as needed for mild pain.     Historical Provider, MD  azithromycin (ZITHROMAX) 250 MG tablet Take 1 tablet (250 mg total) by mouth daily. Take first 2 tablets together, then 1 every day until finished. Patient not taking: Reported on 02/24/2014 01/27/14   Everlene Balls, MD  azithromycin (ZITHROMAX) 250 MG tablet Take 1 tablet (250 mg total) by mouth daily. Take 1 tablet daily until finished. Patient not taking:  Reported on 08/19/2014 02/25/14   Evelina Bucy, MD  doxycycline (VIBRAMYCIN) 100 MG capsule Take 1 capsule (100 mg total) by mouth 2 (two) times daily. 08/23/14   Billy Fischer, MD  ibuprofen (ADVIL,MOTRIN) 600 MG tablet Take 1 tablet (600 mg total) by mouth every 6 (six) hours as needed. 08/19/14   Julianne Rice, MD  methocarbamol (ROBAXIN) 500 MG tablet Take 2 tablets (1,000 mg total) by mouth every 8 (eight) hours as needed for muscle spasms. 08/19/14   Julianne Rice, MD  Multiple Vitamin (MULTIVITAMIN WITH MINERALS) TABS tablet Take 1 tablet by mouth daily.    Historical Provider, MD  oxymetazoline (AFRIN  NASAL SPRAY) 0.05 % nasal spray Place 2 sprays into both nostrils 2 (two) times daily. X 2-3 days. DO NOT TAKE FOR LONGER THAN 3 DAYS! Patient not taking: Reported on 02/24/2014 02/05/14   Mercedes Camprubi-Soms, PA-C  predniSONE (DELTASONE) 20 MG tablet Take 3 tablets (60 mg total) by mouth daily. Patient not taking: Reported on 08/19/2014 01/27/14   Everlene Balls, MD  predniSONE (DELTASONE) 20 MG tablet Take 3 tablets (60 mg total) by mouth once. Patient not taking: Reported on 08/19/2014 02/25/14   Evelina Bucy, MD   BP 133/87 mmHg  Pulse 97  Temp(Src) 98 F (36.7 C) (Oral)  Resp 20  Ht 5\' 6"  (1.676 m)  Wt 191 lb (86.637 kg)  BMI 30.84 kg/m2  SpO2 99% Physical Exam  Constitutional: She is oriented to person, place, and time. She appears well-developed and well-nourished.  HENT:  Head: Normocephalic.  Right Ear: Tympanic membrane normal.  Left Ear: Tympanic membrane normal.  Mouth/Throat: Mucous membranes are normal. No posterior oropharyngeal edema or posterior oropharyngeal erythema.  Eyes: Conjunctivae are normal.  Neck: Normal range of motion. Neck supple.  Cardiovascular: Normal rate, regular rhythm, normal heart sounds and intact distal pulses.   Pulmonary/Chest: Effort normal. No respiratory distress. She has wheezes (diffuse). She has no rales. She exhibits no tenderness.  Musculoskeletal: Normal range of motion.  Lymphadenopathy:    She has no cervical adenopathy.  Neurological: She is alert and oriented to person, place, and time.  Skin: Skin is warm and dry.  Psychiatric: She has a normal mood and affect.  Nursing note and vitals reviewed.   ED Course  Procedures (including critical care time) DIAGNOSTIC STUDIES: Oxygen Saturation is 99% on room air, normal by my interpretation.    COORDINATION OF CARE: 11:34 PM Discussed treatment plan with patient at beside, the patient agrees with the plan and has no further questions at this time.  Labs Review Labs Reviewed -  No data to display  Imaging Review Dg Chest 2 View (if Patient Has Fever And/or Copd)  08/23/2014   CLINICAL DATA:  Productive cough, nasal congestion  EXAM: CHEST  2 VIEW  COMPARISON:  08/23/2014  FINDINGS: Cardiomediastinal silhouette is stable. No acute infiltrate or pleural effusion. No pulmonary edema. Minimal perihilar bronchitic changes.  IMPRESSION: No acute infiltrate or pulmonary edema. Minimal perihilar bronchitic changes.   Electronically Signed   By: Lahoma Crocker M.D.   On: 08/23/2014 21:50   Dg Chest 2 View  08/23/2014   CLINICAL DATA:  Cough, shortness of breath.  EXAM: CHEST  2 VIEW  COMPARISON:  February 24, 2014.  FINDINGS: The heart size and mediastinal contours are within normal limits. Both lungs are clear. No pneumothorax or pleural effusion is noted. The visualized skeletal structures are unremarkable.  IMPRESSION: No active cardiopulmonary disease.   Electronically Signed  By: Marijo Conception, M.D.   On: 08/23/2014 13:52     EKG Interpretation None      MDM   Final diagnoses:  Bronchitis   49 yo with symptoms consistent with bronchitis. CXR negative. Symptoms improved after neb tx. Pt will be discharged with symptomatic treatment.  Pt is well-appearing, in no acute distress and vital signs are stable.  They appear safe to be discharged.  Discharge include follow-up with their PCP.  Return precautions provided. Verbalizes understanding and is agreeable with plan.  I personally performed the services described in this documentation, which was scribed in my presence. The recorded information has been reviewed and is accurate.  Filed Vitals:   08/23/14 2214 08/24/14 0050  BP: 133/87 134/78  Pulse: 97 97  Temp: 98 F (36.7 C)   TempSrc: Oral   Resp: 20 22  Height: 5\' 6"  (1.676 m)   Weight: 191 lb (86.637 kg)   SpO2: 99% 99%   Meds given in ED:  Medications  albuterol (PROVENTIL) (2.5 MG/3ML) 0.083% nebulizer solution 5 mg (5 mg Nebulization Given 08/23/14 2356)   ipratropium (ATROVENT) nebulizer solution 0.5 mg (0.5 mg Nebulization Given 08/23/14 2356)  acetaminophen (TYLENOL) tablet 650 mg (650 mg Oral Given 08/23/14 2356)  albuterol (PROVENTIL HFA;VENTOLIN HFA) 108 (90 BASE) MCG/ACT inhaler 2 puff (2 puffs Inhalation Given 08/24/14 0050)    Discharge Medication List as of 08/24/2014 12:22 AM    START taking these medications   Details  Phenylephrine-DM-GG-APAP 5-10-200-325 MG TABS Take 2 tablets by mouth 3 (three) times daily., Starting 08/24/2014, Until Discontinued, Print          Britt Bottom, NP 08/25/14 4765  Everlene Balls, MD 08/25/14 [phone]

## 2014-08-24 MED ORDER — ALBUTEROL SULFATE HFA 108 (90 BASE) MCG/ACT IN AERS
2.0000 | INHALATION_SPRAY | RESPIRATORY_TRACT | Status: AC
  Administered 2014-08-24: 2 via RESPIRATORY_TRACT
  Filled 2014-08-24: qty 6.7

## 2014-08-24 MED ORDER — PHENYLEPHRINE-DM-GG-APAP 5-10-200-325 MG PO TABS: 2.0000 | ORAL_TABLET | Freq: Three times a day (TID) | ORAL | Status: DC

## 2014-08-24 MED ORDER — ALBUTEROL SULFATE (2.5 MG/3ML) 0.083% IN NEBU: 5.0000 mg | INHALATION_SOLUTION | Freq: Once | RESPIRATORY_TRACT | Status: DC

## 2014-08-24 NOTE — Discharge Instructions (Signed)
Please follow the directions provided. Use the resource guide or the referral given to establish care with a primary care doctor. Use the albuterol inhaler 2 puffs every 4 hours to help with cough and shortness of breath. Use the multisymptom cold medicine for discomfort, congestion and other cold symptoms. Don't hesitate to return for any new, worsening, or concerning symptoms.    SEEK IMMEDIATE MEDICAL CARE IF:  You develop an increased fever or chills.  You have chest pain.  You have severe shortness of breath.  You have bloody sputum.  You develop dehydration.  You faint or repeatedly feel like you are going to pass out.  You develop repeated vomiting.  You develop a severe headache.   Emergency Department Resource Guide 1) Find a Doctor and Pay Out of Pocket Although you won't have to find out who is covered by your insurance plan, it is a good idea to ask around and get recommendations. You will then need to call the office and see if the doctor you have chosen will accept you as a new patient and what types of options they offer for patients who are self-pay. Some doctors offer discounts or will set up payment plans for their patients who do not have insurance, but you will need to ask so you aren't surprised when you get to your appointment.  2) Contact Your Local Health Department Not all health departments have doctors that can see patients for sick visits, but many do, so it is worth a call to see if yours does. If you don't know where your local health department is, you can check in your phone book. The CDC also has a tool to help you locate your state's health department, and many state websites also have listings of all of their local health departments.  3) Find a [facility] If your illness is not likely to be very severe or complicated, you may want to try a walk in clinic. These are popping up all over the country in pharmacies, drugstores, and shopping centers. They're  usually staffed by nurse practitioners or physician assistants that have been trained to treat common illnesses and complaints. They're usually fairly quick and inexpensive. However, if you have serious medical issues or chronic medical problems, these are probably not your best option.  No Primary Care Doctor: - Call Health Connect at  [phone] - they can help you locate a primary care doctor that  accepts your insurance, provides certain services, etc. - Physician Referral Service- [phone]  Chronic Pain Problems: Organization         Address  Phone   Notes  [facility]  [phone] Patients need to be referred by their primary care doctor.   Medication Assistance: Organization         Address  Phone   Notes  [facility] Medication [facility] [address] [place], [place] [address] [phone] --Must be a resident of [facility] -- Must have NO insurance coverage whatsoever (no Medicaid/ Medicare, etc.) -- The pt. MUST have a primary care doctor that directs their care regularly and follows them in the community   MedAssist  [phone]   [employer]  [phone]    Agencies that provide inexpensive medical care: Organization         Address  Phone   Notes  [place]  [phone]   [facility]    [phone]  [facility] [place], [place] [address] [phone]   [place]. [address], [place] [phone]   Planned Parenthood    [phone]   [facility]    [phone]   [place] and [place] [address], [place] Phone:  [phone], Fax:  [phone] Hours of Operation:  9 am - 6 pm, M-F.  Also accepts Medicaid/Medicare and self-pay.  [facility] for [place], [address], [place] Phone: [phone], Fax: [phone]. Hours  of Operation:  8:30 am - 5:30 pm, M-F.  Also accepts Medicaid and self-pay.  [facility] [place] [address], [place] Phone: [phone]   [place], [place], [place] [phone], Ext. [phone] Mondays & Thursdays: 7-9 AM.  First 15 patients are seen on a first come, first serve basis.    [place] Providers:  Organization         Address  Phone   Notes  [facility] [address], [place] [phone] Also accepts self-pay patients.  [facility] [address], [place]  [phone]   [place], [address], [place] [phone]   [facility] [address] [place] [phone]   Lucianne Lei [address], [place]   [phone] Only accepts [place] Access [place] patients after they have their name applied to their card.   Self-Pay (no insurance) in [facility]:  Organization         Address  Phone   Notes  Sickle Cell Patients, [facility] [place] [phone]   [facility] Urgent Care [place] [phone]   [facility] [place]  [place], [place], [place] [phone]   [facility]/Dr. Osei-Bonsu  [address] [place] or [address], [place] [phone] Phone number for both [place] and [place] locations is the same.  Urgent Medical and [facility] [address], [place] [phone]   [facility] [address], [place] or [address] [phone]   [facility] [address], [place] [phone], phone; [phone], fax Sees patients 1st and 3rd Saturday of every month.  Must not qualify for public or private insurance (i.e. Medicaid,  Medicare, [place] Health Choice, Veterans' Benefits)  Household income should be no more than 200% of the poverty level The clinic cannot treat you if you are pregnant or think you are pregnant  Sexually transmitted diseases are not treated at the clinic.    Dental Care: Organization         Address  Phone  Notes  [facility] Department of [facility] [place] [phone] Accepts children up to age 33 who are enrolled in [place] or [place]; pregnant women with a Medicaid card; and children who have applied for Medicaid or [place] Health Choice, but were declined, whose parents can pay a reduced fee at time of service.  [facility] Department of [facility]  [address], [place] [phone] Accepts children up to age 53 who  are enrolled in Medicaid or [place] Health Choice; pregnant women with a Medicaid card; and children who have applied for Medicaid or [place] Health Choice, but were declined, whose parents can pay a reduced fee at time of service.  [facility] Adult Dental Access PROGRAM  [place] [phone] Patients are seen by appointment only. Walk-ins are not accepted. [place] will see patients 40 years of age and older. Monday - Tuesday (8am-5pm) Most Wednesdays (8:30-5pm) $30 per visit, cash only  [facility] Adult Dental Access PROGRAM  [address], [facility] [phone] Patients are seen by appointment only. Walk-ins are not accepted. [place] will see patients 81 years of age and older. One Wednesday Evening (Monthly: Volunteer Based).  $30 per visit, cash only  [place]  [phone] for adults; Children under age 53, call Graduate Pediatric Dentistry at [phone]. Children aged 49-14, please call [phone] to request a pediatric application.  Dental services are provided in all areas of dental care including fillings, crowns  and bridges, complete and partial dentures, implants, gum treatment, root canals, and extractions. Preventive care is also provided. Treatment is provided to both adults and children. Patients are selected via a lottery and there is often a waiting list.   [facility] [address], [place]  [phone] [email or web address]   [facility] [address] [place], [place] [phone], Ext. [phone] Second and Fourth Thursday of each month, opens at 6:30 AM; Clinic ends at 9 AM.  Patients are seen on a first-come first-served basis, and a limited number are seen during each clinic.   [facility]  [address] Hillard Danker [place], [place] [phone]   Eligibility Requirements You must have lived in [place], [place], or [place] counties for at least the last three months.   You cannot be eligible for state or federal sponsored [place] [employer], including [employer], [place], or [employer].   You generally cannot be eligible for healthcare insurance through your employer.    How to apply: Eligibility screenings are held every Tuesday and Wednesday afternoon from 1:00 pm until 4:00 pm. You do not need an appointment for the interview!  [facility] [address] [place], [place]   [place]  [place] Department  [place]  [phone]    Behavioral Health Resources in the Community: Intensive Outpatient Programs Organization         Address  Phone  Notes  [place]. [address] [place], [place] [phone]   [facility] [address] [place], [place]   ADS: Alcohol & Drug Svcs [address], [place], [place]   [place] [address]  [place], [place] or [phone]   Substance Abuse  Resources Organization         Address  Phone  Notes  Alcohol and Drug Services  [phone]   [place]  [phone]   The [place]   Chinita Pester  [phone]   Residential & Outpatient Substance Abuse Program  [phone]   Psychological Services Organization         Address  Phone  Notes  [facility] [place]  [place]  [phone]   [place] [address],  [place] [phone] or [phone]    Mobile Crisis Teams Organization         Address  Phone  Notes  [facility], Mobile Crisis Care Unit  [phone]   [facility]ive Psychotherapeutic Services  [address] [place], [place]   [place] [address], [place] [phone]    Self-Help/Support Groups Organization         Address  Phone             Notes  [place]. of [place] - variety of support groups  [place] Call for more information  Narcotics Anonymous (NA), [facility] [address], [employer]   2 meetings at this location   Special educational needs teacher         Address  Phone  Notes  [facility] [place],    [place]  [phone]   [facility]  [address] [place] [address], [place], [place]   [place], [place] [phone] Admissions: 8am-3pm M-F  Incentives Substance [place] [address],    [place], [place] [phone]   [facility] [address] [place], [place], [place]   [facility] [address],  [place], [place]   [facility] - Intensive Outpatient [address], [place], [place]   [facility] ([place]  [place], [place] [phone] or [phone]   [facility] Services (RTS) [address], [place], [place] Accepts Medicaid  [facility] [place] [address]  [place] [phone] Substance Abuse/Addiction Treatment   [facility] Organization         Address  Phone  Notes  [facility]  [phone]   Domenic Schwab, PhD [address] Arlis [place], [place]   [phone] or [phone]   [place], [place] [phone]   [facility] [address], [place], [place] [phone] Insurance/Medicaid/sponsorship through [facility] and Families [address] [place]                                    [place], [place] [phone] [place] [address], [place] [phone]    Dr. Adele Schilder  [phone]   [facility] Dept. 1) [address] [place] 2) [place] 3)  [place], [place] [phone]  [phone]   [place] [phone] or [phone] (After Hours)

## 2014-08-26 ENCOUNTER — Emergency Department (HOSPITAL_COMMUNITY): Payer: BLUE CROSS/BLUE SHIELD

## 2014-08-26 ENCOUNTER — Encounter (HOSPITAL_COMMUNITY): Payer: Self-pay | Admitting: *Deleted

## 2014-08-26 ENCOUNTER — Emergency Department (HOSPITAL_COMMUNITY)
Admission: EM | Admit: 2014-08-26 | Discharge: 2014-08-27 | Disposition: A | Discharge status: Home / Self Care | Payer: BLUE CROSS/BLUE SHIELD | Attending: Emergency Medicine | Admitting: Emergency Medicine

## 2014-08-26 DIAGNOSIS — J069 Acute upper respiratory infection, unspecified: Secondary | ICD-10-CM | POA: Diagnosis not present

## 2014-08-26 DIAGNOSIS — Z79899 Other long term (current) drug therapy: Secondary | ICD-10-CM | POA: Diagnosis not present

## 2014-08-26 DIAGNOSIS — B9789 Other viral agents as the cause of diseases classified elsewhere: Secondary | ICD-10-CM

## 2014-08-26 DIAGNOSIS — Z7982 Long term (current) use of aspirin: Secondary | ICD-10-CM | POA: Insufficient documentation

## 2014-08-26 DIAGNOSIS — R05 Cough: Secondary | ICD-10-CM | POA: Diagnosis present

## 2014-08-26 DIAGNOSIS — R011 Cardiac murmur, unspecified: Secondary | ICD-10-CM | POA: Insufficient documentation

## 2014-08-26 DIAGNOSIS — Z720 Tobacco use: Secondary | ICD-10-CM | POA: Insufficient documentation

## 2014-08-26 NOTE — ED Notes (Signed)
Pt c/o URI. Pt was seen in ED for same on 5/24. Pt states she is not getting better.

## 2014-08-27 MED ORDER — ALBUTEROL SULFATE (2.5 MG/3ML) 0.083% IN NEBU
5.0000 mg | INHALATION_SOLUTION | Freq: Once | RESPIRATORY_TRACT | Status: AC
  Administered 2014-08-27: 5 mg via RESPIRATORY_TRACT
  Filled 2014-08-27: qty 6

## 2014-08-27 MED ORDER — PREDNISONE 20 MG PO TABS: ORAL_TABLET | ORAL | Status: DC

## 2014-08-27 MED ORDER — PSEUDOEPH-BROMPHEN-DM 30-2-10 MG/5ML PO SYRP: 5.0000 mL | ORAL_SOLUTION | Freq: Three times a day (TID) | ORAL | Status: DC | PRN

## 2014-08-27 NOTE — Discharge Instructions (Signed)
Upper Respiratory Infection, Adult An upper respiratory infection (URI) is also sometimes known as the common cold. The upper respiratory tract includes the nose, sinuses, throat, trachea, and bronchi. Bronchi are the airways leading to the lungs. Most people improve within 1 week, but symptoms can last up to 2 weeks. A residual cough may last even longer.  CAUSES Many different viruses can infect the tissues lining the upper respiratory tract. The tissues become irritated and inflamed and often become very moist. Mucus production is also common. A cold is contagious. You can easily spread the virus to others by oral contact. This includes kissing, sharing a glass, coughing, or sneezing. Touching your mouth or nose and then touching a surface, which is then touched by another person, can also spread the virus. SYMPTOMS  Symptoms typically develop 1 to 3 days after you come in contact with a cold virus. Symptoms vary from person to person. They may include:  Runny nose.  Sneezing.  Nasal congestion.  Sinus irritation.  Sore throat.  Loss of voice (laryngitis).  Cough.  Fatigue.  Muscle aches.  Loss of appetite.  Headache.  Low-grade fever. DIAGNOSIS  You might diagnose your own cold based on familiar symptoms, since most people get a cold 2 to 3 times a year. Your caregiver can confirm this based on your exam. Most importantly, your caregiver can check that your symptoms are not due to another disease such as strep throat, sinusitis, pneumonia, asthma, or epiglottitis. Blood tests, throat tests, and X-rays are not necessary to diagnose a common cold, but they may sometimes be helpful in excluding other more serious diseases. Your caregiver will decide if any further tests are required. RISKS AND COMPLICATIONS  You may be at risk for a more severe case of the common cold if you smoke cigarettes, have chronic heart disease (such as heart failure) or lung disease (such as asthma), or  if you have a weakened immune system. The very young and very old are also at risk for more serious infections. Bacterial sinusitis, middle ear infections, and bacterial pneumonia can complicate the common cold. The common cold can worsen asthma and chronic obstructive pulmonary disease (COPD). Sometimes, these complications can require emergency medical care and may be life-threatening. PREVENTION  The best way to protect against getting a cold is to practice good hygiene. Avoid oral or hand contact with people with cold symptoms. Wash your hands often if contact occurs. There is no clear evidence that vitamin C, vitamin E, echinacea, or exercise reduces the chance of developing a cold. However, it is always recommended to get plenty of rest and practice good nutrition. TREATMENT  Treatment is directed at relieving symptoms. There is no cure. Antibiotics are not effective, because the infection is caused by a virus, not by bacteria. Treatment may include:  Increased fluid intake. Sports drinks offer valuable electrolytes, sugars, and fluids.  Breathing heated mist or steam (vaporizer or shower).  Eating chicken soup or other clear broths, and maintaining good nutrition.  Getting plenty of rest.  Using gargles or lozenges for comfort.  Controlling fevers with ibuprofen or acetaminophen as directed by your caregiver.  Increasing usage of your inhaler if you have asthma. Zinc gel and zinc lozenges, taken in the first 24 hours of the common cold, can shorten the duration and lessen the severity of symptoms. Pain medicines may help with fever, muscle aches, and throat pain. A variety of non-prescription medicines are available to treat congestion and runny nose. Your caregiver  can make recommendations and may suggest nasal or lung inhalers for other symptoms.  HOME CARE INSTRUCTIONS   Only take over-the-counter or prescription medicines for pain, discomfort, or fever as directed by your  caregiver.  Use a warm mist humidifier or inhale steam from a shower to increase air moisture. This may keep secretions moist and make it easier to breathe.  Drink enough water and fluids to keep your urine clear or pale yellow.  Rest as needed.  Return to work when your temperature has returned to normal or as your caregiver advises. You may need to stay home longer to avoid infecting others. You can also use a face mask and careful hand washing to prevent spread of the virus. SEEK MEDICAL CARE IF:   After the first few days, you feel you are getting worse rather than better.  You need your caregiver's advice about medicines to control symptoms.  You develop chills, worsening shortness of breath, or brown or red sputum. These may be signs of pneumonia.  You develop yellow or brown nasal discharge or pain in the face, especially when you bend forward. These may be signs of sinusitis.  You develop a fever, swollen neck glands, pain with swallowing, or white areas in the back of your throat. These may be signs of strep throat. SEEK IMMEDIATE MEDICAL CARE IF:   You have a fever.  You develop severe or persistent headache, ear pain, sinus pain, or chest pain.  You develop wheezing, a prolonged cough, cough up blood, or have a change in your usual mucus (if you have chronic lung disease).  You develop sore muscles or a stiff neck. Document Released: 09/11/2000 Document Revised: 06/10/2011 Document Reviewed: 06/23/2013 [facility] Patient Information 2015 [place], [place]. This information is not intended to replace advice given to you by your health care provider. Make sure you discuss any questions you have with your health care provider. Cough, Adult  A cough is a reflex. It helps you clear your throat and airways. A cough can help heal your body. A cough can last 2 or 3 weeks (acute) or may last more than 8 weeks (chronic). Some common causes of a cough can include an infection, allergy, or  a cold. HOME CARE  Only take medicine as told by your doctor.  If given, take your medicines (antibiotics) as told. Finish them even if you start to feel better.  Use a cold steam vaporizer or humidifier in your home. This can help loosen thick spit (secretions).  Sleep so you are almost sitting up (semi-upright). Use pillows to do this. This helps reduce coughing.  Rest as needed.  Stop smoking if you smoke. GET HELP RIGHT AWAY IF:  You have yellowish-white fluid (pus) in your thick spit.  Your cough gets worse.  Your medicine does not reduce coughing, and you are losing sleep.  You cough up blood.  You have trouble breathing.  Your pain gets worse and medicine does not help.  You have a fever. MAKE SURE YOU:   Understand these instructions.  Will watch your condition.  Will get help right away if you are not doing well or get worse. Document Released: 11/29/2010 Document Revised: 08/02/2013 Document Reviewed: 11/29/2010 [facility] Patient Information 2015 [place], [place]. This information is not intended to replace advice given to you by your health care provider. Make sure you discuss any questions you have with your health care provider.

## 2014-08-27 NOTE — ED Notes (Signed)
Patient transported to X-ray 

## 2014-08-27 NOTE — ED Notes (Signed)
Pt. requested another nebulizer treatment before discharge , NP notified .

## 2014-08-27 NOTE — ED Provider Notes (Signed)
CSN: [id number]     Arrival date & time 08/26/14  2314 History   First MD Initiated Contact with Patient 08/27/14 0029     Chief Complaint  Patient presents with  . URI     (Consider location/radiation/quality/duration/timing/severity/associated sxs/prior Treatment) HPI Comments: Patient seen at Urgent Care on 5/24 for URI symptoms, seen again in the ED on 5/26, returns again today stating that she does not feel like she is getting any better.  She endorses continuing cough, nasal congestion.  She is a smoker. No documented fever. She was started on doxycycline when seen at urgent care.  Patient is a 49 y.o. female presenting with URI. The history is provided by the patient and medical records. No language interpreter was used.  URI Presenting symptoms: congestion, cough, fatigue and rhinorrhea   Severity:  Moderate Duration:  3 days Progression:  Waxing and waning Chronicity:  New Associated symptoms: wheezing     Past Medical History  Diagnosis Date  . Bronchitis   . Heart murmur     "closed by age 65."   Past Surgical History  Procedure Laterality Date  . Rotator cuff repair      Right  . Cystectomy      L wrist  . Tympanostomy tube placement    . Anterior cervical decomp/discectomy fusion  03/19/2012    Procedure: ANTERIOR CERVICAL DECOMPRESSION/DISCECTOMY FUSION 1 LEVEL;  Surgeon: Melina Schools, MD;  Location: [facility];  Service: Orthopedics;  Laterality: Left;  Total Disc Replacement C4-5  . Cervical fusion  05/07/2012    Dr Rolena Infante  . Anterior cervical decomp/discectomy fusion N/A 05/07/2012    Procedure: ANTERIOR CERVICAL DECOMPRESSION/DISCECTOMY FUSION 1 LEVEL/HARDWARE REMOVAL;  Surgeon: Melina Schools, MD;  Location: [facility];  Service: Orthopedics;  Laterality: N/A;  REMOVAL OF CERVICAL DISC REPLACEMENT AND ACDF C4-5  . Abdominal hysterectomy  2000   Family History  Problem Relation Age of Onset  . Diabetes Mother   . Hypertension Father    History  Substance Use  Topics  . Smoking status: Current Every Day Smoker -- 0.50 packs/day for 20 years    Types: Cigarettes  . Smokeless tobacco: Never Used  . Alcohol Use: Yes     Comment: occasional   OB History    No data available     Review of Systems  Constitutional: Positive for fatigue.  HENT: Positive for congestion, rhinorrhea and voice change.   Respiratory: Positive for cough and wheezing.   All other systems reviewed and are negative.     Allergies  Meloxicam  Home Medications   Prior to Admission medications   Medication Sig Start Date End Date Taking? Authorizing Provider  acetaminophen (TYLENOL) 650 MG CR tablet Take 650 mg by mouth every 8 (eight) hours as needed for pain.    Historical Provider, MD  albuterol (PROVENTIL HFA;VENTOLIN HFA) 108 (90 BASE) MCG/ACT inhaler Inhale 1-2 puffs into the lungs every 6 (six) hours as needed for wheezing or shortness of breath. 02/24/14   Evelina Bucy, MD  aspirin 325 MG tablet Take 325 mg by mouth every 6 (six) hours as needed for mild pain.     Historical Provider, MD  azithromycin (ZITHROMAX) 250 MG tablet Take 1 tablet (250 mg total) by mouth daily. Take first 2 tablets together, then 1 every day until finished. Patient not taking: Reported on 02/24/2014 01/27/14   Everlene Balls, MD  azithromycin (ZITHROMAX) 250 MG tablet Take 1 tablet (250 mg total) by mouth daily. Take 1 tablet  daily until finished. Patient not taking: Reported on 08/19/2014 02/25/14   Evelina Bucy, MD  doxycycline (VIBRAMYCIN) 100 MG capsule Take 1 capsule (100 mg total) by mouth 2 (two) times daily. 08/23/14   Billy Fischer, MD  ibuprofen (ADVIL,MOTRIN) 600 MG tablet Take 1 tablet (600 mg total) by mouth every 6 (six) hours as needed. 08/19/14   Julianne Rice, MD  methocarbamol (ROBAXIN) 500 MG tablet Take 2 tablets (1,000 mg total) by mouth every 8 (eight) hours as needed for muscle spasms. 08/19/14   Julianne Rice, MD  Multiple Vitamin (MULTIVITAMIN WITH MINERALS) TABS  tablet Take 1 tablet by mouth daily.    Historical Provider, MD  oxymetazoline (AFRIN NASAL SPRAY) 0.05 % nasal spray Place 2 sprays into both nostrils 2 (two) times daily. X 2-3 days. DO NOT TAKE FOR LONGER THAN 3 DAYS! Patient not taking: Reported on 02/24/2014 02/05/14   Mercedes Camprubi-Soms, PA-C  Phenylephrine-DM-GG-APAP 5-10-200-325 MG TABS Take 2 tablets by mouth 3 (three) times daily. 08/24/14   Britt Bottom, NP  predniSONE (DELTASONE) 20 MG tablet Take 3 tablets (60 mg total) by mouth daily. Patient not taking: Reported on 08/19/2014 01/27/14   Everlene Balls, MD  predniSONE (DELTASONE) 20 MG tablet Take 3 tablets (60 mg total) by mouth once. Patient not taking: Reported on 08/19/2014 02/25/14   Evelina Bucy, MD   BP 159/102 mmHg  Pulse 95  Temp(Src) 98.5 F (36.9 C)  Resp 20  SpO2 96% Physical Exam  Constitutional: She is oriented to person, place, and time. She appears well-developed and well-nourished.  HENT:  Head: Normocephalic.  Eyes: Conjunctivae are normal.  Neck: Neck supple.  Cardiovascular: Normal rate and regular rhythm.   Pulmonary/Chest: She has wheezes. She exhibits no tenderness.  Abdominal: Soft. Bowel sounds are normal.  Musculoskeletal: She exhibits no edema or tenderness.  Lymphadenopathy:    She has no cervical adenopathy.  Neurological: She is alert and oriented to person, place, and time.  Skin: Skin is warm and dry.  Psychiatric: She has a normal mood and affect.  Nursing note and vitals reviewed.   ED Course  Procedures (including critical care time) Labs Review Labs Reviewed - No data to display  Imaging Review Dg Chest 2 View  08/27/2014   CLINICAL DATA:  Acute onset of cough for 3 days.  Initial encounter.  EXAM: CHEST  2 VIEW  COMPARISON:  Chest radiograph performed 08/23/2014  FINDINGS: The lungs are well-aerated. Pulmonary vascularity is at the upper limits of normal. Mild right basilar atelectasis is noted. There is no evidence of  pleural effusion or pneumothorax.  The heart is normal in size; the mediastinal contour is within normal limits. No acute osseous abnormalities are seen. Cervical spinal fusion hardware is noted.  IMPRESSION: Mild right basilar atelectasis noted; lungs otherwise clear.   Electronically Signed   By: Garald Balding M.D.   On: 08/27/2014 00:19     EKG Interpretation None     Radiology results reviewed and shared with patient.  No pneumonia.  Patient received nebulized albuterol treatments x 2 with improvement in wheezing. Will add short course of steroids to treatment plan. Continue with albuterol MDI at home. Follow-up with PCP. Return precautions discussed. MDM   Final diagnoses:  None    Viral URI with cough.    Etta Quill, NP 08/27/14 0086  Veatrice Kells, MD 08/27/14 [phone]

## 2014-09-26 ENCOUNTER — Emergency Department (HOSPITAL_COMMUNITY)
Admission: EM | Admit: 2014-09-26 | Discharge: 2014-09-26 | Disposition: A | Discharge status: Home / Self Care | Payer: BLUE CROSS/BLUE SHIELD | Attending: Emergency Medicine | Admitting: Emergency Medicine

## 2014-09-26 ENCOUNTER — Encounter (HOSPITAL_COMMUNITY): Payer: Self-pay | Admitting: Emergency Medicine

## 2014-09-26 DIAGNOSIS — R590 Localized enlarged lymph nodes: Secondary | ICD-10-CM | POA: Diagnosis not present

## 2014-09-26 DIAGNOSIS — Z8709 Personal history of other diseases of the respiratory system: Secondary | ICD-10-CM | POA: Insufficient documentation

## 2014-09-26 DIAGNOSIS — Z720 Tobacco use: Secondary | ICD-10-CM | POA: Diagnosis not present

## 2014-09-26 DIAGNOSIS — R011 Cardiac murmur, unspecified: Secondary | ICD-10-CM | POA: Diagnosis not present

## 2014-09-26 DIAGNOSIS — M542 Cervicalgia: Secondary | ICD-10-CM | POA: Diagnosis present

## 2014-09-26 DIAGNOSIS — Z79899 Other long term (current) drug therapy: Secondary | ICD-10-CM | POA: Diagnosis not present

## 2014-09-26 DIAGNOSIS — Z7982 Long term (current) use of aspirin: Secondary | ICD-10-CM | POA: Insufficient documentation

## 2014-09-26 MED ORDER — CEPHALEXIN 500 MG PO CAPS: 1000.0000 mg | ORAL_CAPSULE | Freq: Two times a day (BID) | ORAL | Status: DC

## 2014-09-26 NOTE — Discharge Instructions (Signed)

## 2014-09-26 NOTE — ED Notes (Signed)
Pt reports knot on right front of her neck x 4 days with moderate pain. "sometimes it feels hard and today at work I had a sharp pain above the knot." Denies sore throat or sob.
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
## 2015-08-06 ENCOUNTER — Encounter (HOSPITAL_COMMUNITY): Payer: Self-pay | Admitting: Emergency Medicine

## 2015-08-06 ENCOUNTER — Emergency Department (HOSPITAL_COMMUNITY)
Admission: EM | Admit: 2015-08-06 | Discharge: 2015-08-06 | Disposition: A | Discharge status: Home / Self Care | Payer: BLUE CROSS/BLUE SHIELD | Attending: Emergency Medicine | Admitting: Emergency Medicine

## 2015-08-06 ENCOUNTER — Emergency Department (HOSPITAL_COMMUNITY): Payer: BLUE CROSS/BLUE SHIELD

## 2015-08-06 DIAGNOSIS — J40 Bronchitis, not specified as acute or chronic: Secondary | ICD-10-CM

## 2015-08-06 DIAGNOSIS — Z79899 Other long term (current) drug therapy: Secondary | ICD-10-CM | POA: Insufficient documentation

## 2015-08-06 DIAGNOSIS — Z7951 Long term (current) use of inhaled steroids: Secondary | ICD-10-CM | POA: Insufficient documentation

## 2015-08-06 DIAGNOSIS — Z7982 Long term (current) use of aspirin: Secondary | ICD-10-CM | POA: Insufficient documentation

## 2015-08-06 DIAGNOSIS — Z791 Long term (current) use of non-steroidal anti-inflammatories (NSAID): Secondary | ICD-10-CM | POA: Insufficient documentation

## 2015-08-06 DIAGNOSIS — Z792 Long term (current) use of antibiotics: Secondary | ICD-10-CM | POA: Insufficient documentation

## 2015-08-06 DIAGNOSIS — F17210 Nicotine dependence, cigarettes, uncomplicated: Secondary | ICD-10-CM | POA: Insufficient documentation

## 2015-08-06 DIAGNOSIS — Z7952 Long term (current) use of systemic steroids: Secondary | ICD-10-CM | POA: Insufficient documentation

## 2015-08-06 LAB — BASIC METABOLIC PANEL WITH GFR
Anion gap: 9 (ref 5–15)
BUN: 11 mg/dL (ref 6–20)
CO2: 24 mmol/L (ref 22–32)
Calcium: 9.1 mg/dL (ref 8.9–10.3)
Chloride: 106 mmol/L (ref 101–111)
Creatinine, Ser: 0.74 mg/dL (ref 0.44–1.00)
GFR calc Af Amer: >60 mL/min
GFR calc non Af Amer: >60 mL/min
Glucose, Bld: 112 mg/dL — ABNORMAL HIGH (ref 65–99)
Potassium: 3.6 mmol/L (ref 3.5–5.1)
Sodium: 139 mmol/L (ref 135–145)

## 2015-08-06 LAB — CBC WITH DIFFERENTIAL/PLATELET
Basophils Absolute: 0.1 K/uL (ref 0.0–0.1)
Basophils Relative: 1 %
Eosinophils Absolute: 0.5 K/uL (ref 0.0–0.7)
Eosinophils Relative: 5 %
HCT: 41.8 % (ref 36.0–46.0)
Hemoglobin: 13.9 g/dL (ref 12.0–15.0)
Lymphocytes Relative: 32 %
Lymphs Abs: 3.3 K/uL (ref 0.7–4.0)
MCH: 26.8 pg (ref 26.0–34.0)
MCHC: 33.3 g/dL (ref 30.0–36.0)
MCV: 80.5 fL (ref 78.0–100.0)
Monocytes Absolute: 0.5 K/uL (ref 0.1–1.0)
Monocytes Relative: 5 %
Neutro Abs: 5.9 K/uL (ref 1.7–7.7)
Neutrophils Relative %: 57 %
Platelets: 228 K/uL (ref 150–400)
RBC: 5.19 MIL/uL — ABNORMAL HIGH (ref 3.87–5.11)
RDW: 15.7 % — ABNORMAL HIGH (ref 11.5–15.5)
WBC: 10.2 K/uL (ref 4.0–10.5)

## 2015-08-06 LAB — BRAIN NATRIURETIC PEPTIDE: B Natriuretic Peptide: 44.9 pg/mL (ref 0.0–100.0)

## 2015-08-06 LAB — I-STAT TROPONIN, ED: Troponin i, poc: 0.01 ng/mL (ref 0.00–0.08)

## 2015-08-06 MED ORDER — ALBUTEROL SULFATE (2.5 MG/3ML) 0.083% IN NEBU
5.0000 mg | INHALATION_SOLUTION | Freq: Once | RESPIRATORY_TRACT | Status: AC
  Administered 2015-08-06: 5 mg via RESPIRATORY_TRACT
  Filled 2015-08-06: qty 6

## 2015-08-06 MED ORDER — ALBUTEROL (5 MG/ML) CONTINUOUS INHALATION SOLN
10.0000 mg/h | INHALATION_SOLUTION | RESPIRATORY_TRACT | Status: DC
  Administered 2015-08-06: 10 mg/h via RESPIRATORY_TRACT
  Filled 2015-08-06: qty 20

## 2015-08-06 MED ORDER — PREDNISONE 50 MG PO TABS: ORAL_TABLET | ORAL | Status: DC

## 2015-08-06 MED ORDER — DEXAMETHASONE SODIUM PHOSPHATE 10 MG/ML IJ SOLN
10.0000 mg | Freq: Once | INTRAMUSCULAR | Status: AC
  Administered 2015-08-06: 10 mg via INTRAVENOUS
  Filled 2015-08-06: qty 1

## 2015-08-06 MED ORDER — AZITHROMYCIN 250 MG PO TABS: 250.0000 mg | ORAL_TABLET | Freq: Every day | ORAL | Status: DC

## 2015-08-06 MED ORDER — ALBUTEROL SULFATE HFA 108 (90 BASE) MCG/ACT IN AERS
1.0000 | INHALATION_SPRAY | Freq: Once | RESPIRATORY_TRACT | Status: AC
  Administered 2015-08-06: 1 via RESPIRATORY_TRACT
  Filled 2015-08-06: qty 6.7

## 2015-08-06 NOTE — ED Notes (Signed)
Respiratory notified of continuous neb. 

## 2015-08-06 NOTE — Discharge Instructions (Signed)
Upper Respiratory Infection, Adult Most upper respiratory infections (URIs) are a viral infection of the air passages leading to the lungs. A URI affects the nose, throat, and upper air passages. The most common type of URI is nasopharyngitis and is typically referred to as "the common cold." URIs run their course and usually go away on their own. Most of the time, a URI does not require medical attention, but sometimes a bacterial infection in the upper airways can follow a viral infection. This is called a secondary infection. Sinus and middle ear infections are common types of secondary upper respiratory infections. Bacterial pneumonia can also complicate a URI. A URI can worsen asthma and chronic obstructive pulmonary disease (COPD). Sometimes, these complications can require emergency medical care and may be life threatening.  CAUSES Almost all URIs are caused by viruses. A virus is a type of germ and can spread from one person to another.  RISKS FACTORS You may be at risk for a URI if:   You smoke.   You have chronic heart or lung disease.  You have a weakened defense (immune) system.   You are very young or very old.   You have nasal allergies or asthma.  You work in crowded or poorly ventilated areas.  You work in health care facilities or schools. SIGNS AND SYMPTOMS  Symptoms typically develop 2-3 days after you come in contact with a cold virus. Most viral URIs last 7-10 days. However, viral URIs from the influenza virus (flu virus) can last 14-18 days and are typically more severe. Symptoms may include:   Runny or stuffy (congested) nose.   Sneezing.   Cough.   Sore throat.   Headache.   Fatigue.   Fever.   Loss of appetite.   Pain in your forehead, behind your eyes, and over your cheekbones (sinus pain).  Muscle aches.  DIAGNOSIS  Your health care provider may diagnose a URI by:  Physical exam.  Tests to check that your symptoms are not due to  another condition such as:  Strep throat.  Sinusitis.  Pneumonia.  Asthma. TREATMENT  A URI goes away on its own with time. It cannot be cured with medicines, but medicines may be prescribed or recommended to relieve symptoms. Medicines may help:  Reduce your fever.  Reduce your cough.  Relieve nasal congestion. HOME CARE INSTRUCTIONS   Take medicines only as directed by your health care provider.   Gargle warm saltwater or take cough drops to comfort your throat as directed by your health care provider.  Use a warm mist humidifier or inhale steam from a shower to increase air moisture. This may make it easier to breathe.  Drink enough fluid to keep your urine clear or pale yellow.   Eat soups and other clear broths and maintain good nutrition.   Rest as needed.   Return to work when your temperature has returned to normal or as your health care provider advises. You may need to stay home longer to avoid infecting others. You can also use a face mask and careful hand washing to prevent spread of the virus.  Increase the usage of your inhaler if you have asthma.   Do not use any tobacco products, including cigarettes, chewing tobacco, or electronic cigarettes. If you need help quitting, ask your health care provider. PREVENTION  The best way to protect yourself from getting a cold is to practice good hygiene.   Avoid oral or hand contact with people with cold  symptoms.   Wash your hands often if contact occurs.  There is no clear evidence that vitamin C, vitamin E, echinacea, or exercise reduces the chance of developing a cold. However, it is always recommended to get plenty of rest, exercise, and practice good nutrition.  SEEK MEDICAL CARE IF:   You are getting worse rather than better.   Your symptoms are not controlled by medicine.   You have chills.  You have worsening shortness of breath.  You have brown or red mucus.  You have yellow or brown nasal  discharge.  You have pain in your face, especially when you bend forward.  You have a fever.  You have swollen neck glands.  You have pain while swallowing.  You have white areas in the back of your throat. SEEK IMMEDIATE MEDICAL CARE IF:   You have severe or persistent:  Headache.  Ear pain.  Sinus pain.  Chest pain.  You have chronic lung disease and any of the following:  Wheezing.  Prolonged cough.  Coughing up blood.  A change in your usual mucus.  You have a stiff neck.  You have changes in your:  Vision.  Hearing.  Thinking.  Mood. MAKE SURE YOU:   Understand these instructions.  Will watch your condition.  Will get help right away if you are not doing well or get worse.   This information is not intended to replace advice given to you by your health care provider. Make sure you discuss any questions you have with your health care provider.   Take antibiotics as prescribed. Use albuterol inhaler as needed for shortness of breath. ENCOURAGE SMOKING CESSATION. Take steroids as prescribed. Follow up with your primary care doctor if your symptoms do not improve. Return to the ED if you experience severe worsening of your symptoms, chest pain, difficulty breathing, fevers, chills.

## 2015-08-06 NOTE — ED Provider Notes (Signed)
CSN: [id number]     Arrival date & time 08/06/15  1557 History   First MD Initiated Contact with Patient 08/06/15 1623     Chief Complaint  Patient presents with  . Wheezing  . Shortness of Breath     (Consider location/radiation/quality/duration/timing/severity/associated sxs/prior Treatment) HPI   Alisha Espinoza is a 50 y.o F with a pmhx of bronchitis who presents to the ED today c/o cough and SOB. Pt states that 3 days ago She was using her nephew who had a cold that she develops a cough with yellow sputum. She subsequently developed wheezing and shortness of breath. She states her dyspnea is increased with exertion but she is also dyspneic at rest. She also reports nasal congestion and rhinorrhea. She denies any fevers, chills, chest pain, lower extremity swelling. She has not tried taking anything for her symptoms. Patient is a current every day smoker.   Past Medical History  Diagnosis Date  . Bronchitis   . Heart murmur     "closed by age 3."   Past Surgical History  Procedure Laterality Date  . Rotator cuff repair      Right  . Cystectomy      L wrist  . Tympanostomy tube placement    . Anterior cervical decomp/discectomy fusion  03/19/2012    Procedure: ANTERIOR CERVICAL DECOMPRESSION/DISCECTOMY FUSION 1 LEVEL;  Surgeon: Melina Schools, MD;  Location: [place];  Service: Orthopedics;  Laterality: Left;  Total Disc Replacement C4-5  . Cervical fusion  05/07/2012    Dr Rolena Infante  . Anterior cervical decomp/discectomy fusion N/A 05/07/2012    Procedure: ANTERIOR CERVICAL DECOMPRESSION/DISCECTOMY FUSION 1 LEVEL/HARDWARE REMOVAL;  Surgeon: Melina Schools, MD;  Location: [place];  Service: Orthopedics;  Laterality: N/A;  REMOVAL OF CERVICAL DISC REPLACEMENT AND ACDF C4-5  . Abdominal hysterectomy  2000   Family History  Problem Relation Age of Onset  . Diabetes Mother   . Hypertension Father    Social History  Substance Use Topics  . Smoking status: Current Every Day Smoker -- 0.50  packs/day for 20 years    Types: Cigarettes  . Smokeless tobacco: Never Used  . Alcohol Use: Yes     Comment: occasional   OB History    No data available     Review of Systems  All other systems reviewed and are negative.     Allergies  Meloxicam  Home Medications   Prior to Admission medications   Medication Sig Start Date End Date Taking? Authorizing Provider  albuterol (PROVENTIL HFA;VENTOLIN HFA) 108 (90 BASE) MCG/ACT inhaler Inhale 1-2 puffs into the lungs every 6 (six) hours as needed for wheezing or shortness of breath. 02/24/14  Yes Evelina Bucy, MD  aspirin 325 MG tablet Take 325 mg by mouth every 6 (six) hours as needed for mild pain.    Yes Historical Provider, MD  ibuprofen (ADVIL,MOTRIN) 600 MG tablet Take 1 tablet (600 mg total) by mouth every 6 (six) hours as needed. 08/19/14  Yes Julianne Rice, MD  Multiple Vitamin (MULTIVITAMIN WITH MINERALS) TABS tablet Take 1 tablet by mouth daily.   Yes Historical Provider, MD  azithromycin (ZITHROMAX) 250 MG tablet Take 1 tablet (250 mg total) by mouth daily. Take 1 tablet daily until finished. Patient not taking: Reported on 08/19/2014 02/25/14   Evelina Bucy, MD  brompheniramine-pseudoephedrine-DM 30-2-10 MG/5ML syrup Take 5 mLs by mouth 3 (three) times daily as needed. Patient not taking: Reported on 09/26/2014 08/27/14   Etta Quill, NP  cephALEXin [facility])  500 MG capsule Take 2 capsules (1,000 mg total) by mouth 2 (two) times daily. 09/26/14   Domenic Moras, PA-C  doxycycline (VIBRAMYCIN) 100 MG capsule Take 1 capsule (100 mg total) by mouth 2 (two) times daily. Patient not taking: Reported on 09/26/2014 08/23/14   Billy Fischer, MD  methocarbamol (ROBAXIN) 500 MG tablet Take 2 tablets (1,000 mg total) by mouth every 8 (eight) hours as needed for muscle spasms. 08/19/14   Julianne Rice, MD  oxymetazoline (AFRIN NASAL SPRAY) 0.05 % nasal spray Place 2 sprays into both nostrils 2 (two) times daily. X 2-3 days. DO NOT TAKE FOR  LONGER THAN 3 DAYS! Patient not taking: Reported on 02/24/2014 02/05/14   Mercedes Camprubi-Soms, PA-C  Phenylephrine-DM-GG-APAP 5-10-200-325 MG TABS Take 2 tablets by mouth 3 (three) times daily. Patient not taking: Reported on 09/26/2014 08/24/14   Britt Bottom, NP  predniSONE (DELTASONE) 20 MG tablet 3 tabs po day one, then 2 tabs daily x 4 days Patient not taking: Reported on 09/26/2014 08/27/14   Etta Quill, NP   BP 188/101 mmHg  Pulse 105  Temp(Src) 98.1 F (36.7 C) (Oral)  Resp 20  SpO2 94% Physical Exam  Constitutional: She is oriented to person, place, and time. She appears well-developed and well-nourished. No distress.  HENT:  Head: Normocephalic and atraumatic.  Mouth/Throat: No oropharyngeal exudate.  Eyes: Conjunctivae and EOM are normal. Pupils are equal, round, and reactive to light. Right eye exhibits no discharge. Left eye exhibits no discharge. No scleral icterus.  Cardiovascular: Normal rate, regular rhythm, normal heart sounds and intact distal pulses.  Exam reveals no gallop and no friction rub.   No murmur heard. Pulmonary/Chest: Effort normal. No respiratory distress. She has wheezes ( inspiratory and expiratory wheezes in all lung fields). She has no rales. She exhibits no tenderness.  Rhonchi heard in RLL   Abdominal: Soft. She exhibits no distension. There is no tenderness. There is no guarding.  Musculoskeletal: Normal range of motion. She exhibits no edema.  Neurological: She is alert and oriented to person, place, and time.  Skin: Skin is warm and dry. No rash noted. She is not diaphoretic. No erythema. No pallor.  Psychiatric: She has a normal mood and affect. Her behavior is normal.  Nursing note and vitals reviewed.   ED Course  Procedures (including critical care time) Labs Review Labs Reviewed  BASIC METABOLIC PANEL - Abnormal; Notable for the following:    Glucose, Bld 112 (*)    All other components within normal limits  CBC WITH  DIFFERENTIAL/PLATELET - Abnormal; Notable for the following:    RBC 5.19 (*)    RDW 15.7 (*)    All other components within normal limits  BRAIN NATRIURETIC PEPTIDE  I-STAT TROPOININ, ED    Imaging Review Dg Chest 2 View  08/06/2015  CLINICAL DATA:  Cough and wheezing and shortness of breath for 2 days. EXAM: CHEST  2 VIEW COMPARISON:  08/26/2014: FINDINGS: The cardiac silhouette, mediastinal and hilar contours are within normal limits and stable. There are chronic bronchitic changes, likely related to smoking. Could not exclude superimposed bronchitis. Streaky right basilar atelectasis but no infiltrates, effusions or edema. The bony thorax is intact. IMPRESSION: Chronic bronchitic changes, likely related to smoking. Could not exclude superimposed acute bronchitis. Minimal streaky right basilar atelectasis. No infiltrates or effusions. Electronically Signed   By: Marijo Sanes M.D.   On: 08/06/2015 17:09   I have personally reviewed and evaluated these images and lab results as  part of my medical decision-making.   EKG Interpretation   Date/Time:  Sunday Aug 06 2015 16:33:43 EDT Ventricular Rate:  97 PR Interval:  127 QRS Duration: 77 QT Interval:  342 QTC Calculation: 434 R Axis:   91 Text Interpretation:  Sinus rhythm Anterior infarct, old Borderline  repolarization abnormality No significant change since last tracing  Confirmed by ALLEN  MD, ANTHONY ([id number]) on 08/06/2015 6:38:02 PM      MDM   Final diagnoses:  Bronchitis   50 year old femaleWith a past medical history of bronchitis, tobacco use presents to the ED today complaining of cough, wheezing and shortness of breath onset 2 days ago. Upon exam, patient has diffuse wheezes in all lung fields and rhonchi heard in her right lower lobe. Wheezes are audible. She is afebrile and is otherwise nontoxic, nonseptic appearing. Patient was given 1 DuoNeb without relief of her symptoms. She was then given a continuous nebulizer and 10  mg IV Decadron. No leukocytosis present. Patient does not meet SIRS or sepsis criteria. Upon repeat examination patient reports significant symptomatic improvement. She does still have remaining wheezes on lung exam but it is much improved. Patient states she does not want to stay in the hospital and wanted to go home. She was ambulated with pulse ox while in the ED and maintained her O2 saturation above 94%. Chest x-ray reveals chronic bronchitic changes are clearly related to smoking. Minimal streaky right basilar atelectasis present. No infiltrates noted. Given smoking history will give azithromycin to cover atypicals. We'll also give patient albuterol inhaler and steroid burst. She will follow up with her PCP next week. Strict return precautions given and discussed.  Case discussed with Dr. Zenia Resides who agrees with the treatment plan.  Dondra Spry [place], PA-C 08/06/15 2045  Lacretia Leigh, MD 08/13/15 [phone]

## 2015-08-06 NOTE — ED Notes (Signed)
C/o cough/wheezing, and feeling SOB x 2 days. Dry cough observed in triage, appears to be breathing heavily after only walking to the bathroom. Loud expiratory wheezing in all lung lobes, with right lower lobe rhonchi. No fever in triage.

## 2015-08-06 NOTE — ED Notes (Signed)
Pt O2 stayed between 92%-94% while walking

## 2015-08-08 ENCOUNTER — Inpatient Hospital Stay (HOSPITAL_COMMUNITY)
Admission: EM | Admit: 2015-08-08 | Discharge: 2015-08-12 | DRG: 192 | Disposition: A | Discharge status: Home / Self Care | Payer: BLUE CROSS/BLUE SHIELD | Attending: Internal Medicine | Admitting: Internal Medicine

## 2015-08-08 ENCOUNTER — Encounter (HOSPITAL_COMMUNITY): Payer: Self-pay | Admitting: Emergency Medicine

## 2015-08-08 DIAGNOSIS — I10 Essential (primary) hypertension: Secondary | ICD-10-CM | POA: Diagnosis present

## 2015-08-08 DIAGNOSIS — Z981 Arthrodesis status: Secondary | ICD-10-CM

## 2015-08-08 DIAGNOSIS — K5900 Constipation, unspecified: Secondary | ICD-10-CM | POA: Diagnosis present

## 2015-08-08 DIAGNOSIS — R739 Hyperglycemia, unspecified: Secondary | ICD-10-CM | POA: Diagnosis not present

## 2015-08-08 DIAGNOSIS — J209 Acute bronchitis, unspecified: Secondary | ICD-10-CM | POA: Diagnosis not present

## 2015-08-08 DIAGNOSIS — E1165 Type 2 diabetes mellitus with hyperglycemia: Secondary | ICD-10-CM | POA: Diagnosis present

## 2015-08-08 DIAGNOSIS — D72829 Elevated white blood cell count, unspecified: Secondary | ICD-10-CM | POA: Diagnosis present

## 2015-08-08 DIAGNOSIS — F17210 Nicotine dependence, cigarettes, uncomplicated: Secondary | ICD-10-CM | POA: Diagnosis present

## 2015-08-08 DIAGNOSIS — Z888 Allergy status to other drugs, medicaments and biological substances status: Secondary | ICD-10-CM

## 2015-08-08 DIAGNOSIS — T380X5A Adverse effect of glucocorticoids and synthetic analogues, initial encounter: Secondary | ICD-10-CM | POA: Diagnosis present

## 2015-08-08 DIAGNOSIS — Z7952 Long term (current) use of systemic steroids: Secondary | ICD-10-CM

## 2015-08-08 DIAGNOSIS — Z7951 Long term (current) use of inhaled steroids: Secondary | ICD-10-CM

## 2015-08-08 DIAGNOSIS — Z7982 Long term (current) use of aspirin: Secondary | ICD-10-CM

## 2015-08-08 DIAGNOSIS — Z720 Tobacco use: Secondary | ICD-10-CM | POA: Diagnosis not present

## 2015-08-08 DIAGNOSIS — J440 Chronic obstructive pulmonary disease with acute lower respiratory infection: Principal | ICD-10-CM | POA: Diagnosis present

## 2015-08-08 DIAGNOSIS — Z79899 Other long term (current) drug therapy: Secondary | ICD-10-CM

## 2015-08-08 DIAGNOSIS — J40 Bronchitis, not specified as acute or chronic: Secondary | ICD-10-CM

## 2015-08-08 DIAGNOSIS — J441 Chronic obstructive pulmonary disease with (acute) exacerbation: Secondary | ICD-10-CM | POA: Diagnosis present

## 2015-08-08 LAB — COMPREHENSIVE METABOLIC PANEL
ALT: 39 U/L (ref 14–54)
AST: 30 U/L (ref 15–41)
Albumin: 4.2 g/dL (ref 3.5–5.0)
Alkaline Phosphatase: 85 U/L (ref 38–126)
Anion gap: 10 (ref 5–15)
BUN: 12 mg/dL (ref 6–20)
CO2: 23 mmol/L (ref 22–32)
Calcium: 9.5 mg/dL (ref 8.9–10.3)
Chloride: 107 mmol/L (ref 101–111)
Creatinine, Ser: 0.81 mg/dL (ref 0.44–1.00)
GFR calc Af Amer: >60 mL/min (ref >60)
GFR calc non Af Amer: >60 mL/min (ref >60)
Glucose, Bld: 207 mg/dL — ABNORMAL HIGH (ref 65–99)
Potassium: 4.3 mmol/L (ref 3.5–5.1)
Sodium: 140 mmol/L (ref 135–145)
Total Bilirubin: 0.5 mg/dL (ref 0.3–1.2)
Total Protein: 7.5 g/dL (ref 6.5–8.1)

## 2015-08-08 LAB — URINALYSIS, ROUTINE W REFLEX MICROSCOPIC
Bilirubin Urine: NEGATIVE
Glucose, UA: 500 mg/dL — AB
Hgb urine dipstick: NEGATIVE
Ketones, ur: NEGATIVE mg/dL
Leukocytes, UA: NEGATIVE
Nitrite: NEGATIVE
Protein, ur: NEGATIVE mg/dL
Specific Gravity, Urine: 1.024 (ref 1.005–1.030)
pH: 7 (ref 5.0–8.0)

## 2015-08-08 LAB — CBC
HCT: 42.3 % (ref 36.0–46.0)
Hemoglobin: 14.2 g/dL (ref 12.0–15.0)
MCH: 26.8 pg (ref 26.0–34.0)
MCHC: 33.6 g/dL (ref 30.0–36.0)
MCV: 79.8 fL (ref 78.0–100.0)
Platelets: 252 10*3/uL (ref 150–400)
RBC: 5.3 MIL/uL — ABNORMAL HIGH (ref 3.87–5.11)
RDW: 15.9 % — ABNORMAL HIGH (ref 11.5–15.5)
WBC: 15.8 10*3/uL — ABNORMAL HIGH (ref 4.0–10.5)

## 2015-08-08 LAB — LIPASE, BLOOD: Lipase: 20 U/L (ref 11–51)

## 2015-08-08 MED ORDER — OXYCODONE-ACETAMINOPHEN 5-325 MG PO TABS: 1.0000 | ORAL_TABLET | Freq: Once | ORAL | Status: DC

## 2015-08-08 MED ORDER — NICOTINE 21 MG/24HR TD PT24
21.0000 mg | MEDICATED_PATCH | Freq: Every day | TRANSDERMAL | Status: DC
  Administered 2015-08-09 – 2015-08-12 (×4): 21 mg via TRANSDERMAL
  Filled 2015-08-08 (×4): qty 1

## 2015-08-08 MED ORDER — ALBUTEROL SULFATE (2.5 MG/3ML) 0.083% IN NEBU
2.5000 mg | INHALATION_SOLUTION | Freq: Once | RESPIRATORY_TRACT | Status: AC
  Administered 2015-08-08: 2.5 mg via RESPIRATORY_TRACT
  Filled 2015-08-08: qty 3

## 2015-08-08 MED ORDER — AZITHROMYCIN 250 MG PO TABS
250.0000 mg | ORAL_TABLET | Freq: Every day | ORAL | Status: AC
  Administered 2015-08-09 – 2015-08-12 (×4): 250 mg via ORAL
  Filled 2015-08-08 (×4): qty 1

## 2015-08-08 MED ORDER — IPRATROPIUM-ALBUTEROL 0.5-2.5 (3) MG/3ML IN SOLN
3.0000 mL | Freq: Once | RESPIRATORY_TRACT | Status: AC
  Administered 2015-08-08: 3 mL via RESPIRATORY_TRACT
  Filled 2015-08-08: qty 3

## 2015-08-08 MED ORDER — METHYLPREDNISOLONE SODIUM SUCC 125 MG IJ SOLR
125.0000 mg | INTRAMUSCULAR | Status: AC
  Administered 2015-08-08: 125 mg via INTRAVENOUS
  Filled 2015-08-08: qty 2

## 2015-08-08 MED ORDER — BENZONATATE 100 MG PO CAPS
200.0000 mg | ORAL_CAPSULE | Freq: Three times a day (TID) | ORAL | Status: DC | PRN
  Administered 2015-08-09 – 2015-08-10 (×3): 200 mg via ORAL
  Filled 2015-08-08 (×3): qty 2

## 2015-08-08 MED ORDER — IPRATROPIUM-ALBUTEROL 0.5-2.5 (3) MG/3ML IN SOLN
3.0000 mL | Freq: Once | RESPIRATORY_TRACT | Status: DC
Start: 2015-08-08 — End: 2015-08-08
  Filled 2015-08-08: qty 3

## 2015-08-08 MED ORDER — ONDANSETRON HCL 4 MG/2ML IJ SOLN: 4.0000 mg | Freq: Four times a day (QID) | INTRAMUSCULAR | Status: DC | PRN

## 2015-08-08 MED ORDER — PREDNISONE 20 MG PO TABS
40.0000 mg | ORAL_TABLET | Freq: Every day | ORAL | Status: DC
  Administered 2015-08-09: 40 mg via ORAL
  Filled 2015-08-08 (×2): qty 2

## 2015-08-08 MED ORDER — FLEET ENEMA 7-19 GM/118ML RE ENEM
1.0000 | ENEMA | Freq: Once | RECTAL | Status: AC
  Administered 2015-08-08: 1 via RECTAL
  Filled 2015-08-08: qty 1

## 2015-08-08 MED ORDER — GUAIFENESIN ER 600 MG PO TB12
600.0000 mg | ORAL_TABLET | Freq: Two times a day (BID) | ORAL | Status: DC
  Administered 2015-08-08 – 2015-08-12 (×8): 600 mg via ORAL
  Filled 2015-08-08 (×9): qty 1

## 2015-08-08 MED ORDER — ACETAMINOPHEN 325 MG PO TABS
650.0000 mg | ORAL_TABLET | Freq: Four times a day (QID) | ORAL | Status: DC | PRN
  Administered 2015-08-09: 650 mg via ORAL
  Filled 2015-08-08: qty 2

## 2015-08-08 MED ORDER — AZITHROMYCIN 250 MG PO TABS
500.0000 mg | ORAL_TABLET | Freq: Once | ORAL | Status: AC
  Administered 2015-08-08: 500 mg via ORAL
  Filled 2015-08-08: qty 2

## 2015-08-08 MED ORDER — IPRATROPIUM-ALBUTEROL 0.5-2.5 (3) MG/3ML IN SOLN
3.0000 mL | Freq: Once | RESPIRATORY_TRACT | Status: AC
  Administered 2015-08-08: 3 mL via RESPIRATORY_TRACT

## 2015-08-08 MED ORDER — ENOXAPARIN SODIUM 40 MG/0.4ML ~~LOC~~ SOLN
40.0000 mg | Freq: Every day | SUBCUTANEOUS | Status: DC
  Administered 2015-08-08 – 2015-08-11 (×4): 40 mg via SUBCUTANEOUS
  Filled 2015-08-08 (×5): qty 0.4

## 2015-08-08 MED ORDER — IPRATROPIUM BROMIDE 0.02 % IN SOLN
0.5000 mg | RESPIRATORY_TRACT | Status: DC | PRN
  Administered 2015-08-09: 0.5 mg via RESPIRATORY_TRACT
  Filled 2015-08-08: qty 2.5

## 2015-08-08 MED ORDER — ONDANSETRON HCL 4 MG PO TABS: 4.0000 mg | ORAL_TABLET | Freq: Four times a day (QID) | ORAL | Status: DC | PRN

## 2015-08-08 MED ORDER — ALBUTEROL SULFATE (2.5 MG/3ML) 0.083% IN NEBU
2.5000 mg | INHALATION_SOLUTION | RESPIRATORY_TRACT | Status: DC | PRN
  Administered 2015-08-09 – 2015-08-10 (×2): 2.5 mg via RESPIRATORY_TRACT
  Filled 2015-08-08 (×2): qty 3

## 2015-08-08 MED ORDER — ACETAMINOPHEN 650 MG RE SUPP: 650.0000 mg | Freq: Four times a day (QID) | RECTAL | Status: DC | PRN

## 2015-08-08 NOTE — ED Notes (Signed)
Pt states that she was here yesterday and dx with bronchitis.  States that the meds aren't working and now she thinks she is constipated.  C/o gen abd pain.

## 2015-08-08 NOTE — ED Provider Notes (Signed)
CSN: [id number]     Arrival date & time 08/08/15  1552 History   First MD Initiated Contact with Patient 08/08/15 1803     Chief Complaint  Patient presents with  . Bronchitis  . Abdominal Pain   HPI Comments: 50 year old female who presents for worsening SOB and productive cough for the past 5 days. She has never been diagnosed with COPD however recent CXR suggest chronic bronchitic changes. PMH significant for tobacco abuse. She was recently evaluated here for the same thing 2 days ago. Xray at the time showed acute on chronic bronchtis, no PNA. She had a duoneb treatment followed by continuous neb and IV Decadron. She was given an albuterol inhaler here and rx for Azithromycin and Prednisone. She states she has been taking the Prednisone 50mg  but she states the albuterol inhaler has not helped and she did not pick up the Azithromycin due to cost. She is still smoking however states she has cut back and is smoking 2 a day now. Reports associated L ear pain, rhinorrhea. Denies fever, chills, chest pain, abdominal pain, N/V/D. She reports she also has been constipated. She has not had a bowel movement in 3 days and has been taking laxative without relief. She has had her flu shot this year.      Past Medical History  Diagnosis Date  . Bronchitis   . Heart murmur     "closed by age 1."   Past Surgical History  Procedure Laterality Date  . Rotator cuff repair      Right  . Cystectomy      L wrist  . Tympanostomy tube placement    . Anterior cervical decomp/discectomy fusion  03/19/2012    Procedure: ANTERIOR CERVICAL DECOMPRESSION/DISCECTOMY FUSION 1 LEVEL;  Surgeon: Melina Schools, MD;  Location: [place];  Service: Orthopedics;  Laterality: Left;  Total Disc Replacement C4-5  . Cervical fusion  05/07/2012    Dr Rolena Infante  . Anterior cervical decomp/discectomy fusion N/A 05/07/2012    Procedure: ANTERIOR CERVICAL DECOMPRESSION/DISCECTOMY FUSION 1 LEVEL/HARDWARE REMOVAL;  Surgeon: Melina Schools,  MD;  Location: [place];  Service: Orthopedics;  Laterality: N/A;  REMOVAL OF CERVICAL DISC REPLACEMENT AND ACDF C4-5  . Abdominal hysterectomy  2000   Family History  Problem Relation Age of Onset  . Diabetes Mother   . Hypertension Father    Social History  Substance Use Topics  . Smoking status: Current Every Day Smoker -- 0.50 packs/day for 20 years    Types: Cigarettes  . Smokeless tobacco: Never Used  . Alcohol Use: Yes     Comment: occasional   OB History    No data available     Review of Systems  HENT: Positive for congestion, ear pain and rhinorrhea. Negative for sneezing.   Respiratory: Positive for wheezing. Negative for apnea, choking, chest tightness and stridor.   Gastrointestinal: Positive for constipation. Negative for nausea, vomiting, abdominal pain, diarrhea and abdominal distention.  Genitourinary: Negative for dysuria.  All other systems reviewed and are negative.   Allergies  Meloxicam  Home Medications   Prior to Admission medications   Medication Sig Start Date End Date Taking? Authorizing Provider  albuterol (PROVENTIL HFA;VENTOLIN HFA) 108 (90 BASE) MCG/ACT inhaler Inhale 1-2 puffs into the lungs every 6 (six) hours as needed for wheezing or shortness of breath. Patient not taking: Reported on 08/08/2015 02/24/14   Evelina Bucy, MD  aspirin 325 MG tablet Take 325 mg by mouth every 6 (six) hours as  needed for mild pain.     Historical Provider, MD  azithromycin (ZITHROMAX) 250 MG tablet Take 1 tablet (250 mg total) by mouth daily. Take 1 tablet daily until finished. Patient not taking: Reported on 08/19/2014 02/25/14   Evelina Bucy, MD  azithromycin (ZITHROMAX) 250 MG tablet Take 1 tablet (250 mg total) by mouth daily. Take first 2 tablets together, then 1 every day until finished. 08/06/15   Samantha Tripp Dowless, PA-C  brompheniramine-pseudoephedrine-DM 30-2-10 MG/5ML syrup Take 5 mLs by mouth 3 (three) times daily as needed. Patient not taking: Reported  on 09/26/2014 08/27/14   Etta Quill, NP  cephALEXin (KEFLEX) 500 MG capsule Take 2 capsules (1,000 mg total) by mouth 2 (two) times daily. Patient not taking: Reported on 08/08/2015 09/26/14   Domenic Moras, PA-C  doxycycline (VIBRAMYCIN) 100 MG capsule Take 1 capsule (100 mg total) by mouth 2 (two) times daily. Patient not taking: Reported on 09/26/2014 08/23/14   Billy Fischer, MD  ibuprofen (ADVIL,MOTRIN) 600 MG tablet Take 1 tablet (600 mg total) by mouth every 6 (six) hours as needed. Patient not taking: Reported on 08/08/2015 08/19/14   Julianne Rice, MD  methocarbamol (ROBAXIN) 500 MG tablet Take 2 tablets (1,000 mg total) by mouth every 8 (eight) hours as needed for muscle spasms. Patient not taking: Reported on 08/08/2015 08/19/14   Julianne Rice, MD  oxymetazoline [facility] NASAL SPRAY) 0.05 % nasal spray Place 2 sprays into both nostrils 2 (two) times daily. X 2-3 days. DO NOT TAKE FOR LONGER THAN 3 DAYS! Patient not taking: Reported on 02/24/2014 02/05/14   Mercedes Camprubi-Soms, PA-C  Phenylephrine-DM-GG-APAP 5-10-200-325 MG TABS Take 2 tablets by mouth 3 (three) times daily. Patient not taking: Reported on 09/26/2014 08/24/14   Britt Bottom, NP  predniSONE (DELTASONE) 20 MG tablet 3 tabs po day one, then 2 tabs daily x 4 days Patient not taking: Reported on 09/26/2014 08/27/14   Etta Quill, NP  predniSONE (DELTASONE) 50 MG tablet Take 1 pill daily for 5 days Patient not taking: Reported on 08/08/2015 08/06/15   Samantha Tripp Dowless, PA-C   BP 151/98 mmHg  Pulse 86  Temp(Src) 98.1 F (36.7 C) (Oral)  Resp 20  SpO2 98%   Physical Exam  Constitutional: She is oriented to person, place, and time. She appears well-developed and well-nourished. No distress.  HENT:  Head: Normocephalic and atraumatic.  Right Ear: Hearing, tympanic membrane, external ear and ear canal normal.  Left Ear: Hearing, external ear and ear canal normal. No tenderness. Tympanic membrane is erythematous.  Eyes:  Conjunctivae are normal. Pupils are equal, round, and reactive to light. Right eye exhibits no discharge. Left eye exhibits no discharge. No scleral icterus.  Neck: Normal range of motion.  Cardiovascular: Normal rate and regular rhythm.  Exam reveals no gallop and no friction rub.   No murmur heard. Pulmonary/Chest: Effort normal. No respiratory distress. She has wheezes. She has rhonchi. She has no rales. She exhibits no tenderness.  Diffuse wheezes and rhonchi in all lung fields  Abdominal: Soft. Bowel sounds are normal. She exhibits no distension and no mass. There is tenderness. There is no rebound and no guarding.  Minimal generalized tenderness  Neurological: She is alert and oriented to person, place, and time.  Skin: Skin is warm and dry.  Psychiatric: She has a normal mood and affect.    ED Course  Procedures (including critical care time) Labs Review Labs Reviewed  COMPREHENSIVE METABOLIC PANEL - Abnormal; Notable for the following:  Glucose, Bld 207 (*)    All other components within normal limits  CBC - Abnormal; Notable for the following:    WBC 15.8 (*)    RBC 5.30 (*)    RDW 15.9 (*)    All other components within normal limits  URINALYSIS, ROUTINE W REFLEX MICROSCOPIC (NOT AT [facility]) - Abnormal; Notable for the following:    APPearance CLOUDY (*)    Glucose, UA 500 (*)    All other components within normal limits  LIPASE, BLOOD    Imaging Review No results found. I have personally reviewed and evaluated these images and lab results as part of my medical decision-making.   EKG Interpretation None      MDM   Final diagnoses:  Bronchitis  Constipation, unspecified constipation type   50 year old female who presents with worsening SOB, cough, and constipation. She is afebile and not hypoxic with normal respirations. She is tachycardic at times and slightly hypertensive. PE reveals diffuse rhonchi and wheezes. Duoneb x 2 given followed by albuterol neb x1  with minimal improvement. No steroids given since she has already had 50mg  of Prednisone today. CBC shows worsening leukocytosis and BMP shows hyperglycemia. Possible this is from the steroids she has been on vs worsening infection. Azithromycin given. Patient is also requesting something for her constipation. Enema ordered.   Shared visit with Dr. Roderic Palau. Dr. Tamala Julian consulted for admission due to minimal improvement in the ED.    Recardo Evangelist, PA-C 08/09/15 1357  Milton Ferguson, MD 08/09/15 [phone]

## 2015-08-08 NOTE — ED Notes (Signed)
Pt has ambulated to restroom.

## 2015-08-08 NOTE — H&P (Addendum)
History and Physical    Alisha Espinoza [id number] DOB: 06-10-1965 DOA: 08/08/2015  Referring MD/NP/PA: Dr. Roderic Palau PCP: Pcp Not In System Outpatient Specialists: None Patient coming from: Home  Chief Complaint: Shortness of breath  and cough  HPI: Alisha Espinoza is a 50 y.o. female with medical history significant of bronchitis and tobacco abuse; who presents with continued cough and shortness of breath. Symptoms started approximately 5 days ago, after she had been in contacted with one of her nephews who was sick with a cold. She reports shortly thereafter developing a cough and wheezing. Cough was productive with intermittent thick yellowish sputum. Associated symptoms include wheezing, shortness of breath with exertion, rhinorrhea, and left ear discomfort. Denies any fever, chills, nausea, vomiting, abdominal pain diarrhea, headache, or myalgias. Seen in the emergency department 3 days ago, diagnosed at that time with bronchitis and given a Z-Pak, Proventil inhaler, and steroids. The patient picked up the steroids and utilize the inhaler, but did not fill the antibiotics. Reasoning behind not fill the antibiotic prescription is that she is out of work. Despite using the medications that she did receive she still complains of persistent coughing keeping her up at night and feelings that she cannot breathe. Denies trying any other therapies. Patient currently still reports smoking cigarettes although she states she's cut back to only 2 cigarettes a day. She admits it is hard for her to quit and like to have a nicotine patch here in the hospital.  ED Course: Upon admission into the emergency room patient was evaluated and seen to be afebrile with mild tachycardia, O2 saturations maintained. Lab work revealed WBC 15.8, glucose 207, and all other labs within normal limits. Chest x-ray showed chronic bronchitic changes likely related to smoking.  Review of Systems: As per HPI otherwise 10 point review of  systems negative.    Past Medical History  Diagnosis Date  . Bronchitis   . Heart murmur     "closed by age 38."    Past Surgical History  Procedure Laterality Date  . Rotator cuff repair      Right  . Cystectomy      L wrist  . Tympanostomy tube placement    . Anterior cervical decomp/discectomy fusion  03/19/2012    Procedure: ANTERIOR CERVICAL DECOMPRESSION/DISCECTOMY FUSION 1 LEVEL;  Surgeon: Melina Schools, MD;  Location: [place] [facility];  Service: Orthopedics;  Laterality: Left;  Total Disc Replacement C4-5  . Cervical fusion  05/07/2012    Dr Rolena Infante  . Anterior cervical decomp/discectomy fusion N/A 05/07/2012    Procedure: ANTERIOR CERVICAL DECOMPRESSION/DISCECTOMY FUSION 1 LEVEL/HARDWARE REMOVAL;  Surgeon: Melina Schools, MD;  Location: [place] [facility];  Service: Orthopedics;  Laterality: N/A;  REMOVAL OF CERVICAL DISC REPLACEMENT AND ACDF C4-5  . Abdominal hysterectomy  2000     reports that she has been smoking Cigarettes.  She has a 10 pack-year smoking history. She has never used smokeless tobacco. She reports that she drinks alcohol. She reports that she does not use illicit drugs.  Allergies  Allergen Reactions  . Meloxicam Hives    Family History  Problem Relation Age of Onset  . Diabetes Mother   . Hypertension Father     Prior to Admission medications   Medication Sig Start Date End Date Taking? Authorizing Provider  albuterol (PROVENTIL HFA;VENTOLIN HFA) 108 (90 BASE) MCG/ACT inhaler Inhale 1-2 puffs into the lungs every 6 (six) hours as needed for wheezing or shortness of breath. Patient not taking: Reported on 08/08/2015 02/24/14  Evelina Bucy, MD  aspirin 325 MG tablet Take 325 mg by mouth every 6 (six) hours as needed for mild pain.     Historical Provider, MD  azithromycin (ZITHROMAX) 250 MG tablet Take 1 tablet (250 mg total) by mouth daily. Take 1 tablet daily until finished. Patient not taking: Reported on 08/19/2014 02/25/14   Evelina Bucy, MD  azithromycin (ZITHROMAX)  250 MG tablet Take 1 tablet (250 mg total) by mouth daily. Take first 2 tablets together, then 1 every day until finished. 08/06/15   Samantha Tripp Dowless, PA-C  brompheniramine-pseudoephedrine-DM 30-2-10 MG/5ML syrup Take 5 mLs by mouth 3 (three) times daily as needed. Patient not taking: Reported on 09/26/2014 08/27/14   Etta Quill, NP  cephALEXin (KEFLEX) 500 MG capsule Take 2 capsules (1,000 mg total) by mouth 2 (two) times daily. Patient not taking: Reported on 08/08/2015 09/26/14   Domenic Moras, PA-C  doxycycline (VIBRAMYCIN) 100 MG capsule Take 1 capsule (100 mg total) by mouth 2 (two) times daily. Patient not taking: Reported on 09/26/2014 08/23/14   Billy Fischer, MD  ibuprofen (ADVIL,MOTRIN) 600 MG tablet Take 1 tablet (600 mg total) by mouth every 6 (six) hours as needed. Patient not taking: Reported on 08/08/2015 08/19/14   Julianne Rice, MD  methocarbamol (ROBAXIN) 500 MG tablet Take 2 tablets (1,000 mg total) by mouth every 8 (eight) hours as needed for muscle spasms. Patient not taking: Reported on 08/08/2015 08/19/14   Julianne Rice, MD  oxymetazoline [facility] NASAL SPRAY) 0.05 % nasal spray Place 2 sprays into both nostrils 2 (two) times daily. X 2-3 days. DO NOT TAKE FOR LONGER THAN 3 DAYS! Patient not taking: Reported on 02/24/2014 02/05/14   Mercedes Camprubi-Soms, PA-C  Phenylephrine-DM-GG-APAP 5-10-200-325 MG TABS Take 2 tablets by mouth 3 (three) times daily. Patient not taking: Reported on 09/26/2014 08/24/14   Britt Bottom, NP  predniSONE (DELTASONE) 20 MG tablet 3 tabs po day one, then 2 tabs daily x 4 days Patient not taking: Reported on 09/26/2014 08/27/14   Etta Quill, NP  predniSONE (DELTASONE) 50 MG tablet Take 1 pill daily for 5 days Patient not taking: Reported on 08/08/2015 08/06/15   Carlos Levering, PA-C    Physical Exam: Filed Vitals:   08/08/15 1609 08/08/15 1829 08/08/15 2030  BP: 154/88 151/98 155/98  Pulse: 104 86 82  Temp: 98.1 F (36.7 C) 98.1 F (36.7  C) 97.9 F (36.6 C)  TempSrc: Oral Oral Oral  Resp: 18 20 20   SpO2: 97% 98% 96%      Constitutional: Middle-aged female, who appears in some discomfort as she coughs multiple times during  the exam. Filed Vitals:   08/08/15 1609 08/08/15 1829 08/08/15 2030  BP: 154/88 151/98 155/98  Pulse: 104 86 82  Temp: 98.1 F (36.7 C) 98.1 F (36.7 C) 97.9 F (36.6 C)  TempSrc: Oral Oral Oral  Resp: 18 20 20   SpO2: 97% 98% 96%   Eyes: PERRL, lids and conjunctivae normal ENMT: Mucous membranes are moist. Posterior pharynx clear of any exudate or lesions.Normal dentition.  Neck: normal, supple, no masses, no thyromegaly Respiratory: Diffuse expiratory wheezes appreciated in both lung fields. No rhonchi or rales. Cardiovascular: Regular rate and rhythm, no murmurs / rubs / gallops. No extremity edema. 2+ pedal pulses. No carotid bruits.  Abdomen: no tenderness, no masses palpated. No hepatosplenomegaly. Bowel sounds positive.  Musculoskeletal: no clubbing / cyanosis. No joint deformity upper and lower extremities. Good ROM, no contractures. Normal muscle tone.  Skin: no  rashes, lesions, ulcers. No induration Neurologic: CN 2-12 grossly intact. Sensation intact, DTR normal. Strength 5/5 in all 4.  Psychiatric: Normal judgment and insight. Alert and oriented x 3. Normal mood.    Labs on Admission: I have personally reviewed following labs and imaging studies  CBC:  Recent Labs Lab 08/06/15 1736 08/08/15 1655  WBC 10.2 15.8*  NEUTROABS 5.9  --   HGB 13.9 14.2  HCT 41.8 42.3  MCV 80.5 79.8  PLT 228 [id number]   Basic Metabolic Panel:  Recent Labs Lab 08/06/15 1655 08/08/15 1655  NA 139 140  K 3.6 4.3  CL 106 107  CO2 24 23  GLUCOSE 112* 207*  BUN 11 12  CREATININE 0.74 0.81  CALCIUM 9.1 9.5   GFR: CrCl cannot be calculated (Unknown ideal weight.). Liver Function Tests:  Recent Labs Lab 08/08/15 1655  AST 30  ALT 39  ALKPHOS 85  BILITOT 0.5  PROT 7.5  ALBUMIN 4.2     Recent Labs Lab 08/08/15 1655  LIPASE 20   No results for input(s): AMMONIA in the last 168 hours. Coagulation Profile: No results for input(s): INR, PROTIME in the last 168 hours. Cardiac Enzymes: No results for input(s): CKTOTAL, CKMB, CKMBINDEX, TROPONINI in the last 168 hours. BNP (last 3 results) No results for input(s): PROBNP in the last 8760 hours. HbA1C: No results for input(s): HGBA1C in the last 72 hours. CBG: No results for input(s): GLUCAP in the last 168 hours. Lipid Profile: No results for input(s): CHOL, HDL, LDLCALC, TRIG, CHOLHDL, LDLDIRECT in the last 72 hours. Thyroid Function Tests: No results for input(s): TSH, T4TOTAL, FREET4, T3FREE, THYROIDAB in the last 72 hours. Anemia Panel: No results for input(s): VITAMINB12, FOLATE, FERRITIN, TIBC, IRON, RETICCTPCT in the last 72 hours. Urine analysis:    Component Value Date/Time   COLORURINE YELLOW 08/08/2015 1825   APPEARANCEUR CLOUDY* 08/08/2015 1825   LABSPEC 1.024 08/08/2015 1825   PHURINE 7.0 08/08/2015 1825   GLUCOSEU 500* 08/08/2015 1825   HGBUR NEGATIVE 08/08/2015 1825   BILIRUBINUR NEGATIVE 08/08/2015 1825   KETONESUR NEGATIVE 08/08/2015 1825   PROTEINUR NEGATIVE 08/08/2015 1825   UROBILINOGEN 0.2 07/27/2012 1213   NITRITE NEGATIVE 08/08/2015 1825   LEUKOCYTESUR NEGATIVE 08/08/2015 1825   Sepsis Labs: @LABRCNTIP (procalcitonin:4,lacticidven:4) )No results found for this or any previous visit (from the past 240 hour(s)).   Radiological Exams on Admission: No results found.  EKG: Independently reviewed.    Assessment/Plan Bronchitis:Acute. Patient will have complaints of cough and wheezing over the last 5 days. Chest x-ray showing bronchitic changes. O2 saturations maintained on the differential includes the possibility of a COPD exacerbation given patient's smoking history. Patient given 125 mg Solu-Medrol IV 1 dose and azithromycin 500 mg while in the ED. - Admit to MedSurg bed -  DuoNeb's prn SOB /wheezing - Mucinex - continue Azithromycin  - prednisone 40 mg daily - Tessalon perles prn cough - Social work consult  Leukocytosis: WBC elevated on admission this could be secondary to recent steroid use versus underlying infection could be viral versus bacterial. - Follow-up repeat CBC in a.m.  Hyperglycemia: Glucose elevated at 200 on admission likely secondary to recent steroid :  - Continue to monitor   Tobacco abuse  - Counseled patient on the need for cessation of tobacco  - NicoDerm patch offered   DVT prophylaxis: lovenox Code Status: Full Family Communication: None Disposition Plan: Likely discharge home in 1-2 days Consults called: None Admission status: Observation MedSurg bed   Dreamer Carillo  Charmayne Sheer MD [facility] Pager [phone]  If 7PM-7AM, please contact night-coverage [email or web address] Password [facility]  08/08/2015, 9:37 PM

## 2015-08-09 DIAGNOSIS — J209 Acute bronchitis, unspecified: Secondary | ICD-10-CM | POA: Diagnosis not present

## 2015-08-09 DIAGNOSIS — D72829 Elevated white blood cell count, unspecified: Secondary | ICD-10-CM

## 2015-08-09 DIAGNOSIS — R739 Hyperglycemia, unspecified: Secondary | ICD-10-CM | POA: Diagnosis not present

## 2015-08-09 LAB — BASIC METABOLIC PANEL
Anion gap: 13 (ref 5–15)
BUN: 13 mg/dL (ref 6–20)
CO2: 23 mmol/L (ref 22–32)
Calcium: 9.2 mg/dL (ref 8.9–10.3)
Chloride: 104 mmol/L (ref 101–111)
Creatinine, Ser: 0.77 mg/dL (ref 0.44–1.00)
GFR calc Af Amer: >60 mL/min (ref >60)
GFR calc non Af Amer: >60 mL/min (ref >60)
Glucose, Bld: 250 mg/dL — ABNORMAL HIGH (ref 65–99)
Potassium: 4.1 mmol/L (ref 3.5–5.1)
Sodium: 140 mmol/L (ref 135–145)

## 2015-08-09 LAB — CBC
HCT: 41.9 % (ref 36.0–46.0)
Hemoglobin: 14 g/dL (ref 12.0–15.0)
MCH: 26.6 pg (ref 26.0–34.0)
MCHC: 33.4 g/dL (ref 30.0–36.0)
MCV: 79.5 fL (ref 78.0–100.0)
Platelets: 263 10*3/uL (ref 150–400)
RBC: 5.27 MIL/uL — ABNORMAL HIGH (ref 3.87–5.11)
RDW: 15.8 % — ABNORMAL HIGH (ref 11.5–15.5)
WBC: 13.7 10*3/uL — ABNORMAL HIGH (ref 4.0–10.5)

## 2015-08-09 LAB — GLUCOSE, CAPILLARY: Glucose-Capillary: 157 mg/dL — ABNORMAL HIGH (ref 65–99)

## 2015-08-09 MED ORDER — METHYLPREDNISOLONE SODIUM SUCC 40 MG IJ SOLR
40.0000 mg | Freq: Two times a day (BID) | INTRAMUSCULAR | Status: DC
  Administered 2015-08-09 – 2015-08-10 (×2): 40 mg via INTRAVENOUS
  Filled 2015-08-09 (×4): qty 1

## 2015-08-09 MED ORDER — IPRATROPIUM-ALBUTEROL 0.5-2.5 (3) MG/3ML IN SOLN
3.0000 mL | RESPIRATORY_TRACT | Status: DC
  Administered 2015-08-09 – 2015-08-10 (×12): 3 mL via RESPIRATORY_TRACT
  Filled 2015-08-09 (×12): qty 3

## 2015-08-09 MED ORDER — INSULIN ASPART 100 UNIT/ML ~~LOC~~ SOLN
0.0000 [IU] | Freq: Three times a day (TID) | SUBCUTANEOUS | Status: DC
  Administered 2015-08-11 (×2): 2 [IU] via SUBCUTANEOUS
  Administered 2015-08-11: 1 [IU] via SUBCUTANEOUS
  Administered 2015-08-12: 3 [IU] via SUBCUTANEOUS
  Administered 2015-08-12: 2 [IU] via SUBCUTANEOUS

## 2015-08-09 MED ORDER — POLYETHYLENE GLYCOL 3350 17 G PO PACK
17.0000 g | PACK | Freq: Two times a day (BID) | ORAL | Status: DC
  Administered 2015-08-09 – 2015-08-12 (×6): 17 g via ORAL
  Filled 2015-08-09 (×6): qty 1

## 2015-08-09 MED ORDER — FLUTICASONE PROPIONATE 50 MCG/ACT NA SUSP
1.0000 | Freq: Every day | NASAL | Status: DC
  Administered 2015-08-09 – 2015-08-12 (×4): 1 via NASAL
  Filled 2015-08-09: qty 16

## 2015-08-09 NOTE — Progress Notes (Signed)
Inpatient Diabetes Program Recommendations  AACE/ADA: New Consensus Statement on Inpatient Glycemic Control (2015)  Target Ranges:  Prepandial:   less than 140 mg/dL      Peak postprandial:   less than 180 mg/dL (1-2 hours)      Critically ill patients:  140 - 180 mg/dL   Review of Glycemic Control  Diabetes history: No prior hx DM  Inpatient Diabetes Program Recommendations:  Noted CBGs elevated. Please consider A1c to determine prehospital glycemic control. If remains on steroids, please  consider Novolog correction sensitive scale 0-9 units tid and 0-5 hs.  Thank you, Nani Gasser. Jymir Dunaj, RN, MSN, CDE Inpatient Glycemic Control Team Team Pager [phone] (8am-5pm) 08/09/2015 11:16 AM

## 2015-08-09 NOTE — Progress Notes (Signed)
PROGRESS NOTE    Alisha Espinoza  [id number] DOB: 1965-12-03 DOA: 08/08/2015 PCP: Pcp Not In System    Brief Narrative:  Alisha Espinoza is a 49 y.o. female with medical history significant of bronchitis and tobacco abuse; who presents with continued cough and shortness of breath. Symptoms started approximately 5 days ago, after she had been in contacted with one of her nephews who was sick with a cold. She reports shortly thereafter developing a cough and wheezing. Cough was productive with intermittent thick yellowish sputum. Associated symptoms include wheezing, shortness of breath with exertion, rhinorrhea, and left ear discomfort. Denies any fever, chills, nausea, vomiting, abdominal pain diarrhea, headache, or myalgias. Seen in the emergency department 3 days ago, diagnosed at that time with bronchitis and given a Z-Pak, Proventil inhaler, and steroids. The patient picked up the steroids and utilize the inhaler, but did not fill the antibiotics. Reasoning behind not fill the antibiotic prescription is that she is out of work. Despite using the medications that she did receive she still complains of persistent coughing keeping her up at night and feelings that she cannot breathe. Denies trying any other therapies. Patient currently still reports smoking cigarettes although she states she's cut back to only 2 cigarettes a day. She admits it is hard for her to quit and like to have a nicotine patch here in the hospital.   Assessment & Plan:   Principal Problem:   Acute bronchitis Active Problems:   Leukocytosis   Hyperglycemia   Tobacco abuse   1-Acute bronchitis, unknown if underline COPD.  Will need PFT out patient.  Will order IV solumedrol for 24 Hour.  Continue with nebulizer.  Continue with azithromycin.   2-Hyperglycemia; SSI. Will check HbA1c.   3-leukocytosis; suspect related to steroids. On azithro  DVT prophylaxis: lovenox Code Status: full code.  Family Communication:  care discussed with patient  Disposition Plan: home in 24 hours.    Consultants:   none   Procedures:   none  Antimicrobials:   Azithromycin    Subjective: Feeling better, but not breathing at baseline yet.  Feels sinus congestion  Objective: Filed Vitals:   08/09/15 0006 08/09/15 0406 08/09/15 0513 08/09/15 0825  BP:   153/87   Pulse:   91   Temp:   98.2 F (36.8 C)   TempSrc:   Oral   Resp:   19   SpO2: 98% 99% 98% 97%    Intake/Output Summary (Last 24 hours) at 08/09/15 1229 Last data filed at 08/09/15 [id number]  Gross per 24 hour  Intake    240 ml  Output      0 ml  Net    240 ml   There were no vitals filed for this visit.  Examination:  General exam: Appears calm and comfortable  Respiratory system: bilateral wheezing Cardiovascular system: S1 & S2 heard, RRR. No JVD, murmurs, rubs, gallops or clicks. No pedal edema. Gastrointestinal system: Abdomen is nondistended, soft and nontender. No organomegaly or masses felt. Normal bowel sounds heard. Central nervous system: Alert and oriented. No focal neurological deficits. Extremities: Symmetric 5 x 5 power. Skin: No rashes, lesions or ulcers    Data Reviewed: I have personally reviewed following labs and imaging studies  CBC:  Recent Labs Lab 08/06/15 1736 08/08/15 1655 08/09/15 0533  WBC 10.2 15.8* 13.7*  NEUTROABS 5.9  --   --   HGB 13.9 14.2 14.0  HCT 41.8 42.3 41.9  MCV 80.5 79.8 79.5  PLT 228 252  [id number]   Basic Metabolic Panel:  Recent Labs Lab 08/06/15 1655 08/08/15 1655 08/09/15 0533  NA 139 140 140  K 3.6 4.3 4.1  CL 106 107 104  CO2 24 23 23   GLUCOSE 112* 207* 250*  BUN 11 12 13   CREATININE 0.74 0.81 0.77  CALCIUM 9.1 9.5 9.2   GFR: CrCl cannot be calculated (Unknown ideal weight.). Liver Function Tests:  Recent Labs Lab 08/08/15 1655  AST 30  ALT 39  ALKPHOS 85  BILITOT 0.5  PROT 7.5  ALBUMIN 4.2    Recent Labs Lab 08/08/15 1655  LIPASE 20   No results for  input(s): AMMONIA in the last 168 hours. Coagulation Profile: No results for input(s): INR, PROTIME in the last 168 hours. Cardiac Enzymes: No results for input(s): CKTOTAL, CKMB, CKMBINDEX, TROPONINI in the last 168 hours. BNP (last 3 results) No results for input(s): PROBNP in the last 8760 hours. HbA1C: No results for input(s): HGBA1C in the last 72 hours. CBG: No results for input(s): GLUCAP in the last 168 hours. Lipid Profile: No results for input(s): CHOL, HDL, LDLCALC, TRIG, CHOLHDL, LDLDIRECT in the last 72 hours. Thyroid Function Tests: No results for input(s): TSH, T4TOTAL, FREET4, T3FREE, THYROIDAB in the last 72 hours. Anemia Panel: No results for input(s): VITAMINB12, FOLATE, FERRITIN, TIBC, IRON, RETICCTPCT in the last 72 hours. Urine analysis:    Component Value Date/Time   COLORURINE YELLOW 08/08/2015 1825   APPEARANCEUR CLOUDY* 08/08/2015 1825   LABSPEC 1.024 08/08/2015 1825   PHURINE 7.0 08/08/2015 1825   GLUCOSEU 500* 08/08/2015 1825   HGBUR NEGATIVE 08/08/2015 1825   BILIRUBINUR NEGATIVE 08/08/2015 1825   KETONESUR NEGATIVE 08/08/2015 1825   PROTEINUR NEGATIVE 08/08/2015 1825   UROBILINOGEN 0.2 07/27/2012 1213   NITRITE NEGATIVE 08/08/2015 1825   LEUKOCYTESUR NEGATIVE 08/08/2015 1825   Sepsis Labs: No results for input(s): PROCALCITON, LATICACIDVEN in the last 168 hours.  No results found for this or any previous visit (from the past 240 hour(s)).       Radiology Studies: No results found.      Scheduled Meds: . azithromycin  250 mg Oral Daily  . enoxaparin (LOVENOX) injection  40 mg Subcutaneous QHS  . fluticasone  1 spray Each Nare Daily  . guaiFENesin  600 mg Oral BID  . insulin aspart  0-9 Units Subcutaneous TID WC  . ipratropium-albuterol  3 mL Nebulization Q4H  . methylPREDNISolone (SOLU-MEDROL) injection  40 mg Intravenous Q12H  . nicotine  21 mg Transdermal Daily   Continuous Infusions:       Time spent: 35 minutes.      Elmarie Shiley, MD [facility] Pager [phone]  If 7PM-7AM, please contact night-coverage [email or web address] Password [facility] 08/09/2015, 12:29 PM

## 2015-08-09 NOTE — Progress Notes (Signed)
LCSW aware of CSW consult:  Need for PCP and help with medications. Will defer to Case Management for follow up.  Please consult if CSW needs arise.  Lane Hacker, MSW Clinical Social Work: System [employer] [phone]

## 2015-08-10 DIAGNOSIS — J209 Acute bronchitis, unspecified: Secondary | ICD-10-CM | POA: Diagnosis not present

## 2015-08-10 DIAGNOSIS — R739 Hyperglycemia, unspecified: Secondary | ICD-10-CM | POA: Diagnosis not present

## 2015-08-10 LAB — HEMOGLOBIN A1C
Hgb A1c MFr Bld: 6.7 % — ABNORMAL HIGH (ref 4.8–5.6)
Mean Plasma Glucose: 146 mg/dL

## 2015-08-10 LAB — GLUCOSE, CAPILLARY
Glucose-Capillary: 184 mg/dL — ABNORMAL HIGH (ref 65–99)
Glucose-Capillary: 148 mg/dL — ABNORMAL HIGH (ref 65–99)
Glucose-Capillary: 155 mg/dL — ABNORMAL HIGH (ref 65–99)
Glucose-Capillary: 220 mg/dL — ABNORMAL HIGH (ref 65–99)

## 2015-08-10 MED ORDER — METHYLPREDNISOLONE SODIUM SUCC 125 MG IJ SOLR
60.0000 mg | Freq: Four times a day (QID) | INTRAMUSCULAR | Status: DC
  Administered 2015-08-10 – 2015-08-12 (×9): 60 mg via INTRAVENOUS
  Filled 2015-08-10 (×12): qty 0.96

## 2015-08-10 MED ORDER — ALBUTEROL SULFATE (2.5 MG/3ML) 0.083% IN NEBU: 2.5000 mg | INHALATION_SOLUTION | RESPIRATORY_TRACT | Status: DC | PRN

## 2015-08-10 MED ORDER — IPRATROPIUM-ALBUTEROL 0.5-2.5 (3) MG/3ML IN SOLN
3.0000 mL | RESPIRATORY_TRACT | Status: DC
  Administered 2015-08-11 – 2015-08-12 (×7): 3 mL via RESPIRATORY_TRACT
  Filled 2015-08-10 (×7): qty 3

## 2015-08-10 MED ORDER — AMLODIPINE BESYLATE 5 MG PO TABS
5.0000 mg | ORAL_TABLET | Freq: Every day | ORAL | Status: DC
  Administered 2015-08-10 – 2015-08-12 (×3): 5 mg via ORAL
  Filled 2015-08-10 (×3): qty 1

## 2015-08-10 MED ORDER — FLEET ENEMA 7-19 GM/118ML RE ENEM
1.0000 | ENEMA | Freq: Once | RECTAL | Status: AC
  Administered 2015-08-10: 1 via RECTAL
  Filled 2015-08-10: qty 1

## 2015-08-10 NOTE — Progress Notes (Signed)
PROGRESS NOTE    Alisha Espinoza  [id number] DOB: [birth year] DOA: 08/08/2015 PCP: Pcp Not In System    Brief Narrative:  Alisha Espinoza is a 50 y.o. female with medical history significant of bronchitis and tobacco abuse; who presents with continued cough and shortness of breath. Symptoms started approximately 5 days ago, after she had been in contacted with one of her nephews who was sick with a cold. She reports shortly thereafter developing a cough and wheezing. Cough was productive with intermittent thick yellowish sputum. Associated symptoms include wheezing, shortness of breath with exertion, rhinorrhea, and left ear discomfort. Denies any fever, chills, nausea, vomiting, abdominal pain diarrhea, headache, or myalgias. Seen in the emergency department 3 days ago, diagnosed at that time with bronchitis and given a Z-Pak, Proventil inhaler, and steroids. The patient picked up the steroids and utilize the inhaler, but did not fill the antibiotics. Reasoning behind not fill the antibiotic prescription is that she is out of work. Despite using the medications that she did receive she still complains of persistent coughing keeping her up at night and feelings that she cannot breathe. Denies trying any other therapies. Patient currently still reports smoking cigarettes.   Patient admitted with acute bronchitis, suspect COPD exacerbation. She will need PFT outpatient.  Patient still with SOB, wheezing persistent this am. She was complaining of dyspnea this am.   Assessment & Plan:   Principal Problem:   Acute bronchitis Active Problems:   Leukocytosis   Hyperglycemia   Tobacco abuse   1-Acute bronchitis, unknown if underline COPD.  Will need PFT out patient.  Change  IV solumedrol to TID , worsening dyspnea and wheezing.  Continue with nebulizer.  Continue with azithromycin.   2-Hyperglycemia; SSI.  Hb-A1c 6.7, unclear if affected by steroids also. Needs diet education.    3-leukocytosis; suspect related to steroids. On azithro 4-HTN; start norvasc.   DVT prophylaxis: lovenox Code Status: full code.  Family Communication: care discussed with patient  Disposition Plan: home in 24 hours.    Consultants:   none   Procedures:   none  Antimicrobials:   Azithromycin    Subjective: Complaining of dyspnea this am, asking for extra nebulizer treatment.   Objective: Filed Vitals:   08/10/15 0021 08/10/15 0402 08/10/15 0505 08/10/15 0904  BP:   172/94   Pulse:   97   Temp:   98.2 F (36.8 C)   TempSrc:   Oral   Resp:   18   SpO2: 98% 96% 96% 96%    Intake/Output Summary (Last 24 hours) at 08/10/15 1431 Last data filed at 08/10/15 1312  Gross per 24 hour  Intake    720 ml  Output      0 ml  Net    720 ml   There were no vitals filed for this visit.  Examination:  General exam: Appears calm and comfortable  Respiratory system: bilateral wheezing expiratory  Cardiovascular system: S1 & S2 heard, RRR. No JVD, murmurs, rubs, gallops or clicks. No pedal edema. Gastrointestinal system: Abdomen is nondistended, soft and nontender. No organomegaly or masses felt. Normal bowel sounds heard. Central nervous system: Alert and oriented. No focal neurological deficits. Extremities: Symmetric 5 x 5 power. Skin: No rashes, lesions or ulcers    Data Reviewed: I have personally reviewed following labs and imaging studies  CBC:  Recent Labs Lab 08/06/15 1736 08/08/15 1655 08/09/15 0533  WBC 10.2 15.8* 13.7*  NEUTROABS 5.9  --   --   HGB  13.9 14.2 14.0  HCT 41.8 42.3 41.9  MCV 80.5 79.8 79.5  PLT 228 252 [id number]   Basic Metabolic Panel:  Recent Labs Lab 08/06/15 1655 08/08/15 1655 08/09/15 0533  NA 139 140 140  K 3.6 4.3 4.1  CL 106 107 104  CO2 24 23 23   GLUCOSE 112* 207* 250*  BUN 11 12 13   CREATININE 0.74 0.81 0.77  CALCIUM 9.1 9.5 9.2   GFR: CrCl cannot be calculated (Unknown ideal weight.). Liver Function  Tests:  Recent Labs Lab 08/08/15 1655  AST 30  ALT 39  ALKPHOS 85  BILITOT 0.5  PROT 7.5  ALBUMIN 4.2    Recent Labs Lab 08/08/15 1655  LIPASE 20   No results for input(s): AMMONIA in the last 168 hours. Coagulation Profile: No results for input(s): INR, PROTIME in the last 168 hours. Cardiac Enzymes: No results for input(s): CKTOTAL, CKMB, CKMBINDEX, TROPONINI in the last 168 hours. BNP (last 3 results) No results for input(s): PROBNP in the last 8760 hours. HbA1C:  Recent Labs  08/09/15 0532  HGBA1C 6.7*   CBG:  Recent Labs Lab 08/09/15 1637 08/10/15 0752 08/10/15 1228  GLUCAP 157* 184* 148*   Lipid Profile: No results for input(s): CHOL, HDL, LDLCALC, TRIG, CHOLHDL, LDLDIRECT in the last 72 hours. Thyroid Function Tests: No results for input(s): TSH, T4TOTAL, FREET4, T3FREE, THYROIDAB in the last 72 hours. Anemia Panel: No results for input(s): VITAMINB12, FOLATE, FERRITIN, TIBC, IRON, RETICCTPCT in the last 72 hours. Urine analysis:    Component Value Date/Time   COLORURINE YELLOW 08/08/2015 1825   APPEARANCEUR CLOUDY* 08/08/2015 1825   LABSPEC 1.024 08/08/2015 1825   PHURINE 7.0 08/08/2015 1825   GLUCOSEU 500* 08/08/2015 1825   HGBUR NEGATIVE 08/08/2015 1825   BILIRUBINUR NEGATIVE 08/08/2015 1825   KETONESUR NEGATIVE 08/08/2015 1825   PROTEINUR NEGATIVE 08/08/2015 1825   UROBILINOGEN 0.2 07/27/2012 1213   NITRITE NEGATIVE 08/08/2015 1825   LEUKOCYTESUR NEGATIVE 08/08/2015 1825   Sepsis Labs: No results for input(s): PROCALCITON, LATICACIDVEN in the last 168 hours.  No results found for this or any previous visit (from the past 240 hour(s)).       Radiology Studies: No results found.      Scheduled Meds: . amLODipine  5 mg Oral Daily  . azithromycin  250 mg Oral Daily  . enoxaparin (LOVENOX) injection  40 mg Subcutaneous QHS  . fluticasone  1 spray Each Nare Daily  . guaiFENesin  600 mg Oral BID  . insulin aspart  0-9 Units  Subcutaneous TID WC  . ipratropium-albuterol  3 mL Nebulization Q4H  . methylPREDNISolone (SOLU-MEDROL) injection  60 mg Intravenous Q6H  . nicotine  21 mg Transdermal Daily  . polyethylene glycol  17 g Oral BID   Continuous Infusions:    LOS: 1 day    Time spent: 35 minutes.     Elmarie Shiley, MD [facility] Pager [phone]  If 7PM-7AM, please contact night-coverage [email or web address] Password [facility] 08/10/2015, 2:31 PM

## 2015-08-11 DIAGNOSIS — D72829 Elevated white blood cell count, unspecified: Secondary | ICD-10-CM | POA: Diagnosis not present

## 2015-08-11 DIAGNOSIS — J209 Acute bronchitis, unspecified: Secondary | ICD-10-CM | POA: Diagnosis not present

## 2015-08-11 LAB — BASIC METABOLIC PANEL
Anion gap: 9 (ref 5–15)
BUN: 20 mg/dL (ref 6–20)
CO2: 25 mmol/L (ref 22–32)
Calcium: 9.2 mg/dL (ref 8.9–10.3)
Chloride: 106 mmol/L (ref 101–111)
Creatinine, Ser: 0.77 mg/dL (ref 0.44–1.00)
GFR calc Af Amer: >60 mL/min (ref >60)
GFR calc non Af Amer: >60 mL/min (ref >60)
Glucose, Bld: 182 mg/dL — ABNORMAL HIGH (ref 65–99)
Potassium: 4 mmol/L (ref 3.5–5.1)
Sodium: 140 mmol/L (ref 135–145)

## 2015-08-11 LAB — GLUCOSE, CAPILLARY
Glucose-Capillary: 144 mg/dL — ABNORMAL HIGH (ref 65–99)
Glucose-Capillary: 155 mg/dL — ABNORMAL HIGH (ref 65–99)
Glucose-Capillary: 171 mg/dL — ABNORMAL HIGH (ref 65–99)
Glucose-Capillary: 135 mg/dL — ABNORMAL HIGH (ref 65–99)

## 2015-08-11 MED ORDER — LORATADINE 10 MG PO TABS
10.0000 mg | ORAL_TABLET | Freq: Every day | ORAL | Status: DC
  Administered 2015-08-11 – 2015-08-12 (×2): 10 mg via ORAL
  Filled 2015-08-11 (×2): qty 1

## 2015-08-11 MED ORDER — LIVING WELL WITH DIABETES BOOK
Freq: Once | Status: AC
  Administered 2015-08-11: 1
  Filled 2015-08-11: qty 1

## 2015-08-11 NOTE — Progress Notes (Signed)
PROGRESS NOTE    Alisha Espinoza  [id number] DOB: 1965-06-09 DOA: 08/08/2015 PCP: Pcp Not In System    Brief Narrative:  Alisha Espinoza is a 50 y.o. female with medical history significant of bronchitis and tobacco abuse; who presents with continued cough and shortness of breath. Symptoms started approximately 5 days ago, after she had been in contacted with one of her nephews who was sick with a cold. She reports shortly thereafter developing a cough and wheezing. Cough was productive with intermittent thick yellowish sputum. Associated symptoms include wheezing, shortness of breath with exertion, rhinorrhea, and left ear discomfort. Denies any fever, chills, nausea, vomiting, abdominal pain diarrhea, headache, or myalgias. Seen in the emergency department 3 days ago, diagnosed at that time with bronchitis and given a Z-Pak, Proventil inhaler, and steroids. The patient picked up the steroids and utilize the inhaler, but did not fill the antibiotics. Reasoning behind not fill the antibiotic prescription is that she is out of work. Despite using the medications that she did receive she still complains of persistent coughing keeping her up at night and feelings that she cannot breathe. Denies trying any other therapies. Patient currently still reports smoking cigarettes.   Patient admitted with acute bronchitis, suspect COPD exacerbation. She will need PFT outpatient.   Patient is feeling better today, she still has diffuses wheezing. .   Assessment & Plan:   Principal Problem:   Acute bronchitis Active Problems:   Leukocytosis   Hyperglycemia   Tobacco abuse   1-Acute bronchitis, unknown if underline COPD.  Will need PFT out patient.  Continue with   IV solumedrol to TID  Continue with nebulizer.  Continue with azithromycin.  nopt ready for discharge, still with diffuse wheezing and SOB.   2-DM; Hyperglycemia; SSI.  Hb-A1c 6.7, unclear if affected by steroids also. Needs diet  education.  Will be discharge on metformin low dose.   3-leukocytosis; suspect related to steroids. On azithro 4-HTN; started norvasc 5-11.   DVT prophylaxis: lovenox Code Status: full code.  Family Communication: care discussed with patient  Disposition Plan: home in 24 hours. Home hopefully 5-13 if Respiratory status improved.    Consultants:   none   Procedures:   none  Antimicrobials:   Azithromycin    Subjective: Still with SOB , breathing better than yesterday, feels congested.    Objective: Filed Vitals:   08/10/15 2200 08/10/15 2327 08/11/15 0600 08/11/15 0855  BP: 149/90  152/89   Pulse: 101  100   Temp: 98.2 F (36.8 C)  98.3 F (36.8 C)   TempSrc: Oral  Oral   Resp: 20  18   SpO2: 99% 100% 100% 98%    Intake/Output Summary (Last 24 hours) at 08/11/15 1340 Last data filed at 08/11/15 0940  Gross per 24 hour  Intake    360 ml  Output      0 ml  Net    360 ml   There were no vitals filed for this visit.  Examination:  General exam: Appears calm and comfortable  Respiratory system: bilateral wheezing expiratory  Cardiovascular system: S1 & S2 heard, RRR. No JVD, murmurs, rubs, gallops or clicks. No pedal edema. Gastrointestinal system: Abdomen is nondistended, soft and nontender. No organomegaly or masses felt. Normal bowel sounds heard. Central nervous system: Alert and oriented. No focal neurological deficits. Extremities: Symmetric 5 x 5 power. Skin: No rashes, lesions or ulcers    Data Reviewed: I have personally reviewed following labs and imaging studies  CBC:  Recent Labs Lab 08/06/15 1736 08/08/15 1655 08/09/15 0533  WBC 10.2 15.8* 13.7*  NEUTROABS 5.9  --   --   HGB 13.9 14.2 14.0  HCT 41.8 42.3 41.9  MCV 80.5 79.8 79.5  PLT 228 252 [id number]   Basic Metabolic Panel:  Recent Labs Lab 08/06/15 1655 08/08/15 1655 08/09/15 0533 08/11/15 0524  NA 139 140 140 140  K 3.6 4.3 4.1 4.0  CL 106 107 104 106  CO2 24 23 23 25     GLUCOSE 112* 207* 250* 182*  BUN 11 12 13 20   CREATININE 0.74 0.81 0.77 0.77  CALCIUM 9.1 9.5 9.2 9.2   GFR: CrCl cannot be calculated (Unknown ideal weight.). Liver Function Tests:  Recent Labs Lab 08/08/15 1655  AST 30  ALT 39  ALKPHOS 85  BILITOT 0.5  PROT 7.5  ALBUMIN 4.2    Recent Labs Lab 08/08/15 1655  LIPASE 20   No results for input(s): AMMONIA in the last 168 hours. Coagulation Profile: No results for input(s): INR, PROTIME in the last 168 hours. Cardiac Enzymes: No results for input(s): CKTOTAL, CKMB, CKMBINDEX, TROPONINI in the last 168 hours. BNP (last 3 results) No results for input(s): PROBNP in the last 8760 hours. HbA1C:  Recent Labs  08/09/15 0532  HGBA1C 6.7*   CBG:  Recent Labs Lab 08/10/15 1228 08/10/15 1703 08/10/15 2213 08/11/15 0731 08/11/15 1153  GLUCAP 148* 155* 220* 144* 155*   Lipid Profile: No results for input(s): CHOL, HDL, LDLCALC, TRIG, CHOLHDL, LDLDIRECT in the last 72 hours. Thyroid Function Tests: No results for input(s): TSH, T4TOTAL, FREET4, T3FREE, THYROIDAB in the last 72 hours. Anemia Panel: No results for input(s): VITAMINB12, FOLATE, FERRITIN, TIBC, IRON, RETICCTPCT in the last 72 hours. Urine analysis:    Component Value Date/Time   COLORURINE YELLOW 08/08/2015 1825   APPEARANCEUR CLOUDY* 08/08/2015 1825   LABSPEC 1.024 08/08/2015 1825   PHURINE 7.0 08/08/2015 1825   GLUCOSEU 500* 08/08/2015 1825   HGBUR NEGATIVE 08/08/2015 1825   BILIRUBINUR NEGATIVE 08/08/2015 1825   KETONESUR NEGATIVE 08/08/2015 1825   PROTEINUR NEGATIVE 08/08/2015 1825   UROBILINOGEN 0.2 07/27/2012 1213   NITRITE NEGATIVE 08/08/2015 1825   LEUKOCYTESUR NEGATIVE 08/08/2015 1825   Sepsis Labs: No results for input(s): PROCALCITON, LATICACIDVEN in the last 168 hours.  No results found for this or any previous visit (from the past 240 hour(s)).       Radiology Studies: No results found.      Scheduled Meds: .  amLODipine  5 mg Oral Daily  . azithromycin  250 mg Oral Daily  . enoxaparin (LOVENOX) injection  40 mg Subcutaneous QHS  . fluticasone  1 spray Each Nare Daily  . guaiFENesin  600 mg Oral BID  . insulin aspart  0-9 Units Subcutaneous TID WC  . ipratropium-albuterol  3 mL Nebulization Q4H WA  . methylPREDNISolone (SOLU-MEDROL) injection  60 mg Intravenous Q6H  . nicotine  21 mg Transdermal Daily  . polyethylene glycol  17 g Oral BID   Continuous Infusions:    LOS: 2 days    Time spent: 35 minutes.     Elmarie Shiley, MD [facility] Pager [phone]  If 7PM-7AM, please contact night-coverage [email or web address] Password [email or web address] 08/11/2015, 1:40 PM

## 2015-08-11 NOTE — Care Management Note (Signed)
Case Management Note  Patient Details  Name: Alisha Espinoza MRN: [id number] Date of Birth: 1965-10-06  Subjective/Objective:   Admitted with Acute bronchitis, failed OP treatment                 Action/Plan: Discharge planning, spoke with patient at bedside. Patient states she does not have PCP, recently had employer insurance but no longer has that job so lost her insurance. She is planning to move to [facility] for employment, she will leave on Monday. Spoke with her about the need to establish with PCP in [facility] to address A1C elevation and chronic respiratory issues. She plans to find someone there to see. States she usually does not take any medications but is able to afford meds if from the $4 list of [facility]. Will continue to follow for d/c needs.   Expected Discharge Date:                  Expected Discharge Plan:  Home/Self Care  In-House Referral:  PCP / [facility]  Discharge planning Services  CM Consult  Post Acute Care Choice:  NA Choice offered to:  NA  DME Arranged:  N/A DME Agency:  NA  HH Arranged:  NA HH Agency:  NA  Status of Service:  Completed, signed off  Medicare Important Message Given:    Date Medicare IM Given:    Medicare IM give by:    Date Additional Medicare IM Given:    Additional Medicare Important Message give by:     If discussed at [place] of Stay Meetings, dates discussed:    Additional Comments:  Guadalupe Maple, RN 08/11/2015, 10:51 AM

## 2015-08-11 NOTE — Progress Notes (Signed)
Inpatient Diabetes Program Recommendations  AACE/ADA: New Consensus Statement on Inpatient Glycemic Control (2015)  Target Ranges:  Prepandial:   less than 140 mg/dL      Peak postprandial:   less than 180 mg/dL (1-2 hours)      Critically ill patients:  140 - 180 mg/dL   Review of Glycemic Control  Diabetes history: "Borderline DM" Outpatient Diabetes medications: None Current orders for Inpatient glycemic control: Novolog sensitive tidwc  Results for SUHANA, CRETE (MRN [id number]) as of 08/11/2015 16:04  Ref. Range 08/09/2015 05:32  Hemoglobin A1C Latest Ref Range: 4.8-5.6 % 6.7 (H)  Results for SHERRICA, HOERIG (MRN [id number]) as of 08/11/2015 16:04  Ref. Range 08/10/2015 12:28 08/10/2015 17:03 08/10/2015 22:13 08/11/2015 07:31 08/11/2015 11:53  Glucose-Capillary Latest Ref Range: 65-99 mg/dL 148 (H) 155 (H) 220 (H) 144 (H) 155 (H)   New diagnosis of DM.   Inpatient Diabetes Program Recommendations:    Agree with orders. Metformin 500 mg bid at discharge.  Spoke with pt at length regarding diagnosis of DM. Discussed HgbA1C results and goal of < 7%. Recommended purchasing meter and supplies to check blood sugars at home. Needs to obtain PCP. Moving to [facility] on Monday. 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 "I know what to do because I've done it before." Discussed diet, exercise, and importance of controlling blood sugars to prevent long-term complications.  Will follow while inpatient. Thank you. Lorenda Peck, RD, LDN, CDE Inpatient Diabetes Coordinator [phone]

## 2015-08-11 NOTE — Progress Notes (Signed)
PT demonstrated verbal and hands on understanding of Flutter device. 

## 2015-08-12 DIAGNOSIS — R739 Hyperglycemia, unspecified: Secondary | ICD-10-CM | POA: Diagnosis not present

## 2015-08-12 DIAGNOSIS — J209 Acute bronchitis, unspecified: Secondary | ICD-10-CM | POA: Diagnosis not present

## 2015-08-12 LAB — GLUCOSE, CAPILLARY
Glucose-Capillary: 147 mg/dL — ABNORMAL HIGH (ref 65–99)
Glucose-Capillary: 170 mg/dL — ABNORMAL HIGH (ref 65–99)
Glucose-Capillary: 212 mg/dL — ABNORMAL HIGH (ref 65–99)

## 2015-08-12 MED ORDER — FLUTICASONE PROPIONATE 50 MCG/ACT NA SUSP: 1.0000 | Freq: Every day | NASAL | Status: DC

## 2015-08-12 MED ORDER — METFORMIN HCL 500 MG PO TABS: 500.0000 mg | ORAL_TABLET | Freq: Two times a day (BID) | ORAL | Status: DC

## 2015-08-12 MED ORDER — BENZONATATE 200 MG PO CAPS: 200.0000 mg | ORAL_CAPSULE | Freq: Three times a day (TID) | ORAL | Status: DC | PRN

## 2015-08-12 MED ORDER — GUAIFENESIN ER 600 MG PO TB12: 600.0000 mg | ORAL_TABLET | Freq: Two times a day (BID) | ORAL | Status: DC

## 2015-08-12 MED ORDER — NICOTINE 21 MG/24HR TD PT24: 21.0000 mg | MEDICATED_PATCH | Freq: Every day | TRANSDERMAL | Status: DC

## 2015-08-12 MED ORDER — TIOTROPIUM BROMIDE MONOHYDRATE 18 MCG IN CAPS: 18.0000 ug | ORAL_CAPSULE | Freq: Every morning | RESPIRATORY_TRACT | Status: DC

## 2015-08-12 MED ORDER — LORATADINE 10 MG PO TABS: 10.0000 mg | ORAL_TABLET | Freq: Every day | ORAL | Status: DC

## 2015-08-12 MED ORDER — AMLODIPINE BESYLATE 5 MG PO TABS: 5.0000 mg | ORAL_TABLET | Freq: Every day | ORAL | Status: DC

## 2015-08-12 MED ORDER — FLEET ENEMA 7-19 GM/118ML RE ENEM
1.0000 | ENEMA | Freq: Once | RECTAL | Status: AC
  Administered 2015-08-12: 1 via RECTAL
  Filled 2015-08-12: qty 1

## 2015-08-12 MED ORDER — ALBUTEROL SULFATE HFA 108 (90 BASE) MCG/ACT IN AERS: 1.0000 | INHALATION_SPRAY | Freq: Four times a day (QID) | RESPIRATORY_TRACT | Status: DC | PRN

## 2015-08-12 MED ORDER — PREDNISONE 20 MG PO TABS: ORAL_TABLET | ORAL | Status: DC

## 2015-08-12 NOTE — Discharge Summary (Signed)
Physician Discharge Summary  Alisha Espinoza [id number] DOB: Nov 10, 1965 DOA: 08/08/2015  PCP: Pcp Not In System  Admit date: 08/08/2015 Discharge date: 08/12/2015  Time spent: 35 minutes  Recommendations for Outpatient Follow-up:  1. Needs further titration of DM and HTN medications.  2. Counseling regarding smoking cessation.  3. Needs PFT 4.    Discharge Diagnoses:    Acute bronchitis   Leukocytosis   DM   HTN   Hyperglycemia   Tobacco abuse   Discharge Condition: stable.   Diet recommendation: carb modified diet   There were no vitals filed for this visit.  History of present illness:  Alisha Espinoza is a 50 y.o. female with medical history significant of bronchitis and tobacco abuse; who presents with continued cough and shortness of breath. Symptoms started approximately 5 days ago, after she had been in contacted with one of her nephews who was sick with a cold. She reports shortly thereafter developing a cough and wheezing. Cough was productive with intermittent thick yellowish sputum. Associated symptoms include wheezing, shortness of breath with exertion, rhinorrhea, and left ear discomfort. Denies any fever, chills, nausea, vomiting, abdominal pain diarrhea, headache, or myalgias. Seen in the emergency department 3 days ago, diagnosed at that time with bronchitis and given a Z-Pak, Proventil inhaler, and steroids. The patient picked up the steroids and utilize the inhaler, but did not fill the antibiotics. Reasoning behind not fill the antibiotic prescription is that she is out of work. Despite using the medications that she did receive she still complains of persistent coughing keeping her up at night and feelings that she cannot breathe. Denies trying any other therapies. Patient currently still reports smoking cigarettes.   Patient admitted with acute bronchitis, suspect COPD exacerbation. She will need PFT outpatient.    Hospital Course:   1-Acute bronchitis, unknown  if underline COPD.  Will need PFT out patient.  Change IV steroids to prednisone.  Continue with nebulizer.  Finished azithromycin.  She will be discharge on albuterol, prednisone for 5 days, ipratropium.    2-DM; Hyperglycemia; SSI. Hb-A1c 6.7, Discharge on metformin low dose. Needs further titration   3-leukocytosis; suspect related to steroids. Received 4 days azithromycin   4-HTN; started norvasc 5-11. Needs further titration as outpatient.    Procedures:  none  Consultations:  none  Discharge Exam: Filed Vitals:   08/11/15 2104 08/12/15 0635  BP: 156/98 159/92  Pulse: 87 73  Temp: 98.5 F (36.9 C) 97.8 F (36.6 C)  Resp: 17 17    General: NAD Cardiovascular: S 1, S 2 RRR Respiratory: CTA  Discharge Instructions    Discharge Medication List as of 08/12/2015 11:57 AM    START taking these medications   Details  amLODipine (NORVASC) 5 MG tablet Take 1 tablet (5 mg total) by mouth daily., Starting 08/12/2015, Until Discontinued, Print    benzonatate (TESSALON) 200 MG capsule Take 1 capsule (200 mg total) by mouth 3 (three) times daily as needed for cough., Starting 08/12/2015, Until Discontinued, Print    fluticasone (FLONASE) 50 MCG/ACT nasal spray Place 1 spray into both nostrils daily., Starting 08/12/2015, Until Discontinued, Print    guaiFENesin (MUCINEX) 600 MG 12 hr tablet Take 1 tablet (600 mg total) by mouth 2 (two) times daily., Starting 08/12/2015, Until Discontinued, Print    loratadine (CLARITIN) 10 MG tablet Take 1 tablet (10 mg total) by mouth daily., Starting 08/12/2015, Until Discontinued, Print    metFORMIN (GLUCOPHAGE) 500 MG tablet Take 1 tablet (500 mg total) by  mouth 2 (two) times daily with a meal., Starting 08/12/2015, Until Discontinued, Print    nicotine (NICODERM CQ - DOSED IN MG/24 HOURS) 21 mg/24hr patch Place 1 patch (21 mg total) onto the skin daily., Starting 08/12/2015, Until Discontinued, Print    tiotropium (SPIRIVA  HANDIHALER) 18 MCG inhalation capsule Place 1 capsule (18 mcg total) into inhaler and inhale every morning., Starting 08/12/2015, Until Discontinued, Print      CONTINUE these medications which have CHANGED   Details  albuterol (PROVENTIL HFA;VENTOLIN HFA) 108 (90 Base) MCG/ACT inhaler Inhale 1-2 puffs into the lungs every 6 (six) hours as needed for wheezing or shortness of breath., Starting 08/12/2015, Until Discontinued, Print    predniSONE (DELTASONE) 20 MG tablet Take 2 tablets for 5 days, Print      STOP taking these medications     aspirin 325 MG tablet      azithromycin (ZITHROMAX) 250 MG tablet      azithromycin (ZITHROMAX) 250 MG tablet      brompheniramine-pseudoephedrine-DM 30-2-10 MG/5ML syrup      cephALEXin (KEFLEX) 500 MG capsule      doxycycline (VIBRAMYCIN) 100 MG capsule      ibuprofen (ADVIL,MOTRIN) 600 MG tablet      methocarbamol (ROBAXIN) 500 MG tablet      oxymetazoline (AFRIN NASAL SPRAY) 0.05 % nasal spray      Phenylephrine-DM-GG-APAP 5-10-200-325 MG TABS        Allergies  Allergen Reactions  . Meloxicam Hives   Follow-up Information    Follow up with [facility].   Specialty:  Internal Medicine   Why:  please follow up with clinic to arrange appointment to see Primary Care Physician.   Contact information:   [place] [phone]       The results of significant diagnostics from this hospitalization (including imaging, microbiology, ancillary and laboratory) are listed below for reference.    Significant Diagnostic Studies: Dg Chest 2 View  08/06/2015  CLINICAL DATA:  Cough and wheezing and shortness of breath for 2 days. EXAM: CHEST  2 VIEW COMPARISON:  08/26/2014: FINDINGS: The cardiac silhouette, mediastinal and hilar contours are within normal limits and stable. There are chronic bronchitic changes, likely related to smoking. Could not exclude superimposed bronchitis. Streaky  right basilar atelectasis but no infiltrates, effusions or edema. The bony thorax is intact. IMPRESSION: Chronic bronchitic changes, likely related to smoking. Could not exclude superimposed acute bronchitis. Minimal streaky right basilar atelectasis. No infiltrates or effusions. Electronically Signed   By: Marijo Sanes M.D.   On: 08/06/2015 17:09    Microbiology: No results found for this or any previous visit (from the past 240 hour(s)).   Labs: Basic Metabolic Panel:  Recent Labs Lab 08/06/15 1655 08/08/15 1655 08/09/15 0533 08/11/15 0524  NA 139 140 140 140  K 3.6 4.3 4.1 4.0  CL 106 107 104 106  CO2 24 23 23 25   GLUCOSE 112* 207* 250* 182*  BUN 11 12 13 20   CREATININE 0.74 0.81 0.77 0.77  CALCIUM 9.1 9.5 9.2 9.2   Liver Function Tests:  Recent Labs Lab 08/08/15 1655  AST 30  ALT 39  ALKPHOS 85  BILITOT 0.5  PROT 7.5  ALBUMIN 4.2    Recent Labs Lab 08/08/15 1655  LIPASE 20   No results for input(s): AMMONIA in the last 168 hours. CBC:  Recent Labs Lab 08/06/15 1736 08/08/15 1655 08/09/15 0533  WBC 10.2 15.8*  13.7*  NEUTROABS 5.9  --   --   HGB 13.9 14.2 14.0  HCT 41.8 42.3 41.9  MCV 80.5 79.8 79.5  PLT 228 252 263   Cardiac Enzymes: No results for input(s): CKTOTAL, CKMB, CKMBINDEX, TROPONINI in the last 168 hours. BNP: BNP (last 3 results)  Recent Labs  08/06/15 1655  BNP 44.9    ProBNP (last 3 results) No results for input(s): PROBNP in the last 8760 hours.  CBG:  Recent Labs Lab 08/11/15 1705 08/11/15 2047 08/12/15 0640 08/12/15 0901 08/12/15 1157  GLUCAP 171* 135* 147* 170* 212*       Signed:  Niel Hummer A MD.  [facility] 08/12/2015, 2:29 PM

## 2015-08-12 NOTE — Care Management Note (Addendum)
Case Management Note  Patient Details  Name: Alisha Espinoza MRN: [id number] Date of Birth: [birth year]  Subjective/Objective:      Acute bronchitis              Action/Plan: Discharge Planning: AVS reviewed:  Received call and pt states she cannot afford her medications. Call back to pt and she was going to drop her Rx off at pharmacy. She will call back with the pharmacy. Will fax [facility] letter to pt's pharmacy.    1802 Pt states she just dropped off Rx to [facility] on [address], will fax [facility] letter. Pharmacy closed so she will have to pick up medications tomorrow.    Expected Discharge Date:  08/12/2015              Expected Discharge Plan:  Home/Self Care  In-House Referral:  NA  Discharge planning Services  CM Consult, Medication Assistance, [facility]  Post Acute Care Choice:  NA Choice offered to:  NA  DME Arranged:  N/A DME Agency:  NA  HH Arranged:  NA HH Agency:  NA  Status of Service:  Completed, signed off  Medicare Important Message Given:    Date Medicare IM Given:    Medicare IM give by:    Date Additional Medicare IM Given:    Additional Medicare Important Message give by:     If discussed at [place] of Stay Meetings, dates discussed:    Additional Comments:  Erenest Rasher, RN 08/12/2015, 5:30 PM

## 2016-05-26 IMAGING — CR DG CHEST 2V
2 series · 2 of 2 positions shown · non-contrast
Comparison: 12/15/2013

CLINICAL DATA: Cough and congestion for 2 days.

EXAM:
CHEST  2 VIEW

[w chest pa]
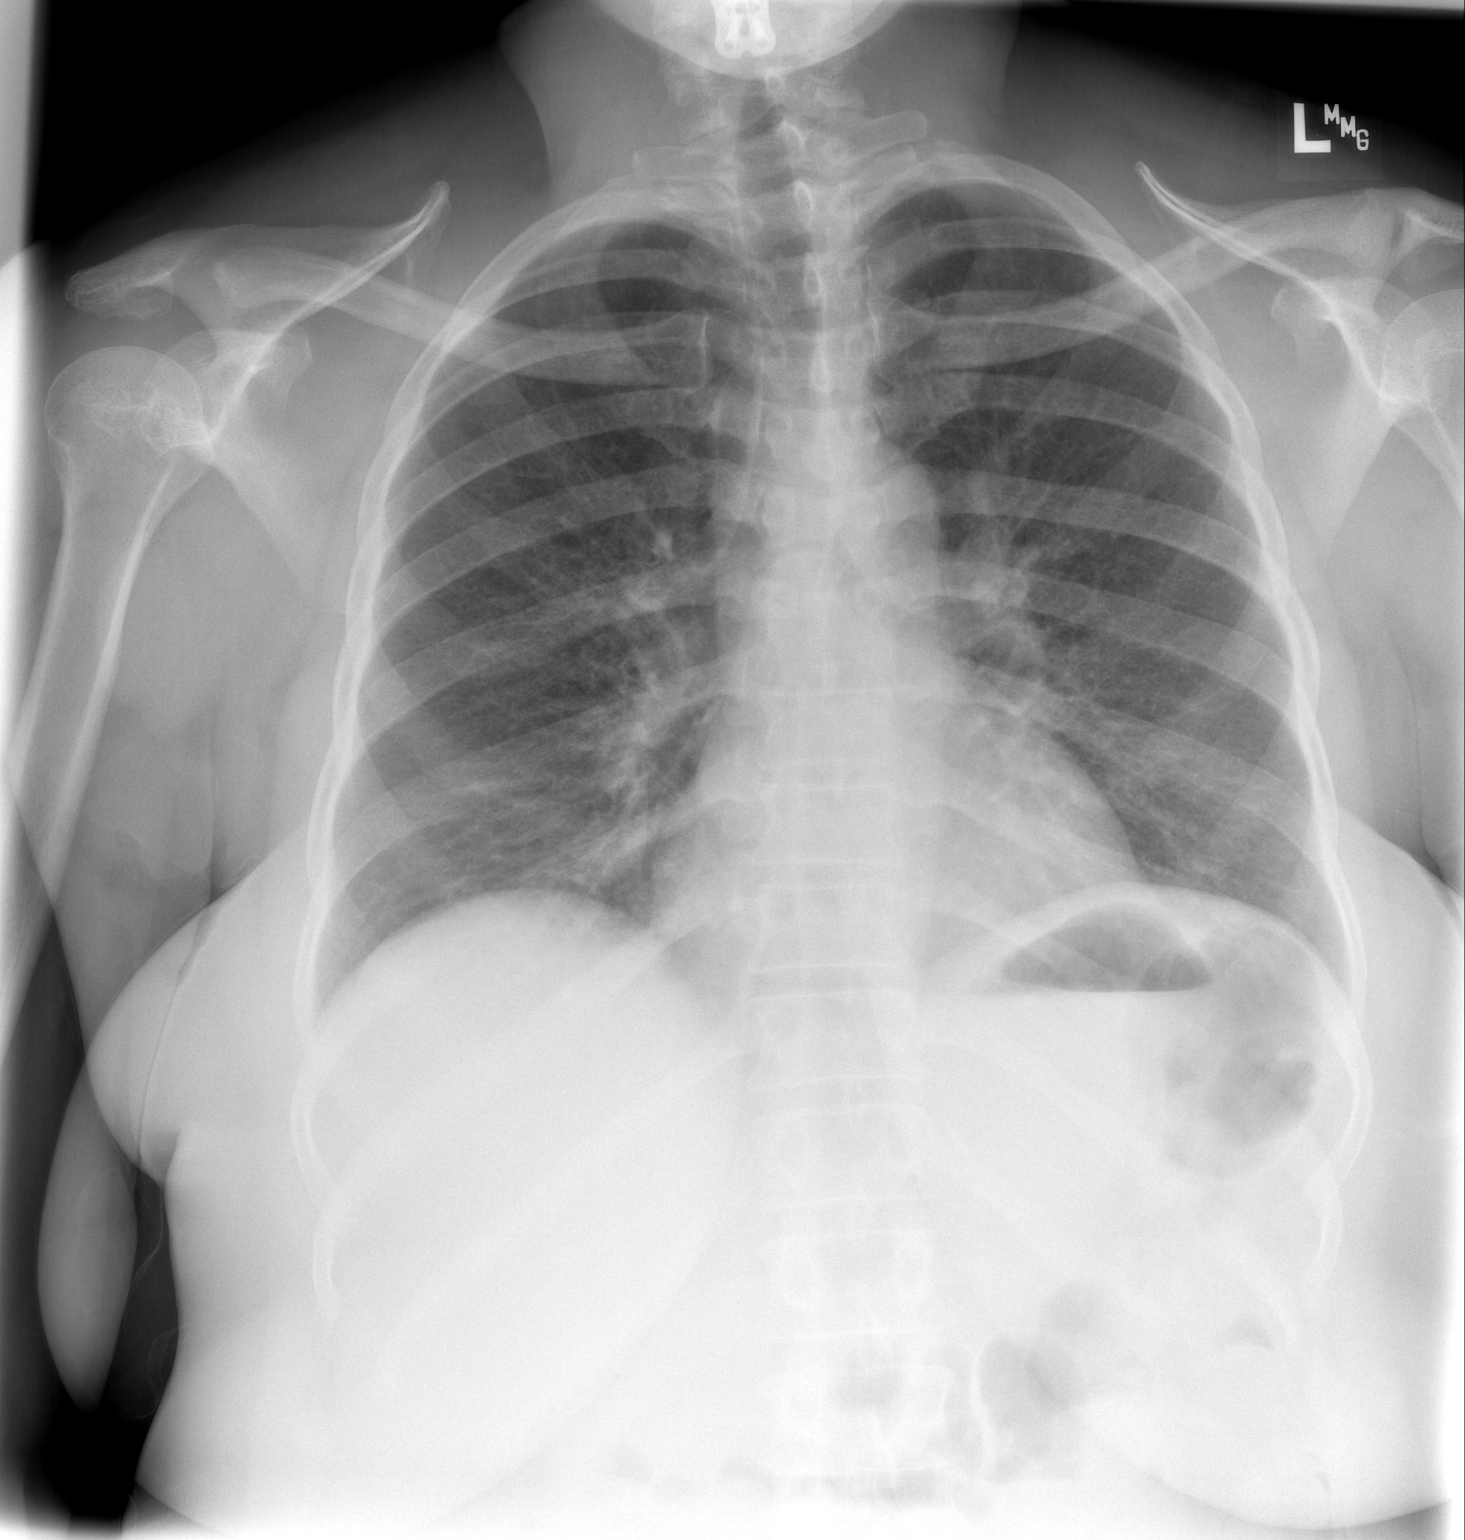

[w chest lat]
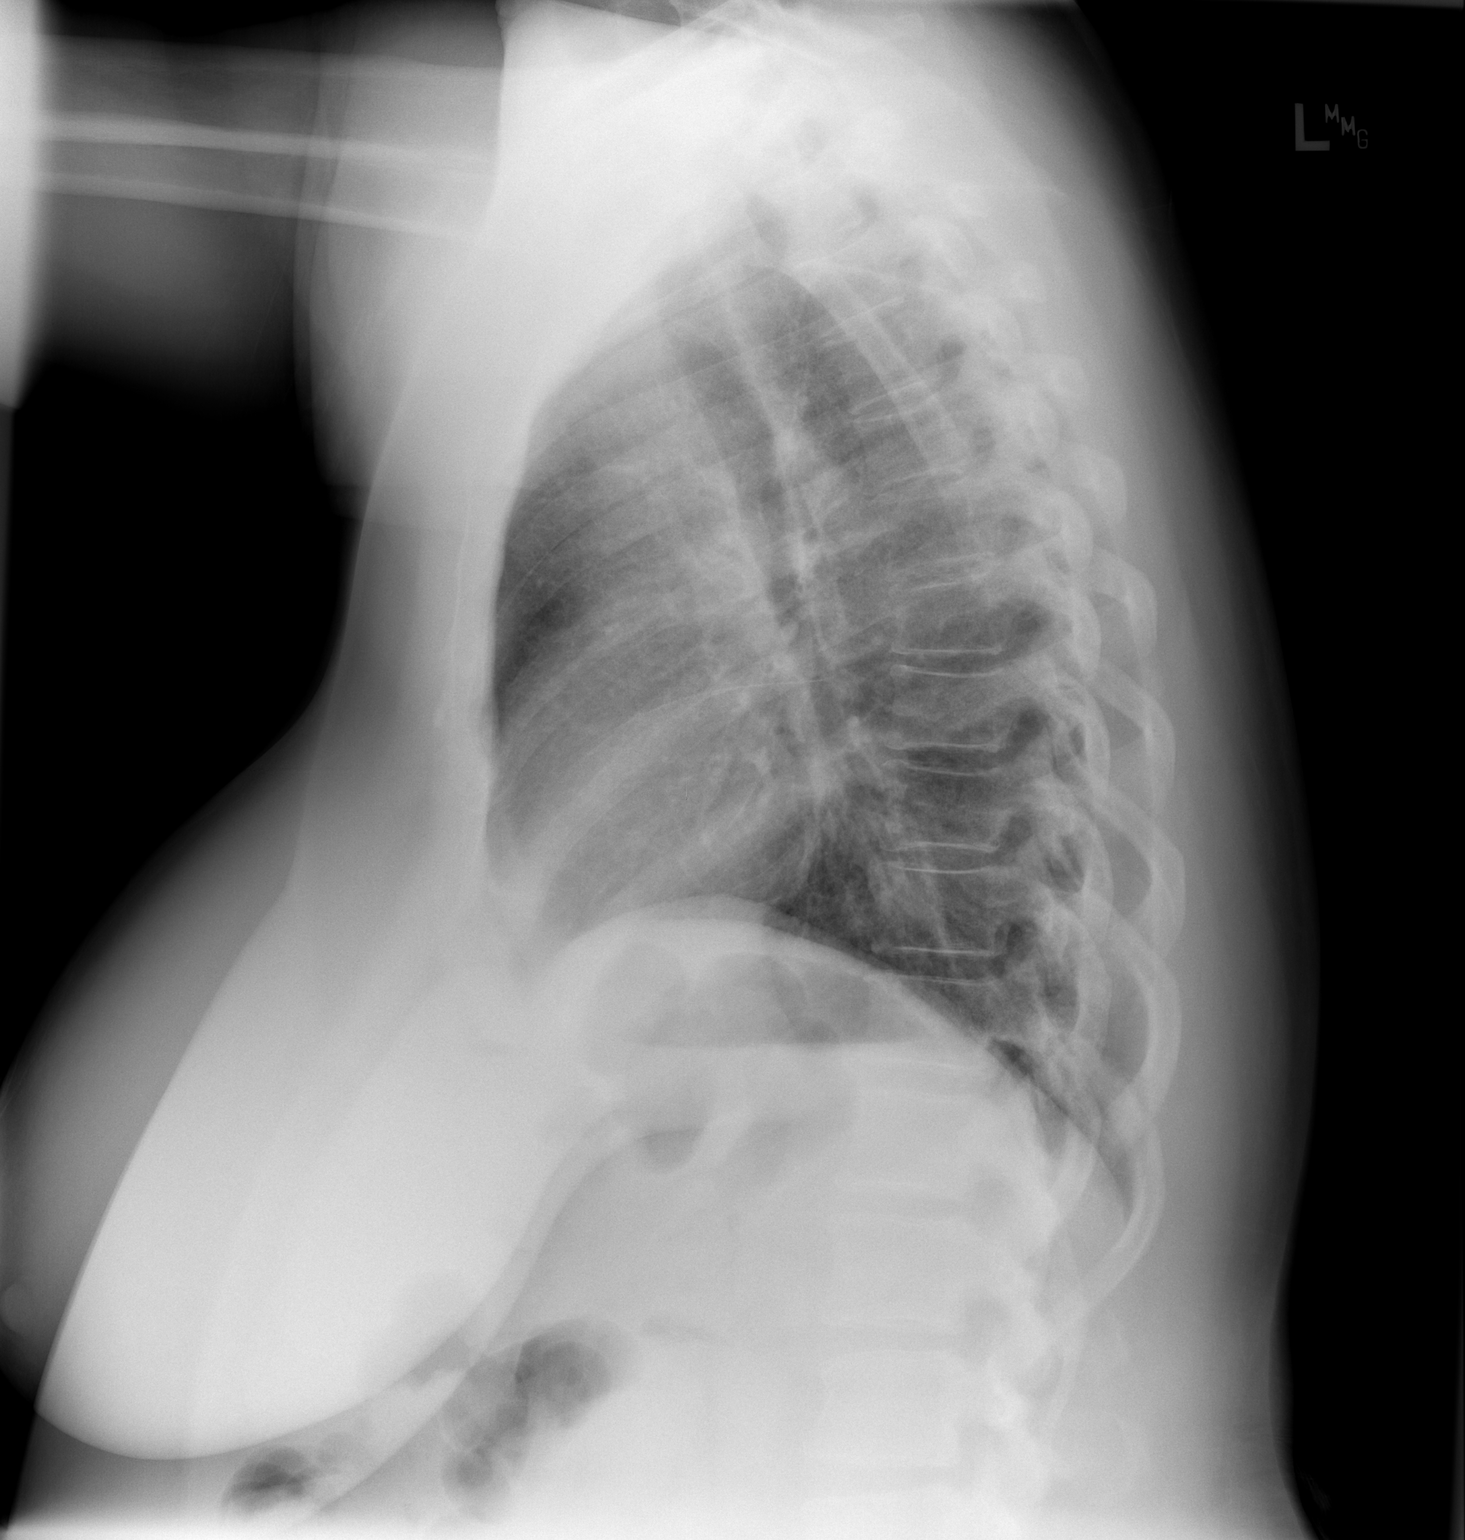

[2 of 2 positions shown; findings below may reference images not displayed]

FINDINGS: Chronic bronchitic markings. No evidence for focal pneumonia. Normal
heart size and mediastinal contours. No effusion or pneumothorax.
IMPRESSION: Bronchitis without focal pneumonia.

## 2016-10-25 ENCOUNTER — Encounter (HOSPITAL_COMMUNITY): Payer: Self-pay

## 2016-10-25 ENCOUNTER — Emergency Department (HOSPITAL_COMMUNITY)
Admission: EM | Admit: 2016-10-25 | Discharge: 2016-10-25 | Disposition: A | Discharge status: Home / Self Care | Payer: Self-pay | Attending: Emergency Medicine | Admitting: Emergency Medicine

## 2016-10-25 ENCOUNTER — Emergency Department (HOSPITAL_COMMUNITY): Payer: Self-pay

## 2016-10-25 DIAGNOSIS — J4541 Moderate persistent asthma with (acute) exacerbation: Secondary | ICD-10-CM | POA: Insufficient documentation

## 2016-10-25 DIAGNOSIS — F17210 Nicotine dependence, cigarettes, uncomplicated: Secondary | ICD-10-CM | POA: Insufficient documentation

## 2016-10-25 DIAGNOSIS — R05 Cough: Secondary | ICD-10-CM | POA: Insufficient documentation

## 2016-10-25 MED ORDER — IPRATROPIUM-ALBUTEROL 0.5-2.5 (3) MG/3ML IN SOLN
3.0000 mL | Freq: Once | RESPIRATORY_TRACT | Status: AC
  Administered 2016-10-25: 3 mL via RESPIRATORY_TRACT

## 2016-10-25 MED ORDER — PREDNISONE 20 MG PO TABS
60.0000 mg | ORAL_TABLET | Freq: Once | ORAL | Status: AC
  Administered 2016-10-25: 60 mg via ORAL
  Filled 2016-10-25: qty 3

## 2016-10-25 MED ORDER — PREDNISONE 50 MG PO TABS: ORAL_TABLET | ORAL | 0 refills | Status: DC

## 2016-10-25 MED ORDER — ALBUTEROL SULFATE HFA 108 (90 BASE) MCG/ACT IN AERS: 1.0000 | INHALATION_SPRAY | RESPIRATORY_TRACT | 1 refills | Status: DC | PRN

## 2016-10-25 MED ORDER — IPRATROPIUM-ALBUTEROL 0.5-2.5 (3) MG/3ML IN SOLN
3.0000 mL | Freq: Once | RESPIRATORY_TRACT | Status: AC
  Administered 2016-10-25: 3 mL via RESPIRATORY_TRACT
  Filled 2016-10-25: qty 3

## 2016-10-25 NOTE — ED Provider Notes (Signed)
[facility] DEPT Provider Note   CSN: [id number] Arrival date & time: 10/25/16  0451     History   Chief Complaint Chief Complaint  Patient presents with  . Cough  . Shortness of Breath    HPI Alisha Espinoza is a 51 y.o. female.  The history is provided by the patient.  She comes in with a four-day history of cough productive of yellow sputum, and dyspnea which is worse with exertion. She denies fever, chills, sweats. Symptoms started to improve, then got worse. She denies any chest pain. She has nausea when she has a coughing paroxysm, but has not vomited. Nothing makes symptoms better, nothing makes them worse. She has tried taking Primatene without relief. She does admit to smoking 1/4 pack cigarettes a day.  Past Medical History:  Diagnosis Date  . Bronchitis   . Heart murmur    "closed by age 58."    Patient Active Problem List   Diagnosis Date Noted  . Acute bronchitis 08/08/2015  . Leukocytosis 08/08/2015  . Hyperglycemia 08/08/2015  . Tobacco abuse 08/08/2015  . 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 BACTERIAL PNEUMONIA, RIGHT LOWER LOBE 04/27/2008  . LUMP OR MASS IN BREAST 04/27/2008  . COUGH 04/27/2008    Past Surgical History:  Procedure Laterality Date  . ABDOMINAL HYSTERECTOMY  2000  . 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 ANTERIOR CERVICAL DECOMP/DISCECTOMY FUSION N/A 05/07/2012   Procedure: ANTERIOR CERVICAL DECOMPRESSION/DISCECTOMY FUSION 1 LEVEL/HARDWARE REMOVAL;  Surgeon: Melina Schools, MD;  Location: [place] [facility];  Service: Orthopedics;  Laterality: N/A;  REMOVAL OF CERVICAL DISC REPLACEMENT AND ACDF C4-5  . CERVICAL FUSION  05/07/2012   Dr Rolena Infante  . CYSTECTOMY     L wrist  . ROTATOR CUFF REPAIR     Right  . TYMPANOSTOMY TUBE  PLACEMENT      OB History    No data available       Home Medications    Prior to Admission medications   Medication Sig Start Date End Date Taking? Authorizing Provider  albuterol (PROVENTIL HFA;VENTOLIN HFA) 108 (90 Base) MCG/ACT inhaler Inhale 1-2 puffs into the lungs every 6 (six) hours as needed for wheezing or shortness of breath. Patient not taking: Reported on 10/25/2016 08/12/15   Regalado, Jerald Kief A, MD  amLODipine (NORVASC) 5 MG tablet Take 1 tablet (5 mg total) by mouth daily. Patient not taking: Reported on 10/25/2016 08/12/15   Regalado, Jerald Kief A, MD  benzonatate (TESSALON) 200 MG capsule Take 1 capsule (200 mg total) by mouth 3 (three) times daily as needed for cough. Patient not taking: Reported on 10/25/2016 08/12/15   Regalado, Jerald Kief A, MD  fluticasone (FLONASE) 50 MCG/ACT nasal spray Place 1 spray into both nostrils daily. Patient not taking: Reported on 10/25/2016 08/12/15   Regalado, Jerald Kief A, MD  guaiFENesin (MUCINEX) 600 MG 12 hr tablet Take 1 tablet (600 mg total) by mouth 2 (two) times daily. Patient not taking: Reported on 10/25/2016 08/12/15   Regalado, Jerald Kief A, MD  loratadine (CLARITIN) 10 MG tablet Take 1 tablet (10 mg total) by mouth daily. Patient not taking: Reported on 10/25/2016 08/12/15   Regalado, Jerald Kief A, MD  metFORMIN (GLUCOPHAGE) 500 MG tablet Take 1 tablet (500 mg total) by mouth 2 (two) times daily with a meal. Patient not taking: Reported on  10/25/2016 08/12/15   Regalado, Belkys A, MD  nicotine (NICODERM CQ - DOSED IN MG/24 HOURS) 21 mg/24hr patch Place 1 patch (21 mg total) onto the skin daily. Patient not taking: Reported on 10/25/2016 08/12/15   Niel Hummer A, MD  predniSONE (DELTASONE) 20 MG tablet Take 2 tablets for 5 days Patient not taking: Reported on 10/25/2016 08/12/15   Regalado, Jerald Kief A, MD  tiotropium (SPIRIVA HANDIHALER) 18 MCG inhalation capsule Place 1 capsule (18 mcg total) into inhaler and inhale every morning. Patient not taking:  Reported on 10/25/2016 08/12/15   Elmarie Shiley, MD    Family History Family History  Problem Relation Age of Onset  . Diabetes Mother   . Hypertension Father     Social History Social History  Substance Use Topics  . Smoking status: Current Every Day Smoker    Packs/day: 0.50    Years: 20.00    Types: Cigarettes  . Smokeless tobacco: Never Used  . Alcohol use Yes     Comment: occasional     Allergies   Meloxicam   Review of Systems Review of Systems  All other systems reviewed and are negative.    Physical Exam Updated Vital Signs BP (!) 132/92   Pulse (!) 102   Temp 98 F (36.7 C)   Resp (!) 21   Ht 5\' 6"  (1.676 m)   Wt 88.5 kg (195 lb)   SpO2 97%   BMI 31.47 kg/m   Physical Exam  Nursing note and vitals reviewed.  51 year old female, resting comfortably and in no acute distress. Vital signs are significant for borderline hypertension, borderline tachycardia, borderline tachypnea. Oxygen saturation is 97%, which is normal. Head is normocephalic and atraumatic. PERRLA, EOMI. Oropharynx is clear. Neck is nontender and supple without adenopathy or JVD. Back is nontender and there is no CVA tenderness. Lungs have moderate expiratory wheezes without rales or rhonchi. Chest is nontender. Heart has regular rate and rhythm without murmur. Abdomen is soft, flat, nontender without masses or hepatosplenomegaly and peristalsis is normoactive. Extremities have no cyanosis or edema, full range of motion is present. Skin is warm and dry without rash. Neurologic: Mental status is normal, cranial nerves are intact, there are no motor or sensory deficits.  ED Treatments / Results   EKG  EKG Interpretation  Date/Time:  Friday October 25 2016 04:56:48 EDT Ventricular Rate:  100 PR Interval:    QRS Duration: 81 QT Interval:  338 QTC Calculation: 436 R Axis:   86 Text Interpretation:  Sinus tachycardia Anterior infarct, old Borderline T abnormalities, inferior  leads When compared with ECG of 08/06/2015, No significant change was found Confirmed by Delora Fuel ([id number]) on 10/25/2016 5:10:26 AM       Radiology Dg Chest 2 View  Result Date: 10/25/2016 CLINICAL DATA:  Worsening dyspnea over the past 2 days EXAM: CHEST  2 VIEW COMPARISON:  08/06/2015 FINDINGS: The lungs are clear. The pulmonary vasculature is normal. Heart size is normal. Hilar and mediastinal contours are unremarkable. There is no pleural effusion. IMPRESSION: No active cardiopulmonary disease. Electronically Signed   By: Andreas Newport M.D.   On: 10/25/2016 05:52    Procedures Procedures (including critical care time)  Medications Ordered in ED Medications  ipratropium-albuterol (DUONEB) 0.5-2.5 (3) MG/3ML nebulizer solution 3 mL (3 mLs Nebulization Given 10/25/16 0627)  predniSONE (DELTASONE) tablet 60 mg (60 mg Oral Given 10/25/16 0627)  ipratropium-albuterol (DUONEB) 0.5-2.5 (3) MG/3ML nebulizer solution 3 mL (3 mLs Nebulization Given 10/25/16  0730)  ipratropium-albuterol (DUONEB) 0.5-2.5 (3) MG/3ML nebulizer solution 3 mL (3 mLs Nebulization Given 10/25/16 0817)     Initial Impression / Assessment and Plan / ED Course  I have reviewed the triage vital signs and the nursing notes.  Pertinent imaging results that were available during my care of the patient were reviewed by me and considered in my medical decision making (see chart for details).  Cough with bronchospasm. Old records are reviewed, and she has multiple ED visits for bronchitis and community-acquired pneumonia. Chest x-ray shows no evidence of pneumonia. She is given prednisone and nebulizer treatment with albuterol and ipratropium.  She felt somewhat better after initial nebulizer treatment, but still had significant wheezing. She's given a second nebulizer treatment and she feels much better. However, she continues to have slight wheezing. She will be given a third nebulizer treatment and case is signed out to Dr.  Tyrone Nine.  Final Clinical Impressions(s) / ED Diagnoses   Final diagnoses:  Moderate persistent asthma with exacerbation    New Prescriptions Current Discharge Medication List       Delora Fuel, MD 67/12/45 [phone]

## 2016-10-25 NOTE — ED Triage Notes (Signed)
Pt present c/o  Coughing  With some associated SOB x 3 days that exacerbates with exertion.Pt reports productive cough with yellow phlegm. PT is not in any apparent respiratory distress and is able to speak in full sentences.

## 2016-10-25 NOTE — Discharge Instructions (Signed)
Return if breathing is getting worse, or if you start running a high fever.

## 2016-10-25 NOTE — ED Provider Notes (Signed)
Received patient in sign out. Patient briefly with an asthma exacerbation. Awaiting her third duoneb. On reassessment patient feeling much better. No noted wheezes on my exam.  D/c home.     Deno Etienne, DO 10/25/16 1232

## 2016-11-01 ENCOUNTER — Emergency Department (HOSPITAL_COMMUNITY): Payer: Self-pay

## 2016-11-01 ENCOUNTER — Emergency Department (HOSPITAL_COMMUNITY)
Admission: EM | Admit: 2016-11-01 | Discharge: 2016-11-01 | Disposition: A | Discharge status: Home / Self Care | Payer: Self-pay | Attending: Emergency Medicine | Admitting: Emergency Medicine

## 2016-11-01 ENCOUNTER — Encounter (HOSPITAL_COMMUNITY): Payer: Self-pay

## 2016-11-01 DIAGNOSIS — R05 Cough: Secondary | ICD-10-CM | POA: Insufficient documentation

## 2016-11-01 DIAGNOSIS — R059 Cough, unspecified: Secondary | ICD-10-CM

## 2016-11-01 DIAGNOSIS — F17210 Nicotine dependence, cigarettes, uncomplicated: Secondary | ICD-10-CM | POA: Insufficient documentation

## 2016-11-01 MED ORDER — PREDNISONE 20 MG PO TABS
60.0000 mg | ORAL_TABLET | Freq: Once | ORAL | Status: AC
  Administered 2016-11-01: 60 mg via ORAL
  Filled 2016-11-01: qty 3

## 2016-11-01 MED ORDER — IPRATROPIUM-ALBUTEROL 0.5-2.5 (3) MG/3ML IN SOLN
3.0000 mL | Freq: Once | RESPIRATORY_TRACT | Status: AC
  Administered 2016-11-01: 3 mL via RESPIRATORY_TRACT
  Filled 2016-11-01: qty 3

## 2016-11-01 MED ORDER — HYDROCODONE-HOMATROPINE 5-1.5 MG/5ML PO SYRP: 5.0000 mL | ORAL_SOLUTION | Freq: Four times a day (QID) | ORAL | 0 refills | Status: DC | PRN

## 2016-11-01 MED ORDER — TIOTROPIUM BROMIDE MONOHYDRATE 18 MCG IN CAPS: 18.0000 ug | ORAL_CAPSULE | Freq: Every morning | RESPIRATORY_TRACT | 12 refills | Status: DC

## 2016-11-01 MED ORDER — AZITHROMYCIN 250 MG PO TABS: 250.0000 mg | ORAL_TABLET | Freq: Every day | ORAL | 0 refills | Status: AC

## 2016-11-01 MED ORDER — GUAIFENESIN 100 MG/5ML PO SOLN
5.0000 mL | Freq: Once | ORAL | Status: AC
  Administered 2016-11-01: 100 mg via ORAL
  Filled 2016-11-01 (×2): qty 5

## 2016-11-01 MED ORDER — PREDNISONE 10 MG PO TABS: 40.0000 mg | ORAL_TABLET | Freq: Every day | ORAL | 0 refills | Status: AC

## 2016-11-01 NOTE — ED Notes (Signed)
Patient ambulated in hallway with oxygen saturation of 95% room air.

## 2016-11-01 NOTE — ED Provider Notes (Signed)
[facility] Espino[facility] Provider Note   CSN: [id number] Arrival date & time: 11/01/16  1612     History   Chief Complaint Chief Complaint  Patient presents with  . Cough    HPI Alisha Espinoza is a 51 y.o. female with h/o COPD, tobacco abuse presents to ED for evaluation of continued cough, congestion with yellow/white sputum x 1.5 weeks. Was seen in ED 6 days ago for same, was given breathing treatments and discharged with prednisone. Reports h/o shortness of breath with prolonged walking and when laying down, this is chronic and been happening for at least a year, not worse. No fevers, chills, nausea, vomiting, abdominal pain, CP, myalgias, sick contacts.Continues to smoke 4-5 cigarettes daily.   HPI  Past Medical History:  Diagnosis Date  . Bronchitis   . Heart murmur    "closed by age 17."    Patient Active Problem List   Diagnosis Date Noted  . Acute bronchitis 08/08/2015  . Leukocytosis 08/08/2015  . Hyperglycemia 08/08/2015  . Tobacco abuse 08/08/2015  . 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 BACTERIAL PNEUMONIA, RIGHT LOWER LOBE 04/27/2008  . LUMP OR MASS IN BREAST 04/27/2008  . COUGH 04/27/2008    Past Surgical History:  Procedure Laterality Date  . ABDOMINAL HYSTERECTOMY  2000  . 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 ANTERIOR CERVICAL DECOMP/DISCECTOMY FUSION N/A 05/07/2012   Procedure: ANTERIOR CERVICAL DECOMPRESSION/DISCECTOMY FUSION 1 LEVEL/HARDWARE REMOVAL;  Surgeon: Melina Schools, MD;  Location: [facility];  Service: Orthopedics;  Laterality: N/A;  REMOVAL OF CERVICAL DISC REPLACEMENT AND ACDF C4-5  . CERVICAL FUSION  05/07/2012   Dr Rolena Infante  . CYSTECTOMY     L wrist  . ROTATOR CUFF REPAIR     Right  . TYMPANOSTOMY TUBE PLACEMENT      OB  History    No data available       Home Medications    Prior to Admission medications   Medication Sig Start Date End Date Taking? Authorizing Provider  albuterol (PROVENTIL HFA;VENTOLIN HFA) 108 (90 Base) MCG/ACT inhaler Inhale 1-2 puffs into the lungs every 4 (four) hours as needed for wheezing or shortness of breath (or coughing). 5/63/89   Delora Fuel, MD  amLODipine (NORVASC) 5 MG tablet Take 1 tablet (5 mg total) by mouth daily. Patient not taking: Reported on 10/25/2016 08/12/15   Regalado, Jerald Kief A, MD  azithromycin (ZITHROMAX Z-PAK) 250 MG tablet Take 1 tablet (250 mg total) by mouth daily. Take 500 mg on day 1, 250 mg daily on day 2, 3, 4 and 5. 11/01/16 11/07/16  Kinnie Feil, PA-C  HYDROcodone-homatropine [facility]) 5-1.5 MG/5ML syrup Take 5 mLs by mouth every 6 (six) hours as needed for cough. FOR DISRUPTIVE NIGHT TIME COUGH AND BODY ACHES 11/01/16   Kinnie Feil, PA-C  metFORMIN (GLUCOPHAGE) 500 MG tablet Take 1 tablet (500 mg total) by mouth 2 (two) times daily with a meal. Patient not taking: Reported on 10/25/2016 08/12/15   Regalado, Jerald Kief A, MD  predniSONE (DELTASONE) 10 MG tablet Take 4 tablets (40 mg total) by mouth daily. 11/01/16 11/06/16  Kinnie Feil, PA-C  tiotropium (SPIRIVA HANDIHALER) 18 MCG inhalation capsule Place 1 capsule (18 mcg total) into inhaler and inhale every morning. 11/01/16   Kinnie Feil, PA-C    Family History Family  History  Problem Relation Age of Onset  . Diabetes Mother   . Hypertension Father     Social History Social History  Substance Use Topics  . Smoking status: Current Every Day Smoker    Packs/day: 0.50    Years: 20.00    Types: Cigarettes  . Smokeless tobacco: Never Used  . Alcohol use Yes     Comment: occasional     Allergies   Meloxicam   Review of Systems Review of Systems  Constitutional: Negative for chills, diaphoresis and fever.  HENT: Positive for congestion, postnasal drip and rhinorrhea.  Negative for sore throat.   Respiratory: Positive for cough, chest tightness and shortness of breath.   Cardiovascular: Negative for chest pain and palpitations.  Gastrointestinal: Negative for abdominal pain, diarrhea, nausea and vomiting.  Genitourinary: Negative for difficulty urinating and dysuria.  Musculoskeletal: Negative for myalgias.  Skin: Negative for rash.  Neurological: Negative for headaches.     Physical Exam Updated Vital Signs BP (!) 151/101 (BP Location: Right Arm)   Pulse 71   Temp 98.9 F (37.2 C) (Oral)   Resp 18   Ht 5\' 6"  (1.676 m)   Wt 88.5 kg (195 lb)   SpO2 100%   BMI 31.47 kg/m   Physical Exam  Constitutional: She is oriented to person, place, and time. She appears well-developed and well-nourished. No distress.  NAD.  HENT:  Head: Normocephalic and atraumatic.  Right Ear: External ear normal.  Left Ear: External ear normal.  Nasal mucosal edema, clear rhinorrhea Cobblestoning to posterior oropharynx Tonsils normal Moist mucous membranes  Eyes: Conjunctivae and EOM are normal. No scleral icterus.  Neck: Normal range of motion. Neck supple.  No cervical adenopathy  Cardiovascular: Normal rate, regular rhythm and normal heart sounds.   No murmur heard. Pulmonary/Chest: Effort normal. She has wheezes. She has rhonchi.  Diffuse expiratory wheezing and rhonchi, worse on R posterior chest No tachypnea or hypoxia No respiratory distress  Abdominal: Soft. There is no tenderness.  Musculoskeletal: Normal range of motion. She exhibits no deformity.  Neurological: She is alert and oriented to person, place, and time.  Skin: Skin is warm and dry. Capillary refill takes less than 2 seconds.  Psychiatric: She has a normal mood and affect. Her behavior is normal. Judgment and thought content normal.  Nursing note and vitals reviewed.    ED Treatments / Results  Labs (all labs ordered are listed, but only abnormal results are displayed) Labs Reviewed  - No data to display  EKG  EKG Interpretation None       Radiology Dg Chest 2 View  Result Date: 11/01/2016 CLINICAL DATA:  Cough for the past week. Increasing weakness. Smoker. COPD. EXAM: CHEST  2 VIEW COMPARISON:  10/25/2016. FINDINGS: Normal sized heart. Clear lungs. Diffuse peribronchial thickening without significant change. Cervical spine fixation hardware. IMPRESSION: Stable chronic bronchitic changes.  No acute abnormality. Electronically Signed   By: Claudie Revering M.D.   On: 11/01/2016 17:59    Procedures Procedures (including critical care time)  Medications Ordered in ED Medications  ipratropium-albuterol (DUONEB) 0.5-2.5 (3) MG/3ML nebulizer solution 3 mL (3 mLs Nebulization Given 11/01/16 1804)  predniSONE (DELTASONE) tablet 60 mg (60 mg Oral Given 11/01/16 1804)  ipratropium-albuterol (DUONEB) 0.5-2.5 (3) MG/3ML nebulizer solution 3 mL (3 mLs Nebulization Given 11/01/16 2020)  guaiFENesin (ROBITUSSIN) 100 MG/5ML solution 100 mg (100 mg Oral Given 11/01/16 2042)     Initial Impression / Assessment and Plan / ED Course  I have  reviewed the triage vital signs and the nursing notes.  Pertinent labs & imaging results that were available during my care of the patient were reviewed by me and considered in my medical decision making (see chart for details).  Clinical Course as of Nov 01 2156  Fri Nov 01, 2016  8887 Repeat evaluation; no clinical significant change in breath sounds after duoneb x 1. Will repeat, reassess and ambulate with pulse ox  [CG]  2125 Repeat evaluation, significant improvement in lung sounds after duoneb x2. She ambulated with normal SpO2.   [CG]    Clinical Course User Index [CG] Kinnie Feil, PA-C   51 year old female with history of bronchitis and tobacco abuse, frequent ED visits for cough, shortness of breath and sputum presents to the ED for persistent cough 1.5 weeks. Initial exam reveals diffuse expiratory wheezing and rhonchi. Vital signs  within normal limits, afebrile. Chest x-ray today without infiltrate or fluid. Suspect COPD exacerbation versus CAP versus viral URI. We'll plan on breathing treatments, prednisone,Robitussin and reassess. We'll check oxygen sats on ambulation.  10PM: patient's lungs sounds improved after DuoNeb 2. Normal oxygen saturations on ambulation. Given improvement, afebrile, negative chest x-ray I don't think further emergent lab work or imaging is indicated. No indication for admission at this time. Given persistent cough, tobacco abuse, possible undiagnosed COPD we'll treat with Z-Pak. Encouraged smoking cessation. Will treat symptoms with Robitussin and tiotropium, she has albuterol at home. Advised f/u with PCP and pulmonology, she will benefit from PFTs and proper eval for COPD. Patient verbalized understanding and is agreeable with plan.  Final Clinical Impressions(s) / ED Diagnoses   Final diagnoses:  Cough    New Prescriptions New Prescriptions   AZITHROMYCIN (ZITHROMAX Z-PAK) 250 MG TABLET    Take 1 tablet (250 mg total) by mouth daily. Take 500 mg on day 1, 250 mg daily on day 2, 3, 4 and 5.   HYDROCODONE-HOMATROPINE (HYCODAN) 5-1.5 MG/5ML SYRUP    Take 5 mLs by mouth every 6 (six) hours as needed for cough. FOR DISRUPTIVE NIGHT TIME COUGH AND BODY ACHES   PREDNISONE (DELTASONE) 10 MG TABLET    Take 4 tablets (40 mg total) by mouth daily.     Kinnie Feil, PA-C 11/01/16 2158    Fatima Blank, MD 11/02/16 [phone]

## 2016-11-01 NOTE — Discharge Instructions (Signed)
You presented to the ED for evaluation of persistent cough. Your lung sounds improved after 2 rounds of breathing treatments and cough medication. Your oxygen levels were normal when you are walking. Your chest x-ray did not show fluid or pneumonia. Based on your symptoms and exam however we will treat with antibiotics for possible bacterial infection. Return to the emergency department for worsening symptoms, fever, difficulty breathing, chest pain  Please take antibiotic as prescribed.  Use tiotropium in your inhaler every morning.  Take mucinex during the day for mucus buildup.  Take Hycodan syrup at night time only for cough suppression.

## 2016-11-01 NOTE — ED Triage Notes (Signed)
Patient states she was seen in the ED and was diagnosed with bronchitis last week. Patient states she has taken all antibiotics prescribed, but does not feel any better. Patient reports a productive cough with yellow sputum. Patient denies any fever  Patient reports SOB when she walks for a while.

## 2017-03-06 ENCOUNTER — Ambulatory Visit: Payer: Self-pay | Admitting: Family Medicine

## 2017-03-30 ENCOUNTER — Encounter (HOSPITAL_COMMUNITY): Payer: Self-pay | Admitting: *Deleted

## 2017-03-30 ENCOUNTER — Emergency Department (HOSPITAL_COMMUNITY)
Admission: EM | Admit: 2017-03-30 | Discharge: 2017-03-30 | Disposition: A | Discharge status: Home / Self Care | Payer: BLUE CROSS/BLUE SHIELD | Attending: Emergency Medicine | Admitting: Emergency Medicine

## 2017-03-30 ENCOUNTER — Other Ambulatory Visit: Payer: Self-pay

## 2017-03-30 DIAGNOSIS — N898 Other specified noninflammatory disorders of vagina: Secondary | ICD-10-CM | POA: Diagnosis present

## 2017-03-30 DIAGNOSIS — F17210 Nicotine dependence, cigarettes, uncomplicated: Secondary | ICD-10-CM | POA: Diagnosis not present

## 2017-03-30 DIAGNOSIS — B9689 Other specified bacterial agents as the cause of diseases classified elsewhere: Secondary | ICD-10-CM | POA: Insufficient documentation

## 2017-03-30 DIAGNOSIS — Z7984 Long term (current) use of oral hypoglycemic drugs: Secondary | ICD-10-CM | POA: Insufficient documentation

## 2017-03-30 DIAGNOSIS — N760 Acute vaginitis: Secondary | ICD-10-CM | POA: Diagnosis not present

## 2017-03-30 LAB — WET PREP, GENITAL
Sperm: NONE SEEN
Trich, Wet Prep: NONE SEEN
Yeast Wet Prep HPF POC: NONE SEEN

## 2017-03-30 MED ORDER — STERILE WATER FOR INJECTION IJ SOLN
INTRAMUSCULAR | Status: AC
  Administered 2017-03-30: 10 mL
  Filled 2017-03-30: qty 10

## 2017-03-30 MED ORDER — CEFTRIAXONE SODIUM 250 MG IJ SOLR
250.0000 mg | Freq: Once | INTRAMUSCULAR | Status: AC
  Administered 2017-03-30: 250 mg via INTRAMUSCULAR
  Filled 2017-03-30: qty 250

## 2017-03-30 MED ORDER — AZITHROMYCIN 250 MG PO TABS
1000.0000 mg | ORAL_TABLET | Freq: Once | ORAL | Status: AC
  Administered 2017-03-30: 1000 mg via ORAL
  Filled 2017-03-30: qty 4

## 2017-03-30 MED ORDER — METRONIDAZOLE 500 MG PO TABS: 500.0000 mg | ORAL_TABLET | Freq: Two times a day (BID) | ORAL | 0 refills | Status: DC

## 2017-03-30 MED ORDER — METRONIDAZOLE 500 MG PO TABS
500.0000 mg | ORAL_TABLET | Freq: Once | ORAL | Status: AC
  Administered 2017-03-30: 500 mg via ORAL
  Filled 2017-03-30: qty 1

## 2017-03-30 NOTE — ED Notes (Signed)
Pelvic set up at bedside. Patient stated that she cant give urine sample at this time and requested water. RN ok'd water for patient.

## 2017-03-30 NOTE — ED Triage Notes (Signed)
Pt reports clear vag discharge. Slight odor, /states she had sex on Dec 27 unprotected.

## 2017-03-30 NOTE — ED Provider Notes (Signed)
[facility] DEPT Provider Note   CSN: [id number] Arrival date & time: 03/30/17  1010     History   Chief Complaint Chief Complaint  Patient presents with  . Vaginal Discharge    HPI Alisha Espinoza is a 51 y.o. female.  HPI   51 year old female with vaginal discharge.  Just noticed.  Denies any pain/discomfort.  No urinary complaints.  She did recently have unprotected vaginal intercourse only to be checked for STDs.  Past Medical History:  Diagnosis Date  . Bronchitis   . Heart murmur    "closed by age 3."    Patient Active Problem List   Diagnosis Date Noted  . Acute bronchitis 08/08/2015  . Leukocytosis 08/08/2015  . Hyperglycemia 08/08/2015  . Tobacco abuse 08/08/2015  . 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 BACTERIAL PNEUMONIA, RIGHT LOWER LOBE 04/27/2008  . LUMP OR MASS IN BREAST 04/27/2008  . COUGH 04/27/2008    Past Surgical History:  Procedure Laterality Date  . ABDOMINAL HYSTERECTOMY  2000  . 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 ANTERIOR CERVICAL DECOMP/DISCECTOMY FUSION N/A 05/07/2012   Procedure: ANTERIOR CERVICAL DECOMPRESSION/DISCECTOMY FUSION 1 LEVEL/HARDWARE REMOVAL;  Surgeon: Melina Schools, MD;  Location: [place];  Service: Orthopedics;  Laterality: N/A;  REMOVAL OF CERVICAL DISC REPLACEMENT AND ACDF C4-5  . CERVICAL FUSION  05/07/2012   Dr Rolena Infante  . CYSTECTOMY     L wrist  . ROTATOR CUFF REPAIR     Right  . TYMPANOSTOMY TUBE PLACEMENT      OB History    No data available       Home Medications    Prior to Admission medications   Medication Sig Start Date End Date Taking? Authorizing Provider  albuterol (PROVENTIL HFA;VENTOLIN HFA) 108 (90 Base) MCG/ACT inhaler Inhale 1-2 puffs into  the lungs every 4 (four) hours as needed for wheezing or shortness of breath (or coughing). 3/97/67  Yes Delora Fuel, MD  loratadine (CLARITIN) 10 MG tablet Take 10 mg by mouth daily.   Yes [provider]  amLODipine (NORVASC) 5 MG tablet Take 1 tablet (5 mg total) by mouth daily. Patient not taking: Reported on 10/25/2016 08/12/15   Regalado, Jerald Kief A, MD  HYDROcodone-homatropine (HYCODAN) 5-1.5 MG/5ML syrup Take 5 mLs by mouth every 6 (six) hours as needed for cough. FOR DISRUPTIVE NIGHT TIME COUGH AND BODY ACHES 11/01/16   Kinnie Feil, PA-C  metFORMIN (GLUCOPHAGE) 500 MG tablet Take 1 tablet (500 mg total) by mouth 2 (two) times daily with a meal. Patient not taking: Reported on 10/25/2016 08/12/15   Regalado, Jerald Kief A, MD  metroNIDAZOLE (FLAGYL) 500 MG tablet Take 1 tablet (500 mg total) by mouth 2 (two) times daily. 03/30/17   Virgel Manifold, MD  tiotropium (SPIRIVA HANDIHALER) 18 MCG inhalation capsule Place 1 capsule (18 mcg total) into inhaler and inhale every morning. 11/01/16   Kinnie Feil, PA-C    Family History Family History  Problem Relation Age of Onset  . Diabetes Mother   . Hypertension Father     Social History Social History   Tobacco Use  . Smoking status: Current Every Day Smoker    Packs/day: 0.50    Years: 20.00    Pack years: 10.00    Types: Cigarettes  . Smokeless tobacco: Never  Used  Substance Use Topics  . Alcohol use: Yes    Comment: occasional  . Drug use: No     Allergies   Meloxicam and Meloxicam   Review of Systems Review of Systems  All systems reviewed and negative, other than as noted in HPI.  Physical Exam Updated Vital Signs BP (!) 162/98 (BP Location: Left Arm)   Pulse (!) 103   Temp 98.3 F (36.8 C) (Oral)   SpO2 95%   Physical Exam  Constitutional: She appears well-developed and well-nourished. No distress.  HENT:  Head: Normocephalic and atraumatic.  Eyes: Conjunctivae are normal. Right eye exhibits no  discharge. Left eye exhibits no discharge.  Neck: Neck supple.  Cardiovascular: Normal rate, regular rhythm and normal heart sounds. Exam reveals no gallop and no friction rub.  No murmur heard. Pulmonary/Chest: Effort normal and breath sounds normal. No respiratory distress.  Abdominal: Soft. She exhibits no distension. There is no tenderness.  Genitourinary:  Genitourinary Comments: Chaperone present.  Normal external female genitalia.  No cervix was noted.  There is a small area appeared somewhat excoriated to the inferior portion of the vaginal wall.  There is no active bleeding.  No laceration.  There is mild to moderate whitish discharge.  Musculoskeletal: She exhibits no edema or tenderness.  Neurological: She is alert.  Skin: Skin is warm and dry.  Psychiatric: She has a normal mood and affect. Her behavior is normal. Thought content normal.  Nursing note and vitals reviewed.    ED Treatments / Results  Labs (all labs ordered are listed, but only abnormal results are displayed) Labs Reviewed  WET PREP, GENITAL - Abnormal; Notable for the following components:      Result Value   Clue Cells Wet Prep HPF POC PRESENT (*)    WBC, Wet Prep HPF POC MODERATE (*)    All other components within normal limits  URINALYSIS, ROUTINE W REFLEX MICROSCOPIC  GC/CHLAMYDIA PROBE AMP ([place]) NOT AT [facility]    EKG  EKG Interpretation None       Radiology No results found.  Procedures Procedures (including critical care time)  Medications Ordered in ED Medications  metroNIDAZOLE (FLAGYL) tablet 500 mg (not administered)  azithromycin (ZITHROMAX) tablet 1,000 mg (not administered)  cefTRIAXone (ROCEPHIN) injection 250 mg (not administered)  sterile water (preservative free) injection (not administered)     Initial Impression / Assessment and Plan / ED Course  I have reviewed the triage vital signs and the nursing notes.  Pertinent labs & imaging results that were available  during my care of the patient were reviewed by me and considered in my medical decision making (see chart for details).     51yF with discharge. Empiric GC tx per request. Flagyl for BV. Denies pain. Abdominal exam benign.   Final Clinical Impressions(s) / ED Diagnoses   Final diagnoses:  BV (bacterial vaginosis)    ED Discharge Orders        Ordered    metroNIDAZOLE (FLAGYL) 500 MG tablet  2 times daily     03/30/17 1310       Virgel Manifold, MD 03/30/17 1322

## 2017-03-31 LAB — GC/CHLAMYDIA PROBE AMP (~~LOC~~) NOT AT ARMC
Chlamydia: NEGATIVE
Neisseria Gonorrhea: NEGATIVE

## 2017-04-01 ENCOUNTER — Emergency Department (HOSPITAL_BASED_OUTPATIENT_CLINIC_OR_DEPARTMENT_OTHER)
Admission: EM | Admit: 2017-04-01 | Discharge: 2017-04-01 | Disposition: A | Discharge status: Home / Self Care | Payer: BLUE CROSS/BLUE SHIELD | Attending: Emergency Medicine | Admitting: Emergency Medicine

## 2017-04-01 ENCOUNTER — Encounter (HOSPITAL_BASED_OUTPATIENT_CLINIC_OR_DEPARTMENT_OTHER): Payer: Self-pay

## 2017-04-01 ENCOUNTER — Other Ambulatory Visit: Payer: Self-pay

## 2017-04-01 DIAGNOSIS — J209 Acute bronchitis, unspecified: Secondary | ICD-10-CM | POA: Diagnosis not present

## 2017-04-01 DIAGNOSIS — F17210 Nicotine dependence, cigarettes, uncomplicated: Secondary | ICD-10-CM | POA: Diagnosis not present

## 2017-04-01 DIAGNOSIS — Z79899 Other long term (current) drug therapy: Secondary | ICD-10-CM | POA: Insufficient documentation

## 2017-04-01 DIAGNOSIS — R05 Cough: Secondary | ICD-10-CM | POA: Diagnosis present

## 2017-04-01 MED ORDER — DEXAMETHASONE SODIUM PHOSPHATE 10 MG/ML IJ SOLN
10.0000 mg | Freq: Once | INTRAMUSCULAR | Status: AC
  Administered 2017-04-01: 10 mg via INTRAMUSCULAR
  Filled 2017-04-01: qty 1

## 2017-04-01 MED ORDER — ALBUTEROL SULFATE HFA 108 (90 BASE) MCG/ACT IN AERS
2.0000 | INHALATION_SPRAY | RESPIRATORY_TRACT | Status: DC | PRN
Start: 2017-04-01 — End: 2017-04-01
  Administered 2017-04-01: 2 via RESPIRATORY_TRACT
  Filled 2017-04-01: qty 6.7

## 2017-04-01 MED ORDER — IPRATROPIUM-ALBUTEROL 0.5-2.5 (3) MG/3ML IN SOLN
3.0000 mL | RESPIRATORY_TRACT | Status: DC
  Administered 2017-04-01: 3 mL via RESPIRATORY_TRACT
  Filled 2017-04-01: qty 3

## 2017-04-01 NOTE — ED Provider Notes (Signed)
[facility] DEPT [facility] Provider Note: Georgena Spurling, MD, FACEP  CSN: [id number] MRN: [id number] ARRIVAL: 04/01/17 at [place]: [place]  04/01/17 5:40 AM Alisha Espinoza is a 52 y.o. female with a history of bronchitis.  She is here with about 3 weeks of persistent cough, occasionally productive, with intermittent shortness of breath.  She does not currently have an albuterol inhaler.  Symptoms vary throughout the day and are somewhat worse with exertion.  They are not significantly worse when supine.  Symptoms have been moderate.  She denies fever or cold symptoms.  She is a smoker.   Past Medical History:  Diagnosis Date  . Bronchitis   . Heart murmur    "closed by age 3."    Past Surgical History:  Procedure Laterality Date  . ABDOMINAL HYSTERECTOMY  2000  . ANTERIOR CERVICAL DECOMP/DISCECTOMY FUSION  03/19/2012   Procedure: ANTERIOR CERVICAL DECOMPRESSION/DISCECTOMY FUSION 1 LEVEL;  Surgeon: Melina Schools, MD;  Location: 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 ANTERIOR CERVICAL DECOMP/DISCECTOMY FUSION N/A 05/07/2012   Procedure: ANTERIOR CERVICAL DECOMPRESSION/DISCECTOMY FUSION 1 LEVEL/HARDWARE REMOVAL;  Surgeon: Melina Schools, MD;  Location: [place];  Service: Orthopedics;  Laterality: N/A;  REMOVAL OF CERVICAL DISC REPLACEMENT AND ACDF C4-5  . CERVICAL FUSION  05/07/2012   Dr Rolena Infante  . CYSTECTOMY     L wrist  . ROTATOR CUFF REPAIR     Right  . TYMPANOSTOMY TUBE PLACEMENT      Family History  Problem Relation Age of Onset  . Diabetes Mother   . Hypertension Father     Social History   Tobacco Use  . Smoking status: Current Every Day Smoker    Packs/day: 0.50    Years: 20.00    Pack years: 10.00    Types: Cigarettes  . Smokeless tobacco: Never Used  Substance Use Topics  . Alcohol use: Yes    Comment: occasional  . Drug use: No    Prior to Admission  medications   Medication Sig Start Date End Date Taking? Authorizing Provider  albuterol (PROVENTIL HFA;VENTOLIN HFA) 108 (90 Base) MCG/ACT inhaler Inhale 1-2 puffs into the lungs every 4 (four) hours as needed for wheezing or shortness of breath (or coughing). 5/63/14   Delora Fuel, MD  amLODipine (NORVASC) 5 MG tablet Take 1 tablet (5 mg total) by mouth daily. Patient not taking: Reported on 10/25/2016 08/12/15   Regalado, Jerald Kief A, MD  HYDROcodone-homatropine (HYCODAN) 5-1.5 MG/5ML syrup Take 5 mLs by mouth every 6 (six) hours as needed for cough. FOR DISRUPTIVE NIGHT TIME COUGH AND BODY ACHES 11/01/16   Kinnie Feil, PA-C  loratadine (CLARITIN) 10 MG tablet Take 10 mg by mouth daily.    [provider]  metFORMIN (GLUCOPHAGE) 500 MG tablet Take 1 tablet (500 mg total) by mouth 2 (two) times daily with a meal. Patient not taking: Reported on 10/25/2016 08/12/15   Regalado, Jerald Kief A, MD  metroNIDAZOLE (FLAGYL) 500 MG tablet Take 1 tablet (500 mg total) by mouth 2 (two) times daily. 03/30/17   Virgel Manifold, MD  tiotropium (SPIRIVA HANDIHALER) 18 MCG inhalation capsule Place 1 capsule (18 mcg total) into inhaler and inhale every morning. 11/01/16   Kinnie Feil, PA-C    Allergies Meloxicam and Meloxicam   REVIEW OF SYSTEMS  Negative except as noted here or in the History of Present Illness.   PHYSICAL  EXAMINATION  Initial Vital Signs Blood pressure (!) 146/109, pulse 90, temperature 97.8 F (36.6 C), temperature source Oral, resp. rate 20, height 5\' 6"  (1.676 m), weight 88.9 kg (196 lb), SpO2 96 %.  Examination General: Well-developed, well-nourished female in no acute distress; appearance consistent with age of record HENT: normocephalic; atraumatic; pharynx normal Eyes: pupils equal, round and reactive to light; extraocular muscles intact Neck: supple Heart: regular rate and rhythm Lungs: Mild coarse sounds bilaterally Abdomen: soft; nondistended; nontender; bowel  sounds present Extremities: No deformity; full range of motion; pulses normal Neurologic: Awake, alert and oriented; motor function intact in all extremities and symmetric; no facial droop Skin: Warm and dry Psychiatric: Normal mood and affect   RESULTS  Summary of this visit's results, reviewed by myself:   EKG Interpretation  Date/Time:    Ventricular Rate:    PR Interval:    QRS Duration:   QT Interval:    QTC Calculation:   R Axis:     Text Interpretation:        Laboratory Studies: No results found for this or any previous visit (from the past 24 hour(s)). Imaging Studies: No results found.  ED COURSE  Nursing notes and initial vitals signs, including pulse oximetry, reviewed.  Vitals:   04/01/17 0528 04/01/17 0530 04/01/17 0531  BP: (!) 146/109    Pulse: 90    Resp: 20    Temp: 97.8 F (36.6 C)    TempSrc: Oral    SpO2: 94%  96%  Weight:  88.9 kg (196 lb)   Height:  5\' 6"  (1.676 m)     PROCEDURES    ED DIAGNOSES     ICD-10-CM   1. Acute bronchitis with bronchospasm J20.9        Caitlin Ainley, MD 04/01/17 [phone]

## 2017-04-01 NOTE — ED Triage Notes (Signed)
Pt reports cough and frequent bronchitis.

## 2017-04-14 ENCOUNTER — Emergency Department (HOSPITAL_COMMUNITY): Payer: BLUE CROSS/BLUE SHIELD

## 2017-04-14 ENCOUNTER — Encounter (HOSPITAL_COMMUNITY): Payer: Self-pay | Admitting: Emergency Medicine

## 2017-04-14 ENCOUNTER — Emergency Department (HOSPITAL_COMMUNITY)
Admission: EM | Admit: 2017-04-14 | Discharge: 2017-04-14 | Disposition: A | Discharge status: Home / Self Care | Payer: BLUE CROSS/BLUE SHIELD | Attending: Emergency Medicine | Admitting: Emergency Medicine

## 2017-04-14 ENCOUNTER — Other Ambulatory Visit: Payer: Self-pay

## 2017-04-14 DIAGNOSIS — F17210 Nicotine dependence, cigarettes, uncomplicated: Secondary | ICD-10-CM | POA: Insufficient documentation

## 2017-04-14 DIAGNOSIS — J209 Acute bronchitis, unspecified: Secondary | ICD-10-CM | POA: Insufficient documentation

## 2017-04-14 DIAGNOSIS — Z79899 Other long term (current) drug therapy: Secondary | ICD-10-CM | POA: Diagnosis not present

## 2017-04-14 DIAGNOSIS — J40 Bronchitis, not specified as acute or chronic: Secondary | ICD-10-CM

## 2017-04-14 DIAGNOSIS — R0981 Nasal congestion: Secondary | ICD-10-CM | POA: Diagnosis present

## 2017-04-14 MED ORDER — ACETAMINOPHEN 325 MG PO TABS
650.0000 mg | ORAL_TABLET | Freq: Once | ORAL | Status: AC
  Administered 2017-04-14: 650 mg via ORAL
  Filled 2017-04-14: qty 2

## 2017-04-14 MED ORDER — IPRATROPIUM-ALBUTEROL 0.5-2.5 (3) MG/3ML IN SOLN
3.0000 mL | Freq: Once | RESPIRATORY_TRACT | Status: AC
  Administered 2017-04-14: 3 mL via RESPIRATORY_TRACT
  Filled 2017-04-14: qty 3

## 2017-04-14 MED ORDER — PREDNISONE 10 MG PO TABS: ORAL_TABLET | ORAL | 0 refills | Status: DC

## 2017-04-14 MED ORDER — AZITHROMYCIN 250 MG PO TABS: 250.0000 mg | ORAL_TABLET | Freq: Every day | ORAL | 0 refills | Status: DC

## 2017-04-14 MED ORDER — ALBUTEROL SULFATE HFA 108 (90 BASE) MCG/ACT IN AERS
2.0000 | INHALATION_SPRAY | RESPIRATORY_TRACT | Status: DC
Start: 2017-04-14 — End: 2017-04-14
  Administered 2017-04-14: 2 via RESPIRATORY_TRACT
  Filled 2017-04-14: qty 6.7

## 2017-04-14 NOTE — ED Provider Notes (Signed)
Patient placed in Quick Look pathway, seen and evaluated for Chief Complaint of  Cough, nasal congestion, bodyaches with 3 elements HPI  ROS +nasal congestion, cough, bodyaches, headache, sick contacts  Physical Exam Vital signs reviewed Well developed, NAD Alert No respiratory distress but slight wheezing, coarse breath sounds noted in bilateral lung fields.   Based on initial evaluation, labs are not indicated and radiology studies are indicated.    Patient counseled on process, plan, and necessity for staying for completing the evaluation.   Delia Heady, PA-C 04/14/17 1232    Sherwood Gambler, MD 04/14/17 [phone]

## 2017-04-14 NOTE — ED Notes (Signed)
Pt is alert and oriented x 4 and is verbally responsive. Pt reports that she woke up with a productive cough. Pt has bilateral expiratory wheezes bilaterally.

## 2017-04-14 NOTE — ED Provider Notes (Signed)
[place] DEPT Provider Note   CSN: [id number] Arrival date & time: 04/14/17  1127     History   Chief Complaint Chief Complaint  Patient presents with  . Nasal Congestion    HPI Alisha Espinoza is a 52 y.o. female.  The history is provided by the patient. No language interpreter was used.  Cough  This is a new problem. The problem occurs constantly. The problem has been gradually worsening. The cough is productive of sputum. There has been no fever. Pertinent negatives include no chest pain. She has tried nothing for the symptoms. The treatment provided no relief. She is not a smoker. Her past medical history is significant for bronchitis. Her past medical history does not include pneumonia.  Pt reports she recently had a similar infection.  Pt complains of wheezing  Past Medical History:  Diagnosis Date  . Bronchitis   . Heart murmur    "closed by age 40."    Patient Active Problem List   Diagnosis Date Noted  . Acute bronchitis 08/08/2015  . Leukocytosis 08/08/2015  . Hyperglycemia 08/08/2015  . Tobacco abuse 08/08/2015  . 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 BACTERIAL PNEUMONIA, RIGHT LOWER LOBE 04/27/2008  . LUMP OR MASS IN BREAST 04/27/2008  . COUGH 04/27/2008    Past Surgical History:  Procedure Laterality Date  . ABDOMINAL HYSTERECTOMY  2000  . 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 ANTERIOR CERVICAL DECOMP/DISCECTOMY FUSION N/A 05/07/2012   Procedure: ANTERIOR CERVICAL DECOMPRESSION/DISCECTOMY FUSION 1 LEVEL/HARDWARE REMOVAL;  Surgeon: Melina Schools, MD;  Location: [place];  Service: Orthopedics;  Laterality: N/A;  REMOVAL OF CERVICAL DISC REPLACEMENT AND ACDF C4-5  . CERVICAL FUSION  05/07/2012   Dr Rolena Infante  .  CYSTECTOMY     L wrist  . ROTATOR CUFF REPAIR     Right  . TYMPANOSTOMY TUBE PLACEMENT      OB History    No data available       Home Medications    Prior to Admission medications   Medication Sig Start Date End Date Taking? Authorizing Provider  albuterol (PROVENTIL HFA;VENTOLIN HFA) 108 (90 Base) MCG/ACT inhaler Inhale 1-2 puffs into the lungs every 4 (four) hours as needed for wheezing or shortness of breath (or coughing). 07/09/79   Delora Fuel, MD  HYDROcodone-homatropine [facility]) 5-1.5 MG/5ML syrup Take 5 mLs by mouth every 6 (six) hours as needed for cough. FOR DISRUPTIVE NIGHT TIME COUGH AND BODY ACHES 11/01/16   Kinnie Feil, PA-C  loratadine (CLARITIN) 10 MG tablet Take 10 mg by mouth daily.    [provider]  metroNIDAZOLE (FLAGYL) 500 MG tablet Take 1 tablet (500 mg total) by mouth 2 (two) times daily. 03/30/17   Virgel Manifold, MD  tiotropium (SPIRIVA HANDIHALER) 18 MCG inhalation capsule Place 1 capsule (18 mcg total) into inhaler and inhale every morning. 11/01/16   Kinnie Feil, PA-C    Family History Family History  Problem Relation Age of Onset  . Diabetes Mother   . Hypertension Father     Social History Social History   Tobacco Use  . Smoking status: Current Every Day Smoker    Packs/day: 0.50    Years: 20.00    Pack years: 10.00    Types: Cigarettes  . Smokeless tobacco: Never Used  Substance Use Topics  . Alcohol use: Yes    Comment: occasional  . Drug use: No     Allergies   Meloxicam   Review of Systems Review of Systems  Respiratory: Positive for cough.   Cardiovascular: Negative for chest pain.  All other systems reviewed and are negative.    Physical Exam Updated Vital Signs BP 107/79 (BP Location: Right Arm)   Pulse (!) 104   Temp 98.4 F (36.9 C) (Oral)   Resp (!) 22   Ht 5\' 6"  (1.676 m)   Wt 88.9 kg (196 lb)   SpO2 100%   BMI 31.64 kg/m   Physical Exam  Constitutional: She is oriented to  person, place, and time. She appears well-developed and well-nourished.  HENT:  Head: Normocephalic.  Right Ear: External ear normal.  Left Ear: External ear normal.  Eyes: EOM are normal. Pupils are equal, round, and reactive to light.  Neck: Normal range of motion.  Cardiovascular: Normal rate and regular rhythm.  Pulmonary/Chest: Effort normal. She has wheezes.  No whezzing post neb.    Abdominal: She exhibits no distension.  Musculoskeletal: Normal range of motion.  Neurological: She is alert and oriented to person, place, and time.  Psychiatric: She has a normal mood and affect.  Nursing note and vitals reviewed.    ED Treatments / Results  Labs (all labs ordered are listed, but only abnormal results are displayed) Labs Reviewed - No data to display  EKG  EKG Interpretation None       Radiology Dg Chest 2 View  Result Date: 04/14/2017 CLINICAL DATA:  Cough and wheezing EXAM: CHEST  2 VIEW COMPARISON:  November 01, 2016 FINDINGS: There is no edema or consolidation. The heart size and pulmonary vascularity are normal. No adenopathy. There is postoperative change in the lower cervical spine. 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 IMPRESSION: No edema or consolidation. Electronically Signed   By: Lowella Grip III M.D.   On: 04/14/2017 13:33    Procedures Procedures (including critical care time)  Medications Ordered in ED Medications  ipratropium-albuterol (DUONEB) 0.5-2.5 (3) MG/3ML nebulizer solution 3 mL (3 mLs Nebulization Given 04/14/17 1253)  acetaminophen (TYLENOL) tablet 650 mg (650 mg Oral Given 04/14/17 1253)     Initial Impression / Assessment and Plan / ED Course  I have reviewed the triage vital signs and the nursing notes.  Pertinent labs & imaging results that were available during my care of the patient were reviewed by me and considered in my medical decision making (see chart for details).     Pt lungs sound good, chest xray  shows no pneumonia. Pt advised of need for on going primary care.   Pt advised given rx for  Meds ordered this encounter  Medications  . ipratropium-albuterol (DUONEB) 0.5-2.5 (3) MG/3ML nebulizer solution 3 mL  . acetaminophen (TYLENOL) tablet 650 mg  . predniSONE (DELTASONE) 10 MG tablet    Sig: 6,5,4,3,2,1    Dispense:  21 tablet    Refill:  0    Order Specific Question:   Supervising Provider    Answer:   MILLER, BRIAN [[id number]]  . azithromycin (ZITHROMAX) 250 MG tablet    Sig: Take 1 tablet (250 mg total) by mouth daily. Take first 2 tablets together, then 1 every day until finished.    Dispense:  6 tablet    Refill:  0    Order Specific Question:   Supervising Provider    Answer:  [place], [place]  . albuterol (PROVENTIL HFA;VENTOLIN HFA) 108 (90 Base) MCG/ACT inhaler 2 puff    Final Clinical Impressions(s) / ED Diagnoses   Final diagnoses:  Bronchitis    ED Discharge Orders    None    An After Visit Summary was printed and given to the patient.    Fransico Meadow, [place] 04/14/17 [place], MD 04/14/17 [phone]

## 2017-04-14 NOTE — Discharge Instructions (Signed)
See your Physician for recheck in 3-4 days °

## 2017-04-14 NOTE — ED Notes (Signed)
Bed: WTR9 Expected date:  Expected time:  Means of arrival:  Comments: 

## 2017-04-14 NOTE — ED Triage Notes (Signed)
Patient c/o congestion with cough that started today. Had sore throat that got better with Chloraseptic.

## 2017-04-15 ENCOUNTER — Encounter (HOSPITAL_COMMUNITY): Payer: Self-pay

## 2017-04-15 ENCOUNTER — Other Ambulatory Visit: Payer: Self-pay

## 2017-04-15 ENCOUNTER — Emergency Department (HOSPITAL_COMMUNITY)
Admission: EM | Admit: 2017-04-15 | Discharge: 2017-04-15 | Disposition: A | Discharge status: Home / Self Care | Payer: BLUE CROSS/BLUE SHIELD | Attending: Emergency Medicine | Admitting: Emergency Medicine

## 2017-04-15 DIAGNOSIS — J069 Acute upper respiratory infection, unspecified: Secondary | ICD-10-CM | POA: Diagnosis not present

## 2017-04-15 DIAGNOSIS — F17210 Nicotine dependence, cigarettes, uncomplicated: Secondary | ICD-10-CM | POA: Insufficient documentation

## 2017-04-15 DIAGNOSIS — Z79899 Other long term (current) drug therapy: Secondary | ICD-10-CM | POA: Diagnosis not present

## 2017-04-15 DIAGNOSIS — R05 Cough: Secondary | ICD-10-CM | POA: Diagnosis present

## 2017-04-15 MED ORDER — IPRATROPIUM-ALBUTEROL 0.5-2.5 (3) MG/3ML IN SOLN
3.0000 mL | Freq: Once | RESPIRATORY_TRACT | Status: AC
  Administered 2017-04-15: 3 mL via RESPIRATORY_TRACT
  Filled 2017-04-15: qty 3

## 2017-04-15 MED ORDER — ALBUTEROL SULFATE (2.5 MG/3ML) 0.083% IN NEBU
5.0000 mg | INHALATION_SOLUTION | Freq: Once | RESPIRATORY_TRACT | Status: AC
  Administered 2017-04-15: 5 mg via RESPIRATORY_TRACT
  Filled 2017-04-15: qty 6

## 2017-04-15 MED ORDER — BENZONATATE 100 MG PO CAPS: 100.0000 mg | ORAL_CAPSULE | Freq: Three times a day (TID) | ORAL | 0 refills | Status: DC | PRN

## 2017-04-15 NOTE — Discharge Instructions (Signed)
Please read attached information. If you experience any new or worsening signs or symptoms please return to the emergency room for evaluation. Please follow-up with your primary care provider or specialist as discussed. Please use medication prescribed only as directed and discontinue taking if you have any concerning signs or symptoms.   °

## 2017-04-15 NOTE — ED Provider Notes (Signed)
Care assumed from [facility], PA-C, pending repeat albuterol nebulizer and reevaluation. See his note for full HPI and workup.  Briefly, patient presenting today for URI, with diagnosis of bronchitis.  Patient seen multiple times for similar complaints with negative chest x-ray.  Patient wheezing today on exam, per [place], PA-C.  DuoNeb was given with some improvement in symptoms.  Plan to give albuterol neb and reevaluate with anticipated discharge.   Patient given albuterol neb and reevaluated.  On my exam, mild wheezing noted bilaterally, however with good air movement.  No increased work of breathing or tachypnea.  Patient requesting something to help her sleep at night with her cough.  Will prescribe Tessalon.  Also recommended she continue the prednisone and albuterol inhaler as previously prescribed.  Pt is well-appearing and safe for discharge at this time.   Alisha Espinoza, Alisha N, PA-C 04/15/17 2231    Charlesetta Shanks, MD 04/21/17 [phone]

## 2017-04-15 NOTE — ED Triage Notes (Signed)
Pt presents with 1 week h/o productive cough and wheezing.  Pt was seen at [facility] x 2 days ago, given inhaler and antibiotics with symptoms worsening.  Pt reports having air freshener in her car that was too strong.

## 2017-04-15 NOTE — ED Provider Notes (Signed)
[facility] EMERGENCY DEPARTMENT Provider Note   CSN: [id number] Arrival date & time: 04/15/17  2021     History   Chief Complaint No chief complaint on file.   HPI Kaitlinn Iversen is a 52 y.o. female.  HPI   52 year old female presents today with complaints of upper respiratory congestion.  Patient notes approximately 3 days ago she developed cough, nasal congestion, chest congestion.  She denies any fevers at home.  She has been seen 3 days in a row for similar symptoms.  She was prescribed azithromycin, prednisone and an inhaler for this.  She notes symptoms have not improved with continued wheezing.  Patient reports she is a smoker, no history of asthma in the past.  Past Medical History:  Diagnosis Date  . Bronchitis   . Heart murmur    "closed by age 19."    Patient Active Problem List   Diagnosis Date Noted  . Acute bronchitis 08/08/2015  . Leukocytosis 08/08/2015  . Hyperglycemia 08/08/2015  . Tobacco abuse 08/08/2015  . 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 BACTERIAL PNEUMONIA, RIGHT LOWER LOBE 04/27/2008  . LUMP OR MASS IN BREAST 04/27/2008  . COUGH 04/27/2008    Past Surgical History:  Procedure Laterality Date  . ABDOMINAL HYSTERECTOMY  2000  . 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 ANTERIOR CERVICAL DECOMP/DISCECTOMY FUSION N/A 05/07/2012   Procedure: ANTERIOR CERVICAL DECOMPRESSION/DISCECTOMY FUSION 1 LEVEL/HARDWARE REMOVAL;  Surgeon: Melina Schools, MD;  Location: [place];  Service: Orthopedics;  Laterality: N/A;  REMOVAL OF CERVICAL DISC REPLACEMENT AND ACDF C4-5  . CERVICAL FUSION  05/07/2012   Dr Rolena Infante  . CYSTECTOMY     L wrist  . ROTATOR CUFF REPAIR     Right  . TYMPANOSTOMY TUBE PLACEMENT      OB History    No data available       Home Medications    Prior to Admission medications   Medication Sig Start Date End Date Taking? Authorizing Provider  albuterol (PROVENTIL HFA;VENTOLIN HFA) 108 (90 Base) MCG/ACT inhaler Inhale 1-2 puffs into the lungs every 4 (four) hours as needed for wheezing or shortness of breath (or coughing). 06/14/15   Delora Fuel, MD  azithromycin (ZITHROMAX) 250 MG tablet Take 1 tablet (250 mg total) by mouth daily. Take first 2 tablets together, then 1 every day until finished. 04/14/17   Fransico Meadow, PA-C  HYDROcodone-homatropine (HYCODAN) 5-1.5 MG/5ML syrup Take 5 mLs by mouth every 6 (six) hours as needed for cough. FOR DISRUPTIVE NIGHT TIME COUGH AND BODY ACHES 11/01/16   Kinnie Feil, PA-C  loratadine (CLARITIN) 10 MG tablet Take 10 mg by mouth daily.    [provider]  metroNIDAZOLE (FLAGYL) 500 MG tablet Take 1 tablet (500 mg total) by mouth 2 (two) times daily. 03/30/17   Virgel Manifold, MD  predniSONE (DELTASONE) 10 MG tablet 6,5,4,3,2,1 04/14/17   Fransico Meadow, PA-C  tiotropium (SPIRIVA HANDIHALER) 18 MCG inhalation capsule Place 1 capsule (18 mcg total) into inhaler and inhale every morning. 11/01/16   Kinnie Feil, PA-C    Family History Family History  Problem Relation Age of Onset  . Diabetes Mother   . Hypertension Father     Social History Social History   Tobacco Use  . Smoking status: Current Every  Day Smoker    Packs/day: 0.50    Years: 20.00    Pack years: 10.00    Types: Cigarettes  . Smokeless tobacco: Never Used  Substance Use Topics  . Alcohol use: Yes    Comment: occasional  . Drug use: No     Allergies   Meloxicam   Review of Systems Review of Systems  All other systems reviewed and are negative.    Physical Exam Updated Vital Signs BP (!) 166/109 (BP Location: Right Arm)   Pulse 94   Temp 98.3 F (36.8 C) (Oral)   Resp 20   SpO2 100%   Physical Exam  Constitutional: She is oriented to  person, place, and time. She appears well-developed and well-nourished.  HENT:  Head: Normocephalic and atraumatic.  Eyes: Conjunctivae are normal. Pupils are equal, round, and reactive to light. Right eye exhibits no discharge. Left eye exhibits no discharge. No scleral icterus.  Neck: Normal range of motion. No JVD present. No tracheal deviation present.  Pulmonary/Chest: Effort normal. No stridor. No respiratory distress. She has wheezes. She has no rales. She exhibits no tenderness.  Bilateral expiratory wheeze  Neurological: She is alert and oriented to person, place, and time. Coordination normal.  Psychiatric: She has a normal mood and affect. Her behavior is normal. Judgment and thought content normal.  Nursing note and vitals reviewed.    ED Treatments / Results  Labs (all labs ordered are listed, but only abnormal results are displayed) Labs Reviewed - No data to display  EKG  EKG Interpretation None       Radiology Dg Chest 2 View  Result Date: 04/14/2017 CLINICAL DATA:  Cough and wheezing EXAM: CHEST  2 VIEW COMPARISON:  November 01, 2016 FINDINGS: There is no edema or consolidation. The heart size and pulmonary vascularity are normal. No adenopathy. There is postoperative change in the lower cervical spine. 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 IMPRESSION: No edema or consolidation. Electronically Signed   By: Lowella Grip III M.D.   On: 04/14/2017 13:33    Procedures Procedures (including critical care time)  Medications Ordered in ED Medications  ipratropium-albuterol (DUONEB) 0.5-2.5 (3) MG/3ML nebulizer solution 3 mL (not administered)     Initial Impression / Assessment and Plan / ED Course  I have reviewed the triage vital signs and the nursing notes.  Pertinent labs & imaging results that were available during my care of the patient were reviewed by me and considered in my medical decision making (see chart for details).      Final Clinical Impressions(s) / ED Diagnoses   Final diagnoses:  Upper respiratory tract infection, unspecified type    52 year old female presents today with upper respiratory infection.  Patient has been seen 3 times in the last 3 days for her present symptoms.  She had a chest x-ray yesterday showing no acute findings.  Patient is afebrile with 100% oxygen saturation.  On exam she does have bilateral wheeze, no crackles.  I have low suspicion for acute bacterial etiology in this patient, she is a smoker with likely bronchitis.  Patient will be given a breathing treatment here, reassessed, based on my initial assessment I anticipate discharge home with continued symptomatic care and strict return precautions.   Pt care signed out to oncoming provider pending reassessment and disposition   ED Discharge Orders    None       Francee Gentile 04/15/17 2049    Charlesetta Shanks, MD 04/24/17 1318

## 2017-06-23 ENCOUNTER — Other Ambulatory Visit: Payer: Self-pay | Admitting: Family Medicine

## 2017-06-23 DIAGNOSIS — H6523 Chronic serous otitis media, bilateral: Secondary | ICD-10-CM | POA: Diagnosis not present

## 2017-06-23 DIAGNOSIS — R221 Localized swelling, mass and lump, neck: Secondary | ICD-10-CM | POA: Diagnosis not present

## 2017-06-23 DIAGNOSIS — M25521 Pain in right elbow: Secondary | ICD-10-CM | POA: Diagnosis not present

## 2017-06-23 DIAGNOSIS — M7701 Medial epicondylitis, right elbow: Secondary | ICD-10-CM | POA: Diagnosis not present

## 2017-06-25 ENCOUNTER — Encounter: Payer: Self-pay | Admitting: Gastroenterology

## 2017-06-26 ENCOUNTER — Other Ambulatory Visit: Payer: Self-pay | Admitting: Family Medicine

## 2017-06-26 DIAGNOSIS — Z1231 Encounter for screening mammogram for malignant neoplasm of breast: Secondary | ICD-10-CM

## 2017-07-01 ENCOUNTER — Ambulatory Visit
Admission: RE | Admit: 2017-07-01 | Discharge: 2017-07-01 | Disposition: A | Discharge status: Home / Self Care | Payer: BLUE CROSS/BLUE SHIELD | Source: Ambulatory Visit | Attending: Family Medicine | Admitting: Family Medicine

## 2017-07-01 DIAGNOSIS — R221 Localized swelling, mass and lump, neck: Secondary | ICD-10-CM

## 2017-07-21 DIAGNOSIS — Z0000 Encounter for general adult medical examination without abnormal findings: Secondary | ICD-10-CM | POA: Diagnosis not present

## 2017-07-21 DIAGNOSIS — Z114 Encounter for screening for human immunodeficiency virus [HIV]: Secondary | ICD-10-CM | POA: Diagnosis not present

## 2017-07-21 DIAGNOSIS — Z1329 Encounter for screening for other suspected endocrine disorder: Secondary | ICD-10-CM | POA: Diagnosis not present

## 2017-07-21 DIAGNOSIS — E782 Mixed hyperlipidemia: Secondary | ICD-10-CM | POA: Diagnosis not present

## 2017-07-23 DIAGNOSIS — Z6832 Body mass index (BMI) 32.0-32.9, adult: Secondary | ICD-10-CM | POA: Diagnosis not present

## 2017-07-23 DIAGNOSIS — Z01419 Encounter for gynecological examination (general) (routine) without abnormal findings: Secondary | ICD-10-CM | POA: Diagnosis not present

## 2017-07-23 DIAGNOSIS — Z118 Encounter for screening for other infectious and parasitic diseases: Secondary | ICD-10-CM | POA: Diagnosis not present

## 2017-07-23 DIAGNOSIS — Z0000 Encounter for general adult medical examination without abnormal findings: Secondary | ICD-10-CM | POA: Diagnosis not present

## 2017-07-23 DIAGNOSIS — N760 Acute vaginitis: Secondary | ICD-10-CM | POA: Diagnosis not present

## 2017-07-24 ENCOUNTER — Ambulatory Visit
Admission: RE | Admit: 2017-07-24 | Discharge: 2017-07-24 | Disposition: A | Discharge status: Home / Self Care | Payer: BLUE CROSS/BLUE SHIELD | Source: Ambulatory Visit | Attending: Family Medicine | Admitting: Family Medicine

## 2017-07-24 DIAGNOSIS — Z1231 Encounter for screening mammogram for malignant neoplasm of breast: Secondary | ICD-10-CM | POA: Diagnosis not present

## 2017-07-25 ENCOUNTER — Other Ambulatory Visit: Payer: Self-pay | Admitting: Family Medicine

## 2017-07-25 DIAGNOSIS — R928 Other abnormal and inconclusive findings on diagnostic imaging of breast: Secondary | ICD-10-CM

## 2017-08-11 ENCOUNTER — Inpatient Hospital Stay
Admission: RE | Admit: 2017-08-11 | Discharge: 2017-08-11 | Disposition: A | Discharge status: Home / Self Care | Payer: BLUE CROSS/BLUE SHIELD | Source: Ambulatory Visit | Attending: Family Medicine | Admitting: Family Medicine

## 2017-08-13 ENCOUNTER — Ambulatory Visit (AMBULATORY_SURGERY_CENTER): Payer: Self-pay | Admitting: *Deleted

## 2017-08-13 ENCOUNTER — Other Ambulatory Visit: Payer: Self-pay

## 2017-08-13 VITALS — Ht 63.0 in | Wt 198.4 lb

## 2017-08-13 DIAGNOSIS — Z1211 Encounter for screening for malignant neoplasm of colon: Secondary | ICD-10-CM

## 2017-08-13 MED ORDER — SUPREP BOWEL PREP KIT 17.5-3.13-1.6 GM/177ML PO SOLN: 1.0000 | Freq: Once | ORAL | 0 refills | Status: AC

## 2017-08-13 NOTE — Progress Notes (Signed)
Patient denies any allergies to egg or soy products. Patient denies complications with anesthesia/sedation.  Patient denies oxygen use at home and denies diet medications. Pamphlet for colonoscopy given to patient.

## 2017-08-14 ENCOUNTER — Emergency Department (HOSPITAL_COMMUNITY)
Admission: EM | Admit: 2017-08-14 | Discharge: 2017-08-14 | Disposition: A | Discharge status: Home / Self Care | Payer: BLUE CROSS/BLUE SHIELD | Attending: Emergency Medicine | Admitting: Emergency Medicine

## 2017-08-14 ENCOUNTER — Encounter (HOSPITAL_BASED_OUTPATIENT_CLINIC_OR_DEPARTMENT_OTHER): Payer: Self-pay | Admitting: *Deleted

## 2017-08-14 ENCOUNTER — Emergency Department (HOSPITAL_BASED_OUTPATIENT_CLINIC_OR_DEPARTMENT_OTHER)
Admission: EM | Admit: 2017-08-14 | Discharge: 2017-08-14 | Disposition: A | Discharge status: Home / Self Care | Payer: BLUE CROSS/BLUE SHIELD | Source: Home / Self Care | Attending: Emergency Medicine | Admitting: Emergency Medicine

## 2017-08-14 ENCOUNTER — Other Ambulatory Visit: Payer: Self-pay

## 2017-08-14 ENCOUNTER — Encounter (HOSPITAL_COMMUNITY): Payer: Self-pay | Admitting: *Deleted

## 2017-08-14 DIAGNOSIS — F17210 Nicotine dependence, cigarettes, uncomplicated: Secondary | ICD-10-CM | POA: Insufficient documentation

## 2017-08-14 DIAGNOSIS — Z5321 Procedure and treatment not carried out due to patient leaving prior to being seen by health care provider: Secondary | ICD-10-CM | POA: Insufficient documentation

## 2017-08-14 DIAGNOSIS — Z79899 Other long term (current) drug therapy: Secondary | ICD-10-CM | POA: Insufficient documentation

## 2017-08-14 DIAGNOSIS — K0889 Other specified disorders of teeth and supporting structures: Secondary | ICD-10-CM

## 2017-08-14 MED ORDER — HYDROCODONE-ACETAMINOPHEN 5-325 MG PO TABS: 1.0000 | ORAL_TABLET | Freq: Four times a day (QID) | ORAL | 0 refills | Status: DC | PRN

## 2017-08-14 MED ORDER — PENICILLIN V POTASSIUM 500 MG PO TABS: 500.0000 mg | ORAL_TABLET | Freq: Three times a day (TID) | ORAL | 0 refills | Status: DC

## 2017-08-14 MED ORDER — IBUPROFEN 800 MG PO TABS
800.0000 mg | ORAL_TABLET | Freq: Once | ORAL | Status: AC
  Administered 2017-08-14: 800 mg via ORAL
  Filled 2017-08-14: qty 1

## 2017-08-14 MED ORDER — HYDROCODONE-ACETAMINOPHEN 5-325 MG PO TABS
2.0000 | ORAL_TABLET | Freq: Once | ORAL | Status: DC
  Filled 2017-08-14: qty 2

## 2017-08-14 MED ORDER — IBUPROFEN 600 MG PO TABS: 600.0000 mg | ORAL_TABLET | Freq: Four times a day (QID) | ORAL | 0 refills | Status: DC | PRN

## 2017-08-14 NOTE — ED Triage Notes (Signed)
Pt c/o lower dental pain for about 2 weeks, states last took tylenol around 2100 for the pain. Denies fevers. Reports has a dentist appointment on Friday.

## 2017-08-14 NOTE — ED Provider Notes (Signed)
[facility] EMERGENCY DEPARTMENT Provider Note   CSN: [id number] Arrival date & time: 08/14/17  0204     History   Chief Complaint Chief Complaint  Patient presents with  . Dental Pain    HPI Alisha Espinoza is a 52 y.o. female.  Patient is a 52 year old female presenting with complaints of dental pain.  This is been ongoing for the past 2 weeks, but significantly worsened this evening.  She has an appointment to see her dentist tomorrow.  She denies any fevers or chills.  She denies any throat swelling or difficulty breathing or swallowing.  The history is provided by the patient.  Dental Pain   This is a new problem. Episode onset: 2 weeks ago. The problem occurs constantly. The problem has been rapidly worsening. The pain is severe. She has tried nothing for the symptoms.    Past Medical History:  Diagnosis Date  . Bronchitis   . Heart murmur    resolved "closed by age 35."   . Seasonal allergies     Patient Active Problem List   Diagnosis Date Noted  . Acute bronchitis 08/08/2015  . Leukocytosis 08/08/2015  . Hyperglycemia 08/08/2015  . Tobacco abuse 08/08/2015  . 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 BACTERIAL PNEUMONIA, RIGHT LOWER LOBE 04/27/2008  . LUMP OR MASS IN BREAST 04/27/2008  . COUGH 04/27/2008    Past Surgical History:  Procedure Laterality Date  . ABDOMINAL HYSTERECTOMY  2000  . 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 ANTERIOR CERVICAL DECOMP/DISCECTOMY FUSION N/A 05/07/2012   Procedure: ANTERIOR CERVICAL DECOMPRESSION/DISCECTOMY FUSION 1 LEVEL/HARDWARE REMOVAL;  Surgeon: Melina Schools, MD;  Location: [place];  Service: Orthopedics;  Laterality: N/A;  REMOVAL OF CERVICAL DISC REPLACEMENT AND ACDF C4-5  . CERVICAL FUSION   05/07/2012   Dr Rolena Infante  . CYSTECTOMY     L wrist  . ROTATOR CUFF REPAIR     Right  . TYMPANOSTOMY TUBE PLACEMENT       OB History   None      Home Medications    Prior to Admission medications   Medication Sig Start Date End Date Taking? Authorizing Provider  albuterol (PROVENTIL HFA;VENTOLIN HFA) 108 (90 Base) MCG/ACT inhaler Inhale 1-2 puffs into the lungs every 4 (four) hours as needed for wheezing or shortness of breath (or coughing). 3/81/01   Delora Fuel, MD  capsaicin (ZOSTRIX) 7.510 % cream 1 APPLICATION TOPICALLY TO AFFECTED AREA 3 TIMES PER DAY AS NEEDED FOR PAIN 06/24/17   [provider]  fluticasone (FLONASE) 50 MCG/ACT nasal spray SPRAY 2 SPRAYS INTO EACH NOSTRIL EVERY DAY(NON-PARTICIPATING PHARMACY) 06/28/17   [provider]  loratadine (CLARITIN) 10 MG tablet Take 10 mg by mouth daily.    [provider]    Family History Family History  Problem Relation Age of Onset  . Diabetes Mother   . Hypertension Father   . Colon cancer Neg Hx   . Rectal cancer Neg Hx   . Stomach cancer Neg Hx     Social History Social History   Tobacco Use  . Smoking status: Current Every Day Smoker    Packs/day: 0.50    Years: 30.00    Pack years: 15.00    Types: Cigarettes  . Smokeless tobacco: Never Used  Substance Use Topics  .  Alcohol use: Yes    Comment: occasional  . Drug use: No     Allergies   Meloxicam   Review of Systems Review of Systems  All other systems reviewed and are negative.    Physical Exam Updated Vital Signs BP (!) 151/102 (BP Location: Right Arm)   Pulse 81   Temp 97.7 F (36.5 C) (Oral)   Resp 18   Ht 5\' 6"  (1.676 m)   Wt 89.8 kg (198 lb)   SpO2 100%   BMI 31.96 kg/m   Physical Exam  Constitutional: She is oriented to person, place, and time. She appears well-developed and well-nourished. No distress.  HENT:  Head: Normocephalic and atraumatic.  Mouth/Throat: Oropharynx is clear and moist.  The  right and left molars appear to have some decay however no obvious abscess.  There is some surrounding gingival inflammation.  Neck: Normal range of motion. Neck supple.  Pulmonary/Chest: Effort normal.  Musculoskeletal: Normal range of motion.  Lymphadenopathy:    She has no cervical adenopathy.  Neurological: She is alert and oriented to person, place, and time.  Skin: She is not diaphoretic.  Nursing note and vitals reviewed.    ED Treatments / Results  Labs (all labs ordered are listed, but only abnormal results are displayed) Labs Reviewed - No data to display  EKG None  Radiology No results found.  Procedures Procedures (including critical care time)  Medications Ordered in ED Medications  HYDROcodone-acetaminophen (NORCO/VICODIN) 5-325 MG per tablet 2 tablet (has no administration in time range)     Initial Impression / Assessment and Plan / ED Course  I have reviewed the triage vital signs and the nursing notes.  Pertinent labs & imaging results that were available during my care of the patient were reviewed by me and considered in my medical decision making (see chart for details).  Will treat with pain meds, antibiotics.  She has an appointment with her dentist on Friday.  Final Clinical Impressions(s) / ED Diagnoses   Final diagnoses:  None    ED Discharge Orders    None       Veryl Speak, MD 08/14/17 [phone]

## 2017-08-14 NOTE — ED Notes (Signed)
Pt states that they are leaving due to wait times

## 2017-08-14 NOTE — Discharge Instructions (Addendum)
Penicillin as prescribed.  Ibuprofen 600mg  every six hours as prescribed as needed for pain.  Follow-up with your dentist on Friday as scheduled.

## 2017-08-14 NOTE — ED Triage Notes (Signed)
The pt  Is c/o a toothache for 2 weeks  She has taken  Tylenol but that has not helped  She has a dentist apppointment this friday

## 2017-08-22 ENCOUNTER — Inpatient Hospital Stay: Admission: RE | Admit: 2017-08-22 | Payer: BLUE CROSS/BLUE SHIELD | Source: Ambulatory Visit

## 2017-08-27 ENCOUNTER — Other Ambulatory Visit: Payer: Self-pay

## 2017-08-27 ENCOUNTER — Ambulatory Visit (AMBULATORY_SURGERY_CENTER): Payer: BLUE CROSS/BLUE SHIELD | Admitting: Gastroenterology

## 2017-08-27 ENCOUNTER — Encounter: Payer: Self-pay | Admitting: Gastroenterology

## 2017-08-27 VITALS — BP 118/76 | HR 77 | Temp 97.7°F | Resp 15 | Ht 63.0 in | Wt 198.0 lb

## 2017-08-27 DIAGNOSIS — D128 Benign neoplasm of rectum: Secondary | ICD-10-CM

## 2017-08-27 DIAGNOSIS — Z1211 Encounter for screening for malignant neoplasm of colon: Secondary | ICD-10-CM

## 2017-08-27 DIAGNOSIS — D129 Benign neoplasm of anus and anal canal: Secondary | ICD-10-CM

## 2017-08-27 DIAGNOSIS — D123 Benign neoplasm of transverse colon: Secondary | ICD-10-CM

## 2017-08-27 DIAGNOSIS — D127 Benign neoplasm of rectosigmoid junction: Secondary | ICD-10-CM

## 2017-08-27 DIAGNOSIS — K621 Rectal polyp: Secondary | ICD-10-CM | POA: Diagnosis not present

## 2017-08-27 DIAGNOSIS — D125 Benign neoplasm of sigmoid colon: Secondary | ICD-10-CM | POA: Diagnosis not present

## 2017-08-27 DIAGNOSIS — D122 Benign neoplasm of ascending colon: Secondary | ICD-10-CM

## 2017-08-27 MED ORDER — SODIUM CHLORIDE 0.9 % IV SOLN: 500.0000 mL | Freq: Once | INTRAVENOUS | Status: DC

## 2017-08-27 NOTE — Progress Notes (Signed)
Report given to PACU, vss 

## 2017-08-27 NOTE — Patient Instructions (Signed)
**   Handouts given on polyps and hemorrhoids  YOU HAD AN ENDOSCOPIC PROCEDURE TODAY AT THE [facility]:   Refer to the procedure report that was given to you for any specific questions about what was found during the examination.  If the procedure report does not answer your questions, please call your gastroenterologist to clarify.  If you requested that your care partner not be given the details of your procedure findings, then the procedure report has been included in a sealed envelope for you to review at your convenience later.  YOU SHOULD EXPECT: Some feelings of bloating in the abdomen. Passage of more gas than usual.  Walking can help get rid of the air that was put into your GI tract during the procedure and reduce the bloating. If you had a lower endoscopy (such as a colonoscopy or flexible sigmoidoscopy) you may notice spotting of blood in your stool or on the toilet paper. If you underwent a bowel prep for your procedure, you may not have a normal bowel movement for a few days.  Please Note:  You might notice some irritation and congestion in your nose or some drainage.  This is from the oxygen used during your procedure.  There is no need for concern and it should clear up in a day or so.  SYMPTOMS TO REPORT IMMEDIATELY:   Following lower endoscopy (colonoscopy or flexible sigmoidoscopy):  Excessive amounts of blood in the stool  Significant tenderness or worsening of abdominal pains  Swelling of the abdomen that is new, acute  Fever of 100F or higher   For urgent or emergent issues, a gastroenterologist can be reached at any hour by calling [phone].   DIET:  We do recommend a small meal at first, but then you may proceed to your regular diet.  Drink plenty of fluids but you should avoid alcoholic beverages for 24 hours.  ACTIVITY:  You should plan to take it easy for the rest of today and you should NOT DRIVE or use heavy machinery until tomorrow (because of the  sedation medicines used during the test).    FOLLOW UP: Our staff will call the number listed on your records the next business day following your procedure to check on you and address any questions or concerns that you may have regarding the information given to you following your procedure. If we do not reach you, we will leave a message.  However, if you are feeling well and you are not experiencing any problems, there is no need to return our call.  We will assume that you have returned to your regular daily activities without incident.  If any biopsies were taken you will be contacted by phone or by letter within the next 1-3 weeks.  Please call us at [phone] if you have not heard about the biopsies in 3 weeks.    SIGNATURES/CONFIDENTIALITY: You and/or your care partner have signed paperwork which will be entered into your electronic medical record.  These signatures attest to the fact that that the information above on your After Visit Summary has been reviewed and is understood.  Full responsibility of the confidentiality of this discharge information lies with you and/or your care-partner. 

## 2017-08-27 NOTE — Op Note (Signed)
[facility] Patient Name: Alisha Espinoza Procedure Date: 08/27/2017 9:52 AM MRN: [id number] Endoscopist: Mauri Pole , MD Age: 52 Referring MD:  Date of Birth: 25-Jun-1965 Gender: Female Account #: [id number] Procedure:                Colonoscopy Indications:              Screening for colorectal malignant neoplasm Medicines:                Monitored Anesthesia Care Procedure:                Pre-Anesthesia Assessment:                           - Prior to the procedure, a History and Physical                            was performed, and patient medications and                            allergies were reviewed. The patient's tolerance of                            previous anesthesia was also reviewed. The risks                            and benefits of the procedure and the sedation                            options and risks were discussed with the patient.                            All questions were answered, and informed consent                            was obtained. Prior Anticoagulants: The patient has                            taken no previous anticoagulant or antiplatelet                            agents. ASA Grade Assessment: II - A patient with                            mild systemic disease. After reviewing the risks                            and benefits, the patient was deemed in                            satisfactory condition to undergo the procedure.                           After obtaining informed consent, the colonoscope  was passed under direct vision. Throughout the                            procedure, the patient's blood pressure, pulse, and                            oxygen saturations were monitored continuously. The                            Model PCF-[id number]) scope was introduced                            through the anus and advanced to the the cecum,                            identified  by appendiceal orifice and ileocecal                            valve. The colonoscopy was performed without                            difficulty. The patient tolerated the procedure                            well. The quality of the bowel preparation was                            good. The ileocecal valve, appendiceal orifice, and                            rectum were photographed. Scope In: 10:00:35 AM Scope Out: 10:25:45 AM Scope Withdrawal Time: 0 hours 21 minutes 7 seconds  Total Procedure Duration: 0 hours 25 minutes 10 seconds  Findings:                 The perianal and digital rectal examinations were                            normal.                           Four pedunculated polyps were found in the rectum,                            recto-sigmoid colon, sigmoid colon and transverse                            colon. The polyps were 8 to 18 mm in size. These                            polyps were removed with a hot snare. Resection and                            retrieval were complete.  Two sessile polyps were found in the sigmoid colon                            and ascending colon. The polyps were 4 to 7 mm in                            size. These polyps were removed with a cold snare.                            Resection and retrieval were complete.                           Non-bleeding internal hemorrhoids were found during                            retroflexion. The hemorrhoids were small.                           The exam was otherwise without abnormality. Complications:            No immediate complications. Estimated Blood Loss:     Estimated blood loss was minimal. Impression:               - Four 8 to 18 mm polyps in the rectum, at the                            recto-sigmoid colon, in the sigmoid colon and in                            the transverse colon, removed with a hot snare.                            Resected and  retrieved.                           - Two 4 to 7 mm polyps in the sigmoid colon and in                            the ascending colon, removed with a cold snare.                            Resected and retrieved.                           - Non-bleeding internal hemorrhoids.                           - The examination was otherwise normal. Recommendation:           - Patient has a contact number available for                            emergencies. The signs and symptoms of potential  delayed complications were discussed with the                            patient. Return to normal activities tomorrow.                            Written discharge instructions were provided to the                            patient.                           - Resume previous diet.                           - Continue present medications.                           - Await pathology results.                           - Repeat colonoscopy in 1 year for surveillance                            based on pathology results. Mauri Pole, MD 08/27/2017 10:29:46 AM This report has been signed electronically.

## 2017-08-27 NOTE — Progress Notes (Signed)
Called to room to assist during endoscopic procedure.  Patient ID and intended procedure confirmed with present staff. Received instructions for my participation in the procedure from the performing physician.  

## 2017-08-28 ENCOUNTER — Telehealth: Payer: Self-pay

## 2017-08-28 ENCOUNTER — Telehealth: Payer: Self-pay | Admitting: *Deleted

## 2017-08-28 NOTE — Telephone Encounter (Signed)
No answer. No identifier. Message left to call if questions or concerns. 

## 2017-08-28 NOTE — Telephone Encounter (Signed)
  Follow up Call-  Call back number 08/27/2017  Post procedure Call Back phone  # [phone]  Permission to leave phone message Yes  Some recent data might be hidden     Left message

## 2017-09-01 ENCOUNTER — Encounter (HOSPITAL_BASED_OUTPATIENT_CLINIC_OR_DEPARTMENT_OTHER): Payer: Self-pay | Admitting: *Deleted

## 2017-09-01 ENCOUNTER — Other Ambulatory Visit: Payer: Self-pay

## 2017-09-01 ENCOUNTER — Emergency Department (HOSPITAL_BASED_OUTPATIENT_CLINIC_OR_DEPARTMENT_OTHER)
Admission: EM | Admit: 2017-09-01 | Discharge: 2017-09-01 | Disposition: A | Discharge status: Home / Self Care | Payer: BLUE CROSS/BLUE SHIELD | Attending: Emergency Medicine | Admitting: Emergency Medicine

## 2017-09-01 DIAGNOSIS — F17210 Nicotine dependence, cigarettes, uncomplicated: Secondary | ICD-10-CM | POA: Diagnosis not present

## 2017-09-01 DIAGNOSIS — Z79899 Other long term (current) drug therapy: Secondary | ICD-10-CM | POA: Diagnosis not present

## 2017-09-01 DIAGNOSIS — K0889 Other specified disorders of teeth and supporting structures: Secondary | ICD-10-CM | POA: Diagnosis not present

## 2017-09-01 MED ORDER — KETOROLAC TROMETHAMINE 30 MG/ML IJ SOLN
30.0000 mg | Freq: Once | INTRAMUSCULAR | Status: AC
  Administered 2017-09-01: 30 mg via INTRAMUSCULAR
  Filled 2017-09-01: qty 1

## 2017-09-01 MED ORDER — HYDROCODONE-ACETAMINOPHEN 5-325 MG PO TABS
1.0000 | ORAL_TABLET | Freq: Once | ORAL | Status: AC
  Administered 2017-09-01: 1 via ORAL
  Filled 2017-09-01: qty 1

## 2017-09-01 MED ORDER — NAPROXEN 500 MG PO TABS: 500.0000 mg | ORAL_TABLET | Freq: Two times a day (BID) | ORAL | 0 refills | Status: DC

## 2017-09-01 NOTE — Discharge Instructions (Addendum)
Follow-up with your dentist as scheduled for dental extraction.  Continue antibiotics.  If you develop difficulty swallowing, facial swelling, any new or worsening symptoms you should be reevaluated.

## 2017-09-01 NOTE — ED Provider Notes (Signed)
[facility] EMERGENCY DEPARTMENT Provider Note   CSN: [id number] Arrival date & time: 09/01/17  0429     History   Chief Complaint Chief Complaint  Patient presents with  . Dental Pain    HPI Alisha Espinoza is a 52 y.o. female.  HPI  This is a 52 year old female who presents with dental pain.  She was seen and evaluated for the same last week.  At that time she was started on penicillin and was given a short course of Norco.  She states that she had done well with Norco.  She saw her dentist and is scheduled for tooth extraction on Thursday.  She continues to take her antibiotics.  Patient reports she had worsening of pain tonight.  She ran out of pain medication.  She rates her pain at 10 out of 10.  She denies any difficulty swallowing or facial swelling.  She states she took ibuprofen at home with minimal relief.  Past Medical History:  Diagnosis Date  . Bronchitis   . Heart murmur    resolved "closed by age 42."   . Seasonal allergies     Patient Active Problem List   Diagnosis Date Noted  . Acute bronchitis 08/08/2015  . Leukocytosis 08/08/2015  . Hyperglycemia 08/08/2015  . Tobacco abuse 08/08/2015  . 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 BACTERIAL PNEUMONIA, RIGHT LOWER LOBE 04/27/2008  . LUMP OR MASS IN BREAST 04/27/2008  . COUGH 04/27/2008    Past Surgical History:  Procedure Laterality Date  . ABDOMINAL HYSTERECTOMY  2000  . 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 ANTERIOR CERVICAL DECOMP/DISCECTOMY FUSION N/A 05/07/2012   Procedure: ANTERIOR CERVICAL DECOMPRESSION/DISCECTOMY FUSION 1 LEVEL/HARDWARE REMOVAL;  Surgeon: Melina Schools, MD;  Location: [facility];  Service: Orthopedics;  Laterality: N/A;  REMOVAL OF CERVICAL DISC REPLACEMENT  AND ACDF C4-5  . CERVICAL FUSION  05/07/2012   Dr Rolena Infante  . CYSTECTOMY     L wrist  . ROTATOR CUFF REPAIR     Right  . TYMPANOSTOMY TUBE PLACEMENT       OB History   None      Home Medications    Prior to Admission medications   Medication Sig Start Date End Date Taking? Authorizing Provider  albuterol (PROVENTIL HFA;VENTOLIN HFA) 108 (90 Base) MCG/ACT inhaler Inhale 1-2 puffs into the lungs every 4 (four) hours as needed for wheezing or shortness of breath (or coughing). 3/55/73   Delora Fuel, MD  capsaicin (ZOSTRIX) 2.202 % cream 1 APPLICATION TOPICALLY TO AFFECTED AREA 3 TIMES PER DAY AS NEEDED FOR PAIN 06/24/17   [provider]  fluticasone (FLONASE) 50 MCG/ACT nasal spray SPRAY 2 SPRAYS INTO EACH NOSTRIL EVERY DAY(NON-PARTICIPATING PHARMACY) 06/28/17   [provider]  HYDROcodone-acetaminophen (NORCO/VICODIN) 5-325 MG tablet Take 1-2 tablets by mouth every 6 (six) hours as needed. Patient not taking: Reported on 08/27/2017 08/14/17   Veryl Speak, MD  ibuprofen (ADVIL,MOTRIN) 600 MG tablet Take 1 tablet (600 mg total) by mouth every 6 (six) hours as needed. 08/14/17   Veryl Speak, MD  loratadine (CLARITIN) 10 MG tablet Take 10 mg by mouth daily.    [provider]  naproxen (NAPROSYN) 500 MG tablet Take 1 tablet (500 mg total) by mouth 2 (two) times daily. 09/01/17   Biff Rutigliano, Barbette Hair,  MD  penicillin v potassium (VEETID) 500 MG tablet Take 1 tablet (500 mg total) by mouth 3 (three) times daily. 08/14/17   Veryl Speak, MD    Family History Family History  Problem Relation Age of Onset  . Diabetes Mother   . Hypertension Father   . Colon cancer Neg Hx   . Rectal cancer Neg Hx   . Stomach cancer Neg Hx     Social History Social History   Tobacco Use  . Smoking status: Current Every Day Smoker    Packs/day: 0.50    Years: 30.00    Pack years: 15.00    Types: Cigarettes  . Smokeless tobacco: Never Used  Substance Use Topics  . Alcohol  use: Yes    Comment: occasional  . Drug use: No     Allergies   Meloxicam   Review of Systems Review of Systems  Constitutional: Negative for fever.  HENT: Positive for dental problem. Negative for trouble swallowing.   All other systems reviewed and are negative.    Physical Exam Updated Vital Signs BP (!) 130/102 (BP Location: Right Arm)   Pulse 72   Temp 98.4 F (36.9 C) (Oral)   Resp 18   Ht 5' 6.5" (1.689 m)   Wt 89.8 kg (198 lb)   SpO2 99%   BMI 31.48 kg/m   Physical Exam  Constitutional: She is oriented to person, place, and time. She appears well-developed and well-nourished.  HENT:  Head: Normocephalic and atraumatic.  Poor dentition, tenderness palpation right lower jawline, no palpable abscess, no fullness noted under the tongue  Eyes: Pupils are equal, round, and reactive to light.  Neck: Normal range of motion. Neck supple.  Cardiovascular: Normal rate and regular rhythm.  Pulmonary/Chest: Effort normal. No respiratory distress.  Neurological: She is alert and oriented to person, place, and time.  Skin: Skin is warm and dry.  Psychiatric: She has a normal mood and affect.  Nursing note and vitals reviewed.    ED Treatments / Results  Labs (all labs ordered are listed, but only abnormal results are displayed) Labs Reviewed - No data to display  EKG None  Radiology No results found.  Procedures Procedures (including critical care time)  Medications Ordered in ED Medications  ketorolac (TORADOL) 30 MG/ML injection 30 mg (has no administration in time range)  HYDROcodone-acetaminophen (NORCO/VICODIN) 5-325 MG per tablet 1 tablet (has no administration in time range)     Initial Impression / Assessment and Plan / ED Course  I have reviewed the triage vital signs and the nursing notes.  Pertinent labs & imaging results that were available during my care of the patient were reviewed by me and considered in my medical decision making (see  chart for details).     Patient presents with dental pain.  Currently on antibiotics.  Ran out of Norco at home.  She is overall nontoxic-appearing.  No evidence of deep space infection or Ludwig Angina.  No evidence of dental abscess.  She has appropriate follow-up.  She was given Toradol and Norco in the ED.  Recommend scheduled naproxen at home until dental procedure.  Continue antibiotics.  After history, exam, and medical workup I feel the patient has been appropriately medically screened and is safe for discharge home. Pertinent diagnoses were discussed with the patient. Patient was given return precautions.   Final Clinical Impressions(s) / ED Diagnoses   Final diagnoses:  Pain, dental    ED Discharge Orders  Ordered    naproxen (NAPROSYN) 500 MG tablet  2 times daily     09/01/17 0528       Shereen Marton, Barbette Hair, MD 09/01/17 [phone]

## 2017-09-01 NOTE — ED Notes (Signed)
C/o R lower dental pain, ongoing for > 1 month, no relief with anbesol, advil, salt, or H2O2. Also R ear pain. Restarted an Rx for PenVK 500mg  after colonoscopy last Wednesday. Have taken TID for the last 5 days. Reports 3 teeth that have cavities and will be extracted on Thursday. Dentist is [facility].   Alert, NAD, calm, interactive, resps e/u, speaking in clear complete sentences, no dyspnea noted, skin W&D, VSS, (denies:  sob, swelling, NVD, fever, drainage, dizziness or visual changes), EDP into room. Family at [facility].

## 2017-09-03 ENCOUNTER — Encounter: Payer: Self-pay | Admitting: Gastroenterology

## 2017-10-08 ENCOUNTER — Inpatient Hospital Stay: Admission: RE | Admit: 2017-10-08 | Payer: BLUE CROSS/BLUE SHIELD | Source: Ambulatory Visit

## 2018-01-05 DIAGNOSIS — M79604 Pain in right leg: Secondary | ICD-10-CM | POA: Diagnosis not present

## 2018-01-05 DIAGNOSIS — I739 Peripheral vascular disease, unspecified: Secondary | ICD-10-CM | POA: Diagnosis not present

## 2018-01-05 DIAGNOSIS — Z23 Encounter for immunization: Secondary | ICD-10-CM | POA: Diagnosis not present

## 2018-01-05 DIAGNOSIS — G4700 Insomnia, unspecified: Secondary | ICD-10-CM | POA: Diagnosis not present

## 2018-01-06 ENCOUNTER — Other Ambulatory Visit: Payer: Self-pay | Admitting: Family Medicine

## 2018-01-06 DIAGNOSIS — M79604 Pain in right leg: Secondary | ICD-10-CM

## 2018-01-09 ENCOUNTER — Emergency Department (HOSPITAL_COMMUNITY)
Admission: EM | Admit: 2018-01-09 | Discharge: 2018-01-09 | Disposition: A | Discharge status: Home / Self Care | Payer: BLUE CROSS/BLUE SHIELD | Attending: Emergency Medicine | Admitting: Emergency Medicine

## 2018-01-09 ENCOUNTER — Encounter (HOSPITAL_COMMUNITY): Payer: Self-pay | Admitting: Emergency Medicine

## 2018-01-09 DIAGNOSIS — H9203 Otalgia, bilateral: Secondary | ICD-10-CM | POA: Diagnosis not present

## 2018-01-09 DIAGNOSIS — Z79899 Other long term (current) drug therapy: Secondary | ICD-10-CM | POA: Insufficient documentation

## 2018-01-09 DIAGNOSIS — N3001 Acute cystitis with hematuria: Secondary | ICD-10-CM | POA: Diagnosis not present

## 2018-01-09 DIAGNOSIS — N3091 Cystitis, unspecified with hematuria: Secondary | ICD-10-CM | POA: Insufficient documentation

## 2018-01-09 DIAGNOSIS — R319 Hematuria, unspecified: Secondary | ICD-10-CM | POA: Diagnosis not present

## 2018-01-09 DIAGNOSIS — F17210 Nicotine dependence, cigarettes, uncomplicated: Secondary | ICD-10-CM | POA: Insufficient documentation

## 2018-01-09 LAB — URINALYSIS, ROUTINE W REFLEX MICROSCOPIC
Bilirubin Urine: NEGATIVE
Glucose, UA: NEGATIVE mg/dL
Ketones, ur: 5 mg/dL — AB
Leukocytes, UA: NEGATIVE
Nitrite: NEGATIVE
Protein, ur: 100 mg/dL — AB
RBC / HPF: >50 RBC/hpf — ABNORMAL HIGH (ref 0–5)
Specific Gravity, Urine: 1.019 (ref 1.005–1.030)
pH: 6 (ref 5.0–8.0)

## 2018-01-09 MED ORDER — CEPHALEXIN 500 MG PO CAPS
1000.0000 mg | ORAL_CAPSULE | Freq: Once | ORAL | Status: AC
  Administered 2018-01-09: 1000 mg via ORAL
  Filled 2018-01-09: qty 2

## 2018-01-09 MED ORDER — CEPHALEXIN 500 MG PO CAPS: 500.0000 mg | ORAL_CAPSULE | Freq: Two times a day (BID) | ORAL | 0 refills | Status: DC

## 2018-01-09 NOTE — ED Notes (Signed)
Pt complains of blood in urine that started the morning of 01/08/18. Now complains of right leg pain, and feeling of bugs in both ears when she lays down. Also complains of congestion and having sinus problems.

## 2018-01-09 NOTE — ED Provider Notes (Signed)
[facility] DEPT Provider Note: Georgena Spurling, MD, FACEP  CSN: [id number] MRN: [id number] ARRIVAL: 01/09/18 at [place]: [id number]   CHIEF COMPLAINT  Hematuria   HISTORY OF PRESENT ILLNESS  01/09/18 5:02 AM Alisha Espinoza is a 52 y.o. female who noticed gross hematuria yesterday evening.  She became alarmed and came to the ED.  She has had subsequent gross hematuria while in the ED.  She denies any dysuria, urinary urgency or frequency, back pain, flank pain, nausea, vomiting, diarrhea, fever or chills.  She does have a foreign body sensation in her ears when she lies down for the past week.    Past Medical History:  Diagnosis Date  . Bronchitis   . Heart murmur    resolved "closed by age 15."   . Seasonal allergies     Past Surgical History:  Procedure Laterality Date  . ABDOMINAL HYSTERECTOMY  2000  . 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 ANTERIOR CERVICAL DECOMP/DISCECTOMY FUSION N/A 05/07/2012   Procedure: ANTERIOR CERVICAL DECOMPRESSION/DISCECTOMY FUSION 1 LEVEL/HARDWARE REMOVAL;  Surgeon: Melina Schools, MD;  Location: [facility];  Service: Orthopedics;  Laterality: N/A;  REMOVAL OF CERVICAL DISC REPLACEMENT AND ACDF C4-5  . CERVICAL FUSION  05/07/2012   Dr Rolena Infante  . CYSTECTOMY     L wrist  . ROTATOR CUFF REPAIR     Right  . TYMPANOSTOMY TUBE PLACEMENT      Family History  Problem Relation Age of Onset  . Diabetes Mother   . Hypertension Father   . Colon cancer Neg Hx   . Rectal cancer Neg Hx   . Stomach cancer Neg Hx     Social History   Tobacco Use  . Smoking status: Current Every Day Smoker    Packs/day: 0.50    Years: 30.00    Pack years: 15.00    Types: Cigarettes  . Smokeless tobacco: Never Used  Substance Use Topics  . Alcohol use: Yes   Comment: occasional  . Drug use: No    Prior to Admission medications   Medication Sig Start Date End Date Taking? Authorizing Provider  albuterol (PROVENTIL HFA;VENTOLIN HFA) 108 (90 Base) MCG/ACT inhaler Inhale 1-2 puffs into the lungs every 4 (four) hours as needed for wheezing or shortness of breath (or coughing). 2/45/80   Delora Fuel, MD  capsaicin (ZOSTRIX) 9.983 % cream 1 APPLICATION TOPICALLY TO AFFECTED AREA 3 TIMES PER DAY AS NEEDED FOR PAIN 06/24/17   [provider]  fluticasone (FLONASE) 50 MCG/ACT nasal spray SPRAY 2 SPRAYS INTO EACH NOSTRIL EVERY DAY(NON-PARTICIPATING PHARMACY) 06/28/17   [provider]  HYDROcodone-acetaminophen (NORCO/VICODIN) 5-325 MG tablet Take 1-2 tablets by mouth every 6 (six) hours as needed. Patient not taking: Reported on 08/27/2017 08/14/17   Veryl Speak, MD  ibuprofen (ADVIL,MOTRIN) 600 MG tablet Take 1 tablet (600 mg total) by mouth every 6 (six) hours as needed. 08/14/17   Veryl Speak, MD  loratadine (CLARITIN) 10 MG tablet Take 10 mg by mouth daily.    [provider]  naproxen (NAPROSYN) 500 MG tablet Take 1 tablet (500 mg total) by mouth 2 (two) times daily. 09/01/17   Horton, Barbette Hair, MD  penicillin v potassium (VEETID) 500 MG tablet Take 1 tablet (500 mg total) by mouth 3 (three) times daily. 08/14/17   Veryl Speak,  MD    Allergies Meloxicam   REVIEW OF SYSTEMS  Negative except as noted here or in the History of Present Illness.   PHYSICAL EXAMINATION  Initial Vital Signs Blood pressure 127/85, pulse 93, resp. rate 17, SpO2 98 %.  Examination General: Well-developed, well-nourished female in no acute distress; appearance consistent with age of record HENT: normocephalic; atraumatic; TMs normal; no cerumen or foreign object seen in external auditory canals Eyes: pupils equal, round and reactive to light; extraocular muscles intact Neck: supple Heart: regular rate and rhythm Lungs: clear to auscultation  bilaterally Abdomen: soft; nondistended; nontender; bowel sounds present GU: No CVA tenderness Extremities: No deformity; full range of motion; pulses normal Neurologic: Awake, alert and oriented; motor function intact in all extremities and symmetric; no facial droop Skin: Warm and dry Psychiatric: Normal mood and affect   RESULTS  Summary of this visit's results, reviewed by myself:   EKG Interpretation  Date/Time:    Ventricular Rate:    PR Interval:    QRS Duration:   QT Interval:    QTC Calculation:   R Axis:     Text Interpretation:        Laboratory Studies: Results for orders placed or performed during the hospital encounter of 01/09/18 (from the past 24 hour(s))  Urinalysis, Routine w reflex microscopic- may I&O cath if menses     Status: Abnormal   Collection Time: 01/09/18  1:12 AM  Result Value Ref Range   Color, Urine RED (A) YELLOW   APPearance CLOUDY (A) CLEAR   Specific Gravity, Urine 1.019 1.005 - 1.030   pH 6.0 5.0 - 8.0   Glucose, UA NEGATIVE NEGATIVE mg/dL   Hgb urine dipstick LARGE (A) NEGATIVE   Bilirubin Urine NEGATIVE NEGATIVE   Ketones, ur 5 (A) NEGATIVE mg/dL   Protein, ur 100 (A) NEGATIVE mg/dL   Nitrite NEGATIVE NEGATIVE   Leukocytes, UA NEGATIVE NEGATIVE   RBC / HPF >50 (H) 0 - 5 RBC/hpf   WBC, UA 21-50 0 - 5 WBC/hpf   Bacteria, UA RARE (A) NONE SEEN   Squamous Epithelial / LPF 6-10 0 - 5   Mucus PRESENT    Budding Yeast PRESENT    Imaging Studies: No results found.  ED COURSE and MDM  Nursing notes and initial vitals signs, including pulse oximetry, reviewed.  Vitals:   01/09/18 0350 01/09/18 0352 01/09/18 0353  BP: 127/85  127/85  Pulse:  77 93  Resp:  17 17  SpO2:  99% 98%   Urinalysis consistent with hemorrhagic cystitis.  She has a follow-up appointment with her PCP in 4 days.  PROCEDURES    ED DIAGNOSES     ICD-10-CM   1. Hemorrhagic cystitis N30.91   2. Ear discomfort, bilateral H92.03        Srihitha Tagliaferri, Jenny Reichmann,  MD 01/09/18 [phone]

## 2018-01-09 NOTE — ED Triage Notes (Signed)
Pt from home with c/o hematuria that began today. Pt denies pain or pressure with urination. Pt also has c/o feeling of foreign body in bilateral ears. Pt states this has been ongoing x 1 week. No foreign body, swelling, or drainage visualized in either ear. No fever or chills.

## 2018-01-10 LAB — URINE CULTURE: Culture: NO GROWTH

## 2018-01-13 ENCOUNTER — Ambulatory Visit
Admission: RE | Admit: 2018-01-13 | Discharge: 2018-01-13 | Disposition: A | Discharge status: Home / Self Care | Payer: BLUE CROSS/BLUE SHIELD | Source: Ambulatory Visit | Attending: Family Medicine | Admitting: Family Medicine

## 2018-01-13 DIAGNOSIS — I70211 Atherosclerosis of native arteries of extremities with intermittent claudication, right leg: Secondary | ICD-10-CM | POA: Diagnosis not present

## 2018-01-13 DIAGNOSIS — M79604 Pain in right leg: Secondary | ICD-10-CM

## 2018-01-14 ENCOUNTER — Other Ambulatory Visit: Payer: Self-pay | Admitting: Family Medicine

## 2018-01-14 ENCOUNTER — Ambulatory Visit
Admission: RE | Admit: 2018-01-14 | Discharge: 2018-01-14 | Disposition: A | Discharge status: Home / Self Care | Payer: BLUE CROSS/BLUE SHIELD | Source: Ambulatory Visit | Attending: Family Medicine | Admitting: Family Medicine

## 2018-01-14 DIAGNOSIS — M5430 Sciatica, unspecified side: Secondary | ICD-10-CM

## 2018-01-14 DIAGNOSIS — R319 Hematuria, unspecified: Secondary | ICD-10-CM | POA: Diagnosis not present

## 2018-01-14 DIAGNOSIS — I739 Peripheral vascular disease, unspecified: Secondary | ICD-10-CM | POA: Diagnosis not present

## 2018-01-14 DIAGNOSIS — G4700 Insomnia, unspecified: Secondary | ICD-10-CM | POA: Diagnosis not present

## 2018-01-14 DIAGNOSIS — M47816 Spondylosis without myelopathy or radiculopathy, lumbar region: Secondary | ICD-10-CM | POA: Diagnosis not present

## 2018-01-14 DIAGNOSIS — M79604 Pain in right leg: Secondary | ICD-10-CM | POA: Diagnosis not present

## 2018-01-19 ENCOUNTER — Other Ambulatory Visit: Payer: Self-pay | Admitting: Family Medicine

## 2018-01-19 ENCOUNTER — Other Ambulatory Visit: Payer: BLUE CROSS/BLUE SHIELD

## 2018-01-19 DIAGNOSIS — M5431 Sciatica, right side: Secondary | ICD-10-CM

## 2018-01-19 DIAGNOSIS — M79604 Pain in right leg: Secondary | ICD-10-CM

## 2018-01-19 DIAGNOSIS — M79605 Pain in left leg: Principal | ICD-10-CM

## 2018-01-19 DIAGNOSIS — M5432 Sciatica, left side: Secondary | ICD-10-CM

## 2018-01-20 DIAGNOSIS — M5431 Sciatica, right side: Secondary | ICD-10-CM | POA: Diagnosis not present

## 2018-01-20 DIAGNOSIS — M479 Spondylosis, unspecified: Secondary | ICD-10-CM | POA: Diagnosis not present

## 2018-01-20 DIAGNOSIS — M79604 Pain in right leg: Secondary | ICD-10-CM | POA: Diagnosis not present

## 2018-01-20 DIAGNOSIS — Z6832 Body mass index (BMI) 32.0-32.9, adult: Secondary | ICD-10-CM | POA: Diagnosis not present

## 2018-01-25 ENCOUNTER — Ambulatory Visit
Admission: RE | Admit: 2018-01-25 | Discharge: 2018-01-25 | Disposition: A | Discharge status: Home / Self Care | Payer: BLUE CROSS/BLUE SHIELD | Source: Ambulatory Visit | Attending: Family Medicine | Admitting: Family Medicine

## 2018-01-25 DIAGNOSIS — M5127 Other intervertebral disc displacement, lumbosacral region: Secondary | ICD-10-CM | POA: Diagnosis not present

## 2018-01-25 DIAGNOSIS — M79605 Pain in left leg: Principal | ICD-10-CM

## 2018-01-25 DIAGNOSIS — M5432 Sciatica, left side: Secondary | ICD-10-CM

## 2018-01-25 DIAGNOSIS — M79604 Pain in right leg: Secondary | ICD-10-CM

## 2018-01-25 DIAGNOSIS — M5431 Sciatica, right side: Secondary | ICD-10-CM

## 2018-01-25 DIAGNOSIS — M5126 Other intervertebral disc displacement, lumbar region: Secondary | ICD-10-CM | POA: Diagnosis not present

## 2018-01-25 DIAGNOSIS — M47816 Spondylosis without myelopathy or radiculopathy, lumbar region: Secondary | ICD-10-CM | POA: Diagnosis not present

## 2018-01-26 DIAGNOSIS — M5431 Sciatica, right side: Secondary | ICD-10-CM | POA: Diagnosis not present

## 2018-01-26 DIAGNOSIS — M79604 Pain in right leg: Secondary | ICD-10-CM | POA: Diagnosis not present

## 2018-01-26 DIAGNOSIS — M479 Spondylosis, unspecified: Secondary | ICD-10-CM | POA: Diagnosis not present

## 2018-01-26 DIAGNOSIS — E782 Mixed hyperlipidemia: Secondary | ICD-10-CM | POA: Diagnosis not present

## 2018-02-05 DIAGNOSIS — M79604 Pain in right leg: Secondary | ICD-10-CM | POA: Diagnosis not present

## 2018-02-05 DIAGNOSIS — G4700 Insomnia, unspecified: Secondary | ICD-10-CM | POA: Diagnosis not present

## 2018-02-05 DIAGNOSIS — M5431 Sciatica, right side: Secondary | ICD-10-CM | POA: Diagnosis not present

## 2018-02-05 DIAGNOSIS — Z6833 Body mass index (BMI) 33.0-33.9, adult: Secondary | ICD-10-CM | POA: Diagnosis not present

## 2018-02-10 DIAGNOSIS — M5106 Intervertebral disc disorders with myelopathy, lumbar region: Secondary | ICD-10-CM | POA: Diagnosis not present

## 2018-02-11 ENCOUNTER — Other Ambulatory Visit: Payer: Self-pay | Admitting: Family Medicine

## 2018-02-11 ENCOUNTER — Ambulatory Visit
Admission: RE | Admit: 2018-02-11 | Discharge: 2018-02-11 | Disposition: A | Discharge status: Home / Self Care | Payer: BLUE CROSS/BLUE SHIELD | Source: Ambulatory Visit | Attending: Family Medicine | Admitting: Family Medicine

## 2018-02-11 DIAGNOSIS — R928 Other abnormal and inconclusive findings on diagnostic imaging of breast: Secondary | ICD-10-CM

## 2018-02-11 DIAGNOSIS — N6310 Unspecified lump in the right breast, unspecified quadrant: Secondary | ICD-10-CM

## 2018-02-11 DIAGNOSIS — N6312 Unspecified lump in the right breast, upper inner quadrant: Secondary | ICD-10-CM | POA: Diagnosis not present

## 2018-02-16 DIAGNOSIS — R030 Elevated blood-pressure reading, without diagnosis of hypertension: Secondary | ICD-10-CM | POA: Diagnosis not present

## 2018-02-16 DIAGNOSIS — M5126 Other intervertebral disc displacement, lumbar region: Secondary | ICD-10-CM | POA: Diagnosis not present

## 2018-02-16 DIAGNOSIS — M5416 Radiculopathy, lumbar region: Secondary | ICD-10-CM | POA: Diagnosis not present

## 2018-02-16 DIAGNOSIS — Z6833 Body mass index (BMI) 33.0-33.9, adult: Secondary | ICD-10-CM | POA: Diagnosis not present

## 2018-03-13 DIAGNOSIS — M48062 Spinal stenosis, lumbar region with neurogenic claudication: Secondary | ICD-10-CM | POA: Diagnosis not present

## 2018-03-13 DIAGNOSIS — M5126 Other intervertebral disc displacement, lumbar region: Secondary | ICD-10-CM | POA: Diagnosis not present

## 2018-03-22 ENCOUNTER — Emergency Department (HOSPITAL_COMMUNITY)
Admission: EM | Admit: 2018-03-22 | Discharge: 2018-03-22 | Disposition: A | Discharge status: Home / Self Care | Payer: BLUE CROSS/BLUE SHIELD | Attending: Emergency Medicine | Admitting: Emergency Medicine

## 2018-03-22 ENCOUNTER — Encounter (HOSPITAL_COMMUNITY): Payer: Self-pay | Admitting: Emergency Medicine

## 2018-03-22 ENCOUNTER — Emergency Department (HOSPITAL_COMMUNITY): Payer: BLUE CROSS/BLUE SHIELD

## 2018-03-22 DIAGNOSIS — Z79899 Other long term (current) drug therapy: Secondary | ICD-10-CM | POA: Diagnosis not present

## 2018-03-22 DIAGNOSIS — F17210 Nicotine dependence, cigarettes, uncomplicated: Secondary | ICD-10-CM | POA: Diagnosis not present

## 2018-03-22 DIAGNOSIS — M5416 Radiculopathy, lumbar region: Secondary | ICD-10-CM | POA: Diagnosis not present

## 2018-03-22 DIAGNOSIS — G8918 Other acute postprocedural pain: Secondary | ICD-10-CM

## 2018-03-22 DIAGNOSIS — M47816 Spondylosis without myelopathy or radiculopathy, lumbar region: Secondary | ICD-10-CM | POA: Diagnosis not present

## 2018-03-22 DIAGNOSIS — M545 Low back pain: Secondary | ICD-10-CM | POA: Diagnosis not present

## 2018-03-22 LAB — CBC WITH DIFFERENTIAL/PLATELET
Abs Immature Granulocytes: 0.05 10*3/uL (ref 0.00–0.07)
Basophils Absolute: 0.1 10*3/uL (ref 0.0–0.1)
Basophils Relative: 1 %
Eosinophils Absolute: 0.3 10*3/uL (ref 0.0–0.5)
Eosinophils Relative: 3 %
HCT: 46.2 % — ABNORMAL HIGH (ref 36.0–46.0)
Hemoglobin: 14.9 g/dL (ref 12.0–15.0)
Immature Granulocytes: 1 %
Lymphocytes Relative: 27 %
Lymphs Abs: 2.3 10*3/uL (ref 0.7–4.0)
MCH: 27.1 pg (ref 26.0–34.0)
MCHC: 32.3 g/dL (ref 30.0–36.0)
MCV: 84 fL (ref 80.0–100.0)
Monocytes Absolute: 0.7 10*3/uL (ref 0.1–1.0)
Monocytes Relative: 8 %
Neutro Abs: 5.2 10*3/uL (ref 1.7–7.7)
Neutrophils Relative %: 60 %
Platelets: 302 10*3/uL (ref 150–400)
RBC: 5.5 MIL/uL — ABNORMAL HIGH (ref 3.87–5.11)
RDW: 14.6 % (ref 11.5–15.5)
WBC: 8.5 10*3/uL (ref 4.0–10.5)
nRBC: 0 % (ref 0.0–0.2)

## 2018-03-22 LAB — C-REACTIVE PROTEIN: CRP: 1.8 mg/dL — ABNORMAL HIGH (ref <1.0)

## 2018-03-22 LAB — BASIC METABOLIC PANEL
Anion gap: 9 (ref 5–15)
BUN: 12 mg/dL (ref 6–20)
CO2: 25 mmol/L (ref 22–32)
Calcium: 9.8 mg/dL (ref 8.9–10.3)
Chloride: 104 mmol/L (ref 98–111)
Creatinine, Ser: 0.7 mg/dL (ref 0.44–1.00)
GFR calc Af Amer: >60 mL/min (ref >60)
GFR calc non Af Amer: >60 mL/min (ref >60)
Glucose, Bld: 85 mg/dL (ref 70–99)
Potassium: 4 mmol/L (ref 3.5–5.1)
Sodium: 138 mmol/L (ref 135–145)

## 2018-03-22 LAB — SEDIMENTATION RATE: Sed Rate: 32 mm/hr — ABNORMAL HIGH (ref 0–22)

## 2018-03-22 MED ORDER — OXYCODONE HCL 5 MG PO TABS: 5.0000 mg | ORAL_TABLET | ORAL | 0 refills | Status: DC | PRN

## 2018-03-22 MED ORDER — HYDROMORPHONE HCL 1 MG/ML IJ SOLN
1.0000 mg | Freq: Once | INTRAMUSCULAR | Status: AC
  Administered 2018-03-22: 1 mg via INTRAVENOUS
  Filled 2018-03-22: qty 1

## 2018-03-22 MED ORDER — PREDNISONE 10 MG (21) PO TBPK: ORAL_TABLET | ORAL | 0 refills | Status: DC

## 2018-03-22 MED ORDER — DEXAMETHASONE SODIUM PHOSPHATE 10 MG/ML IJ SOLN
10.0000 mg | Freq: Once | INTRAMUSCULAR | Status: AC
  Administered 2018-03-22: 10 mg via INTRAVENOUS
  Filled 2018-03-22: qty 1

## 2018-03-22 MED ORDER — TIZANIDINE HCL 2 MG PO TABS: 4.0000 mg | ORAL_TABLET | Freq: Three times a day (TID) | ORAL | 0 refills | Status: DC | PRN

## 2018-03-22 NOTE — ED Provider Notes (Addendum)
[facility] DEPT Provider Note   CSN: [id number] Arrival date & time: 03/22/18  1149     History   Chief Complaint Chief Complaint  Patient presents with  . Back Pain  . Post-op Problem    HPI Alisha Espinoza is a 52 y.o. female.  HPI 52 yo F with h/o lower back pain here with worsening R hip and back pain. Pt just had lumbar discectomy, suspected L3-L4, one week ago. She felt better initially for 3-4 days after surgery but has since had recurrence of significant R paraspinal back pain along with shooting, sharp, burning pain along her R buttocks and thigh. The pain does not extend down her leg past knee, which is improved. Pain worse with movement, palpation. She's run out of her pain medications. No fever, chills, or drainage or redness around her wound. No other complaints. Has been taking Zanaflex which does help her pain and spasms. No falls. No difficulty urinating or loss of bowel/bladder function.   Past Medical History:  Diagnosis Date  . Bronchitis   . Heart murmur    resolved "closed by age 74."   . Seasonal allergies     Patient Active Problem List   Diagnosis Date Noted  . Acute bronchitis 08/08/2015  . Leukocytosis 08/08/2015  . Hyperglycemia 08/08/2015  . Tobacco abuse 08/08/2015  . 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 BACTERIAL PNEUMONIA, RIGHT LOWER LOBE 04/27/2008  . LUMP OR MASS IN BREAST 04/27/2008  . COUGH 04/27/2008    Past Surgical History:  Procedure Laterality Date  . ABDOMINAL HYSTERECTOMY  2000  . 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 ANTERIOR CERVICAL DECOMP/DISCECTOMY FUSION N/A 05/07/2012   Procedure: ANTERIOR CERVICAL DECOMPRESSION/DISCECTOMY FUSION 1 LEVEL/HARDWARE REMOVAL;  Surgeon:  Melina Schools, MD;  Location: [facility];  Service: Orthopedics;  Laterality: N/A;  REMOVAL OF CERVICAL DISC REPLACEMENT AND ACDF C4-5  . BACK SURGERY    . CERVICAL FUSION  05/07/2012   Dr Rolena Infante  . CYSTECTOMY     L wrist  . ROTATOR CUFF REPAIR     Right  . TYMPANOSTOMY TUBE PLACEMENT       OB History   No obstetric history on file.      Home Medications    Prior to Admission medications   Medication Sig Start Date End Date Taking? Authorizing Provider  albuterol (PROVENTIL HFA;VENTOLIN HFA) 108 (90 Base) MCG/ACT inhaler Inhale 1-2 puffs into the lungs every 4 (four) hours as needed for wheezing or shortness of breath (or coughing). 7/89/38  Yes Delora Fuel, MD  oxyCODONE-acetaminophen (PERCOCET/ROXICET) 5-325 MG tablet Take 1-2 tablets by mouth every 4 (four) hours as needed for severe pain.   Yes [provider]  simvastatin (ZOCOR) 20 MG tablet Take 20 mg by mouth daily. 01/26/18  Yes [provider]  oxyCODONE (ROXICODONE) 5 MG immediate release tablet Take 1-2 tablets (5-10 mg total) by mouth every 4 (four) hours as needed for moderate pain or severe pain. 03/22/18   Duffy Bruce, MD  predniSONE (STERAPRED UNI-PAK 21 TAB) 10 MG (21) TBPK tablet Take as directed on package 03/22/18   Duffy Bruce, MD  tiZANidine (ZANAFLEX) 2 MG tablet Take 2 tablets (4 mg total) by mouth every 8 (eight) hours as needed for muscle spasms. 03/22/18   Duffy Bruce,  MD    Family History Family History  Problem Relation Age of Onset  . Diabetes Mother   . Hypertension Father   . Colon cancer Neg Hx   . Rectal cancer Neg Hx   . Stomach cancer Neg Hx     Social History Social History   Tobacco Use  . Smoking status: Current Every Day Smoker    Packs/day: 0.50    Years: 30.00    Pack years: 15.00    Types: Cigarettes  . Smokeless tobacco: Never Used  Substance Use Topics  . Alcohol use: Yes    Comment: occasional  . Drug use: No     Allergies    Meloxicam   Review of Systems Review of Systems  Constitutional: Negative for chills, fatigue and fever.  HENT: Negative for congestion and rhinorrhea.   Eyes: Negative for visual disturbance.  Respiratory: Negative for cough, shortness of breath and wheezing.   Cardiovascular: Negative for chest pain and leg swelling.  Gastrointestinal: Negative for abdominal pain, diarrhea, nausea and vomiting.  Genitourinary: Negative for dysuria and flank pain.  Musculoskeletal: Positive for back pain. Negative for neck pain and neck stiffness.  Skin: Negative for rash and wound.  Allergic/Immunologic: Negative for immunocompromised state.  Neurological: Negative for syncope, weakness and headaches.  All other systems reviewed and are negative.    Physical Exam Updated Vital Signs BP 122/83 (BP Location: Right Arm)   Pulse 87   Temp 98.4 F (36.9 C) (Oral)   Resp 16   Ht 5\' 6"  (1.676 m)   Wt 92.5 kg   SpO2 98%   BMI 32.93 kg/m   Physical Exam Vitals signs and nursing note reviewed.  Constitutional:      General: She is not in acute distress.    Appearance: She is well-developed.  HENT:     Head: Normocephalic and atraumatic.  Eyes:     Conjunctiva/sclera: Conjunctivae normal.  Neck:     Musculoskeletal: Neck supple.  Cardiovascular:     Rate and Rhythm: Normal rate and regular rhythm.     Heart sounds: Normal heart sounds. No murmur. No friction rub.  Pulmonary:     Effort: Pulmonary effort is normal. No respiratory distress.     Breath sounds: Normal breath sounds. No wheezing or rales.  Abdominal:     General: There is no distension.     Palpations: Abdomen is soft.     Tenderness: There is no abdominal tenderness.  Skin:    General: Skin is warm.     Capillary Refill: Capillary refill takes less than 2 seconds.  Neurological:     Mental Status: She is alert and oriented to person, place, and time.     Motor: No abnormal muscle tone.     Spine  Exam: Inspection/Palpation: Midline incision site is c/d/i, no surrounding erythema, drainage, or induration. Mild surrounding TTP on right paraspinal musculature. No deformity. No edema. Strength: 5/5 throughout LE bilaterally (hip flexion/extension, adduction/abduction; knee flexion/extension; foot dorsiflexion/plantarflexion, inversion/eversion; great toe inversion) Sensation: Intact to light touch in proximal and distal LE bilaterally Reflexes: 2+ quadriceps and achilles reflexes  ED Treatments / Results  Labs (all labs ordered are listed, but only abnormal results are displayed) Labs Reviewed  CBC WITH DIFFERENTIAL/PLATELET - Abnormal; Notable for the following components:      Result Value   RBC 5.50 (*)    HCT 46.2 (*)    All other components within normal limits  SEDIMENTATION RATE - Abnormal; Notable for  the following components:   Sed Rate 32 (*)    All other components within normal limits  C-REACTIVE PROTEIN - Abnormal; Notable for the following components:   CRP 1.8 (*)    All other components within normal limits  BASIC METABOLIC PANEL    EKG None  Radiology Dg Lumbar Spine Complete  Result Date: 03/22/2018 CLINICAL DATA:  Recent back surgery 03/13/2018, persistent pain EXAM: LUMBAR SPINE - COMPLETE 4+ VIEW COMPARISON:  03/13/2018, 01/14/2018 FINDINGS: Stable alignment. No subluxation or dislocation. Advanced multilevel degenerative spondylosis, most severe at L3-4, L4-5, and to a lesser degree L5-S1. These 3 levels demonstrate marked disc space narrowing, sclerosis and endplate osteophytes. No pars defects. Normal appearing pedicles. Mild SI joint sclerosis. No significant interval change. Preserved vertebral body heights. No acute compression fracture, wedge-shaped deformity or focal kyphosis. Aortoiliac atherosclerosis present. IMPRESSION: Stable severe lumbar degenerative spondylosis from L3-S1. No significant interval change by plain radiography Aortoiliac  atherosclerosis Electronically Signed   By: Jerilynn Mages.  Shick M.D.   On: 03/22/2018 14:33    Procedures Procedures (including critical care time)  Medications Ordered in ED Medications  HYDROmorphone (DILAUDID) injection 1 mg (1 mg Intravenous Given 03/22/18 1250)  dexamethasone (DECADRON) injection 10 mg (10 mg Intravenous Given 03/22/18 1601)     Initial Impression / Assessment and Plan / ED Course  I have reviewed the triage vital signs and the nursing notes.  Pertinent labs & imaging results that were available during my care of the patient were reviewed by me and considered in my medical decision making (see chart for details).     52 yo F here w/ recurrent R back and buttocks/thigh pain s/p recent lumbar discectomy. I suspect pt has recurrent radiculopathy 2/2 post-operative swelling after intra-op steroids have worn off, given timeline. The incision appears c/d/i, WBC is normal, no fever, inflammatory markers only minimally elevated and I do not suspect wound infection at this time. She has no leg swelling or signs of DVT/PE. No evidence of cauda equina. Discussed case with Dr. Ellene Route, will start on steroid pack and provide additional analgesia and zanaflex. Pt in agreement with plan of care. Advised not to take high doses at same time to prevent oversedation, and risks of opiates discussed.  Final Clinical Impressions(s) / ED Diagnoses   Final diagnoses:  Post-operative pain  Lumbar radiculopathy    ED Discharge Orders         Ordered    predniSONE (STERAPRED UNI-PAK 21 TAB) 10 MG (21) TBPK tablet     03/22/18 1614    tiZANidine (ZANAFLEX) 2 MG tablet  Every 8 hours PRN     03/22/18 1614    oxyCODONE (ROXICODONE) 5 MG immediate release tablet  Every 4 hours PRN     03/22/18 1614           Duffy Bruce, MD 03/22/18 Rodolph Bong    Duffy Bruce, MD 03/22/18 [phone]

## 2018-03-22 NOTE — ED Triage Notes (Signed)
Pt reports had back surgery on Dec 13th and still having back pains. Denies any falls or injuries.

## 2018-04-27 DIAGNOSIS — E782 Mixed hyperlipidemia: Secondary | ICD-10-CM | POA: Diagnosis not present

## 2018-05-01 DIAGNOSIS — E782 Mixed hyperlipidemia: Secondary | ICD-10-CM | POA: Diagnosis not present

## 2018-05-01 DIAGNOSIS — M479 Spondylosis, unspecified: Secondary | ICD-10-CM | POA: Diagnosis not present

## 2018-05-01 DIAGNOSIS — M5441 Lumbago with sciatica, right side: Secondary | ICD-10-CM | POA: Diagnosis not present

## 2018-05-01 DIAGNOSIS — Z6832 Body mass index (BMI) 32.0-32.9, adult: Secondary | ICD-10-CM | POA: Diagnosis not present

## 2018-05-20 DIAGNOSIS — M5416 Radiculopathy, lumbar region: Secondary | ICD-10-CM | POA: Diagnosis not present

## 2018-05-21 DIAGNOSIS — R030 Elevated blood-pressure reading, without diagnosis of hypertension: Secondary | ICD-10-CM | POA: Diagnosis not present

## 2018-05-21 DIAGNOSIS — M5416 Radiculopathy, lumbar region: Secondary | ICD-10-CM | POA: Diagnosis not present

## 2018-05-21 DIAGNOSIS — Z6832 Body mass index (BMI) 32.0-32.9, adult: Secondary | ICD-10-CM | POA: Diagnosis not present

## 2018-07-21 ENCOUNTER — Other Ambulatory Visit: Payer: Self-pay | Admitting: Neurological Surgery

## 2018-08-04 ENCOUNTER — Other Ambulatory Visit: Payer: BLUE CROSS/BLUE SHIELD

## 2018-08-12 ENCOUNTER — Encounter: Payer: Self-pay | Admitting: Gastroenterology

## 2018-08-19 ENCOUNTER — Encounter: Payer: Self-pay | Admitting: Gastroenterology

## 2018-08-20 ENCOUNTER — Encounter: Payer: Self-pay | Admitting: Gastroenterology

## 2018-08-27 ENCOUNTER — Other Ambulatory Visit: Payer: Self-pay | Admitting: Neurological Surgery

## 2018-08-27 DIAGNOSIS — M5416 Radiculopathy, lumbar region: Secondary | ICD-10-CM | POA: Diagnosis not present

## 2018-09-04 ENCOUNTER — Ambulatory Visit: Payer: Self-pay | Admitting: *Deleted

## 2018-09-04 ENCOUNTER — Other Ambulatory Visit: Payer: Self-pay

## 2018-09-04 VITALS — Ht 66.5 in | Wt 207.0 lb

## 2018-09-04 DIAGNOSIS — Z86010 Personal history of colonic polyps: Secondary | ICD-10-CM

## 2018-09-04 MED ORDER — NA SULFATE-K SULFATE-MG SULF 17.5-3.13-1.6 GM/177ML PO SOLN: 1.0000 | Freq: Once | ORAL | 0 refills | Status: AC

## 2018-09-04 NOTE — Progress Notes (Signed)

## 2018-09-07 ENCOUNTER — Inpatient Hospital Stay: Admit: 2018-09-07 | Payer: BLUE CROSS/BLUE SHIELD | Admitting: Neurological Surgery

## 2018-09-07 SURGERY — POSTERIOR LUMBAR FUSION 3 LEVEL
Anesthesia: General | Site: Back

## 2018-09-08 DIAGNOSIS — R200 Anesthesia of skin: Secondary | ICD-10-CM | POA: Diagnosis not present

## 2018-09-17 ENCOUNTER — Telehealth: Payer: Self-pay | Admitting: Gastroenterology

## 2018-09-17 NOTE — Telephone Encounter (Signed)

## 2018-09-17 NOTE — Telephone Encounter (Signed)
All answers no

## 2018-09-18 ENCOUNTER — Encounter: Payer: Self-pay | Admitting: Gastroenterology

## 2018-09-18 ENCOUNTER — Encounter: Payer: BLUE CROSS/BLUE SHIELD | Admitting: Gastroenterology

## 2018-09-18 ENCOUNTER — Ambulatory Visit (AMBULATORY_SURGERY_CENTER): Payer: BC Managed Care – PPO | Admitting: Gastroenterology

## 2018-09-18 ENCOUNTER — Other Ambulatory Visit: Payer: Self-pay

## 2018-09-18 VITALS — BP 131/89 | HR 72 | Temp 98.8°F | Resp 20 | Ht 66.5 in | Wt 207.0 lb

## 2018-09-18 DIAGNOSIS — D128 Benign neoplasm of rectum: Secondary | ICD-10-CM

## 2018-09-18 DIAGNOSIS — Z86010 Personal history of colonic polyps: Secondary | ICD-10-CM

## 2018-09-18 DIAGNOSIS — D122 Benign neoplasm of ascending colon: Secondary | ICD-10-CM

## 2018-09-18 DIAGNOSIS — K621 Rectal polyp: Secondary | ICD-10-CM

## 2018-09-18 DIAGNOSIS — Z1211 Encounter for screening for malignant neoplasm of colon: Secondary | ICD-10-CM | POA: Diagnosis not present

## 2018-09-18 MED ORDER — SODIUM CHLORIDE 0.9 % IV SOLN: 500.0000 mL | Freq: Once | INTRAVENOUS | Status: DC

## 2018-09-18 NOTE — Patient Instructions (Signed)
YOU HAD AN ENDOSCOPIC PROCEDURE TODAY AT [facility]:   Refer to the procedure report that was given to you for any specific questions about what was found during the examination.  If the procedure report does not answer your questions, please call your gastroenterologist to clarify.  If you requested that your care partner not be given the details of your procedure findings, then the procedure report has been included in a sealed envelope for you to review at your convenience later.  YOU SHOULD EXPECT: Some feelings of bloating in the abdomen. Passage of more gas than usual.  Walking can help get rid of the air that was put into your GI tract during the procedure and reduce the bloating. If you had a lower endoscopy (such as a colonoscopy or flexible sigmoidoscopy) you may notice spotting of blood in your stool or on the toilet paper. If you underwent a bowel prep for your procedure, you may not have a normal bowel movement for a few days.  Please Note:  You might notice some irritation and congestion in your nose or some drainage.  This is from the oxygen used during your procedure.  There is no need for concern and it should clear up in a day or so.  SYMPTOMS TO REPORT IMMEDIATELY:   Following lower endoscopy (colonoscopy or flexible sigmoidoscopy):  Excessive amounts of blood in the stool  Significant tenderness or worsening of abdominal pains  Swelling of the abdomen that is new, acute  Fever of 100F or higher  For urgent or emergent issues, a gastroenterologist can be reached at any hour by calling [phone].   DIET:  We do recommend a small meal at first, but then you may proceed to your regular diet.  Drink plenty of fluids but you should avoid alcoholic beverages for 24 hours.  ACTIVITY:  You should plan to take it easy for the rest of today and you should NOT DRIVE or use heavy machinery until tomorrow (because of the sedation medicines used during the test).     FOLLOW UP: Our staff will call the number listed on your records 48-72 hours following your procedure to check on you and address any questions or concerns that you may have regarding the information given to you following your procedure. If we do not reach you, we will leave a message.  We will attempt to reach you two times.  During this call, we will ask if you have developed any symptoms of COVID 19. If you develop any symptoms (ie: fever, flu-like symptoms, shortness of breath, cough etc.) before then, please call [phone].  If you test positive for Covid 19 in the 2 weeks post procedure, please call and report this information to [place].    If any biopsies were taken you will be contacted by phone or by letter within the next 1-3 weeks.  Please call us at [phone] if you have not heard about the biopsies in 3 weeks.   Await for biopsy results Polyps (handout given) Hemorrhoids (handout given) Diverticulosis (handout given)  SIGNATURES/CONFIDENTIALITY: You and/or your care partner have signed paperwork which will be entered into your electronic medical record.  These signatures attest to the fact that that the information above on your After Visit Summary has been reviewed and is understood.  Full responsibility of the confidentiality of this discharge information lies with you and/or your care-partner.

## 2018-09-18 NOTE — Progress Notes (Signed)
Report to PACU, RN, vss, BBS= Clear.  

## 2018-09-18 NOTE — Op Note (Signed)
[place] Patient Name: Alisha Espinoza Procedure Date: 09/18/2018 10:22 AM MRN: [id number] Endoscopist: Mauri Pole , MD Age: 53 Referring MD:  Date of Birth: [birth year] Gender: Female Account #: [id number] Procedure:                Colonoscopy Indications:              High risk colon cancer surveillance: Personal                            history of adenoma (10 mm or greater in size), High                            risk colon cancer surveillance: Personal history of                            multiple (3 or more) adenomas Medicines:                Monitored Anesthesia Care Procedure:                Pre-Anesthesia Assessment:                           - Prior to the procedure, a History and Physical                            was performed, and patient medications and                            allergies were reviewed. The patient's tolerance of                            previous anesthesia was also reviewed. The risks                            and benefits of the procedure and the sedation                            options and risks were discussed with the patient.                            All questions were answered, and informed consent                            was obtained. Prior Anticoagulants: The patient has                            taken no previous anticoagulant or antiplatelet                            agents. ASA Grade Assessment: II - A patient with                            mild systemic disease. After reviewing the risks  and benefits, the patient was deemed in                            satisfactory condition to undergo the procedure.                           After obtaining informed consent, the colonoscope                            was passed under direct vision. Throughout the                            procedure, the patient's blood pressure, pulse, and                            oxygen saturations were  monitored continuously. The                            Colonoscope was introduced through the anus and                            advanced to the the cecum, identified by                            appendiceal orifice and ileocecal valve. The                            colonoscopy was performed without difficulty. The                            patient tolerated the procedure well. The quality                            of the bowel preparation was adequate. The                            ileocecal valve, appendiceal orifice, and rectum                            were photographed. Scope In: 10:26:51 AM Scope Out: 10:45:15 AM Scope Withdrawal Time: 0 hours 11 minutes 42 seconds  Total Procedure Duration: 0 hours 18 minutes 24 seconds  Findings:                 The perianal and digital rectal examinations were                            normal.                           A 2 mm polyp was found in the ascending colon. The                            polyp was sessile. The polyp was removed with a  cold biopsy forceps. Resection and retrieval were                            complete.                           A 3 mm polyp was found in the rectum. The polyp was                            sessile. The polyp was removed with a cold snare.                            Resection and retrieval were complete.                           Scattered small-mouthed diverticula were found in                            the sigmoid colon and descending colon.                            Peri-diverticular erythema was seen.                           Non-bleeding internal hemorrhoids were found during                            retroflexion. The hemorrhoids were medium-sized. Complications:            No immediate complications. Estimated Blood Loss:     Estimated blood loss was minimal. Impression:               - One 2 mm polyp in the ascending colon, removed                             with a cold biopsy forceps. Resected and retrieved.                           - One 3 mm polyp in the rectum, removed with a cold                            snare. Resected and retrieved.                           - Mild diverticulosis in the sigmoid colon and in                            the descending colon. Peri-diverticular erythema                            was seen.                           - Non-bleeding internal hemorrhoids. Recommendation:           - Patient has a contact  number available for                            emergencies. The signs and symptoms of potential                            delayed complications were discussed with the                            patient. Return to normal activities tomorrow.                            Written discharge instructions were provided to the                            patient.                           - Resume previous diet.                           - Continue present medications.                           - Await pathology results.                           - Repeat colonoscopy in 5 years for surveillance                            based on pathology results. Mauri Pole, MD 09/18/2018 10:51:51 AM This report has been signed electronically.

## 2018-09-18 NOTE — Progress Notes (Signed)
Temps-cw  Vital signs-sb,r.n.

## 2018-09-18 NOTE — Progress Notes (Signed)
Called to room to assist during endoscopic procedure.  Patient ID and intended procedure confirmed with present staff. Received instructions for my participation in the procedure from the performing physician.  

## 2018-09-21 ENCOUNTER — Other Ambulatory Visit: Payer: Self-pay

## 2018-09-21 ENCOUNTER — Encounter (HOSPITAL_COMMUNITY)
Admission: RE | Admit: 2018-09-21 | Discharge: 2018-09-21 | Disposition: A | Discharge status: Home / Self Care | Payer: BC Managed Care – PPO | Source: Ambulatory Visit | Attending: Neurological Surgery | Admitting: Neurological Surgery

## 2018-09-21 ENCOUNTER — Encounter (HOSPITAL_COMMUNITY): Payer: Self-pay

## 2018-09-21 ENCOUNTER — Ambulatory Visit (HOSPITAL_COMMUNITY)
Admission: RE | Admit: 2018-09-21 | Discharge: 2018-09-21 | Disposition: A | Discharge status: Home / Self Care | Payer: BC Managed Care – PPO | Source: Ambulatory Visit | Attending: Neurological Surgery | Admitting: Neurological Surgery

## 2018-09-21 DIAGNOSIS — M48061 Spinal stenosis, lumbar region without neurogenic claudication: Secondary | ICD-10-CM | POA: Diagnosis not present

## 2018-09-21 DIAGNOSIS — R05 Cough: Secondary | ICD-10-CM | POA: Diagnosis not present

## 2018-09-21 HISTORY — DX: Dyspnea, unspecified: R06.00

## 2018-09-21 LAB — CBC WITH DIFFERENTIAL/PLATELET
Abs Immature Granulocytes: 0.02 10*3/uL (ref 0.00–0.07)
Basophils Absolute: 0.1 10*3/uL (ref 0.0–0.1)
Basophils Relative: 1 %
Eosinophils Absolute: 0.3 10*3/uL (ref 0.0–0.5)
Eosinophils Relative: 4 %
HCT: 49.1 % — ABNORMAL HIGH (ref 36.0–46.0)
Hemoglobin: 15.5 g/dL — ABNORMAL HIGH (ref 12.0–15.0)
Immature Granulocytes: 0 %
Lymphocytes Relative: 31 %
Lymphs Abs: 2.3 10*3/uL (ref 0.7–4.0)
MCH: 26 pg (ref 26.0–34.0)
MCHC: 31.6 g/dL (ref 30.0–36.0)
MCV: 82.4 fL (ref 80.0–100.0)
Monocytes Absolute: 0.6 10*3/uL (ref 0.1–1.0)
Monocytes Relative: 8 %
Neutro Abs: 4.1 10*3/uL (ref 1.7–7.7)
Neutrophils Relative %: 56 %
Platelets: 207 10*3/uL (ref 150–400)
RBC: 5.96 MIL/uL — ABNORMAL HIGH (ref 3.87–5.11)
RDW: 16.2 % — ABNORMAL HIGH (ref 11.5–15.5)
WBC: 7.5 10*3/uL (ref 4.0–10.5)
nRBC: 0 % (ref 0.0–0.2)

## 2018-09-21 LAB — BASIC METABOLIC PANEL
Anion gap: 9 (ref 5–15)
BUN: 11 mg/dL (ref 6–20)
CO2: 24 mmol/L (ref 22–32)
Calcium: 9.9 mg/dL (ref 8.9–10.3)
Chloride: 109 mmol/L (ref 98–111)
Creatinine, Ser: 0.76 mg/dL (ref 0.44–1.00)
GFR calc Af Amer: >60 mL/min (ref >60)
GFR calc non Af Amer: >60 mL/min (ref >60)
Glucose, Bld: 117 mg/dL — ABNORMAL HIGH (ref 70–99)
Potassium: 3.8 mmol/L (ref 3.5–5.1)
Sodium: 142 mmol/L (ref 135–145)

## 2018-09-21 LAB — PROTIME-INR
INR: 1.1 (ref 0.8–1.2)
Prothrombin Time: 13.6 seconds (ref 11.4–15.2)

## 2018-09-21 LAB — ABO/RH: ABO/RH(D): B POS

## 2018-09-21 LAB — TYPE AND SCREEN
ABO/RH(D): B POS
Antibody Screen: NEGATIVE

## 2018-09-21 LAB — SURGICAL PCR SCREEN
MRSA, PCR: NEGATIVE
Staphylococcus aureus: NEGATIVE

## 2018-09-21 LAB — GLUCOSE, CAPILLARY: Glucose-Capillary: 106 mg/dL — ABNORMAL HIGH (ref 70–99)

## 2018-09-21 NOTE — Pre-Procedure Instructions (Addendum)
Alisha Espinoza  09/21/2018      [place], [place] [address] Phone: [phone] Fax: [phone]  [place], [place] - [address] [place] [address] Phone: [phone] Fax: [place], [place] [id number] Phone: [phone] Fax: [phone]    Your procedure is scheduled on Monday, June 29.  Report to [facility], Main Entrance or Entrance "A"  AT 8:25 A.M.               Your surgery or procedure is scheduled for 10:25 AM   Call this number if you have problems the morning of surgery: [phone]  This is the number for the Pre- Surgical Desk.                      For any other questions prior to surgery Monday - Friday, 8:00 AM - 4:00 PM, call [phone]- PAT desk, ask to speak to any nurse.  Remember:  Do not eat or drink after midnight Sunday, June 28    Take these medicines the morning of surgery with A SIP OF WATER:  simvastatin (ZOCOR)                Take if needed:               oxyCODONE (ROXICODONE)               tiZANidine (ZANAFLEX)               Albuterol Inhaler- please bring it with you.  1 Week prior to surgery STOP taking Aspirin, Aspirin Products (Goody Powder, Excedrin Migraine), Ibuprofen (Advil), Naproxen (Aleve), Vitamins and Herbal Products (ie Fish Oil).        Special instructions:   Alisha Espinoza- Preparing For Surgery  Before surgery, you can play an important role. Because skin is not sterile, your skin needs to be as free of germs as possible. You can reduce the number of germs on your skin by washing with CHG (chlorahexidine gluconate) Soap before surgery.  CHG is an antiseptic cleaner which kills germs and bonds with the skin to continue killing germs even after washing.     Oral Hygiene is also important to reduce your risk of infection.  Remember - BRUSH YOUR TEETH THE MORNING OF SURGERY WITH YOUR REGULAR TOOTHPASTE  Please do not use if you have an allergy to CHG or antibacterial soaps. If your skin becomes reddened/irritated stop using the CHG.  Do not shave (including legs and underarms) for at least 48 hours prior to first CHG shower. It is OK to shave your face.  Please follow these instructions carefully.   1. Shower the NIGHT BEFORE SURGERY and the MORNING OF SURGERY with CHG.   2. If you chose to wash your hair, wash your hair first as usual with your normal shampoo.  3. After you shampoo, rinse your hair and body thoroughly to remove the shampoo.  4. Use CHG as you would any other liquid soap. You can apply CHG directly to the skin and wash gently with a scrungie or a clean washcloth.   5. Apply the CHG Soap to your body ONLY FROM THE NECK DOWN.  Do not use  on open wounds or open sores. Avoid contact with your eyes, ears, mouth and genitals (private parts). Wash Face and genitals (private parts)  with your normal soap.  6. Wash thoroughly, paying special attention to the area where your surgery will be performed.  7. Thoroughly rinse your body with warm water from the neck down.  8. DO NOT shower/wash with your normal soap after using and rinsing off the CHG Soap.  9. Pat yourself dry with a CLEAN TOWEL.  10. Wear CLEAN PAJAMAS to bed the night before surgery, wear comfortable clothes the morning of surgery  11. Place CLEAN SHEETS on your bed the night of your first shower and DO NOT SLEEP WITH PETS.    Day of Surgery: Shower as stated Above Do not wear lotions, powders, or perfumes, or deodorant. Please wear clean clothes to the hospital/surgery center.   Remember to brush your teeth WITH YOUR REGULAR TOOTHPASTE.  Do not wear jewelry, make-up or nail polish.  Do not shave 48 hours prior to surgery.    Do not bring valuables to the  hospital.  [facility] is not responsible for any belongings or valuables.  Contacts, dentures or bridgework may not be worn into surgery.  Leave your suitcase in the car.  After surgery it may be brought to your room.  For patients admitted to the hospital, discharge time will be determined by your treatment team.  Read over handouts you were given: Pain Booklet, Patient Instructions for Mupirocin Application, Coughing and Deep Breathing, Surgical Site Infections.

## 2018-09-21 NOTE — Progress Notes (Signed)
  Coronavirus Screening Pt scheduled to test for COVID test on 09/25/18 Have you experienced the following symptoms:  Cough yes/no: No Fever (>100.67F)  yes/no: No Runny nose yes/no: No Sore throat yes/no: No Difficulty breathing/shortness of breath  yes/no: No Have you or a family member traveled in the last 14 days and where? yes/no: No   PCP - Dr. Rachell Cipro  Cardiologist - denies  Chest x-ray - 09-21-18  EKG - 09-21-18  Stress Test - denies  ECHO - denies  Cardiac Cath - denies  AICD-denies PM-denies LOOP-denies  Sleep Study - denies CPAP - NA  LABS-PCR,CBCdiff,BMP,PT,T/S  ASA-denies  ERAS-NA  Pt denies being a diabetic. But states that she was told in the past that she was borderline diabetic. CBG checked=106 -  Checks Blood Sugar ____0_ times a day  Anesthesia-N  Pt denies having chest pain, sob, or fever at this time. All instructions explained to the pt, with a verbal understanding of the material. Pt agrees to go over the instructions while at home for a better understanding. The opportunity to ask questions was provided.

## 2018-09-22 ENCOUNTER — Encounter: Payer: Self-pay | Admitting: Gastroenterology

## 2018-09-22 ENCOUNTER — Telehealth: Payer: Self-pay

## 2018-09-22 DIAGNOSIS — G5603 Carpal tunnel syndrome, bilateral upper limbs: Secondary | ICD-10-CM | POA: Diagnosis not present

## 2018-09-22 NOTE — Telephone Encounter (Signed)
  Follow up Call-  Call back number 09/18/2018 08/27/2017  Post procedure Call Back phone  # [phone]  Permission to leave phone message Yes Yes  Some recent data might be hidden     Patient questions:  Do you have a fever, pain , or abdominal swelling? No. Pain Score  0 *  Have you tolerated food without any problems? Yes.    Have you been able to return to your normal activities? Yes.    Do you have any questions about your discharge instructions: Diet   No. Medications  No. Follow up visit  No.  Do you have questions or concerns about your Care? No.  Actions: * If pain score is 4 or above: No action needed, pain <4.   1. Have you developed a fever since your procedure? no  2.   Have you had an respiratory symptoms (SOB or cough) since your procedure? no  3.   Have you tested positive for COVID 19 since your procedure no  4.   Have you had any family members/close contacts diagnosed with the COVID 19 since your procedure?  no   If yes to any of these questions please route to Joylene John, RN and Alphonsa Gin, Therapist, sports.

## 2018-09-25 ENCOUNTER — Other Ambulatory Visit (HOSPITAL_COMMUNITY)
Admission: RE | Admit: 2018-09-25 | Discharge: 2018-09-25 | Disposition: A | Discharge status: Home / Self Care | Payer: BC Managed Care – PPO | Source: Ambulatory Visit | Attending: Neurological Surgery | Admitting: Neurological Surgery

## 2018-09-25 DIAGNOSIS — Z1159 Encounter for screening for other viral diseases: Secondary | ICD-10-CM | POA: Diagnosis not present

## 2018-09-25 LAB — SARS CORONAVIRUS 2 (TAT 6-24 HRS): SARS Coronavirus 2: NEGATIVE

## 2018-09-28 ENCOUNTER — Other Ambulatory Visit: Payer: Self-pay

## 2018-09-28 ENCOUNTER — Encounter: Payer: BLUE CROSS/BLUE SHIELD | Admitting: Gastroenterology

## 2018-09-28 ENCOUNTER — Inpatient Hospital Stay (HOSPITAL_COMMUNITY)
Admission: RE | Admit: 2018-09-28 | Discharge: 2018-09-29 | DRG: 455 | Disposition: A | Discharge status: Home / Self Care | Payer: BC Managed Care – PPO | Attending: Neurological Surgery | Admitting: Neurological Surgery

## 2018-09-28 ENCOUNTER — Encounter (HOSPITAL_COMMUNITY): Payer: Self-pay | Admitting: Certified Registered Nurse Anesthetist

## 2018-09-28 ENCOUNTER — Inpatient Hospital Stay (HOSPITAL_COMMUNITY): Payer: BC Managed Care – PPO

## 2018-09-28 ENCOUNTER — Inpatient Hospital Stay (HOSPITAL_COMMUNITY): Payer: BC Managed Care – PPO | Admitting: Certified Registered Nurse Anesthetist

## 2018-09-28 ENCOUNTER — Encounter (HOSPITAL_COMMUNITY)
Admission: RE | Disposition: A | Discharge status: Home / Self Care | Payer: Self-pay | Source: Home / Self Care | Attending: Neurological Surgery

## 2018-09-28 DIAGNOSIS — Z90710 Acquired absence of both cervix and uterus: Secondary | ICD-10-CM | POA: Diagnosis not present

## 2018-09-28 DIAGNOSIS — Z888 Allergy status to other drugs, medicaments and biological substances status: Secondary | ICD-10-CM | POA: Diagnosis not present

## 2018-09-28 DIAGNOSIS — Z79891 Long term (current) use of opiate analgesic: Secondary | ICD-10-CM

## 2018-09-28 DIAGNOSIS — M545 Low back pain: Secondary | ICD-10-CM | POA: Diagnosis not present

## 2018-09-28 DIAGNOSIS — G629 Polyneuropathy, unspecified: Secondary | ICD-10-CM | POA: Diagnosis not present

## 2018-09-28 DIAGNOSIS — Z419 Encounter for procedure for purposes other than remedying health state, unspecified: Secondary | ICD-10-CM

## 2018-09-28 DIAGNOSIS — Z8249 Family history of ischemic heart disease and other diseases of the circulatory system: Secondary | ICD-10-CM | POA: Diagnosis not present

## 2018-09-28 DIAGNOSIS — M47816 Spondylosis without myelopathy or radiculopathy, lumbar region: Principal | ICD-10-CM | POA: Diagnosis present

## 2018-09-28 DIAGNOSIS — G5601 Carpal tunnel syndrome, right upper limb: Secondary | ICD-10-CM | POA: Diagnosis present

## 2018-09-28 DIAGNOSIS — Z79899 Other long term (current) drug therapy: Secondary | ICD-10-CM | POA: Diagnosis not present

## 2018-09-28 DIAGNOSIS — M48061 Spinal stenosis, lumbar region without neurogenic claudication: Secondary | ICD-10-CM | POA: Diagnosis present

## 2018-09-28 DIAGNOSIS — F17210 Nicotine dependence, cigarettes, uncomplicated: Secondary | ICD-10-CM | POA: Diagnosis present

## 2018-09-28 DIAGNOSIS — Z1159 Encounter for screening for other viral diseases: Secondary | ICD-10-CM

## 2018-09-28 DIAGNOSIS — M5136 Other intervertebral disc degeneration, lumbar region: Secondary | ICD-10-CM | POA: Diagnosis not present

## 2018-09-28 DIAGNOSIS — Z833 Family history of diabetes mellitus: Secondary | ICD-10-CM

## 2018-09-28 DIAGNOSIS — M4036 Flatback syndrome, lumbar region: Secondary | ICD-10-CM | POA: Diagnosis not present

## 2018-09-28 DIAGNOSIS — Z981 Arthrodesis status: Secondary | ICD-10-CM | POA: Diagnosis not present

## 2018-09-28 DIAGNOSIS — E785 Hyperlipidemia, unspecified: Secondary | ICD-10-CM | POA: Diagnosis not present

## 2018-09-28 DIAGNOSIS — Z800 Family history of malignant neoplasm of digestive organs: Secondary | ICD-10-CM

## 2018-09-28 DIAGNOSIS — M961 Postlaminectomy syndrome, not elsewhere classified: Secondary | ICD-10-CM | POA: Diagnosis not present

## 2018-09-28 HISTORY — PX: CARPAL TUNNEL RELEASE: SHX101

## 2018-09-28 SURGERY — POSTERIOR LUMBAR FUSION 2 LEVEL
Anesthesia: General | Site: Hand | Laterality: Right

## 2018-09-28 MED ORDER — DEXAMETHASONE SODIUM PHOSPHATE 10 MG/ML IJ SOLN
INTRAMUSCULAR | Status: AC
  Filled 2018-09-28: qty 1

## 2018-09-28 MED ORDER — SENNA 8.6 MG PO TABS
1.0000 | ORAL_TABLET | Freq: Two times a day (BID) | ORAL | Status: DC
  Administered 2018-09-28 – 2018-09-29 (×2): 8.6 mg via ORAL
  Filled 2018-09-28 (×2): qty 1

## 2018-09-28 MED ORDER — ACETAMINOPHEN 10 MG/ML IV SOLN
INTRAVENOUS | Status: AC
  Filled 2018-09-28: qty 100

## 2018-09-28 MED ORDER — SUCCINYLCHOLINE CHLORIDE 200 MG/10ML IV SOSY
PREFILLED_SYRINGE | INTRAVENOUS | Status: AC
  Filled 2018-09-28: qty 10

## 2018-09-28 MED ORDER — FENTANYL CITRATE (PF) 100 MCG/2ML IJ SOLN
25.0000 ug | INTRAMUSCULAR | Status: AC | PRN
  Administered 2018-09-28 (×6): 25 ug via INTRAVENOUS

## 2018-09-28 MED ORDER — MENTHOL 3 MG MT LOZG: 1.0000 | LOZENGE | OROMUCOSAL | Status: DC | PRN

## 2018-09-28 MED ORDER — TIZANIDINE HCL 4 MG PO TABS
4.0000 mg | ORAL_TABLET | Freq: Four times a day (QID) | ORAL | Status: DC | PRN
  Administered 2018-09-28 – 2018-09-29 (×2): 4 mg via ORAL
  Filled 2018-09-28 (×2): qty 1

## 2018-09-28 MED ORDER — LIDOCAINE 2% (20 MG/ML) 5 ML SYRINGE
INTRAMUSCULAR | Status: DC | PRN
  Administered 2018-09-28: 100 mg via INTRAVENOUS

## 2018-09-28 MED ORDER — PHENYLEPHRINE 40 MCG/ML (10ML) SYRINGE FOR IV PUSH (FOR BLOOD PRESSURE SUPPORT)
PREFILLED_SYRINGE | INTRAVENOUS | Status: AC
  Filled 2018-09-28: qty 10

## 2018-09-28 MED ORDER — SODIUM CHLORIDE 0.9% FLUSH: 3.0000 mL | Freq: Two times a day (BID) | INTRAVENOUS | Status: DC

## 2018-09-28 MED ORDER — ONDANSETRON HCL 4 MG PO TABS: 4.0000 mg | ORAL_TABLET | Freq: Four times a day (QID) | ORAL | Status: DC | PRN

## 2018-09-28 MED ORDER — POTASSIUM CHLORIDE IN NACL 20-0.9 MEQ/L-% IV SOLN: INTRAVENOUS | Status: DC

## 2018-09-28 MED ORDER — VANCOMYCIN HCL 1000 MG IV SOLR
INTRAVENOUS | Status: DC | PRN
  Administered 2018-09-28: 1000 mg

## 2018-09-28 MED ORDER — ONDANSETRON HCL 4 MG/2ML IJ SOLN
INTRAMUSCULAR | Status: AC
  Filled 2018-09-28: qty 2

## 2018-09-28 MED ORDER — FENTANYL CITRATE (PF) 100 MCG/2ML IJ SOLN
INTRAMUSCULAR | Status: AC
  Administered 2018-09-28: 25 ug via INTRAVENOUS
  Filled 2018-09-28: qty 2

## 2018-09-28 MED ORDER — SODIUM CHLORIDE 0.9% FLUSH: 3.0000 mL | INTRAVENOUS | Status: DC | PRN

## 2018-09-28 MED ORDER — SUGAMMADEX SODIUM 200 MG/2ML IV SOLN
INTRAVENOUS | Status: DC | PRN
  Administered 2018-09-28 (×2): 100 mg via INTRAVENOUS

## 2018-09-28 MED ORDER — CEFAZOLIN SODIUM-DEXTROSE 2-4 GM/100ML-% IV SOLN
2.0000 g | Freq: Three times a day (TID) | INTRAVENOUS | Status: DC
  Administered 2018-09-28: 23:00:00 2 g via INTRAVENOUS
  Filled 2018-09-28: qty 100

## 2018-09-28 MED ORDER — CEFAZOLIN SODIUM-DEXTROSE 2-4 GM/100ML-% IV SOLN
INTRAVENOUS | Status: AC
  Filled 2018-09-28: qty 100

## 2018-09-28 MED ORDER — FENTANYL CITRATE (PF) 100 MCG/2ML IJ SOLN
INTRAMUSCULAR | Status: DC | PRN
  Administered 2018-09-28 (×10): 50 ug via INTRAVENOUS

## 2018-09-28 MED ORDER — 0.9 % SODIUM CHLORIDE (POUR BTL) OPTIME
TOPICAL | Status: DC | PRN
  Administered 2018-09-28: 1000 mL

## 2018-09-28 MED ORDER — THROMBIN 20000 UNITS EX SOLR
CUTANEOUS | Status: AC
  Filled 2018-09-28: qty 20000

## 2018-09-28 MED ORDER — ACETAMINOPHEN 10 MG/ML IV SOLN
INTRAVENOUS | Status: DC | PRN
  Administered 2018-09-28: 1000 mg via INTRAVENOUS

## 2018-09-28 MED ORDER — CHLORHEXIDINE GLUCONATE CLOTH 2 % EX PADS: 6.0000 | MEDICATED_PAD | Freq: Once | CUTANEOUS | Status: DC

## 2018-09-28 MED ORDER — ONDANSETRON HCL 4 MG/2ML IJ SOLN
INTRAMUSCULAR | Status: DC | PRN
  Administered 2018-09-28: 4 mg via INTRAVENOUS

## 2018-09-28 MED ORDER — VANCOMYCIN HCL 1000 MG IV SOLR
INTRAVENOUS | Status: AC
  Filled 2018-09-28: qty 1000

## 2018-09-28 MED ORDER — HYDROMORPHONE HCL 1 MG/ML IJ SOLN
0.5000 mg | INTRAMUSCULAR | Status: DC | PRN
  Administered 2018-09-28 (×2): 1 mg via INTRAVENOUS
  Filled 2018-09-28 (×2): qty 1

## 2018-09-28 MED ORDER — DEXAMETHASONE 4 MG PO TABS: 4.0000 mg | ORAL_TABLET | Freq: Four times a day (QID) | ORAL | Status: DC

## 2018-09-28 MED ORDER — ROCURONIUM BROMIDE 10 MG/ML (PF) SYRINGE
PREFILLED_SYRINGE | INTRAVENOUS | Status: DC | PRN
  Administered 2018-09-28 (×2): 20 mg via INTRAVENOUS
  Administered 2018-09-28: 40 mg via INTRAVENOUS
  Administered 2018-09-28 (×2): 20 mg via INTRAVENOUS

## 2018-09-28 MED ORDER — ALBUTEROL SULFATE (2.5 MG/3ML) 0.083% IN NEBU: 3.0000 mL | INHALATION_SOLUTION | RESPIRATORY_TRACT | Status: DC | PRN

## 2018-09-28 MED ORDER — BUPIVACAINE HCL (PF) 0.25 % IJ SOLN
INTRAMUSCULAR | Status: AC
  Filled 2018-09-28: qty 30

## 2018-09-28 MED ORDER — SODIUM CHLORIDE 0.9 % IV SOLN
INTRAVENOUS | Status: DC | PRN
  Administered 2018-09-28: 30 ug/min via INTRAVENOUS

## 2018-09-28 MED ORDER — ROCURONIUM BROMIDE 10 MG/ML (PF) SYRINGE
PREFILLED_SYRINGE | INTRAVENOUS | Status: AC
  Filled 2018-09-28: qty 10

## 2018-09-28 MED ORDER — PHENYLEPHRINE 40 MCG/ML (10ML) SYRINGE FOR IV PUSH (FOR BLOOD PRESSURE SUPPORT)
PREFILLED_SYRINGE | INTRAVENOUS | Status: DC | PRN
  Administered 2018-09-28: 120 ug via INTRAVENOUS
  Administered 2018-09-28: 80 ug via INTRAVENOUS
  Administered 2018-09-28: 120 ug via INTRAVENOUS
  Administered 2018-09-28: 80 ug via INTRAVENOUS

## 2018-09-28 MED ORDER — THROMBIN 20000 UNITS EX SOLR
CUTANEOUS | Status: DC | PRN
  Administered 2018-09-28: 20 mL

## 2018-09-28 MED ORDER — DIAZEPAM 5 MG/ML IJ SOLN
2.5000 mg | Freq: Once | INTRAMUSCULAR | Status: AC
  Administered 2018-09-28: 2.5 mg via INTRAVENOUS

## 2018-09-28 MED ORDER — ACETAMINOPHEN 650 MG RE SUPP: 650.0000 mg | RECTAL | Status: DC | PRN

## 2018-09-28 MED ORDER — MIDAZOLAM HCL 2 MG/2ML IJ SOLN
INTRAMUSCULAR | Status: AC
  Filled 2018-09-28: qty 2

## 2018-09-28 MED ORDER — CEFAZOLIN SODIUM 1 G IJ SOLR
INTRAMUSCULAR | Status: AC
  Filled 2018-09-28: qty 20

## 2018-09-28 MED ORDER — DIAZEPAM 5 MG/ML IJ SOLN
INTRAMUSCULAR | Status: AC
  Filled 2018-09-28: qty 2

## 2018-09-28 MED ORDER — PROPOFOL 10 MG/ML IV BOLUS
INTRAVENOUS | Status: DC | PRN
  Administered 2018-09-28: 150 mg via INTRAVENOUS
  Administered 2018-09-28: 50 mg via INTRAVENOUS

## 2018-09-28 MED ORDER — BACITRACIN ZINC 500 UNIT/GM EX OINT
TOPICAL_OINTMENT | CUTANEOUS | Status: AC
  Filled 2018-09-28: qty 28.35

## 2018-09-28 MED ORDER — ALBUMIN HUMAN 5 % IV SOLN
INTRAVENOUS | Status: DC | PRN
  Administered 2018-09-28: 14:00:00 via INTRAVENOUS

## 2018-09-28 MED ORDER — MIDAZOLAM HCL 5 MG/5ML IJ SOLN
INTRAMUSCULAR | Status: DC | PRN
  Administered 2018-09-28: 2 mg via INTRAVENOUS

## 2018-09-28 MED ORDER — HEPARIN SODIUM (PORCINE) 1000 UNIT/ML IJ SOLN
INTRAMUSCULAR | Status: DC | PRN
  Administered 2018-09-28: 5000 [IU]

## 2018-09-28 MED ORDER — PHENOL 1.4 % MT LIQD: 1.0000 | OROMUCOSAL | Status: DC | PRN

## 2018-09-28 MED ORDER — CEFAZOLIN SODIUM-DEXTROSE 2-4 GM/100ML-% IV SOLN
2.0000 g | INTRAVENOUS | Status: AC
  Administered 2018-09-28 (×2): 2 g via INTRAVENOUS

## 2018-09-28 MED ORDER — DEXAMETHASONE SODIUM PHOSPHATE 4 MG/ML IJ SOLN
4.0000 mg | Freq: Four times a day (QID) | INTRAMUSCULAR | Status: DC
  Administered 2018-09-28 – 2018-09-29 (×2): 4 mg via INTRAVENOUS
  Filled 2018-09-28 (×2): qty 1

## 2018-09-28 MED ORDER — LACTATED RINGERS IV SOLN
INTRAVENOUS | Status: DC
  Administered 2018-09-28 (×2): via INTRAVENOUS

## 2018-09-28 MED ORDER — LIDOCAINE 2% (20 MG/ML) 5 ML SYRINGE
INTRAMUSCULAR | Status: AC
  Filled 2018-09-28: qty 5

## 2018-09-28 MED ORDER — FENTANYL CITRATE (PF) 250 MCG/5ML IJ SOLN
INTRAMUSCULAR | Status: AC
  Filled 2018-09-28: qty 5

## 2018-09-28 MED ORDER — THROMBIN 5000 UNITS EX SOLR
OROMUCOSAL | Status: DC | PRN
  Administered 2018-09-28: 5 mL

## 2018-09-28 MED ORDER — SODIUM CHLORIDE 0.9 % IV SOLN: 250.0000 mL | INTRAVENOUS | Status: DC

## 2018-09-28 MED ORDER — SUCCINYLCHOLINE CHLORIDE 200 MG/10ML IV SOSY
PREFILLED_SYRINGE | INTRAVENOUS | Status: DC | PRN
  Administered 2018-09-28: 110 mg via INTRAVENOUS

## 2018-09-28 MED ORDER — THROMBIN 5000 UNITS EX SOLR
CUTANEOUS | Status: AC
  Filled 2018-09-28: qty 5000

## 2018-09-28 MED ORDER — HEPARIN SODIUM (PORCINE) 1000 UNIT/ML IJ SOLN
INTRAMUSCULAR | Status: AC
  Filled 2018-09-28: qty 1

## 2018-09-28 MED ORDER — MORPHINE SULFATE (PF) 2 MG/ML IV SOLN
2.0000 mg | INTRAVENOUS | Status: DC | PRN
  Administered 2018-09-28: 2 mg via INTRAVENOUS
  Filled 2018-09-28: qty 1

## 2018-09-28 MED ORDER — DEXAMETHASONE SODIUM PHOSPHATE 10 MG/ML IJ SOLN
10.0000 mg | INTRAMUSCULAR | Status: AC
  Administered 2018-09-28: 10 mg via INTRAVENOUS

## 2018-09-28 MED ORDER — BUPIVACAINE HCL (PF) 0.25 % IJ SOLN
INTRAMUSCULAR | Status: DC | PRN
  Administered 2018-09-28: 14 mL

## 2018-09-28 MED ORDER — SODIUM CHLORIDE 0.9 % IV SOLN
INTRAVENOUS | Status: DC | PRN
  Administered 2018-09-28: 500 mL

## 2018-09-28 MED ORDER — OXYCODONE HCL 5 MG PO TABS
10.0000 mg | ORAL_TABLET | ORAL | Status: DC | PRN
  Administered 2018-09-28 – 2018-09-29 (×5): 10 mg via ORAL
  Filled 2018-09-28 (×5): qty 2

## 2018-09-28 MED ORDER — ONDANSETRON HCL 4 MG/2ML IJ SOLN: 4.0000 mg | Freq: Four times a day (QID) | INTRAMUSCULAR | Status: DC | PRN

## 2018-09-28 MED ORDER — PHENYLEPHRINE HCL (PRESSORS) 10 MG/ML IV SOLN
INTRAVENOUS | Status: AC
  Filled 2018-09-28: qty 1

## 2018-09-28 MED ORDER — ACETAMINOPHEN 325 MG PO TABS
650.0000 mg | ORAL_TABLET | ORAL | Status: DC | PRN
  Administered 2018-09-28 – 2018-09-29 (×2): 650 mg via ORAL
  Filled 2018-09-28 (×3): qty 2

## 2018-09-28 SURGICAL SUPPLY — 85 items
BAG DECANTER FOR FLEXI CONT (MISCELLANEOUS) ×3 IMPLANT
BANDAGE ACE 4X5 VEL STRL LF (GAUZE/BANDAGES/DRESSINGS) ×3 IMPLANT
BASKET BONE COLLECTION (BASKET) ×3 IMPLANT
BENZOIN TINCTURE PRP APPL 2/3 (GAUZE/BANDAGES/DRESSINGS) ×3 IMPLANT
BLADE CLIPPER SURG (BLADE) IMPLANT
BLADE SURG 15 STRL LF DISP TIS (BLADE) ×2 IMPLANT
BLADE SURG 15 STRL SS (BLADE) ×1
BNDG COHESIVE 4X5 WHT NS (GAUZE/BANDAGES/DRESSINGS) ×3 IMPLANT
BNDG GAUZE ELAST 4 BULKY (GAUZE/BANDAGES/DRESSINGS) ×3 IMPLANT
BUR MATCHSTICK NEURO 3.0 LAGG (BURR) ×3 IMPLANT
CABLE BIPOLOR RESECTION CORD (MISCELLANEOUS) ×3 IMPLANT
CAGE POROUS ATEC 7X9X25 10D (Cage) ×12 IMPLANT
CANISTER SUCT 3000ML PPV (MISCELLANEOUS) ×6 IMPLANT
CARTRIDGE OIL MAESTRO DRILL (MISCELLANEOUS) ×4 IMPLANT
CONT SPEC 4OZ CLIKSEAL STRL BL (MISCELLANEOUS) ×3 IMPLANT
COVER BACK TABLE 60X90IN (DRAPES) ×3 IMPLANT
COVER WAND RF STERILE (DRAPES) ×6 IMPLANT
DERMABOND ADVANCED (GAUZE/BANDAGES/DRESSINGS) ×1
DERMABOND ADVANCED .7 DNX12 (GAUZE/BANDAGES/DRESSINGS) ×2 IMPLANT
DIFFUSER DRILL AIR PNEUMATIC (MISCELLANEOUS) ×6 IMPLANT
DRAPE C-ARM 42X72 X-RAY (DRAPES) ×3 IMPLANT
DRAPE C-ARMOR (DRAPES) ×3 IMPLANT
DRAPE EXTREMITY T 121X128X90 (DISPOSABLE) ×3 IMPLANT
DRAPE HALF SHEET 40X57 (DRAPES) ×3 IMPLANT
DRAPE LAPAROTOMY 100X72X124 (DRAPES) ×3 IMPLANT
DRAPE POUCH INSTRU U-SHP 10X18 (DRAPES) ×3 IMPLANT
DRAPE SURG 17X23 STRL (DRAPES) ×3 IMPLANT
DRSG ADAPTIC 3X8 NADH LF (GAUZE/BANDAGES/DRESSINGS) ×3 IMPLANT
DRSG OPSITE POSTOP 4X6 (GAUZE/BANDAGES/DRESSINGS) ×3 IMPLANT
DURAPREP 26ML APPLICATOR (WOUND CARE) ×6 IMPLANT
ELECT REM PT RETURN 9FT ADLT (ELECTROSURGICAL) ×3
ELECTRODE REM PT RTRN 9FT ADLT (ELECTROSURGICAL) ×2 IMPLANT
EVACUATOR 1/8 PVC DRAIN (DRAIN) ×6 IMPLANT
GAUZE 4X4 16PLY RFD (DISPOSABLE) ×6 IMPLANT
GAUZE SPONGE 4X4 12PLY STRL (GAUZE/BANDAGES/DRESSINGS) ×3 IMPLANT
GAUZE SPONGE 4X4 12PLY STRL LF (GAUZE/BANDAGES/DRESSINGS) ×3 IMPLANT
GLOVE BIO SURGEON STRL SZ7 (GLOVE) ×3 IMPLANT
GLOVE BIO SURGEON STRL SZ7.5 (GLOVE) ×12 IMPLANT
GLOVE BIO SURGEON STRL SZ8 (GLOVE) ×9 IMPLANT
GLOVE BIOGEL PI IND STRL 7.0 (GLOVE) ×6 IMPLANT
GLOVE BIOGEL PI IND STRL 7.5 (GLOVE) ×4 IMPLANT
GLOVE BIOGEL PI INDICATOR 7.0 (GLOVE) ×3
GLOVE BIOGEL PI INDICATOR 7.5 (GLOVE) ×2
GLOVE SS N UNI LF 7.0 STRL (GLOVE) ×12 IMPLANT
GOWN STRL REUS W/ TWL LRG LVL3 (GOWN DISPOSABLE) ×6 IMPLANT
GOWN STRL REUS W/ TWL XL LVL3 (GOWN DISPOSABLE) ×4 IMPLANT
GOWN STRL REUS W/TWL 2XL LVL3 (GOWN DISPOSABLE) ×3 IMPLANT
GOWN STRL REUS W/TWL LRG LVL3 (GOWN DISPOSABLE) ×3
GOWN STRL REUS W/TWL XL LVL3 (GOWN DISPOSABLE) ×2
HEMOSTAT POWDER KIT SURGIFOAM (HEMOSTASIS) IMPLANT
INTERLOCK LORDOTIC ROD 5.5 X70 (Rod) ×4 IMPLANT
KIT BASIN OR (CUSTOM PROCEDURE TRAY) ×6 IMPLANT
KIT BONE MRW ASP ANGEL CPRP (KITS) ×3 IMPLANT
KIT TURNOVER KIT B (KITS) ×6 IMPLANT
LORDOTIC ROD 5.5 X 70 (Rod) ×6 IMPLANT
MILL MEDIUM DISP (BLADE) IMPLANT
NEEDLE HYPO 25X1 1.5 SAFETY (NEEDLE) ×12 IMPLANT
NS IRRIG 1000ML POUR BTL (IV SOLUTION) ×6 IMPLANT
OIL CARTRIDGE MAESTRO DRILL (MISCELLANEOUS) ×6
PACK LAMINECTOMY NEURO (CUSTOM PROCEDURE TRAY) ×3 IMPLANT
PACK SURGICAL SETUP 50X90 (CUSTOM PROCEDURE TRAY) ×3 IMPLANT
PAD ARMBOARD 7.5X6 YLW CONV (MISCELLANEOUS) ×12 IMPLANT
PUTTY DBM ALLOSYNC PURE 10CC (Putty) ×3 IMPLANT
SCREW ILIAC PA 5.5X40 (Screw) ×12 IMPLANT
SCREW MOD INVICTUS 5.5X35 (Screw) ×6 IMPLANT
SCREW POLYAXIAL TULIP (Screw) ×6 IMPLANT
SET SCREW (Screw) ×6 IMPLANT
SET SCREW SPNE (Screw) ×12 IMPLANT
SPONGE LAP 4X18 RFD (DISPOSABLE) IMPLANT
SPONGE SURGIFOAM ABS GEL 100 (HEMOSTASIS) ×3 IMPLANT
STOCKINETTE 4X48 STRL (DRAPES) ×3 IMPLANT
STRIP CLOSURE SKIN 1/2X4 (GAUZE/BANDAGES/DRESSINGS) ×3 IMPLANT
SUT ETHILON 4 0 PS 2 18 (SUTURE) ×3 IMPLANT
SUT VIC AB 0 CT1 18XCR BRD8 (SUTURE) ×2 IMPLANT
SUT VIC AB 0 CT1 8-18 (SUTURE) ×1
SUT VIC AB 2-0 CP2 18 (SUTURE) ×3 IMPLANT
SUT VIC AB 3-0 SH 8-18 (SUTURE) ×12 IMPLANT
SYR BULB 3OZ (MISCELLANEOUS) ×3 IMPLANT
SYR CONTROL 10ML LL (SYRINGE) ×6 IMPLANT
TOWEL GREEN STERILE (TOWEL DISPOSABLE) ×6 IMPLANT
TOWEL GREEN STERILE FF (TOWEL DISPOSABLE) ×6 IMPLANT
TRAY FOLEY MTR SLVR 16FR STAT (SET/KITS/TRAYS/PACK) ×3 IMPLANT
TUBE CONNECTING 12X1/4 (SUCTIONS) ×3 IMPLANT
UNDERPAD 30X30 (UNDERPADS AND DIAPERS) ×3 IMPLANT
WATER STERILE IRR 1000ML POUR (IV SOLUTION) ×6 IMPLANT

## 2018-09-28 NOTE — Anesthesia Postprocedure Evaluation (Signed)
Anesthesia Post Note  Patient: Alisha Espinoza  Procedure(s) Performed: PLIF - L3-L4 - L4-L5 (N/A Back) CARPAL TUNNEL RELEASE (Right Hand)     Patient location during evaluation: PACU Anesthesia Type: General Level of consciousness: awake and alert Pain management: pain level controlled Vital Signs Assessment: post-procedure vital signs reviewed and stable Respiratory status: spontaneous breathing, nonlabored ventilation and respiratory function stable Cardiovascular status: blood pressure returned to baseline and stable Postop Assessment: no apparent nausea or vomiting Anesthetic complications: no    Last Vitals:  Vitals:   09/28/18 1635 09/28/18 1650  BP: (!) 171/119 (!) 157/96  Pulse: 96 95  Resp: 18 19  Temp:    SpO2: 98% 96%    Last Pain:  Vitals:   09/28/18 1639  TempSrc:   PainSc: 10-Worst pain ever                 Prisca Gearing,W. EDMOND

## 2018-09-28 NOTE — Op Note (Signed)
09/28/2018  3:53 PM  PATIENT:  Alisha Espinoza  53 y.o. 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 Decompressive lumbar laminectomy L2-3 L3-4 L4-5 requiring more work than would be required for a simple exposure of the disk for PLIF in order to adequately decompress the neural elements and address the spinal stenosis, note that L2-3 is separate from a fusion level 2. Posterior lumbar interbody fusion L3 4 L4-5 using porous titanium interbody cages packed with morcellized allograft and autograft soaked with a bone marrow aspirate obtained through a separate fascial incision over the left iliac crest 3. Posterior fixation L3-4 L4-5 using Alphatec cortical pedicle screws.  4. Intertransverse arthrodesis L3-L5 using morcellized autograft and allograft. 5.  Right carpal tunnel release  SURGEON:  Sherley Bounds, MD  ASSISTANTS: Glenford Peers FNP  ANESTHESIA:  General  EBL: 250 ml  Total I/O In: 1750 [I.V.:1500; IV Piggyback:250] Out: 600 [Urine:350; Blood:250]  BLOOD ADMINISTERED:none  DRAINS: none   INDICATION FOR PROCEDURE: This patient presented with severe back and right leg pain as well as right hand numbness and tingling. 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 The patient tried a reasonable attempt at conservative medical measures without relief. I recommended decompression and instrumented fusion to address the stenosis as well as the segmental  instability.  Still recommended a right carpal tunnel release.  Patient understood the risks, benefits, and alternatives and potential outcomes and wished to proceed.  PROCEDURE DETAILS:  The patient was brought to the operating room. After induction of  generalized endotracheal anesthesia, her right arm was extended on an armboard and prepped circumferentially from the fingertips to the elbow with DuraPrep.  Was then draped in the usual sterile fashion.  8 cc of local anesthesia injected and a small palmar incision was made from the distal wrist crease into the palm in line with the webspace between the third and fourth digits.  We dissected down through the palmar fascia and identified the transverse carpal ligament which was then opened with a 15 blade scalpel until the underlying median nerve was identified.  We then spread between the ligament and the nerve with a mosquito and transected the ligament completely both proximally and distally.  We palpated with a mosquito to make sure that the ligament was completely transected and the nerve was free.  We then irrigated with saline solution.  We dried all bleeding points.  We closed the palmar fascia with a single 3-0 Vicryl.  We closed the subcuticular tissue with 3-0 Vicryl.  We closed the skin with interrupted 4-0 Ethilon vertical mattress sutures.  Sterile dressing was applied and the hand was wrapped.  The patient was then repositioned onto the table by rolling the patient into the prone position on chest rolls and all pressure points were padded. The patient's lumbar region was cleaned and then prepped with DuraPrep and draped in the usual sterile fashion. Anesthesia was injected and then a dorsal midline incision was made and carried down to the lumbosacral fascia. The fascia was opened and the paraspinous musculature was taken down in a subperiosteal fashion to expose L2-3 L3-4 and L4-5. A self-retaining retractor was placed. Intraoperative fluoroscopy confirmed my level, and I started with placement of the L3 cortical pedicle screws. The pedicle screw  entry zones were identified utilizing surface landmarks and  AP and lateral fluoroscopy. I scored the cortex with the high-speed drill and then used the  hand drill to drill an upward and outward direction into the pedicle. I then tapped line to line. I then placed a 5.5 x 40 mm cortical pedicle screw into the pedicles of L3 bilaterally.  I then dissected in a suprafascial plane to expose the iliac crest.  Open the fascia used a Jamshidi needle to extract 60 cc of bone marrow aspirate from the iliac crest.  This was then spun down by [facility] device and 2 to 4 cc of  BMAC was soaked on morselized allograft for later arthrodesis.  I dried the hole with Surgifoam and closed the fascia.  I then turned my attention to the decompression and complete lumbar laminectomies, hemi- facetectomies, and foraminotomies were performed at L2-3 L3-4 and L4-5. The patient had significant spinal stenosis and this required more work than would be required for a simple exposure of the disc for posterior lumbar interbody fusion which would only require a limited laminotomy. Much more generous decompression and generous foraminotomy was undertaken in order to adequately decompress the neural elements and address the patient's leg pain. The yellow ligament was removed to expose the underlying dura and nerve roots, and generous foraminotomies were performed to adequately decompress the neural elements. Both the exiting and traversing nerve roots were decompressed on both sides until a coronary dilator passed easily along the nerve roots.  There was old surgery on the right at L3-4 and L4-5 and great care was taken to perform adhesio lysis and decompress the neural foramina.  She had severe foraminal stenosis.  Once the decompression was complete, I turned my attention to the posterior lower lumbar interbody fusion. The epidural venous vasculature was coagulated and cut sharply. Disc space was incised and the initial discectomy was performed with pituitary rongeurs. The disc space was distracted with sequential distractors to a height of 8 mm. We then used a series of scrapers and shavers to  prepare the endplates for fusion. The midline was prepared with Epstein curettes. Once the complete discectomy was finished, we packed an appropriate sized interbody cage with local autograft and morcellized allograft, gently retracted the nerve root, and tapped the cage into position at L3-4 and L4-5.  The midline between the cages was packed with morselized autograft and allograft. We then turned our attention to the placement of the lower pedicle screws. The pedicle screw entry zones were identified utilizing surface landmarks and fluoroscopy. I drilled into each pedicle utilizing the hand drill, and tapped each pedicle with the appropriate tap. We palpated with a ball probe to assure no break in the cortex. We then placed 5.5 x 40 mm cortical pedicle screws into the pedicles bilaterally at L4 and L5. We then decorticated the transverse processes and laid a mixture of morcellized autograft and allograft out over these to perform intertransverse arthrodesis at L3-L5. We then placed lordotic rods into the multiaxial screw heads of the pedicle screws and locked these in position with the locking caps and anti-torque device. We then checked our construct with AP and lateral fluoroscopy. Irrigated with copious amounts of bacitracin-containing saline solution. Inspected the nerve roots once again to assure adequate decompression, lined to the dura with Gelfoam, Placed a medium Hemovac drain through a separate stab incision, placed powdered vancomycin into the wound, and closed the muscle and the fascia with 0 Vicryl. Closed the subcutaneous tissues with  2-0 Vicryl and subcuticular tissues with 3-0 Vicryl. The skin was closed with benzoin and Steri-Strips. Dressing was then applied, the patient was awakened from general anesthesia and transported to the recovery room in stable condition. At the end of the procedure all sponge, needle and instrument counts were correct.   PLAN OF CARE: admit to inpatient  PATIENT  DISPOSITION:  PACU - hemodynamically stable.   Delay start of Pharmacological VTE agent (>24hrs) due to surgical blood loss or risk of bleeding:  yes

## 2018-09-28 NOTE — Transfer of Care (Signed)
Immediate Anesthesia Transfer of Care Note  Patient: Alisha Espinoza  Procedure(s) Performed: PLIF - L3-L4 - L4-L5 (N/A Back) CARPAL TUNNEL RELEASE (Right Hand)  Patient Location: PACU  Anesthesia Type:General  Level of Consciousness: awake, alert  and oriented  Airway & Oxygen Therapy: Patient Spontanous Breathing  Post-op Assessment: Report given to RN and Post -op Vital signs reviewed and stable  Post vital signs: Reviewed and stable  Last Vitals:  Vitals Value Taken Time  BP 160/140 09/28/18 1607  Temp 36.2 C 09/28/18 1606  Pulse 99 09/28/18 1607  Resp 16 09/28/18 1607  SpO2 93 % 09/28/18 1607  Vitals shown include unvalidated device data.  Last Pain:  Vitals:   09/28/18 0854  TempSrc:   PainSc: 9          Complications: No apparent anesthesia complications

## 2018-09-28 NOTE — Anesthesia Preprocedure Evaluation (Signed)
Anesthesia Evaluation  Patient identified by MRN, date of birth, ID band Patient awake    Airway Mallampati: II  TM Distance: >3 FB     Dental   Pulmonary pneumonia, Current Smoker,    breath sounds clear to auscultation       Cardiovascular + Valvular Problems/Murmurs  Rhythm:Regular Rate:Normal     Neuro/Psych    GI/Hepatic negative GI ROS, Neg liver ROS,   Endo/Other  negative endocrine ROS  Renal/GU negative Renal ROS     Musculoskeletal   Abdominal   Peds  Hematology   Anesthesia Other Findings   Reproductive/Obstetrics                             Anesthesia Physical Anesthesia Plan  ASA: III  Anesthesia Plan: General   Post-op Pain Management:    Induction: Intravenous  PONV Risk Score and Plan: Ondansetron and Midazolam  Airway Management Planned: Oral ETT  Additional Equipment:   Intra-op Plan:   Post-operative Plan: Extubation in OR  Informed Consent: I have reviewed the patients History and Physical, chart, labs and discussed the procedure including the risks, benefits and alternatives for the proposed anesthesia with the patient or authorized representative who has indicated his/her understanding and acceptance.       Plan Discussed with: CRNA and Anesthesiologist  Anesthesia Plan Comments:         Anesthesia Quick Evaluation

## 2018-09-28 NOTE — Anesthesia Procedure Notes (Signed)
Procedure Name: Intubation Date/Time: 09/28/2018 11:17 AM Performed by: Verdie Drown, CRNA Pre-anesthesia Checklist: Patient identified, Emergency Drugs available, Suction available and Patient being monitored Patient Re-evaluated:Patient Re-evaluated prior to induction Oxygen Delivery Method: Circle System Utilized Preoxygenation: Pre-oxygenation with 100% oxygen Induction Type: IV induction Ventilation: Mask ventilation without difficulty Laryngoscope Size: Mac and 3 Grade View: Grade I Tube type: Oral Tube size: 7.0 mm Number of attempts: 1 Airway Equipment and Method: Stylet and Oral airway Placement Confirmation: ETT inserted through vocal cords under direct vision,  positive ETCO2 and breath sounds checked- equal and bilateral Secured at: 22 cm Tube secured with: Tape Dental Injury: Teeth and Oropharynx as per pre-operative assessment

## 2018-09-28 NOTE — H&P (Signed)
Subjective: Patient is a 53 y.o. female admitted for right carpal tunnel syndrome and severe back and right leg pain. Onset of symptoms was several months ago, gradually worsening since that time.  The pain is rated severe, and is located at the across the lower back and radiates to right leg. The pain is described as aching and occurs all day. The symptoms have been progressive. Symptoms are exacerbated by exercise. MRI or CT showed severe foraminal stenosis and recurrent spinal stenosis and degenerative disc disease.  She has had a previous right L3-4 and L4-5 decompression and discectomy in the past.  Past Medical History:  Diagnosis Date  . Bronchitis   . Dyspnea   . Heart murmur    resolved "closed by age 82."   . Hyperlipidemia   . Seasonal allergies     Past Surgical History:  Procedure Laterality Date  . ABDOMINAL HYSTERECTOMY  2000  . ANTERIOR CERVICAL DECOMP/DISCECTOMY FUSION  03/19/2012   Procedure: ANTERIOR CERVICAL DECOMPRESSION/DISCECTOMY FUSION 1 LEVEL;  Surgeon: Melina Schools, MD;  Location: [place];  Service: Orthopedics;  Laterality: Left;  Total Disc Replacement C4-5  . ANTERIOR CERVICAL DECOMP/DISCECTOMY FUSION N/A 05/07/2012   Procedure: ANTERIOR CERVICAL DECOMPRESSION/DISCECTOMY FUSION 1 LEVEL/HARDWARE REMOVAL;  Surgeon: Melina Schools, MD;  Location: [place];  Service: Orthopedics;  Laterality: N/A;  REMOVAL OF CERVICAL DISC REPLACEMENT AND ACDF C4-5  . BACK SURGERY    . CERVICAL FUSION  05/07/2012   Dr Rolena Infante  . COLONOSCOPY    . CYSTECTOMY     L wrist  . POLYPECTOMY    . ROTATOR CUFF REPAIR     Right  . TYMPANOSTOMY TUBE PLACEMENT      Prior to Admission medications   Medication Sig Start Date End Date Taking? Authorizing Provider  albuterol (PROVENTIL HFA;VENTOLIN HFA) 108 (90 Base) MCG/ACT inhaler Inhale 1-2 puffs into the lungs every 4 (four) hours as needed for wheezing or shortness of breath (or coughing). 3/76/28  Yes Delora Fuel, MD  Ferrous Sulfate (IRON  PO) Take 1 tablet by mouth daily.    Yes [provider]  Multiple Vitamins-Calcium (ONE-A-DAY WOMENS PO) Take 1 tablet by mouth daily.    Yes [provider]  oxyCODONE (ROXICODONE) 5 MG immediate release tablet Take 1-2 tablets (5-10 mg total) by mouth every 4 (four) hours as needed for moderate pain or severe pain. 03/22/18  Yes Duffy Bruce, MD  simvastatin (ZOCOR) 20 MG tablet Take 20 mg by mouth daily. 01/26/18  Yes [provider]  tiZANidine (ZANAFLEX) 4 MG tablet Take 4 mg by mouth every 6 (six) hours as needed for muscle spasms.   Yes [provider]  vitamin B-12 (CYANOCOBALAMIN) 1000 MCG tablet Take 1,000 mcg by mouth daily.   Yes [provider]  vitamin C (ASCORBIC ACID) 500 MG tablet Take 500 mg by mouth daily.   Yes [provider]  VITAMIN D PO Take 1 tablet by mouth daily.    Yes [provider]   Allergies  Allergen Reactions  . Meloxicam Hives    Social History   Tobacco Use  . Smoking status: Current Every Day Smoker    Packs/day: 0.50    Years: 30.00    Pack years: 15.00    Types: Cigarettes  . Smokeless tobacco: Never Used  Substance Use Topics  . Alcohol use: Yes    Comment: occasional    Family History  Problem Relation Age of Onset  . Diabetes Mother   . Hypertension  Father   . Pancreatic cancer Sister   . Colon cancer Neg Hx   . Rectal cancer Neg Hx   . Stomach cancer Neg Hx   . Colon polyps Neg Hx   . Esophageal cancer Neg Hx      Review of Systems  Positive ROS: neg  All other systems have been reviewed and were otherwise negative with the exception of those mentioned in the HPI and as above.  Objective: Vital signs in last 24 hours: Temp:  [97.9 F (36.6 C)] 97.9 F (36.6 C) (06/29 0840) Pulse Rate:  [77] 77 (06/29 0840) Resp:  [18] 18 (06/29 0840) BP: (157)/(95) 157/95 (06/29 0840)  General Appearance: Alert, cooperative, no distress, appears stated age Head:  Normocephalic, without obvious abnormality, atraumatic Eyes: PERRL, conjunctiva/corneas clear, EOM's intact    Neck: Supple, symmetrical, trachea midline Back: Symmetric, no curvature, ROM normal, no CVA tenderness Lungs:  respirations unlabored Heart: Regular rate and rhythm Abdomen: Soft, non-tender Extremities: Extremities normal, atraumatic, no cyanosis or edema Pulses: 2+ and symmetric all extremities Skin: Skin color, texture, turgor normal, no rashes or lesions  NEUROLOGIC:   Mental status: Alert and oriented x4,  no aphasia, good attention span, fund of knowledge, and memory Motor Exam - grossly normal Sensory Exam - grossly normal Reflexes: 1+ Coordination - grossly normal Gait - grossly normal Balance - grossly normal Cranial Nerves: I: smell Not tested  II: visual acuity  OS: nl    OD: nl  II: visual fields Full to confrontation  II: pupils Equal, round, reactive to light  III,VII: ptosis None  III,IV,VI: extraocular muscles  Full ROM  V: mastication Normal  V: facial light touch sensation  Normal  V,VII: corneal reflex  Present  VII: facial muscle function - upper  Normal  VII: facial muscle function - lower Normal  VIII: hearing Not tested  IX: soft palate elevation  Normal  IX,X: gag reflex Present  XI: trapezius strength  5/5  XI: sternocleidomastoid strength 5/5  XI: neck flexion strength  5/5  XII: tongue strength  Normal    Data Review Lab Results  Component Value Date   WBC 7.5 09/21/2018   HGB 15.5 (H) 09/21/2018   HCT 49.1 (H) 09/21/2018   MCV 82.4 09/21/2018   PLT 207 09/21/2018   Lab Results  Component Value Date   NA 142 09/21/2018   K 3.8 09/21/2018   CL 109 09/21/2018   CO2 24 09/21/2018   BUN 11 09/21/2018   CREATININE 0.76 09/21/2018   GLUCOSE 117 (H) 09/21/2018   Lab Results  Component Value Date   INR 1.1 09/21/2018    Assessment/Plan:  Estimated body mass index is 32.58 kg/m as calculated from the following:   Height  as of 09/21/18: 5' 6.5" (1.689 m).   Weight as of 09/21/18: 92.9 kg. Patient admitted for right carpal tunnel release and posterior lumbar interbody fusion L3-4 L4-5.  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  She has had significant numbness in her right hand.  She was to proceed with carpal tunnel release at the same time as her back surgery.  He understands the risk of that particular procedure include but are not limited to bleeding, infection, nerve injury, vascular injury, numbness, weakness, need for further surgery, lack of relief of symptoms, worsening symptoms, and anesthesia risk.  Patient has failed a reasonable attempt at conservative therapy.  I explained the condition and procedure to the patient  and answered any questions.  Patient wishes to proceed with procedure as planned. Understands risks/ benefits and typical outcomes of procedure.   Eustace Moore 09/28/2018 10:21 AM

## 2018-09-29 ENCOUNTER — Encounter (HOSPITAL_COMMUNITY): Payer: Self-pay | Admitting: Neurological Surgery

## 2018-09-29 MED ORDER — OXYCODONE-ACETAMINOPHEN 10-325 MG PO TABS: 1.0000 | ORAL_TABLET | Freq: Four times a day (QID) | ORAL | 0 refills | Status: DC | PRN

## 2018-09-29 MED ORDER — TIZANIDINE HCL 4 MG PO TABS: 4.0000 mg | ORAL_TABLET | Freq: Four times a day (QID) | ORAL | 0 refills | Status: DC | PRN

## 2018-09-29 NOTE — Evaluation (Signed)
Occupational Therapy Evaluation Patient Details Name: Alisha Espinoza MRN: [id number] DOB: 30-Dec-1965 Today's Date: 09/29/2018    History of Present Illness Pt is a 53 y/o female s/p PLIF L3-4, L4-5 and R carpal tunnel release. PMH: bronchitis, ACDF, R rotator cuff repair.    Clinical Impression   PTA patient independent. Admitted for above and limited by problem list below, including precaution adherence, pain, balance and safety.  Patient educated on back precautions, brace mgmt and wear schedule, ADl compensatory techniques, safety and recommendations, as well as functional use/exercises to R UE s/p carpal tunnel release. Patient able to complete UB ADLs with supervision, LB ADLs with mod assist (but reports plan to use compensatory techniques with wearing gowns and slip on shoes only, and have her sister assist with bathing), transfers with min guard and grooming standing at sink with min guard. Pt reports her sister will assist as needed for the first week. She will benefit from continued OT services while admitted in order to maximize safety and independence with ADLs/mobility but anticipate no further needs after dc.     Follow Up Recommendations  No OT follow up;Supervision/Assistance - 24 hour    Equipment Recommendations  3 in 1 bedside commode    Recommendations for Other Services       Precautions / Restrictions Precautions Precautions: Back;Fall Precaution Booklet Issued: Yes (comment) Precaution Comments: pt able to verbally state but requires verbal cues to adhere functionally Required Braces or Orthoses: Spinal Brace Spinal Brace: Lumbar corset(on upon entry) Restrictions Weight Bearing Restrictions: No      Mobility Bed Mobility Overal bed mobility: Needs Assistance Bed Mobility: Sidelying to Sit   Sidelying to sit: Supervision       General bed mobility comments: OOB in recliner upon entry   Transfers Overall transfer level: Needs assistance Equipment  used: Rolling walker (2 wheeled) Transfers: Sit to/from Stand Sit to Stand: Min guard         General transfer comment: min guard for safety and balance, cueing for hand placmeent and technique; increased time and effort    Balance Overall balance assessment: Needs assistance Sitting-balance support: No upper extremity supported;Feet supported Sitting balance-Leahy Scale: Good     Standing balance support: Bilateral upper extremity supported;During functional activity Standing balance-Leahy Scale: Fair Standing balance comment: stands statically during ADls with min guard, but reliant on B UE support for mobility                           ADL either performed or assessed with clinical judgement   ADL Overall ADL's : Needs assistance/impaired     Grooming: Min guard;Standing Grooming Details (indicate cue type and reason): cueing for compensatory techniques and back precautions  Upper Body Bathing: Set up;Sitting   Lower Body Bathing: Minimal assistance;Sit to/from stand Lower Body Bathing Details (indicate cue type and reason): reviewed safety, use of long sponge, bathing seated; pt unable to complete figure 4 technqiue and reports her sister will assist as needed Upper Body Dressing : Set up;Supervision/safety;Sitting   Lower Body Dressing: Moderate assistance;Sit to/from stand;Cueing for back precautions;Cueing for compensatory techniques Lower Body Dressing Details (indicate cue type and reason): pt unable to complete figure 4 technique, educated on precautions and compesnatory techniques- reports plan to wear gowns (no underwear) and slip on shoes--sister can assist as needed Toilet Transfer: Min guard;Ambulation Toilet Transfer Details (indicate cue type and reason): simulated to recliner  Tub/Shower Transfer Details (indicate cue type and reason): pt reports she does not plan on taking a shower for a few weeks Functional mobility during ADLs: Min  guard;Cueing for sequencing General ADL Comments: pt limited by back precuation adherance and pain      Vision         Perception     Praxis      Pertinent Vitals/Pain Pain Assessment: Faces Pain Score: 10-Worst pain ever Faces Pain Scale: Hurts whole lot Pain Location: back Pain Descriptors / Indicators: Constant;Sharp;Radiating Pain Intervention(s): Monitored during session;Repositioned     Hand Dominance Right   Extremity/Trunk Assessment Upper Extremity Assessment Upper Extremity Assessment: Overall WFL for tasks assessed;RUE deficits/detail RUE Deficits / Details: s/p carpal tunnel release; sensation, strength, coordination WFL    Lower Extremity Assessment Lower Extremity Assessment: Defer to PT evaluation   Cervical / Trunk Assessment Cervical / Trunk Assessment: Other exceptions Cervical / Trunk Exceptions: s/p spinal sx   Communication Communication Communication: No difficulties   Cognition Arousal/Alertness: Awake/alert Behavior During Therapy: Anxious Overall Cognitive Status: Within Functional Limits for tasks assessed                                 General Comments: pt with good recall of precautions, but min cueing to adhere to functionally; easily distracted but anticipate this is baseline    General Comments  VSS    Exercises     Shoulder Instructions      Home Living Family/patient expects to be discharged to:: Private residence Living Arrangements: Alone Available Help at Discharge: Family;Available PRN/intermittently(reports her sister will assist for 1 week) Type of Home: House Home Access: Stairs to enter [employer] of Steps: 4 Entrance Stairs-Rails: Right;Left Home Layout: One level     Bathroom Shower/Tub: Teacher, early years/pre: Standard     Home Equipment: None   Additional Comments: pt reports she is going to sponge bathe and not get into tub      Prior Functioning/Environment  Level of Independence: Independent                 OT Problem List: Decreased strength;Decreased activity tolerance;Impaired balance (sitting and/or standing);Decreased safety awareness;Decreased knowledge of use of DME or AE;Decreased knowledge of precautions;Pain      OT Treatment/Interventions: Self-care/ADL training;DME and/or AE instruction;Therapeutic activities;Patient/family education;Balance training    OT Goals(Current goals can be found in the care plan section) Acute Rehab OT Goals Patient Stated Goal: stop the pain OT Goal Formulation: With patient Time For Goal Achievement: 10/13/18 Potential to Achieve Goals: Good  OT Frequency: Min 2X/week   Barriers to D/C:            Co-evaluation              AM-PAC OT "6 Clicks" Daily Activity     Outcome Measure Help from another person eating meals?: A Little Help from another person taking care of personal grooming?: A Little Help from another person toileting, which includes using toliet, bedpan, or urinal?: A Little Help from another person bathing (including washing, rinsing, drying)?: A Lot Help from another person to put on and taking off regular upper body clothing?: A Little Help from another person to put on and taking off regular lower body clothing?: A Lot 6 Click Score: 16   End of Session Equipment Utilized During Treatment: Back brace;Rolling walker Nurse Communication: Mobility status  Activity Tolerance: Patient  tolerated treatment well Patient left: in chair;with call bell/phone within reach  OT Visit Diagnosis: Other abnormalities of gait and mobility (R26.89);Muscle weakness (generalized) (M62.81);Pain Pain - part of body: (back- incisional)                Time: 4514-6047 OT Time Calculation (min): 22 min Charges:  OT General Charges $OT Visit: 1 Visit OT Evaluation $OT Eval Moderate Complexity: [place], OT Acute Rehabilitation Services Pager [phone] Office  [phone]   Delight Stare 09/29/2018, 10:08 AM

## 2018-09-29 NOTE — Progress Notes (Signed)
Pt given D/C instructions with verbal understanding. Rx's were sent to Pt's pharmacy by MD. Pt's incision is clean and dry with no sign of infection. Pt's IV and Hemovac were removed prior to D/C. Pt received RW and 3-n-1 per MD order for home use. Pt is stable @ D/C and has no other needs at this time. Holli Humbles, RN

## 2018-09-29 NOTE — Discharge Instructions (Signed)
Wound Care Keep incision covered and dry for one week.  If you shower prior to then, cover incision with plastic wrap.  You may remove outer bandage after one week and shower.  Do not put any creams, lotions, or ointments on incision. Leave steri-strips on neck.  They will fall off by themselves. Activity Walk each and every day, increasing distance each day. No lifting greater than 5 lbs.  Avoid bending, arching, or twisting. No driving for 2 weeks; may ride as a passenger locally. If provided with back brace, wear when out of bed.  It is not necessary to wear in bed. Diet Resume your normal diet.  Return to Work Will be discussed at you follow up appointment. Call Your Doctor If Any of These Occur Redness, drainage, or swelling at the wound.  Temperature greater than 101 degrees. Severe pain not relieved by pain medication. Incision starts to come apart. Follow Up Appt Call today for appointment in 1-2 weeks ([phone]) or for problems.  If you have any hardware placed in your spine, you will need an x-ray before your appointment.   Spinal Fusion, Adult, Care After This sheet gives you information about how to care for yourself after your procedure. Your doctor may also give you more specific instructions. If you have problems or questions, contact your doctor. Follow these instructions at home: Medicines  Take over-the-counter and prescription medicines only as told by your doctor. These include any medicines for pain or blood-thinning medicines (anticoagulants).  If you were prescribed an antibiotic medicine, take it as told by your doctor. Do not stop taking the antibiotic even if you start to feel better.  Do not drive for 24 hours if you were given a medicine to help you relax (sedative) during your procedure.  Do not drive or use heavy machinery while taking prescription pain medicine. If you have a brace:  Wear the brace as told by your doctor. Take it off only as told by  your doctor.  Keep the brace clean. Managing pain, stiffness, and swelling  If directed, put ice on the surgery area: ? If you have a removable brace, take it off as told by your doctor. ? Put ice in a plastic bag. ? Place a towel between your skin and the bag. ? Leave the ice on for 20 minutes, 2-3 times a day. Surgery cut care   Follow instructions from your doctor about how to take care of your cut from surgery (incision). Make sure you: ? Wash your hands with soap and water before you change your bandage (dressing). If you cannot use soap and water, use hand sanitizer. ? Change your bandage as told by your doctor. ? Leave stitches (sutures), skin glue, or skin tape (adhesive) strips in place. They may need to stay in place for 2 weeks or longer. If tape strips get loose and curl up, you may trim the loose edges. Do not remove tape strips completely unless your doctor says it is okay.  Keep your cut from surgery clean and dry. ? Do not take baths, swim, or use a hot tub until your doctor says it is okay. ? Ask your doctor if you can take showers. You may only be allowed to take sponge baths.  Every day, check your cut from surgery and the area around it for: ? More redness, swelling, or pain. ? Fluid or blood. ? Warmth. ? Pus or a bad smell.  If you have a drain tube, follow instructions  from your doctor about caring for it. Do not take out the drain tube or any bandages unless your doctor says it is okay. Physical activity  Rest and protect your back as much as possible.  Follow instructions from your doctor about how to move. Use good posture to help your spine heal.  Do not lift anything that is heavier than 8 lb (3.6 kg), or the limit that you are told, until your doctor says that it is safe.  Do not twist or bend at the waist until your doctor says it is okay.  It is best if you: ? Do not make pushing and pulling motions. ? Do not sit or lie down in the same position  for a long time. ? Do not raise your hands or arms above your head.  Return to your normal activities as told by your doctor. Ask your doctor what activities are safe for you. Rest and protect your back as much as you can.  Do not start to exercise until your doctor says it is okay. Ask your doctor what kinds of exercise you can do to make your back stronger. General instructions  To prevent blood clots and lessen swelling in your legs: ? Wear compression stockings as told. ? Walk one or more times every few hours as told by your doctor.  Do not use any products that contain nicotine or tobacco, such as cigarettes and e-cigarettes. These can delay bone healing. If you need help quitting, ask your doctor.  To prevent or treat constipation while you are taking prescription pain medicine, your doctor may suggest that you: ? Drink enough fluid to keep your pee (urine) pale yellow. ? Take over-the-counter or prescription medicines. ? Eat foods that are high in fiber. These include fresh fruits and vegetables, whole grains, and beans. ? Limit foods that are high in fat and processed sugars, such as fried and sweet foods.  Keep all follow-up visits as told by your doctor. This is important. Contact a doctor if:  Your pain gets worse.  Your medicine does not help your pain.  Your legs or feet get painful or swollen.  Your cut from surgery is more red, swollen, or painful.  Your cut from surgery feels warm to the touch.  You have: ? Fluid or blood coming from your cut from surgery. ? Pus or a bad smell coming from your cut from surgery. ? A fever. ? Weakness or loss of feeling (numbness) in your legs that is new or getting worse. ? Trouble controlling when you pee (urinate) or poop (have a bowel movement).  You feel sick to your stomach (nauseous).  You throw up (vomit). Get help right away if:  Your pain is very bad.  You have chest pain.  You have trouble breathing.  You  start to have a cough. These symptoms may be an emergency. Do not wait to see if the symptoms will go away. Get medical help right away. Call your local emergency services (911 in the [place] Do not drive yourself to the hospital. Summary  After the procedure, it is common to have pain in your back and pain by your surgery cut(s).  Icing and pain medicines may help to control the pain. Follow directions from your doctor.  Rest and protect your back as much as possible. Do not twist or bend at the waist.  Get up and walk one or more times every few hours as told by your doctor. This information  is not intended to replace advice given to you by your health care provider. Make sure you discuss any questions you have with your health care provider. Document Released: 07/12/2010 Document Revised: 07/09/2018 Document Reviewed: 07/02/2016 Elsevier Patient Education  [place].

## 2018-09-29 NOTE — Discharge Summary (Signed)
Physician Discharge Summary  Patient ID: Alisha Espinoza MRN: [id number] DOB/AGE: [id number] y.o.  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 The patient tolerated the procedure well and was taken to the recovery room and then to the floor in stable condition. The hospital course was routine. There were no complications. The wound remained clean dry and intact. Pt had appropriate back soreness. No complaints of leg pain or new N/T/W. The patient remained afebrile with stable vital signs, and tolerated a regular diet. The patient continued to increase activities, and pain was well controlled with oral pain medications.   Consults: None  Significant Diagnostic Studies:  Results for orders placed or performed during the hospital encounter of 09/25/18  SARS Coronavirus 2 (Performed in [facility] hospital lab)   Specimen: Nasal Swab  Result Value Ref Range   SARS Coronavirus 2 NEGATIVE NEGATIVE    Chest 2 View  Result Date: 09/21/2018 CLINICAL DATA:  Pre operative respiratory exam. Spinal stenosis. Cough. EXAM: CHEST - 2 VIEW COMPARISON:  04/14/2017 FINDINGS: The heart size and mediastinal contours are within normal limits. Both lungs are clear. Prior anterior cervical fusion. Bones are otherwise normal. IMPRESSION: Normal chest. Electronically Signed   By: Lorriane Shire M.D.   On: 09/21/2018 16:55   Dg Lumbar Spine 2-3 Views  Result Date: 09/28/2018 CLINICAL DATA:  Status post L3 through L5 PLIF EXAM: DG C-ARM 61-120 MIN; LUMBAR SPINE - 2-3 VIEW COMPARISON:  None. FINDINGS: Two 2 images from portable  C-arm radiography obtained in the operating room were submitted. These show posterior rods and pedicle screws at L3 through L5 with interbody fusion devices at L3-4 and L4-5. The alignment of the lumbar spine appears normal. No fracture or dislocation. IMPRESSION: 1. No complications status post L3-4 through L5 PLIF. Electronically Signed   By: Kerby Moors M.D.   On: 09/28/2018 17:01   Dg C-arm 1-60 Min  Result Date: 09/28/2018 CLINICAL DATA:  Status post L3 through L5 PLIF EXAM: DG C-ARM 61-120 MIN; LUMBAR SPINE - 2-3 VIEW COMPARISON:  None. FINDINGS: Two 2 images from portable C-arm radiography obtained in the operating room were submitted. These show posterior rods and pedicle screws at L3 through L5 with interbody fusion devices at L3-4 and L4-5. The alignment of the lumbar spine appears normal. No fracture or dislocation. IMPRESSION: 1. No complications status post L3-4 through L5 PLIF. Electronically Signed   By: Kerby Moors M.D.   On: 09/28/2018 17:01    Antibiotics:  Anti-infectives (From admission, onward)   Start     Dose/Rate Route Frequency Ordered Stop   09/29/18 0000  ceFAZolin (ANCEF) IVPB 2g/100 mL premix     2 g 200 mL/hr over 30 Minutes Intravenous Every 8 hours 09/28/18 1754 09/29/18 1559   09/28/18 1346  vancomycin (VANCOCIN) powder  Status:  Discontinued       As needed 09/28/18 1347 09/28/18 1555   09/28/18 1259  bacitracin 50,000 Units in sodium chloride 0.9 % 500 mL irrigation  Status:  Discontinued       As needed 09/28/18 1300 09/28/18 1555   09/28/18 0837  ceFAZolin (ANCEF) 2-4 GM/100ML-%  IVPB    Note to Pharmacy: Nyoka Cowden   : cabinet override      09/28/18 0837 09/28/18 1125   09/28/18 0830  ceFAZolin (ANCEF) IVPB 2g/100 mL premix     2 g 200 mL/hr over 30 Minutes Intravenous On call to O.R. 09/28/18 0827 09/28/18 1559      Discharge Exam: Blood pressure 114/65, pulse 87, temperature 98.9 F (37.2 C), temperature source Oral, resp. rate 18, SpO2  100 %. Neurologic: Grossly normal Ambulating and voiding well, incision cdi  Discharge Medications:   Allergies as of 09/29/2018      Reactions   Meloxicam Hives      Medication List    STOP taking these medications   oxyCODONE 5 MG immediate release tablet Commonly known as: Roxicodone     TAKE these medications   albuterol 108 (90 Base) MCG/ACT inhaler Commonly known as: VENTOLIN HFA Inhale 1-2 puffs into the lungs every 4 (four) hours as needed for wheezing or shortness of breath (or coughing).   IRON PO Take 1 tablet by mouth daily.   ONE-A-DAY WOMENS PO Take 1 tablet by mouth daily.   oxyCODONE-acetaminophen 10-325 MG tablet Commonly known as: Percocet Take 1 tablet by mouth every 6 (six) hours as needed for pain.   simvastatin 20 MG tablet Commonly known as: ZOCOR Take 20 mg by mouth daily.   tiZANidine 4 MG tablet Commonly known as: ZANAFLEX Take 4 mg by mouth every 6 (six) hours as needed for muscle spasms. What changed: Another medication with the same name was added. Make sure you understand how and when to take each.   tiZANidine 4 MG tablet Commonly known as: ZANAFLEX Take 1 tablet (4 mg total) by mouth every 6 (six) hours as needed for muscle spasms. What changed: You were already taking a medication with the same name, and this prescription was added. Make sure you understand how and when to take each.   vitamin B-12 1000 MCG tablet Commonly known as: CYANOCOBALAMIN Take 1,000 mcg by mouth daily.   vitamin C 500 MG tablet Commonly known as: ASCORBIC ACID Take 500 mg by mouth daily.   VITAMIN D PO Take 1 tablet by mouth daily.            Durable Medical Equipment  (From admission, onward)         Start     Ordered   09/28/18 1755  DME Walker rolling  Once    Question:  Patient needs a walker to treat with the following condition  Answer:  S/P lumbar fusion   09/28/18 1754   09/28/18 1755  DME 3 n 1  Once     09/28/18 1754           Disposition: home    Final Dx: plif L3-4, L4-5, right CTR  Discharge Instructions     Remove dressing in 72 hours   Complete by: As directed    Call MD for:  difficulty breathing, headache or visual disturbances   Complete by: As directed    Call MD for:  hives   Complete by: As directed    Call MD for:  persistant dizziness or light-headedness   Complete by: As directed    Call MD for:  persistant nausea and vomiting   Complete by: As directed    Call MD for:  redness, tenderness, or signs of infection (pain, swelling, redness, odor or green/yellow discharge around incision site)   Complete by: As directed  Call MD for:  severe uncontrolled pain   Complete by: As directed    Call MD for:  temperature >100.4   Complete by: As directed    Diet - low sodium heart healthy   Complete by: As directed    Driving Restrictions   Complete by: As directed    No driving for 2 weeks, no riding in the car for 1 week   Increase activity slowly   Complete by: As directed    Lifting restrictions   Complete by: As directed    No lifting more than 8 lbs         Signed: Ocie Cornfield Chavie Kolinski 09/29/2018, 8:56 AM

## 2018-09-29 NOTE — Evaluation (Signed)
Physical Therapy Evaluation Patient Details Name: Alisha Espinoza MRN: [id number] DOB: [birth year] Today's Date: 09/29/2018   History of Present Illness  The patient was admitted on 09/28/2018 and taken to the operating room where the patient underwent PLIF L3-4,, L4-5 and right CTR. PSH: 2 ACDF: 2013, 2014.  Clinical Impression  Patient is s/p above surgery resulting in the deficits listed below (see PT Problem List). Pt c/o 10/10 back pain. Pt requires verbal cues to adhere to back precautions. Patient will benefit from skilled PT to increase their independence and safety with mobility (while adhering to their precautions) to allow discharge to the venue listed below. Patient states her sister with stay with her for a week upon d/c home.     Follow Up Recommendations No PT follow up;Supervision/Assistance - 24 hour    Equipment Recommendations  Rolling walker with 5" wheels;3in1 (PT)    Recommendations for Other Services       Precautions / Restrictions Precautions Precautions: Back;Fall Precaution Booklet Issued: Yes (comment) Precaution Comments: pt able to verbally state but requires verbal cues to adhere functionally Required Braces or Orthoses: Spinal Brace Spinal Brace: Applied in standing position;Lumbar corset(pt unable to tolerate sitting due to radiating pain) Restrictions Weight Bearing Restrictions: No      Mobility  Bed Mobility Overal bed mobility: Needs Assistance Bed Mobility: Sidelying to Sit   Sidelying to sit: Supervision       General bed mobility comments: pt in L sidelying with pillow between knees upon PT arrival, pt used bed rail and was able to push self up into sitting  Transfers Overall transfer level: Needs assistance Equipment used: None Transfers: Sit to/from Stand Sit to Stand: Min guard         General transfer comment: min guard for safety as pt slow, guarded and mildly twisting to get up into standing, pt with minimal bending  however pt twisting to the Left  Ambulation/Gait Ambulation/Gait assistance: Supervision Gait Distance (Feet): 100 Feet Assistive device: Rolling walker (2 wheeled) Gait Pattern/deviations: Step-through pattern;Decreased stride length;Trunk flexed Gait velocity: dec Gait velocity interpretation: 1.31 - 2.62 ft/sec, indicative of limited community ambulator General Gait Details: educated pt on isometric contraction of abdominal muscles to support back and achieve more upright posture. walker adjusted to appropriate height for support. attempted to amb without RW however pt with extensive back extension and significant lateral sway  Stairs Stairs: Yes Stairs assistance: Min guard Stair Management: One rail Right;Step to pattern;Sideways Number of Stairs: 4(to mimic home set up) General stair comments: educated on ascending with good leg and descending with bad/weak leg  Wheelchair Mobility    Modified Rankin (Stroke Patients Only)       Balance Overall balance assessment: Needs assistance Sitting-balance support: Feet supported;No upper extremity supported Sitting balance-Leahy Scale: Good     Standing balance support: No upper extremity supported Standing balance-Leahy Scale: Fair Standing balance comment: able to statically stand however requires RW for safe ambulatoin                             Pertinent Vitals/Pain Pain Assessment: 0-10 Pain Score: 10-Worst pain ever Pain Location: back Pain Descriptors / Indicators: Constant;Sharp;Radiating Pain Intervention(s): Monitored during session    Home Living Family/patient expects to be discharged to:: Private residence Living Arrangements: Alone Available Help at Discharge: Family;Available PRN/intermittently(however states her sister will stay a week with her) Type of Home: House Home Access: Stairs to enter Entrance  Stairs-Rails: Psychiatric nurse of Steps: 4 Home Layout: One level Home  Equipment: None Additional Comments: pt reports she is going to sponge bathe and not get into tub    Prior Function Level of Independence: Independent               Hand Dominance   Dominant Hand: Right    Extremity/Trunk Assessment   Upper Extremity Assessment Upper Extremity Assessment: Overall WFL for tasks assessed    Lower Extremity Assessment Lower Extremity Assessment: Generalized weakness(due to pain from surgery)    Cervical / Trunk Assessment Cervical / Trunk Assessment: Other exceptions Cervical / Trunk Exceptions: back surgery  Communication   Communication: No difficulties  Cognition Arousal/Alertness: Awake/alert Behavior During Therapy: Anxious(regarding pain medicine) Overall Cognitive Status: Within Functional Limits for tasks assessed                                 General Comments: pt with decreased insight to precautions and is easily distracted however suspect this is to be baseline      General Comments General comments (skin integrity, edema, etc.): VSS    Exercises     Assessment/Plan    PT Assessment Patient needs continued PT services  PT Problem List Decreased strength;Decreased range of motion;Decreased activity tolerance;Decreased balance;Decreased mobility;Decreased knowledge of use of DME;Decreased safety awareness;Pain       PT Treatment Interventions DME instruction;Gait training;Stair training;Functional mobility training;Therapeutic activities;Therapeutic exercise;Balance training;Neuromuscular re-education    PT Goals (Current goals can be found in the Care Plan section)  Acute Rehab PT Goals Patient Stated Goal: stop the pain PT Goal Formulation: With patient Time For Goal Achievement: 10/13/18 Potential to Achieve Goals: Good    Frequency Min 5X/week   Barriers to discharge Decreased caregiver support pt states her sister will stay with her    Co-evaluation               AM-PAC PT "6  Clicks" Mobility  Outcome Measure Help needed turning from your back to your side while in a flat bed without using bedrails?: A Little Help needed moving from lying on your back to sitting on the side of a flat bed without using bedrails?: A Little Help needed moving to and from a bed to a chair (including a wheelchair)?: A Little Help needed standing up from a chair using your arms (e.g., wheelchair or bedside chair)?: A Little Help needed to walk in hospital room?: A Little Help needed climbing 3-5 steps with a railing? : A Little 6 Click Score: 18    End of Session Equipment Utilized During Treatment: Back brace Activity Tolerance: Patient tolerated treatment well Patient left: in chair;with call bell/phone within reach Nurse Communication: Mobility status PT Visit Diagnosis: Unsteadiness on feet (R26.81);Difficulty in walking, not elsewhere classified (R26.2);Pain Pain - Right/Left: Right Pain - part of body: Leg(and back)    Time: 0811-0828 PT Time Calculation (min) (ACUTE ONLY): 17 min   Charges:   PT Evaluation $PT Eval Moderate Complexity: 1 Mod          Kittie Plater, PT, DPT Acute Rehabilitation Services Pager #: [phone] Office #: [phone]   Berline Lopes 09/29/2018, 9:23 AM

## 2018-10-01 ENCOUNTER — Encounter (HOSPITAL_COMMUNITY): Payer: Self-pay

## 2018-10-01 ENCOUNTER — Other Ambulatory Visit: Payer: Self-pay

## 2018-10-01 ENCOUNTER — Emergency Department (HOSPITAL_COMMUNITY)
Admission: EM | Admit: 2018-10-01 | Discharge: 2018-10-01 | Disposition: A | Discharge status: Home / Self Care | Payer: BC Managed Care – PPO | Attending: Emergency Medicine | Admitting: Emergency Medicine

## 2018-10-01 DIAGNOSIS — Z5321 Procedure and treatment not carried out due to patient leaving prior to being seen by health care provider: Secondary | ICD-10-CM | POA: Diagnosis not present

## 2018-10-01 DIAGNOSIS — M79605 Pain in left leg: Secondary | ICD-10-CM | POA: Diagnosis not present

## 2018-10-01 MED FILL — Sodium Chloride IV Soln 0.9%: INTRAVENOUS | Qty: 1000 | Status: AC

## 2018-10-01 MED FILL — Heparin Sodium (Porcine) Inj 1000 Unit/ML: INTRAMUSCULAR | Qty: 30 | Status: AC

## 2018-10-01 NOTE — ED Notes (Addendum)
PT Left and said they will no longer wait for a room. Tried to ask if they would like to speak to charge..the patient did not acknowledge this tech.

## 2018-10-01 NOTE — ED Triage Notes (Signed)
Pt states that she recently has fusion Monday 6/29; L3-5; pt states that she was prescribed Oxy and muscle relaxer but not helping; pt states that she last took Oxy at 7p. Pt states that pain is in L leg and it is becoming numb.

## 2018-10-01 NOTE — ED Notes (Signed)
Charge RN attempted to call pt without success.

## 2018-10-02 ENCOUNTER — Encounter (HOSPITAL_COMMUNITY): Payer: Self-pay

## 2018-10-02 ENCOUNTER — Emergency Department (HOSPITAL_COMMUNITY)
Admission: EM | Admit: 2018-10-02 | Discharge: 2018-10-02 | Disposition: A | Discharge status: Home / Self Care | Payer: BC Managed Care – PPO | Attending: Emergency Medicine | Admitting: Emergency Medicine

## 2018-10-02 ENCOUNTER — Other Ambulatory Visit: Payer: Self-pay

## 2018-10-02 DIAGNOSIS — G8918 Other acute postprocedural pain: Secondary | ICD-10-CM | POA: Insufficient documentation

## 2018-10-02 DIAGNOSIS — F17210 Nicotine dependence, cigarettes, uncomplicated: Secondary | ICD-10-CM | POA: Insufficient documentation

## 2018-10-02 DIAGNOSIS — M79652 Pain in left thigh: Secondary | ICD-10-CM | POA: Diagnosis not present

## 2018-10-02 MED ORDER — METHYLPREDNISOLONE 4 MG PO TBPK: ORAL_TABLET | ORAL | 0 refills | Status: DC

## 2018-10-02 MED ORDER — HYDROCODONE-ACETAMINOPHEN 5-325 MG PO TABS
1.0000 | ORAL_TABLET | Freq: Once | ORAL | Status: AC
  Administered 2018-10-02: 1 via ORAL
  Filled 2018-10-02: qty 1

## 2018-10-02 MED ORDER — DIAZEPAM 2 MG PO TABS: 2.0000 mg | ORAL_TABLET | Freq: Three times a day (TID) | ORAL | 0 refills | Status: DC | PRN

## 2018-10-02 MED ORDER — DIAZEPAM 2 MG PO TABS
2.0000 mg | ORAL_TABLET | Freq: Once | ORAL | Status: AC
  Administered 2018-10-02: 2 mg via ORAL
  Filled 2018-10-02: qty 1

## 2018-10-02 MED ORDER — HYDROCODONE-ACETAMINOPHEN 5-325 MG PO TABS: 1.0000 | ORAL_TABLET | Freq: Four times a day (QID) | ORAL | 0 refills | Status: DC | PRN

## 2018-10-02 NOTE — Discharge Instructions (Addendum)
Call neurosurgeon for follow-up on Monday.  Please return to ED if you develop any weakness in the lower extremities, any urinary retention, fever, worsening pain

## 2018-10-02 NOTE — ED Triage Notes (Signed)
Patient states she had L3-5 fusion on 09/28/18. Patient reports that she has been having left leg pain and numbness. Patient states her pain meds prescribed are not helping. Patient went to [facility] ED yesterday, but left due to long wait.

## 2018-10-02 NOTE — ED Provider Notes (Signed)
[facility] DEPT Provider Note   CSN: [id number] Arrival date & time: 10/02/18  1306    History   Chief Complaint Chief Complaint  Patient presents with  . Leg Pain    HPI JENICE LEINER is a 53 y.o. female.     The history is provided by the patient.  Back Pain Location:  Lumbar spine Quality:  Aching Radiates to:  L thigh Pain severity:  Mild Onset quality:  Gradual Timing:  Constant Progression:  Unchanged Chronicity:  New Context comment:  Recent lumbar fusion, pain and tightness from back to left thigh, numbness to that area. No issues going to the bathroom, using norco and muscle relaxant.  Relieved by:  Narcotics Worsened by:  Movement Associated symptoms: leg pain and numbness   Associated symptoms: no abdominal pain, no abdominal swelling, no bladder incontinence, no bowel incontinence, no chest pain, no dysuria, no fever, no headaches, no pelvic pain, no perianal numbness, no tingling, no weakness and no weight loss   Risk factors: recent surgery     Past Medical History:  Diagnosis Date  . Bronchitis   . Dyspnea   . Heart murmur    resolved "closed by age 11."   . Hyperlipidemia   . Seasonal allergies     Patient Active Problem List   Diagnosis Date Noted  . 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 Acute bronchitis 08/08/2015  . Leukocytosis 08/08/2015  . Hyperglycemia 08/08/2015  . Tobacco abuse 08/08/2015  . 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 BACTERIAL PNEUMONIA, RIGHT LOWER LOBE 04/27/2008  . LUMP OR MASS IN BREAST 04/27/2008  . COUGH 04/27/2008    Past Surgical History:  Procedure Laterality Date  . ABDOMINAL HYSTERECTOMY  2000  . ANTERIOR CERVICAL DECOMP/DISCECTOMY FUSION  03/19/2012   Procedure: ANTERIOR CERVICAL DECOMPRESSION/DISCECTOMY FUSION 1 LEVEL;  Surgeon: Melina Schools, MD;  Location: [facility];  Service: Orthopedics;  Laterality: Left;  Total Disc Replacement C4-5  . ANTERIOR CERVICAL  DECOMP/DISCECTOMY FUSION N/A 05/07/2012   Procedure: ANTERIOR CERVICAL DECOMPRESSION/DISCECTOMY FUSION 1 LEVEL/HARDWARE REMOVAL;  Surgeon: Melina Schools, MD;  Location: [facility];  Service: Orthopedics;  Laterality: N/A;  REMOVAL OF CERVICAL DISC REPLACEMENT AND ACDF C4-5  . BACK SURGERY    . CARPAL TUNNEL RELEASE Right 09/28/2018   Procedure: CARPAL TUNNEL RELEASE;  Surgeon: Eustace Moore, MD;  Location: [facility];  Service: Neurosurgery;  Laterality: Right;  CARPAL TUNNEL RELEASE  . CERVICAL FUSION  05/07/2012   Dr Rolena Infante  . COLONOSCOPY    . CYSTECTOMY     L wrist  . POLYPECTOMY    . ROTATOR CUFF REPAIR     Right  . TYMPANOSTOMY TUBE PLACEMENT       OB History   No obstetric history on file.      Home Medications    Prior to Admission medications   Medication Sig Start Date End Date Taking? Authorizing Provider  albuterol (PROVENTIL HFA;VENTOLIN HFA) 108 (90 Base) MCG/ACT inhaler Inhale 1-2 puffs into the lungs every 4 (four) hours as needed for wheezing or shortness of breath (or coughing). 7/82/42   Delora Fuel, MD  diazepam (VALIUM) 2 MG tablet Take 1 tablet (2 mg total) by mouth every 8 (eight) hours as needed for up to 12 doses for muscle spasms. 10/02/18   Atsushi Yom, DO  Ferrous Sulfate (IRON PO) Take 1 tablet by mouth daily.     [provider]  HYDROcodone-acetaminophen (NORCO/VICODIN) 5-325 MG tablet Take 1 tablet  by mouth every 6 (six) hours as needed for up to 12 doses. 10/02/18   Jackye Dever, DO  methylPREDNISolone (MEDROL DOSEPAK) 4 MG TBPK tablet Follow package insert 10/02/18   Kaizlee Carlino, DO  Multiple Vitamins-Calcium (ONE-A-DAY WOMENS PO) Take 1 tablet by mouth daily.     [provider]  simvastatin (ZOCOR) 20 MG tablet Take 20 mg by mouth daily. 01/26/18   [provider]  vitamin B-12 (CYANOCOBALAMIN) 1000 MCG tablet Take 1,000 mcg by mouth daily.    [provider]  vitamin C (ASCORBIC ACID) 500 MG tablet Take 500 mg by mouth  daily.    [provider]  VITAMIN D PO Take 1 tablet by mouth daily.     [provider]    Family History Family History  Problem Relation Age of Onset  . Diabetes Mother   . Hypertension Father   . Pancreatic cancer Sister   . Colon cancer Neg Hx   . Rectal cancer Neg Hx   . Stomach cancer Neg Hx   . Colon polyps Neg Hx   . Esophageal cancer Neg Hx     Social History Social History   Tobacco Use  . Smoking status: Current Every Day Smoker    Packs/day: 0.50    Years: 30.00    Pack years: 15.00    Types: Cigarettes  . Smokeless tobacco: Never Used  Substance Use Topics  . Alcohol use: Yes    Comment: occasional  . Drug use: No     Allergies   Meloxicam   Review of Systems Review of Systems  Constitutional: Negative for chills, fever and weight loss.  HENT: Negative for ear pain and sore throat.   Eyes: Negative for pain and visual disturbance.  Respiratory: Negative for cough and shortness of breath.   Cardiovascular: Negative for chest pain and palpitations.  Gastrointestinal: Negative for abdominal pain, bowel incontinence and vomiting.  Genitourinary: Negative for bladder incontinence, dysuria, hematuria and pelvic pain.  Musculoskeletal: Positive for back pain. Negative for arthralgias.  Skin: Negative for color change and rash.  Neurological: Positive for numbness. Negative for dizziness, tingling, seizures, syncope, facial asymmetry, weakness, light-headedness and headaches.  All other systems reviewed and are negative.    Physical Exam Updated Vital Signs  ED Triage Vitals  Enc Vitals Group     BP 10/02/18 1318 120/70     Pulse Rate 10/02/18 1318 95     Resp 10/02/18 1318 18     Temp 10/02/18 1318 98.9 F (37.2 C)     Temp Source 10/02/18 1318 Oral     SpO2 10/02/18 1318 90 %     Weight 10/02/18 1321 203 lb 11.3 oz (92.4 kg)     Height 10/02/18 1321 5' 6.5" (1.689 m)     Head Circumference --      Peak Flow --      Pain  Score 10/02/18 1320 9     Pain Loc --      Pain Edu? --      Excl. in [place]? --     Physical Exam Vitals signs and nursing note reviewed.  Constitutional:      General: She is not in acute distress.    Appearance: She is well-developed. She is not ill-appearing.  HENT:     Head: Normocephalic and atraumatic.     Nose: Nose normal.     Mouth/Throat:     Mouth: Mucous membranes are moist.  Eyes:  Extraocular Movements: Extraocular movements intact.     Conjunctiva/sclera: Conjunctivae normal.     Pupils: Pupils are equal, round, and reactive to light.  Neck:     Musculoskeletal: Normal range of motion and neck supple. No muscular tenderness.  Cardiovascular:     Rate and Rhythm: Normal rate and regular rhythm.     Heart sounds: No murmur.  Pulmonary:     Effort: Pulmonary effort is normal. No respiratory distress.     Breath sounds: Normal breath sounds.  Abdominal:     Palpations: Abdomen is soft.     Tenderness: There is no abdominal tenderness.  Musculoskeletal:        General: Tenderness (TTP to left thigh, left lower back) present.  Skin:    General: Skin is warm and dry.  Neurological:     General: No focal deficit present.     Mental Status: She is alert and oriented to person, place, and time.     Motor: No weakness.     Comments: Patient states some decrease sensation to the outer part of the left leg, appears a 5+ out of 5 strength throughout, is able to ambulate, normal sensation otherwise      ED Treatments / Results  Labs (all labs ordered are listed, but only abnormal results are displayed) Labs Reviewed - No data to display  EKG None  Radiology No results found.  Procedures Procedures (including critical care time)  Medications Ordered in ED Medications  HYDROcodone-acetaminophen (NORCO/VICODIN) 5-325 MG per tablet 1 tablet (has no administration in time range)  diazepam (VALIUM) tablet 2 mg (has no administration in time range)      Initial Impression / Assessment and Plan / ED Course  I have reviewed the triage vital signs and the nursing notes.  Pertinent labs & imaging results that were available during my care of the patient were reviewed by me and considered in my medical decision making (see chart for details).     MCKYNZI CAMMON is a 53 year old female with recent lumbar fusion who presents to the ED with ongoing low back pain, left thigh pain.  Patient denies any saddle anesthesia, loss of bowel or bladder.  No urinary retention.  States that her pain medications have helped some.  She has taken all of her narcotic pain medicine already.  She is on a muscle relaxant.  She has some lateral left thigh numbness and spasm as well as spasm to her low back.  Surgical site appears clean dry and intact.  Patient is ambulatory in the room.  She has good strength in the lower extremities.  Overall appears that her pain is mostly postsurgical.  Does not appear to have any new concerning features on exam.  Talked with Primus Bravo and recommend that patient get started on some Valium and Solu-Medrol Dosepak.  Will prescribe narcotic however she may not be able to get this filled until her current prescription is expired.  They will flag her for closer follow-up next week.  She is given strict return precautions if she developed any worsening pain, weakness, urinary retention.  However at this time exam is overall reassuring.  Suspect mostly muscle spasm type pain.  Does not appear to have any cause for cord compression.  Patient understands return precautions.  She was given Norco and Valium in the ED.  Discharged in good condition.  This chart was dictated using voice recognition software.  Despite best efforts to proofread,  errors can occur which  can change the documentation meaning.    Final Clinical Impressions(s) / ED Diagnoses   Final diagnoses:  Post-operative pain    ED Discharge Orders         Ordered     HYDROcodone-acetaminophen (NORCO/VICODIN) 5-325 MG tablet  Every 6 hours PRN     10/02/18 1539    diazepam (VALIUM) 2 MG tablet  Every 8 hours PRN     10/02/18 1539    methylPREDNISolone (MEDROL DOSEPAK) 4 MG TBPK tablet     10/02/18 1539           Terica Yogi, DO 10/02/18 1540

## 2018-10-02 NOTE — ED Notes (Signed)
Bed: [facility] Expected date:  Expected time:  Means of arrival:  Comments: 

## 2018-10-07 ENCOUNTER — Encounter (HOSPITAL_COMMUNITY): Payer: Self-pay

## 2018-10-07 ENCOUNTER — Other Ambulatory Visit: Payer: Self-pay

## 2018-10-07 DIAGNOSIS — M79605 Pain in left leg: Secondary | ICD-10-CM | POA: Diagnosis not present

## 2018-10-07 DIAGNOSIS — M545 Low back pain: Secondary | ICD-10-CM | POA: Diagnosis not present

## 2018-10-07 DIAGNOSIS — F17210 Nicotine dependence, cigarettes, uncomplicated: Secondary | ICD-10-CM | POA: Insufficient documentation

## 2018-10-07 DIAGNOSIS — M5489 Other dorsalgia: Secondary | ICD-10-CM | POA: Diagnosis not present

## 2018-10-07 DIAGNOSIS — I10 Essential (primary) hypertension: Secondary | ICD-10-CM | POA: Diagnosis not present

## 2018-10-07 DIAGNOSIS — M549 Dorsalgia, unspecified: Secondary | ICD-10-CM | POA: Diagnosis not present

## 2018-10-07 DIAGNOSIS — R52 Pain, unspecified: Secondary | ICD-10-CM | POA: Diagnosis not present

## 2018-10-07 NOTE — ED Triage Notes (Addendum)
Pt from home. Pt had back surgery on 6/29. Pt is here today for pain control for left leg and back. Pt was seen here on 7/3 for same.

## 2018-10-08 ENCOUNTER — Other Ambulatory Visit: Payer: Self-pay

## 2018-10-08 ENCOUNTER — Emergency Department (HOSPITAL_COMMUNITY)
Admission: EM | Admit: 2018-10-08 | Discharge: 2018-10-08 | Disposition: A | Discharge status: Home / Self Care | Payer: BC Managed Care – PPO | Attending: Emergency Medicine | Admitting: Emergency Medicine

## 2018-10-08 DIAGNOSIS — M79605 Pain in left leg: Secondary | ICD-10-CM

## 2018-10-08 MED ORDER — DIAZEPAM 5 MG PO TABS
5.0000 mg | ORAL_TABLET | Freq: Once | ORAL | Status: AC
  Administered 2018-10-08: 03:00:00 5 mg via ORAL
  Filled 2018-10-08: qty 1

## 2018-10-08 MED ORDER — IBUPROFEN 800 MG PO TABS: 800.0000 mg | ORAL_TABLET | Freq: Three times a day (TID) | ORAL | 0 refills | Status: DC | PRN

## 2018-10-08 MED ORDER — OXYCODONE-ACETAMINOPHEN 5-325 MG PO TABS
2.0000 | ORAL_TABLET | Freq: Once | ORAL | Status: AC
  Administered 2018-10-08: 03:00:00 2 via ORAL
  Filled 2018-10-08: qty 2

## 2018-10-08 MED ORDER — DEXAMETHASONE SODIUM PHOSPHATE 10 MG/ML IJ SOLN
10.0000 mg | Freq: Once | INTRAMUSCULAR | Status: AC
  Administered 2018-10-08: 10 mg via INTRAMUSCULAR
  Filled 2018-10-08: qty 1

## 2018-10-08 NOTE — ED Notes (Signed)
Patient ambulatory to restroom with walker. Patient states that she has been uses walker since surgery on June 29th.

## 2018-10-08 NOTE — ED Notes (Signed)
Patient refused to sign discharge, stating "I ain't signing that, ya'll didn't do a damn thing for me." Discharge instructions reviewed with patient. Patient taken outside in a wheelchair to meet ride.

## 2018-10-08 NOTE — ED Notes (Signed)
Patient ambulatory to restroom with walker.  

## 2018-10-08 NOTE — Discharge Instructions (Addendum)
We recommend follow-up with your neurosurgeon and/your primary care doctor regarding your ongoing pain, especially to obtain additional prescriptions for pain control if indicated.  Continue use of 600 mg ibuprofen every 6 hours.  You may alternate this with Tylenol, as needed.  You may return to the ED for new or concerning symptoms.

## 2018-10-08 NOTE — ED Provider Notes (Signed)
[facility] DEPT Provider Note   CSN: [id number] Arrival date & time: 10/07/18  2235    History   Chief Complaint Chief Complaint  Patient presents with  . Back Pain  . Leg Pain    HPI Alisha Espinoza is a 53 y.o. female.     53 y/o female with a history of dyslipidemia status post lumbar fusion on 09/28/2018 by Dr. Ronnald Ramp presents to the emergency department for persistent pain in her left leg.  She states that the pain is primarily to her inner left thigh.  It is constant and unchanged.  Usually relieved with narcotic pain medicine, but patient ran out of this a few days ago.  She has been taking prednisone and muscle relaxers as previously prescribed to her.  Was supposed to see Dr. Ronnald Ramp on 10/06/2018 for follow-up, but he was out of town and unable to keep this appointment (per patient). Now scheduled for follow up on the 14th.  No fevers, drainage from her incision site, redness to her incision site, extremity numbness or paresthesias, extremity weakness, bowel or bladder incontinence, genital or perianal numbness.  The history is provided by the patient. No language interpreter was used.  Back Pain Associated symptoms: leg pain   Leg Pain Associated symptoms: back pain     Past Medical History:  Diagnosis Date  . Bronchitis   . Dyspnea   . Heart murmur    resolved "closed by age 51."   . Hyperlipidemia   . Seasonal allergies     Patient Active Problem List   Diagnosis Date Noted  . 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 Acute bronchitis 08/08/2015  . Leukocytosis 08/08/2015  . Hyperglycemia 08/08/2015  . Tobacco abuse 08/08/2015  . 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 BACTERIAL PNEUMONIA, RIGHT LOWER LOBE 04/27/2008  . LUMP OR MASS IN BREAST 04/27/2008  . COUGH 04/27/2008    Past Surgical History:  Procedure Laterality Date  . ABDOMINAL HYSTERECTOMY  2000  . ANTERIOR CERVICAL DECOMP/DISCECTOMY FUSION  03/19/2012   Procedure: ANTERIOR CERVICAL DECOMPRESSION/DISCECTOMY FUSION 1 LEVEL;  Surgeon: Melina Schools, MD;  Location: [facility];  Service: Orthopedics;  Laterality: Left;  Total Disc Replacement C4-5  . ANTERIOR CERVICAL DECOMP/DISCECTOMY FUSION N/A 05/07/2012   Procedure: ANTERIOR CERVICAL DECOMPRESSION/DISCECTOMY FUSION 1 LEVEL/HARDWARE REMOVAL;  Surgeon: Melina Schools, MD;  Location: [facility];  Service: Orthopedics;  Laterality: N/A;  REMOVAL OF CERVICAL DISC REPLACEMENT AND ACDF C4-5  . BACK SURGERY    . CARPAL TUNNEL RELEASE Right 09/28/2018   Procedure: CARPAL TUNNEL RELEASE;  Surgeon: Eustace Moore, MD;  Location: [facility];  Service: Neurosurgery;  Laterality: Right;  CARPAL TUNNEL RELEASE  . CERVICAL FUSION  05/07/2012   Dr Rolena Infante  . COLONOSCOPY    . CYSTECTOMY     L wrist  . POLYPECTOMY    . ROTATOR CUFF REPAIR     Right  . TYMPANOSTOMY TUBE PLACEMENT       OB History   No obstetric history on file.      Home Medications    Prior to Admission medications   Medication Sig Start Date End Date Taking? Authorizing Provider  albuterol (PROVENTIL HFA;VENTOLIN HFA) 108 (90 Base) MCG/ACT inhaler Inhale 1-2 puffs into the lungs every 4 (four) hours as needed for wheezing or shortness of breath (or coughing). 1/32/44  Yes Delora Fuel, MD  diazepam (VALIUM) 2 MG tablet Take 1 tablet (2 mg total) by mouth every 8 (eight) hours as  needed for up to 12 doses for muscle spasms. 10/02/18  Yes Curatolo, Adam, DO  HYDROcodone-acetaminophen (NORCO/VICODIN) 5-325 MG tablet Take 1 tablet by mouth every 6 (six) hours as needed for up to 12 doses. 10/02/18  Yes Curatolo, Adam, DO  simvastatin (ZOCOR) 20 MG tablet Take 20 mg by mouth daily. 01/26/18  Yes [provider]  methylPREDNISolone (MEDROL DOSEPAK) 4 MG TBPK tablet Follow package insert Patient not taking: Reported on 10/08/2018 10/02/18   Lennice Sites, DO    Family History Family History  Problem Relation Age of Onset  . Diabetes Mother   .  Hypertension Father   . Pancreatic cancer Sister   . Colon cancer Neg Hx   . Rectal cancer Neg Hx   . Stomach cancer Neg Hx   . Colon polyps Neg Hx   . Esophageal cancer Neg Hx     Social History Social History   Tobacco Use  . Smoking status: Current Every Day Smoker    Packs/day: 0.50    Years: 30.00    Pack years: 15.00    Types: Cigarettes  . Smokeless tobacco: Never Used  Substance Use Topics  . Alcohol use: Yes    Comment: occasional  . Drug use: No     Allergies   Meloxicam   Review of Systems Review of Systems  Musculoskeletal: Positive for back pain.  Ten systems reviewed and are negative for acute change, except as noted in the HPI.    Physical Exam Updated Vital Signs BP (!) 156/105 (BP Location: Left Arm)   Pulse 85   Temp 98.9 F (37.2 C) (Oral)   Resp 20   Ht 5' 6.5" (1.689 m)   Wt 92.4 kg   SpO2 99%   BMI 32.39 kg/m   Physical Exam Vitals signs and nursing note reviewed.  Constitutional:      General: She is not in acute distress.    Appearance: She is well-developed. She is not diaphoretic.     Comments: Initially resting comfortably. By the time I leave the room, fidgeting in the bed, wailing, and tearful.  HENT:     Head: Normocephalic and atraumatic.  Eyes:     General: No scleral icterus.    Conjunctiva/sclera: Conjunctivae normal.  Neck:     Musculoskeletal: Normal range of motion.  Cardiovascular:     Rate and Rhythm: Normal rate and regular rhythm.     Pulses: Normal pulses.  Pulmonary:     Effort: Pulmonary effort is normal. No respiratory distress.     Comments: Respirations even and unlabored Musculoskeletal: Normal range of motion.     Comments: Surgical incision site appears appropriate. C/D/I.   Skin:    General: Skin is warm and dry.     Coloration: Skin is not pale.     Findings: No erythema or rash.  Neurological:     Mental Status: She is alert and oriented to person, place, and time.     Coordination:  Coordination normal.     Comments: Moving all extremities spontaneously.  Ambulatory to the restroom with a walker while in the ED.  Psychiatric:        Behavior: Behavior normal.      ED Treatments / Results  Labs (all labs ordered are listed, but only abnormal results are displayed) Labs Reviewed - No data to display  EKG None  Radiology No results found.  Procedures Procedures (including critical care time)  Medications Ordered in ED Medications  oxyCODONE-acetaminophen (PERCOCET/ROXICET)  5-325 MG per tablet 2 tablet (2 tablets Oral Given 10/08/18 0303)  diazepam (VALIUM) tablet 5 mg (5 mg Oral Given 10/08/18 0303)  dexamethasone (DECADRON) injection 10 mg (10 mg Intramuscular Given 10/08/18 0303)     Initial Impression / Assessment and Plan / ED Course  I have reviewed the triage vital signs and the nursing notes.  Pertinent labs & imaging results that were available during my care of the patient were reviewed by me and considered in my medical decision making (see chart for details).        53 year old female presents to the emergency department for persistent left leg and thigh pain since surgery on September 28, 2018 by Dr. Ronnald Ramp.  She underwent lumbar fusion on this date.  Remains neurovascularly intact.  Able to ambulate with a walker.  Surgical site is clean, dry, intact.  She has no red flags or signs concerning for cauda equina.  Afebrile with stable vital signs.  Patient has been prescribed 92 tablets of narcotics in the past 2 weeks and has run out of these prescriptions.  Given 2 tablets of Percocet with Valium and Decadron in the ED.  I have explained to the patient that we are unable to refill any additional prescriptions for narcotics at this time, but have encouraged her to follow-up with her neurosurgeon as well as her primary care doctor.  Discharged from the ED in stable condition.   Final Clinical Impressions(s) / ED Diagnoses   Final diagnoses:  Left leg pain     ED Discharge Orders    None       Antonietta Breach, PA-C 10/08/18 0403    Fatima Blank, MD 10/11/18 [phone]

## 2018-11-03 DIAGNOSIS — M25631 Stiffness of right wrist, not elsewhere classified: Secondary | ICD-10-CM | POA: Diagnosis not present

## 2018-11-03 DIAGNOSIS — M25641 Stiffness of right hand, not elsewhere classified: Secondary | ICD-10-CM | POA: Diagnosis not present

## 2018-11-03 DIAGNOSIS — M6281 Muscle weakness (generalized): Secondary | ICD-10-CM | POA: Diagnosis not present

## 2018-11-03 DIAGNOSIS — M79641 Pain in right hand: Secondary | ICD-10-CM | POA: Diagnosis not present

## 2018-11-05 DIAGNOSIS — M79641 Pain in right hand: Secondary | ICD-10-CM | POA: Diagnosis not present

## 2018-11-05 DIAGNOSIS — M6281 Muscle weakness (generalized): Secondary | ICD-10-CM | POA: Diagnosis not present

## 2018-11-05 DIAGNOSIS — M25641 Stiffness of right hand, not elsewhere classified: Secondary | ICD-10-CM | POA: Diagnosis not present

## 2018-11-05 DIAGNOSIS — M25631 Stiffness of right wrist, not elsewhere classified: Secondary | ICD-10-CM | POA: Diagnosis not present

## 2018-11-10 DIAGNOSIS — M6281 Muscle weakness (generalized): Secondary | ICD-10-CM | POA: Diagnosis not present

## 2018-11-10 DIAGNOSIS — M79641 Pain in right hand: Secondary | ICD-10-CM | POA: Diagnosis not present

## 2018-11-10 DIAGNOSIS — M25641 Stiffness of right hand, not elsewhere classified: Secondary | ICD-10-CM | POA: Diagnosis not present

## 2018-11-10 DIAGNOSIS — M5416 Radiculopathy, lumbar region: Secondary | ICD-10-CM | POA: Diagnosis not present

## 2018-11-10 DIAGNOSIS — M25631 Stiffness of right wrist, not elsewhere classified: Secondary | ICD-10-CM | POA: Diagnosis not present

## 2018-11-12 DIAGNOSIS — M79641 Pain in right hand: Secondary | ICD-10-CM | POA: Diagnosis not present

## 2018-11-12 DIAGNOSIS — M25631 Stiffness of right wrist, not elsewhere classified: Secondary | ICD-10-CM | POA: Diagnosis not present

## 2018-11-12 DIAGNOSIS — M25641 Stiffness of right hand, not elsewhere classified: Secondary | ICD-10-CM | POA: Diagnosis not present

## 2018-11-12 DIAGNOSIS — M6281 Muscle weakness (generalized): Secondary | ICD-10-CM | POA: Diagnosis not present

## 2018-11-17 DIAGNOSIS — M79641 Pain in right hand: Secondary | ICD-10-CM | POA: Diagnosis not present

## 2018-11-17 DIAGNOSIS — M6281 Muscle weakness (generalized): Secondary | ICD-10-CM | POA: Diagnosis not present

## 2018-11-17 DIAGNOSIS — M25631 Stiffness of right wrist, not elsewhere classified: Secondary | ICD-10-CM | POA: Diagnosis not present

## 2018-11-17 DIAGNOSIS — M25641 Stiffness of right hand, not elsewhere classified: Secondary | ICD-10-CM | POA: Diagnosis not present

## 2018-11-20 ENCOUNTER — Ambulatory Visit
Admission: RE | Admit: 2018-11-20 | Discharge: 2018-11-20 | Disposition: A | Discharge status: Home / Self Care | Payer: BC Managed Care – PPO | Source: Ambulatory Visit | Attending: Family Medicine | Admitting: Family Medicine

## 2018-11-20 ENCOUNTER — Other Ambulatory Visit: Payer: Self-pay

## 2018-11-20 DIAGNOSIS — N6310 Unspecified lump in the right breast, unspecified quadrant: Secondary | ICD-10-CM

## 2018-11-20 DIAGNOSIS — N6312 Unspecified lump in the right breast, upper inner quadrant: Secondary | ICD-10-CM | POA: Diagnosis not present

## 2018-11-20 DIAGNOSIS — N6314 Unspecified lump in the right breast, lower inner quadrant: Secondary | ICD-10-CM | POA: Diagnosis not present

## 2018-12-13 DIAGNOSIS — R030 Elevated blood-pressure reading, without diagnosis of hypertension: Secondary | ICD-10-CM | POA: Diagnosis not present

## 2018-12-13 DIAGNOSIS — J029 Acute pharyngitis, unspecified: Secondary | ICD-10-CM | POA: Diagnosis not present

## 2018-12-13 DIAGNOSIS — H6593 Unspecified nonsuppurative otitis media, bilateral: Secondary | ICD-10-CM | POA: Diagnosis not present

## 2018-12-13 DIAGNOSIS — R05 Cough: Secondary | ICD-10-CM | POA: Diagnosis not present

## 2018-12-13 DIAGNOSIS — H9202 Otalgia, left ear: Secondary | ICD-10-CM | POA: Diagnosis not present

## 2018-12-13 DIAGNOSIS — H6592 Unspecified nonsuppurative otitis media, left ear: Secondary | ICD-10-CM | POA: Diagnosis not present

## 2018-12-14 DIAGNOSIS — M65331 Trigger finger, right middle finger: Secondary | ICD-10-CM | POA: Diagnosis not present

## 2018-12-14 DIAGNOSIS — M65342 Trigger finger, left ring finger: Secondary | ICD-10-CM | POA: Diagnosis not present

## 2018-12-14 DIAGNOSIS — M65311 Trigger thumb, right thumb: Secondary | ICD-10-CM | POA: Diagnosis not present

## 2018-12-14 DIAGNOSIS — M65341 Trigger finger, right ring finger: Secondary | ICD-10-CM | POA: Diagnosis not present

## 2018-12-14 DIAGNOSIS — M65332 Trigger finger, left middle finger: Secondary | ICD-10-CM | POA: Diagnosis not present

## 2018-12-21 DIAGNOSIS — M130 Polyarthritis, unspecified: Secondary | ICD-10-CM | POA: Diagnosis not present

## 2018-12-21 DIAGNOSIS — J410 Simple chronic bronchitis: Secondary | ICD-10-CM | POA: Diagnosis not present

## 2018-12-21 DIAGNOSIS — Z833 Family history of diabetes mellitus: Secondary | ICD-10-CM | POA: Diagnosis not present

## 2018-12-21 DIAGNOSIS — R635 Abnormal weight gain: Secondary | ICD-10-CM | POA: Diagnosis not present

## 2018-12-21 DIAGNOSIS — L700 Acne vulgaris: Secondary | ICD-10-CM | POA: Diagnosis not present

## 2018-12-21 DIAGNOSIS — G4700 Insomnia, unspecified: Secondary | ICD-10-CM | POA: Diagnosis not present

## 2018-12-21 DIAGNOSIS — Z720 Tobacco use: Secondary | ICD-10-CM | POA: Diagnosis not present

## 2018-12-23 ENCOUNTER — Other Ambulatory Visit: Payer: Self-pay

## 2018-12-23 DIAGNOSIS — Z20822 Contact with and (suspected) exposure to covid-19: Secondary | ICD-10-CM

## 2018-12-25 LAB — NOVEL CORONAVIRUS, NAA: SARS-CoV-2, NAA: NOT DETECTED

## 2019-01-04 DIAGNOSIS — M130 Polyarthritis, unspecified: Secondary | ICD-10-CM | POA: Diagnosis not present

## 2019-01-04 DIAGNOSIS — G4700 Insomnia, unspecified: Secondary | ICD-10-CM | POA: Diagnosis not present

## 2019-01-04 DIAGNOSIS — E1169 Type 2 diabetes mellitus with other specified complication: Secondary | ICD-10-CM | POA: Diagnosis not present

## 2019-01-11 DIAGNOSIS — M65342 Trigger finger, left ring finger: Secondary | ICD-10-CM | POA: Insufficient documentation

## 2019-01-11 DIAGNOSIS — M65311 Trigger thumb, right thumb: Secondary | ICD-10-CM | POA: Insufficient documentation

## 2019-01-11 DIAGNOSIS — M65331 Trigger finger, right middle finger: Secondary | ICD-10-CM | POA: Diagnosis not present

## 2019-01-11 DIAGNOSIS — M65341 Trigger finger, right ring finger: Secondary | ICD-10-CM | POA: Diagnosis not present

## 2019-01-11 DIAGNOSIS — M65332 Trigger finger, left middle finger: Secondary | ICD-10-CM | POA: Diagnosis not present

## 2019-01-12 DIAGNOSIS — I10 Essential (primary) hypertension: Secondary | ICD-10-CM | POA: Diagnosis not present

## 2019-01-12 DIAGNOSIS — E1169 Type 2 diabetes mellitus with other specified complication: Secondary | ICD-10-CM | POA: Diagnosis not present

## 2019-01-12 DIAGNOSIS — E6609 Other obesity due to excess calories: Secondary | ICD-10-CM | POA: Diagnosis not present

## 2019-01-19 ENCOUNTER — Telehealth: Payer: Self-pay

## 2019-01-19 ENCOUNTER — Encounter (HOSPITAL_COMMUNITY): Payer: Self-pay | Admitting: Emergency Medicine

## 2019-01-19 ENCOUNTER — Emergency Department (HOSPITAL_COMMUNITY)
Admission: EM | Admit: 2019-01-19 | Discharge: 2019-01-19 | Disposition: A | Discharge status: Home / Self Care | Payer: BC Managed Care – PPO | Attending: Emergency Medicine | Admitting: Emergency Medicine

## 2019-01-19 ENCOUNTER — Ambulatory Visit (INDEPENDENT_AMBULATORY_CARE_PROVIDER_SITE_OTHER): Payer: BC Managed Care – PPO | Admitting: Family Medicine

## 2019-01-19 ENCOUNTER — Encounter: Payer: Self-pay | Admitting: Family Medicine

## 2019-01-19 ENCOUNTER — Other Ambulatory Visit: Payer: Self-pay

## 2019-01-19 ENCOUNTER — Emergency Department (HOSPITAL_COMMUNITY): Payer: BC Managed Care – PPO

## 2019-01-19 VITALS — BP 110/70 | HR 99 | Ht 66.5 in | Wt 198.0 lb

## 2019-01-19 DIAGNOSIS — E119 Type 2 diabetes mellitus without complications: Secondary | ICD-10-CM | POA: Insufficient documentation

## 2019-01-19 DIAGNOSIS — Z79899 Other long term (current) drug therapy: Secondary | ICD-10-CM | POA: Insufficient documentation

## 2019-01-19 DIAGNOSIS — E7800 Pure hypercholesterolemia, unspecified: Secondary | ICD-10-CM | POA: Insufficient documentation

## 2019-01-19 DIAGNOSIS — E1169 Type 2 diabetes mellitus with other specified complication: Secondary | ICD-10-CM

## 2019-01-19 DIAGNOSIS — K6289 Other specified diseases of anus and rectum: Secondary | ICD-10-CM | POA: Diagnosis not present

## 2019-01-19 DIAGNOSIS — F17210 Nicotine dependence, cigarettes, uncomplicated: Secondary | ICD-10-CM | POA: Diagnosis not present

## 2019-01-19 HISTORY — DX: Type 2 diabetes mellitus without complications: E11.9

## 2019-01-19 LAB — URINALYSIS, ROUTINE W REFLEX MICROSCOPIC
Bacteria, UA: NONE SEEN
Bilirubin Urine: NEGATIVE
Glucose, UA: 50 mg/dL — AB
Ketones, ur: NEGATIVE mg/dL
Leukocytes,Ua: NEGATIVE
Nitrite: NEGATIVE
Protein, ur: NEGATIVE mg/dL
Specific Gravity, Urine: 1.013 (ref 1.005–1.030)
pH: 5 (ref 5.0–8.0)

## 2019-01-19 LAB — BASIC METABOLIC PANEL
Anion gap: 12 (ref 5–15)
BUN: 21 mg/dL — ABNORMAL HIGH (ref 6–20)
CO2: 20 mmol/L — ABNORMAL LOW (ref 22–32)
Calcium: 9.9 mg/dL (ref 8.9–10.3)
Chloride: 101 mmol/L (ref 98–111)
Creatinine, Ser: 0.72 mg/dL (ref 0.44–1.00)
GFR calc Af Amer: >60 mL/min (ref >60)
GFR calc non Af Amer: >60 mL/min (ref >60)
Glucose, Bld: 102 mg/dL — ABNORMAL HIGH (ref 70–99)
Potassium: 4.4 mmol/L (ref 3.5–5.1)
Sodium: 133 mmol/L — ABNORMAL LOW (ref 135–145)

## 2019-01-19 LAB — WET PREP, GENITAL
Clue Cells Wet Prep HPF POC: NONE SEEN
Sperm: NONE SEEN
Trich, Wet Prep: NONE SEEN
Yeast Wet Prep HPF POC: NONE SEEN

## 2019-01-19 LAB — CBC WITH DIFFERENTIAL/PLATELET
Abs Immature Granulocytes: 0.01 10*3/uL (ref 0.00–0.07)
Basophils Absolute: 0.1 10*3/uL (ref 0.0–0.1)
Basophils Relative: 1 %
Eosinophils Absolute: 0.3 10*3/uL (ref 0.0–0.5)
Eosinophils Relative: 4 %
HCT: 51.2 % — ABNORMAL HIGH (ref 36.0–46.0)
Hemoglobin: 16.4 g/dL — ABNORMAL HIGH (ref 12.0–15.0)
Immature Granulocytes: 0 %
Lymphocytes Relative: 47 %
Lymphs Abs: 3.8 10*3/uL (ref 0.7–4.0)
MCH: 25.8 pg — ABNORMAL LOW (ref 26.0–34.0)
MCHC: 32 g/dL (ref 30.0–36.0)
MCV: 80.5 fL (ref 80.0–100.0)
Monocytes Absolute: 0.7 10*3/uL (ref 0.1–1.0)
Monocytes Relative: 8 %
Neutro Abs: 3.3 10*3/uL (ref 1.7–7.7)
Neutrophils Relative %: 40 %
Platelets: 249 10*3/uL (ref 150–400)
RBC: 6.36 MIL/uL — ABNORMAL HIGH (ref 3.87–5.11)
RDW: 17.8 % — ABNORMAL HIGH (ref 11.5–15.5)
WBC: 8.2 10*3/uL (ref 4.0–10.5)
nRBC: 0 % (ref 0.0–0.2)

## 2019-01-19 MED ORDER — KETOROLAC TROMETHAMINE 15 MG/ML IJ SOLN
15.0000 mg | Freq: Once | INTRAMUSCULAR | Status: AC
  Administered 2019-01-19: 15 mg via INTRAVENOUS
  Filled 2019-01-19: qty 1

## 2019-01-19 MED ORDER — SODIUM CHLORIDE (PF) 0.9 % IJ SOLN
INTRAMUSCULAR | Status: AC
  Filled 2019-01-19: qty 50

## 2019-01-19 MED ORDER — LIDOCAINE HCL URETHRAL/MUCOSAL 2 % EX GEL
1.0000 "application " | Freq: Once | CUTANEOUS | Status: AC
  Administered 2019-01-19: 1
  Filled 2019-01-19: qty 5

## 2019-01-19 MED ORDER — LIDOCAINE 4 % EX CREA
TOPICAL_CREAM | Freq: Once | CUTANEOUS | Status: DC
  Filled 2019-01-19: qty 5

## 2019-01-19 MED ORDER — IOHEXOL 300 MG/ML  SOLN
100.0000 mL | Freq: Once | INTRAMUSCULAR | Status: AC | PRN
  Administered 2019-01-19: 20:00:00 100 mL via INTRAVENOUS

## 2019-01-19 MED ORDER — CIPROFLOXACIN HCL 500 MG PO TABS: 500.0000 mg | ORAL_TABLET | Freq: Two times a day (BID) | ORAL | 0 refills | Status: DC

## 2019-01-19 MED ORDER — METRONIDAZOLE 500 MG PO TABS: 500.0000 mg | ORAL_TABLET | Freq: Three times a day (TID) | ORAL | 0 refills | Status: DC

## 2019-01-19 MED ORDER — HYDROCORTISONE (PERIANAL) 2.5 % EX CREA: 1.0000 "application " | TOPICAL_CREAM | Freq: Every day | CUTANEOUS | 0 refills | Status: AC

## 2019-01-19 NOTE — Telephone Encounter (Signed)

## 2019-01-19 NOTE — ED Provider Notes (Signed)
[facility] DEPT Provider Note   CSN: [id number] Arrival date & time: 01/19/19  1539     History   Chief Complaint Chief Complaint  Patient presents with   burning rectum    HPI Alisha Espinoza is a 53 y.o. female.     HPI 53 year old African-American female with a past medical history significant for diabetes, hyperlipidemia, hypertension who presents to the emergency department today for evaluation of rectal pain and burning.  Patient states that the pain began today.  She denies any pain with bowel movements but states that is very painful and burning throughout the day.  Denies any melena or hematochezia.  She denies any vaginal discharge but states that the pain seems to radiate to her vaginal area.  Patient states that she did recently start the valsartan and Synjardy 2 days ago for diabetes and high blood pressure.  She has never taken these medications before.  No history of yeast infections.  Denies any abdominal pain, nausea or vomiting.  Denies any fevers or chills.  She did apply some Vaseline to the area without any relief.  Patient is not sexually active since 2017.  She denies a history of rectal abscesses. 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   BACTERIAL PNEUMONIA, RIGHT LOWER LOBE 04/27/2008   LUMP OR MASS IN BREAST 04/27/2008   COUGH 04/27/2008    Past Surgical History:  Procedure Laterality Date   ABDOMINAL HYSTERECTOMY  2000   ANTERIOR  CERVICAL DECOMP/DISCECTOMY FUSION  03/19/2012   Procedure: ANTERIOR CERVICAL DECOMPRESSION/DISCECTOMY FUSION 1 LEVEL;  Surgeon: Melina Schools, MD;  Location: [facility];  Service: Orthopedics;  Laterality: Left;  Total Disc Replacement C4-5   ANTERIOR CERVICAL DECOMP/DISCECTOMY FUSION N/A 05/07/2012   Procedure: ANTERIOR CERVICAL DECOMPRESSION/DISCECTOMY FUSION 1 LEVEL/HARDWARE REMOVAL;  Surgeon: Melina Schools, MD;  Location: [facility];  Service: Orthopedics;  Laterality: N/A;  REMOVAL OF CERVICAL DISC REPLACEMENT AND ACDF C4-5   BACK SURGERY     CARPAL TUNNEL RELEASE Right 09/28/2018   Procedure: CARPAL TUNNEL RELEASE;  Surgeon: Eustace Moore, MD;  Location: [facility];  Service: Neurosurgery;  Laterality: Right;  CARPAL TUNNEL RELEASE   CERVICAL FUSION  05/07/2012   Dr Rolena Infante   COLONOSCOPY     CYSTECTOMY     L wrist   POLYPECTOMY     ROTATOR CUFF REPAIR     Right   TYMPANOSTOMY TUBE PLACEMENT       OB History   No obstetric history on file.      Home Medications    Prior to Admission medications   Medication Sig Start Date End Date Taking? Authorizing Provider  albuterol (PROVENTIL HFA;VENTOLIN HFA) 108 (90 Base) MCG/ACT inhaler Inhale 1-2 puffs into the lungs every 4 (four) hours as needed for wheezing or shortness of breath (or coughing). [id number]   Delora Fuel, MD  naproxen sodium (ANAPROX) 550 MG tablet Take 1 tablet by mouth 2 (two) times daily. 01/18/19  [provider]  oxyCODONE (OXY IR/ROXICODONE) 5 MG immediate release tablet Take 1 tablet by mouth every 6 (six) hours as needed. 12/03/18   [provider]  simvastatin (ZOCOR) 20 MG tablet Take 20 mg by mouth daily. 01/26/18   [provider]  tiZANidine (ZANAFLEX) 4 MG tablet Take 1 tablet by mouth every 8 (eight) hours as needed. 01/04/19   [provider]  TRELEGY ELLIPTA 100-62.5-25 MCG/INH AEPB Inhale 1 puff into the lungs daily. 01/04/19   [provider]    Family  History Family History  Problem Relation Age of Onset   Diabetes Mother    Hypertension Father    Pancreatic cancer Sister    Colon cancer Neg Hx    Rectal cancer Neg Hx    Stomach cancer Neg Hx    Colon polyps Neg Hx    Esophageal cancer Neg Hx     Social History Social History   Tobacco Use   Smoking status: Current Every Day Smoker    Packs/day: 0.50    Years: 30.00    Pack years: 15.00    Types: Cigarettes   Smokeless tobacco: Never Used  Substance Use Topics   Alcohol use: Yes    Comment: 1-2 beers on occsion.    Drug use: No     Allergies   Meloxicam   Review of Systems Review of Systems  Constitutional: Negative for chills and unexpected weight change.  HENT: Negative for congestion.   Eyes: Negative for discharge.  Respiratory: Negative for cough.   Gastrointestinal: Positive for rectal pain. Negative for abdominal pain, anal bleeding, blood in stool, constipation, diarrhea, nausea and vomiting.  Genitourinary: Positive for vaginal pain. Negative for dysuria, vaginal bleeding and vaginal discharge.  Musculoskeletal: Negative for myalgias.  Skin: Negative for color change.  Neurological: Negative for headaches.  Psychiatric/Behavioral: Negative for confusion.     Physical Exam Updated Vital Signs BP 127/84 (BP Location: Left Arm)    Pulse (!) 106    Temp 98.9 F (37.2 C) (Oral)    Resp 18    SpO2 100%   Physical Exam Vitals signs and nursing note reviewed.  Constitutional:      General: She is not in acute distress.    Appearance: She is well-developed. She is not ill-appearing or toxic-appearing.  HENT:     Head: Normocephalic and atraumatic.  Eyes:     General: No scleral icterus.       Right eye: No discharge.        Left eye: No discharge.  Neck:     Musculoskeletal: Normal range of motion.  Pulmonary:     Effort: No respiratory distress.  Abdominal:     General: Abdomen is flat. Bowel sounds are normal. There is no  distension.     Palpations: Abdomen is soft. There is no mass.     Tenderness: There is no abdominal tenderness. There is no right CVA tenderness, left CVA tenderness, guarding or rebound.     Hernia: No hernia is present.  Genitourinary:    Comments: Chaperone present for exam. Pt with significant pain to rectal exam unable to perform complete rectal exam.  No external hemorrhoids or fissures noted.  Chaperone present for exam. No external lesions, swelling, erythema, or rash of the labia. No erythema, discharge, bleeding, or lesions noted in the vaginal vault.   Musculoskeletal: Normal range of motion.  Skin:    General: Skin is warm and dry.     Capillary  Refill: Capillary refill takes less than 2 seconds.     Coloration: Skin is not pale.  Neurological:     Mental Status: She is alert.     Comments: Speech normal    Psychiatric:        Behavior: Behavior normal.        Thought Content: Thought content normal.        Judgment: Judgment normal.      ED Treatments / Results  Labs (all labs ordered are listed, but only abnormal results are displayed) Labs Reviewed  WET PREP, GENITAL - Abnormal; Notable for the following components:      Result Value   WBC, Wet Prep HPF POC FEW (*)    All other components within normal limits  CBC WITH DIFFERENTIAL/PLATELET - Abnormal; Notable for the following components:   RBC 6.36 (*)    Hemoglobin 16.4 (*)    HCT 51.2 (*)    MCH 25.8 (*)    RDW 17.8 (*)    All other components within normal limits  BASIC METABOLIC PANEL - Abnormal; Notable for the following components:   Sodium 133 (*)    CO2 20 (*)    Glucose, Bld 102 (*)    BUN 21 (*)    All other components within normal limits  URINALYSIS, ROUTINE W REFLEX MICROSCOPIC - Abnormal; Notable for the following components:   Color, Urine STRAW (*)    APPearance HAZY (*)    Glucose, UA 50 (*)    Hgb urine dipstick SMALL (*)    All other components within normal limits     EKG None  Radiology Ct Abdomen Pelvis W Contrast  Result Date: 01/19/2019 CLINICAL DATA:  Severe rectal pain, rectal burning after beginning new medications for diabetes and hypertension EXAM: CT ABDOMEN AND PELVIS WITH CONTRAST TECHNIQUE: Multidetector CT imaging of the abdomen and pelvis was performed using the standard protocol following bolus administration of intravenous contrast. CONTRAST:  139mL OMNIPAQUE IOHEXOL 300 MG/ML  SOLN COMPARISON:  CT abdomen pelvis 07/27/2012 FINDINGS: Lower chest: Bandlike areas of subsegmental atelectasis and/or scarring present in the right middle lobe and lingula. Lung bases are otherwise clear. Normal heart size. No pericardial effusion. Hepatobiliary: No focal liver abnormality is seen. No gallstones, gallbladder wall thickening, or biliary dilatation. Pancreas: Unremarkable. No pancreatic ductal dilatation or surrounding inflammatory changes. Spleen: Normal in size without focal abnormality. Adrenals/Urinary Tract: Nodular thickening of both adrenal glands. Dominant 1.5 cm nodules are seen in both adrenal glands, unchanged from the comparison noncontrast exam where these both measure density below 0 Hounsfield units compatible with lipid rich adenomas. Kidneys are normal, without renal calculi, focal lesion, or hydronephrosis. Bladder is unremarkable. No extravasation of contrast is seen on excretory phase delayed imaging. Stomach/Bowel: Distal esophagus, stomach and duodenal sweep are unremarkable. No small bowel wall thickening or dilatation. No evidence of obstruction. A normal appendix is visualized. No colonic dilatation or wall thickening. Mild circumferential thickening at the level of the rectum with mucosal hyperemia. No mesorectal fat stranding or infiltration. No evidence of perirectal abscess. Vascular/Lymphatic: Atherosclerotic plaque within the normal caliber aorta. Additional calcification of the branch vessels. No suspicious or enlarged lymph  nodes in the included lymphatic chains. Reproductive: Uterus is surgically absent. No concerning adnexal lesions. Other: No abdominopelvic free fluid or free gas. No bowel containing hernias. Musculoskeletal: Postsurgical changes from prior L3-L5 posterior spinal fusion with interbody spacer placement. Sclerotic changes vertebral bodies adjacent the spacers. Additional degenerative features are noted in both  sacroiliac joints and hips. IMPRESSION: Mild circumferential thickening at the level of the rectum with mucosal hyperemia, suggestive of proctitis. No CT evidence of perirectal abscess. 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 Prior L3-L5 posterior spinal decompression and fusion with sclerotic endplate changes about the interbody spacers. Electronically Signed   By: Lovena Le M.D.   On: 01/19/2019 20:39    Procedures Procedures (including critical care time)  Medications Ordered in ED Medications - No data to display   Initial Impression / Assessment and Plan / ED Course  I have reviewed the triage vital signs and the nursing notes.  Pertinent labs & imaging results that were available during my care of the patient were reviewed by me and considered in my medical decision making (see chart for details).        53 year old African-American female presents the ER for evaluation of rectal pain and burning.  Unsure if this was related to her medication that she started 3 days ago.  Patient denies any significant abdominal pain.  No fevers or chills.  Vital signs are reassuring.  On exam unable to perform rectal exam given patient's tenderness.  There is no obvious fluctuance noted.  Pelvic exam reveals no discharge or signs of yeast infection.  Labs show no leukocytosis.  Hemoglobin mildly elevated with history of same.  Normal platelet count.  No significant electrolyte abnormality.  Normal kidney function.  UA shows no signs of infection.  Wet prep shows some WBCs but  no signs of yeast or other acute abnormality.  Given patient's pain with rectal exam concern for possible perirectal abscess, fistula, proctitis.  Will obtain CT abdomen pelvis.  CT returned that shows concern for proctitis.  She has no signs of perirectal abscess.  Does have chronic findings of bilateral adrenal nodules compatible lipid rich adenomas and prior cervical fusion.  Patient states that she is not sexually active.  Have low concern for STD as cause of her proctitis.  Will treat with Flagyl and Cipro given patient's history of diabetes.  No systemic signs of infection.  Patient will need close follow-up with primary care doctor and further referral for GI for ongoing symptoms.  Discussed pain control at home along with sitz bath and have provided a Anusol cream to use on the external anal opening but discussed patient that she should avoid inserting anything into her rectum.  Patient will need close follow-up return precautions were discussed.  Pt is hemodynamically stable, in NAD, & able to ambulate in the ED. Evaluation does not show pathology that would require ongoing emergent intervention or inpatient treatment. I explained the diagnosis to the patient. Pain has been managed & has no complaints prior to dc. Pt is comfortable with above plan and is stable for discharge at this time. All questions were answered prior to disposition. Strict return precautions for f/u to the ED were discussed. Encouraged follow up with PCP.   Case discussed with my attending who is agreeable the above plan.    Final Clinical Impressions(s) / ED Diagnoses   Final diagnoses:  Rectal pain    ED Discharge Orders         Ordered    ciprofloxacin (CIPRO) 500 MG tablet  Every 12 hours     01/19/19 2053    metroNIDAZOLE (FLAGYL) 500 MG tablet  3 times daily     01/19/19 2053    hydrocortisone (ANUSOL-HC) 2.5 % rectal cream  Daily     01/19/19 2053  Doristine Devoid, PA-C 01/19/19 2105     Julianne Rice, MD 01/19/19 2228

## 2019-01-19 NOTE — Progress Notes (Signed)
Established Patient Office Visit  Subjective:  Patient ID: Alisha Espinoza, female    DOB: 01/15/1966  Age: 53 y.o. MRN: [id number]  CC:  Chief Complaint  Patient presents with  . Establish Care    HPI Alisha Espinoza presents for establishment of care and follow-up of her diagnosed diabetes hypertension and elevated cholesterol.  Patient has not been tolerating her given blood pressure medicine and feels as though it makes her lightheaded and gives her a headache.  She has been taking it intermittently.  When she measures her blood pressure off of the medicine it is been running in the 1 teens over 80s.  She never took her diabetes medicine because she heard that it had been recalled.  She is taking her cholesterol medicine.  She is currently disabled on account of the back issue.  She is under the care of her surgeon status post back surgery back in June.  She has not seen a dentist this year or seen an ophthalmologist.  She does smoke a half a pack of cigarettes daily.  She drinks beer on occasion.  She tells me that she drinks no more than 1 or 2 beers when she does drink  Past Medical History:  Diagnosis Date  . Bronchitis   . Diabetes mellitus ([place]) 01/19/2019  . Dyspnea   . Heart murmur    resolved "closed by age 66."   . Hyperlipidemia   . Seasonal allergies     Past Surgical History:  Procedure Laterality Date  . ABDOMINAL HYSTERECTOMY  2000  . ANTERIOR CERVICAL DECOMP/DISCECTOMY FUSION  03/19/2012   Procedure: ANTERIOR CERVICAL DECOMPRESSION/DISCECTOMY FUSION 1 LEVEL;  Surgeon: Melina Schools, MD;  Location: [facility];  Service: Orthopedics;  Laterality: Left;  Total Disc Replacement C4-5  . ANTERIOR CERVICAL DECOMP/DISCECTOMY FUSION N/A 05/07/2012   Procedure: ANTERIOR CERVICAL DECOMPRESSION/DISCECTOMY FUSION 1 LEVEL/HARDWARE REMOVAL;  Surgeon: Melina Schools, MD;  Location: [facility];  Service: Orthopedics;  Laterality: N/A;  REMOVAL OF CERVICAL DISC REPLACEMENT AND ACDF C4-5  .  BACK SURGERY    . CARPAL TUNNEL RELEASE Right 09/28/2018   Procedure: CARPAL TUNNEL RELEASE;  Surgeon: Eustace Moore, MD;  Location: [facility];  Service: Neurosurgery;  Laterality: Right;  CARPAL TUNNEL RELEASE  . CERVICAL FUSION  05/07/2012   Dr Rolena Infante  . COLONOSCOPY    . CYSTECTOMY     L wrist  . POLYPECTOMY    . ROTATOR CUFF REPAIR     Right  . TYMPANOSTOMY TUBE PLACEMENT      Family History  Problem Relation Age of Onset  . Diabetes Mother   . Hypertension Father   . Pancreatic cancer Sister   . Colon cancer Neg Hx   . Rectal cancer Neg Hx   . Stomach cancer Neg Hx   . Colon polyps Neg Hx   . Esophageal cancer Neg Hx     Social History   Socioeconomic History  . Marital status: Divorced    Spouse name: Not on file  . Number of children: Not on file  . Years of education: Not on file  . Highest education level: Not on file  Occupational History  . Not on file  Social Needs  . Financial resource strain: Not on file  . Food insecurity    Worry: Not on file    Inability: Not on file  . Transportation needs    Medical: Not on file    Non-medical: Not on file  Tobacco Use  .  Smoking status: Current Every Day Smoker    Packs/day: 0.50    Years: 30.00    Pack years: 15.00    Types: Cigarettes  . Smokeless tobacco: Never Used  Substance and Sexual Activity  . Alcohol use: Yes    Comment: occasional  . Drug use: No  . Sexual activity: Yes    Birth control/protection: Surgical    Comment: Hysterectomy  Lifestyle  . Physical activity    Days per week: Not on file    Minutes per session: Not on file  . Stress: Not on file  Relationships  . Social Herbalist on phone: Not on file    Gets together: Not on file    Attends religious service: Not on file    Active member of club or organization: Not on file    Attends meetings of clubs or organizations: Not on file    Relationship status: Not on file  . Intimate partner violence    Fear of current or ex  partner: Not on file    Emotionally abused: Not on file    Physically abused: Not on file    Forced sexual activity: Not on file  Other Topics Concern  . Not on file  Social History Narrative  . Not on file    Outpatient Medications Prior to Visit  Medication Sig Dispense Refill  . albuterol (PROVENTIL HFA;VENTOLIN HFA) 108 (90 Base) MCG/ACT inhaler Inhale 1-2 puffs into the lungs every 4 (four) hours as needed for wheezing or shortness of breath (or coughing). 1 Inhaler 1  . naproxen sodium (ANAPROX) 550 MG tablet Take 1 tablet by mouth 2 (two) times daily.    Marland Kitchen oxyCODONE (OXY IR/ROXICODONE) 5 MG immediate release tablet Take 1 tablet by mouth every 6 (six) hours as needed.    . simvastatin (ZOCOR) 20 MG tablet Take 20 mg by mouth daily.  1  . SYNJARDY XR 07-998 MG TB24 Take 1 tablet by mouth daily.    Marland Kitchen tiZANidine (ZANAFLEX) 4 MG tablet Take 1 tablet by mouth every 8 (eight) hours as needed.    . TRELEGY ELLIPTA 100-62.5-25 MCG/INH AEPB Inhale 1 puff into the lungs daily.    . valsartan-hydrochlorothiazide (DIOVAN-HCT) 160-12.5 MG tablet Take 1 tablet by mouth daily.    . diazepam (VALIUM) 2 MG tablet Take 1 tablet (2 mg total) by mouth every 8 (eight) hours as needed for up to 12 doses for muscle spasms. 12 tablet 0  . HYDROcodone-acetaminophen (NORCO/VICODIN) 5-325 MG tablet Take 1 tablet by mouth every 6 (six) hours as needed for up to 12 doses. 12 tablet 0  . ibuprofen (ADVIL) 800 MG tablet Take 1 tablet (800 mg total) by mouth every 8 (eight) hours as needed for mild pain or moderate pain. 21 tablet 0  . methylPREDNISolone (MEDROL DOSEPAK) 4 MG TBPK tablet Follow package insert (Patient not taking: Reported on 10/08/2018) 21 each 0  . 0.9 %  sodium chloride infusion      No facility-administered medications prior to visit.     Allergies  Allergen Reactions  . Meloxicam Hives    ROS Review of Systems    Objective:    Physical Exam  BP 110/70   Pulse 99   Ht 5' 6.5"  (1.689 m)   Wt 198 lb (89.8 kg)   SpO2 94%   BMI 31.48 kg/m  Wt Readings from Last 3 Encounters:  01/19/19 198 lb (89.8 kg)  10/07/18 203 lb 11.3 oz (  92.4 kg)  10/02/18 203 lb 11.3 oz (92.4 kg)   BP Readings from Last 3 Encounters:  01/19/19 110/70  10/08/18 (!) 141/79  10/02/18 (!) 123/93   Guideline developer:  UpToDate (see UpToDate for funding source) Date Released: June 2014  Health Maintenance Due  Topic Date Due  . FOOT EXAM  05/21/1975  . OPHTHALMOLOGY EXAM  05/21/1975  . HIV Screening  05/20/1980  . TETANUS/TDAP  05/20/1984  . PAP SMEAR-Modifier  05/20/1986  . HEMOGLOBIN A1C  02/09/2016    There are no preventive care reminders to display for this patient.  No results found for: TSH Lab Results  Component Value Date   WBC 7.5 09/21/2018   HGB 15.5 (H) 09/21/2018   HCT 49.1 (H) 09/21/2018   MCV 82.4 09/21/2018   PLT 207 09/21/2018   Lab Results  Component Value Date   NA 142 09/21/2018   K 3.8 09/21/2018   CO2 24 09/21/2018   GLUCOSE 117 (H) 09/21/2018   BUN 11 09/21/2018   CREATININE 0.76 09/21/2018   BILITOT 0.5 08/08/2015   ALKPHOS 85 08/08/2015   AST 30 08/08/2015   ALT 39 08/08/2015   PROT 7.5 08/08/2015   ALBUMIN 4.2 08/08/2015   CALCIUM 9.9 09/21/2018   ANIONGAP 9 09/21/2018   Lab Results  Component Value Date   CHOL 201 ([place]) 04/27/2008   Lab Results  Component Value Date   HDL 46.6 04/27/2008   No results found for: [facility] Lab Results  Component Value Date   TRIG 65 04/27/2008   Lab Results  Component Value Date   CHOLHDL 4.3 CALC 04/27/2008   Lab Results  Component Value Date   HGBA1C 6.7 (H) 08/09/2015      Assessment & Plan:   Problem List Items Addressed This Visit      Endocrine   Diabetes mellitus ([place]) - Primary   Relevant Medications   SYNJARDY XR 07-998 MG TB24   valsartan-hydrochlorothiazide (DIOVAN-HCT) 160-12.5 MG tablet   Other Relevant Orders   CBC   Comprehensive metabolic panel   Hemoglobin A1c    Microalbumin / creatinine urine ratio   Urinalysis, Routine w reflex microscopic   Ambulatory referral to Ophthalmology     Other   Elevated cholesterol   Relevant Medications   valsartan-hydrochlorothiazide (DIOVAN-HCT) 160-12.5 MG tablet   Other Relevant Orders   Comprehensive metabolic panel   LDL cholesterol, direct   Lipid panel      No orders of the defined types were placed in this encounter.   Follow-up: Return in about 2 weeks (around 02/02/2019).  Patient will return in the morning for above ordered fasting blood work.  She will not take her blood pressure medicine medicine or her diabetes medicine and follow-up with me in 2 weeks.  She will continue to take her Zocor.

## 2019-01-19 NOTE — ED Triage Notes (Signed)
Per pt, states she started some new medication for DM and HTN-states since taking meds her rectum is burning-no rectal bleeding and no history of hemorrhoids

## 2019-01-19 NOTE — ED Notes (Signed)
Patient has called out multiple times. Patient states her rectum is burning. Patient becoming agitated. PA made aware

## 2019-01-19 NOTE — Discharge Instructions (Addendum)
Overall your labs are reassuring.  Your hemoglobin is mildly elevated.  Your CAT scan shows concern for inflammation of your rectum.  Do not take Zanaflex with this medication.  Have treated you with 2 different antibiotics.  Do not consume alcohol take these medications.  Of also given your cream to applied to the outside of your rectum but please do not insert in your rectum.  Motrin Tylenol for pain.  Warm sitz bath at home.  Follow-up with your primary care doctor return to ER any worsening symptoms.

## 2019-01-20 ENCOUNTER — Other Ambulatory Visit (INDEPENDENT_AMBULATORY_CARE_PROVIDER_SITE_OTHER): Payer: BC Managed Care – PPO

## 2019-01-20 ENCOUNTER — Ambulatory Visit: Payer: BC Managed Care – PPO | Admitting: Family Medicine

## 2019-01-20 DIAGNOSIS — E7800 Pure hypercholesterolemia, unspecified: Secondary | ICD-10-CM | POA: Diagnosis not present

## 2019-01-20 DIAGNOSIS — E1169 Type 2 diabetes mellitus with other specified complication: Secondary | ICD-10-CM | POA: Diagnosis not present

## 2019-01-20 LAB — URINALYSIS, ROUTINE W REFLEX MICROSCOPIC
Bilirubin Urine: NEGATIVE
Hgb urine dipstick: NEGATIVE
Leukocytes,Ua: NEGATIVE
Nitrite: NEGATIVE
Specific Gravity, Urine: 1.025 (ref 1.000–1.030)
Total Protein, Urine: NEGATIVE
Urine Glucose: NEGATIVE
Urobilinogen, UA: 0.2 (ref 0.0–1.0)
pH: 5 (ref 5.0–8.0)

## 2019-01-20 LAB — MICROALBUMIN / CREATININE URINE RATIO
Creatinine,U: 163.2 mg/dL
Microalb Creat Ratio: 1.1 mg/g (ref 0.0–30.0)
Microalb, Ur: 1.8 mg/dL (ref 0.0–1.9)

## 2019-01-20 LAB — COMPREHENSIVE METABOLIC PANEL
ALT: 23 U/L (ref 0–35)
AST: 17 U/L (ref 0–37)
Albumin: 4.7 g/dL (ref 3.5–5.2)
Alkaline Phosphatase: 78 U/L (ref 39–117)
BUN: 18 mg/dL (ref 6–23)
CO2: 29 mEq/L (ref 19–32)
Calcium: 10.8 mg/dL — ABNORMAL HIGH (ref 8.4–10.5)
Chloride: 102 mEq/L (ref 96–112)
Creatinine, Ser: 0.87 mg/dL (ref 0.40–1.20)
GFR: 82.2 mL/min (ref >60.00)
Glucose, Bld: 120 mg/dL — ABNORMAL HIGH (ref 70–99)
Potassium: 5.4 mEq/L — ABNORMAL HIGH (ref 3.5–5.1)
Sodium: 137 mEq/L (ref 135–145)
Total Bilirubin: 0.5 mg/dL (ref 0.2–1.2)
Total Protein: 6.9 g/dL (ref 6.0–8.3)

## 2019-01-20 LAB — LIPID PANEL
Cholesterol: 170 mg/dL (ref 0–200)
HDL: 46.8 mg/dL (ref >39.00)
LDL Cholesterol: 100 mg/dL — ABNORMAL HIGH (ref 0–99)
NonHDL: 122.78
Total CHOL/HDL Ratio: 4
Triglycerides: 116 mg/dL (ref 0.0–149.0)
VLDL: 23.2 mg/dL (ref 0.0–40.0)

## 2019-01-20 LAB — HEMOGLOBIN A1C: Hgb A1c MFr Bld: 7.1 % — ABNORMAL HIGH (ref 4.6–6.5)

## 2019-01-20 LAB — CBC
HCT: 48.9 % — ABNORMAL HIGH (ref 36.0–46.0)
Hemoglobin: 15.9 g/dL — ABNORMAL HIGH (ref 12.0–15.0)
MCHC: 32.5 g/dL (ref 30.0–36.0)
MCV: 80.3 fl (ref 78.0–100.0)
Platelets: 226 10*3/uL (ref 150.0–400.0)
RBC: 6.09 Mil/uL — ABNORMAL HIGH (ref 3.87–5.11)
RDW: 16.8 % — ABNORMAL HIGH (ref 11.5–15.5)
WBC: 6.8 10*3/uL (ref 4.0–10.5)

## 2019-01-20 LAB — LDL CHOLESTEROL, DIRECT: Direct LDL: 95 mg/dL

## 2019-01-21 ENCOUNTER — Encounter: Payer: Self-pay | Admitting: Family Medicine

## 2019-01-21 ENCOUNTER — Telehealth: Payer: Self-pay | Admitting: Family Medicine

## 2019-01-21 ENCOUNTER — Ambulatory Visit (INDEPENDENT_AMBULATORY_CARE_PROVIDER_SITE_OTHER): Payer: BC Managed Care – PPO | Admitting: Family Medicine

## 2019-01-21 ENCOUNTER — Telehealth: Payer: Self-pay

## 2019-01-21 ENCOUNTER — Other Ambulatory Visit: Payer: Self-pay

## 2019-01-21 VITALS — BP 140/80 | HR 95 | Temp 97.5°F | Ht 66.5 in

## 2019-01-21 DIAGNOSIS — I10 Essential (primary) hypertension: Secondary | ICD-10-CM

## 2019-01-21 DIAGNOSIS — K649 Unspecified hemorrhoids: Secondary | ICD-10-CM | POA: Diagnosis not present

## 2019-01-21 DIAGNOSIS — E1169 Type 2 diabetes mellitus with other specified complication: Secondary | ICD-10-CM | POA: Diagnosis not present

## 2019-01-21 LAB — BASIC METABOLIC PANEL
BUN: 16 mg/dL (ref 6–23)
CO2: 25 mEq/L (ref 19–32)
Calcium: 10 mg/dL (ref 8.4–10.5)
Chloride: 104 mEq/L (ref 96–112)
Creatinine, Ser: 0.87 mg/dL (ref 0.40–1.20)
GFR: 82.2 mL/min (ref >60.00)
Glucose, Bld: 86 mg/dL (ref 70–99)
Potassium: 4.1 mEq/L (ref 3.5–5.1)
Sodium: 135 mEq/L (ref 135–145)

## 2019-01-21 MED ORDER — METFORMIN HCL 500 MG PO TABS: 500.0000 mg | ORAL_TABLET | Freq: Two times a day (BID) | ORAL | 3 refills | Status: DC

## 2019-01-21 MED ORDER — DOCUSATE SODIUM 100 MG PO CAPS: 100.0000 mg | ORAL_CAPSULE | Freq: Two times a day (BID) | ORAL | 0 refills | Status: DC

## 2019-01-21 MED ORDER — WITCH HAZEL-GLYCERIN EX PADS: 1.0000 "application " | MEDICATED_PAD | CUTANEOUS | 12 refills | Status: DC | PRN

## 2019-01-21 NOTE — Telephone Encounter (Signed)
WK-Pt called again/she states that you named off another medication during the last OV other than the Metformin/she would like for you to send in the one that you named/I'm sorry but I looked in the visit note and did not find in order to be helpful/plz advise/thx dmf

## 2019-01-21 NOTE — Progress Notes (Signed)
Established Patient Office Visit  Subjective:  Patient ID: Alisha Espinoza, female    DOB: [birth year]  Age: 53 y.o. MRN: [id number]  CC:  Chief Complaint  Patient presents with  . Follow-up    HPI Alisha Espinoza presents for follow-up of her ER visit for acute rectal pain.  Patient was given a prescription for Anusol Cipro and Flagyl and advised to follow-up with me.  Anusol seems to be helping.  Patient does have a history of intermittent constipation.  She sometimes takes a stool softener for this.  She is rarely using her oxycodone at this time for back pain she tells me.  Blood work drawn in the ER did show an elevated potassium.  She is on no medications that would elevate her potassium.  Past Medical History:  Diagnosis Date  . Bronchitis   . Diabetes mellitus ([place]) 01/19/2019  . Dyspnea   . Heart murmur    resolved "closed by age 72."   . Hyperlipidemia   . Seasonal allergies     Past Surgical History:  Procedure Laterality Date  . ABDOMINAL HYSTERECTOMY  2000  . ANTERIOR CERVICAL DECOMP/DISCECTOMY FUSION  03/19/2012   Procedure: ANTERIOR CERVICAL DECOMPRESSION/DISCECTOMY FUSION 1 LEVEL;  Surgeon: Melina Schools, MD;  Location: [facility];  Service: Orthopedics;  Laterality: Left;  Total Disc Replacement C4-5  . ANTERIOR CERVICAL DECOMP/DISCECTOMY FUSION N/A 05/07/2012   Procedure: ANTERIOR CERVICAL DECOMPRESSION/DISCECTOMY FUSION 1 LEVEL/HARDWARE REMOVAL;  Surgeon: Melina Schools, MD;  Location: [facility];  Service: Orthopedics;  Laterality: N/A;  REMOVAL OF CERVICAL DISC REPLACEMENT AND ACDF C4-5  . BACK SURGERY    . CARPAL TUNNEL RELEASE Right 09/28/2018   Procedure: CARPAL TUNNEL RELEASE;  Surgeon: Eustace Moore, MD;  Location: [place];  Service: Neurosurgery;  Laterality: Right;  CARPAL TUNNEL RELEASE  . CERVICAL FUSION  05/07/2012   Dr Rolena Infante  . COLONOSCOPY    . CYSTECTOMY     L wrist  . POLYPECTOMY    . ROTATOR CUFF REPAIR     Right  . TYMPANOSTOMY TUBE PLACEMENT       Family History  Problem Relation Age of Onset  . Diabetes Mother   . Hypertension Father   . Pancreatic cancer Sister   . Colon cancer Neg Hx   . Rectal cancer Neg Hx   . Stomach cancer Neg Hx   . Colon polyps Neg Hx   . Esophageal cancer Neg Hx     Social History   Socioeconomic History  . Marital status: Divorced    Spouse name: Not on file  . Number of children: Not on file  . Years of education: Not on file  . Highest education level: Not on file  Occupational History  . Not on file  Social Needs  . Financial resource strain: Not on file  . Food insecurity    Worry: Not on file    Inability: Not on file  . Transportation needs    Medical: Not on file    Non-medical: Not on file  Tobacco Use  . Smoking status: Current Every Day Smoker    Packs/day: 0.50    Years: 30.00    Pack years: 15.00    Types: Cigarettes  . Smokeless tobacco: Never Used  Substance and Sexual Activity  . Alcohol use: Yes    Comment: 1-2 beers on occsion.   . Drug use: No  . Sexual activity: Yes    Birth control/protection: Surgical    Comment: Hysterectomy  Lifestyle  . Physical activity    Days per week: Not on file    Minutes per session: Not on file  . Stress: Not on file  Relationships  . Social Herbalist on phone: Not on file    Gets together: Not on file    Attends religious service: Not on file    Active member of club or organization: Not on file    Attends meetings of clubs or organizations: Not on file    Relationship status: Not on file  . Intimate partner violence    Fear of current or ex partner: Not on file    Emotionally abused: Not on file    Physically abused: Not on file    Forced sexual activity: Not on file  Other Topics Concern  . Not on file  Social History Narrative  . Not on file    Outpatient Medications Prior to Visit  Medication Sig Dispense Refill  . albuterol (PROVENTIL HFA;VENTOLIN HFA) 108 (90 Base) MCG/ACT inhaler Inhale 1-2  puffs into the lungs every 4 (four) hours as needed for wheezing or shortness of breath (or coughing). 1 Inhaler 1  . ciprofloxacin (CIPRO) 500 MG tablet Take 1 tablet (500 mg total) by mouth every 12 (twelve) hours. 14 tablet 0  . hydrocortisone (ANUSOL-HC) 2.5 % rectal cream Place 1 application rectally daily for 5 days. 7.5 g 0  . metroNIDAZOLE (FLAGYL) 500 MG tablet Take 1 tablet (500 mg total) by mouth 3 (three) times daily. DO NOT CONSUME ALCOHOL WHILE TAKING THIS MEDICATION. 21 tablet 0  . naproxen sodium (ANAPROX) 550 MG tablet Take 1 tablet by mouth 2 (two) times daily.    Marland Kitchen oxyCODONE (OXY IR/ROXICODONE) 5 MG immediate release tablet Take 1 tablet by mouth every 6 (six) hours as needed.    . simvastatin (ZOCOR) 20 MG tablet Take 20 mg by mouth daily.  1  . tiZANidine (ZANAFLEX) 4 MG tablet Take 1 tablet by mouth every 8 (eight) hours as needed.    . TRELEGY ELLIPTA 100-62.5-25 MCG/INH AEPB Inhale 1 puff into the lungs daily.     No facility-administered medications prior to visit.     Allergies  Allergen Reactions  . Meloxicam Hives    ROS Review of Systems  Constitutional: Negative.   Respiratory: Negative.   Cardiovascular: Negative.   Gastrointestinal: Positive for constipation and rectal pain. Negative for anal bleeding, blood in stool, nausea and vomiting.  Musculoskeletal: Positive for back pain.  Neurological: Negative.   Hematological: Does not bruise/bleed easily.      Objective:    Physical Exam  Constitutional: She is oriented to person, place, and time. She appears well-developed and well-nourished. No distress.  HENT:  Head: Normocephalic and atraumatic.  Right Ear: External ear normal.  Left Ear: External ear normal.  Eyes: Conjunctivae are normal. Right eye exhibits no discharge. Left eye exhibits no discharge. No scleral icterus.  Neck: No JVD present. No tracheal deviation present.  Pulmonary/Chest: Effort normal. No stridor.  Genitourinary:      Neurological: She is alert and oriented to person, place, and time.  Skin: Skin is warm and dry. She is not diaphoretic.  Psychiatric: She has a normal mood and affect. Her behavior is normal.    BP 140/80   Pulse 95   Temp (!) 97.5 F (36.4 C) (Tympanic)   Ht 5' 6.5" (1.689 m)   SpO2 95%   BMI 31.48 kg/m  Wt Readings from Last 3  Encounters:  01/19/19 198 lb (89.8 kg)  10/07/18 203 lb 11.3 oz (92.4 kg)  10/02/18 203 lb 11.3 oz (92.4 kg)   BP Readings from Last 3 Encounters:  01/21/19 140/80  01/19/19 (!) 155/99  01/19/19 110/70   Guideline developer:  UpToDate (see UpToDate for funding source) Date Released: June 2014  Health Maintenance Due  Topic Date Due  . FOOT EXAM  05/21/1975  . OPHTHALMOLOGY EXAM  05/21/1975  . HIV Screening  05/20/1980  . TETANUS/TDAP  05/20/1984  . PAP SMEAR-Modifier  05/20/1986    There are no preventive care reminders to display for this patient.  No results found for: TSH Lab Results  Component Value Date   WBC 6.8 01/20/2019   HGB 15.9 (H) 01/20/2019   HCT 48.9 (H) 01/20/2019   MCV 80.3 01/20/2019   PLT 226.0 01/20/2019   Lab Results  Component Value Date   NA 137 01/20/2019   K 5.4 No hemolysis seen (H) 01/20/2019   CO2 29 01/20/2019   GLUCOSE 120 (H) 01/20/2019   BUN 18 01/20/2019   CREATININE 0.87 01/20/2019   BILITOT 0.5 01/20/2019   ALKPHOS 78 01/20/2019   AST 17 01/20/2019   ALT 23 01/20/2019   PROT 6.9 01/20/2019   ALBUMIN 4.7 01/20/2019   CALCIUM 10.8 (H) 01/20/2019   ANIONGAP 12 01/19/2019   GFR 82.20 01/20/2019   Lab Results  Component Value Date   CHOL 170 01/20/2019   Lab Results  Component Value Date   HDL 46.80 01/20/2019   Lab Results  Component Value Date   LDLCALC 100 (H) 01/20/2019   Lab Results  Component Value Date   TRIG 116.0 01/20/2019   Lab Results  Component Value Date   CHOLHDL 4 01/20/2019   Lab Results  Component Value Date   HGBA1C 7.1 (H) 01/20/2019      Assessment &  Plan:   Problem List Items Addressed This Visit      Cardiovascular and Mediastinum   Hemorrhoids   Relevant Medications   witch hazel-glycerin (TUCKS) pad   docusate sodium (COLACE) 100 MG capsule   Essential hypertension   Relevant Orders   Basic metabolic panel     Endocrine   Diabetes mellitus ([place]) - Primary   Relevant Medications   metFORMIN (GLUCOPHAGE) 500 MG tablet   Other Relevant Orders   Basic metabolic panel      Meds ordered this encounter  Medications  . metFORMIN (GLUCOPHAGE) 500 MG tablet    Sig: Take 1 tablet (500 mg total) by mouth 2 (two) times daily with a meal.    Dispense:  180 tablet    Refill:  3  . witch hazel-glycerin (TUCKS) pad    Sig: Apply 1 application topically as needed for itching.    Dispense:  40 each    Refill:  12  . docusate sodium (COLACE) 100 MG capsule    Sig: Take 1 capsule (100 mg total) by mouth 2 (two) times daily.    Dispense:  10 capsule    Refill:  0    Follow-up: Return in about 2 weeks (around 02/04/2019), or if symptoms worsen or fail to improve.   Patient will restart her Diovan with HCTZ.  She will continue her Anusol cream.  She was given information on hemorrhoids and sitz bath.  Advised patient to use Colace twice daily and wipe with Tucks pads after stooling.  She will go ahead and start the Metformin.  Explained that the immediate release  formulation was not recalled.

## 2019-01-21 NOTE — Telephone Encounter (Signed)
Copied from [place] [id number]. Topic: General - Inquiry >> Jan 21, 2019  3:29 PM Alease Frame wrote: Reason for CRM: Patient has questions concerning the medication switch requested for the doctor and would like a call back   Call back number [phone]

## 2019-01-21 NOTE — Telephone Encounter (Signed)
Copied from [place] [phone]. Topic: General - Other >> Jan 21, 2019  4:20 PM Keene Breath wrote: Reason for CRM: Patient is calling change her medication from Metformin to another diabetes medication.  Patient stated she discussed this with the doctor.  Please call to discuss which medication she can get at [phone]

## 2019-01-21 NOTE — Patient Instructions (Signed)
Hemorrhoids Hemorrhoids are swollen veins that may develop:  In the butt (rectum). These are called internal hemorrhoids.  Around the opening of the butt (anus). These are called external hemorrhoids. Hemorrhoids can cause pain, itching, or bleeding. Most of the time, they do not cause serious problems. They usually get better with diet changes, lifestyle changes, and other home treatments. What are the causes? This condition may be caused by:  Having trouble pooping (constipation).  Pushing hard (straining) to poop.  Watery poop (diarrhea).  Pregnancy.  Being very overweight (obese).  Sitting for long periods of time.  Heavy lifting or other activity that causes you to strain.  Anal sex.  Riding a bike for a long period of time. What are the signs or symptoms? Symptoms of this condition include:  Pain.  Itching or soreness in the butt.  Bleeding from the butt.  Leaking poop.  Swelling in the area.  One or more lumps around the opening of your butt. How is this diagnosed? A doctor can often diagnose this condition by looking at the affected area. The doctor may also:  Do an exam that involves feeling the area with a gloved hand (digital rectal exam).  Examine the area inside your butt using a small tube (anoscope).  Order blood tests. This may be done if you have lost a lot of blood.  Have you get a test that involves looking inside the colon using a flexible tube with a camera on the end (sigmoidoscopy or colonoscopy). How is this treated? This condition can usually be treated at home. Your doctor may tell you to change what you eat, make lifestyle changes, or try home treatments. If these do not help, procedures can be done to remove the hemorrhoids or make them smaller. These may involve:  Placing rubber bands at the base of the hemorrhoids to cut off their blood supply.  Injecting medicine into the hemorrhoids to shrink them.  Shining a type of light  energy onto the hemorrhoids to cause them to fall off.  Doing surgery to remove the hemorrhoids or cut off their blood supply. Follow these instructions at home: Eating and drinking   Eat foods that have a lot of fiber in them. These include whole grains, beans, nuts, fruits, and vegetables.  Ask your doctor about taking products that have added fiber (fibersupplements).  Reduce the amount of fat in your diet. You can do this by: ? Eating low-fat dairy products. ? Eating less red meat. ? Avoiding processed foods.  Drink enough fluid to keep your pee (urine) pale yellow. Managing pain and swelling   Take a warm-water bath (sitz bath) for 20 minutes to ease pain. Do this 3-4 times a day. You may do this in a bathtub or using a portable sitz bath that fits over the toilet.  If told, put ice on the painful area. It may be helpful to use ice between your warm baths. ? Put ice in a plastic bag. ? Place a towel between your skin and the bag. ? Leave the ice on for 20 minutes, 2-3 times a day. General instructions  Take over-the-counter and prescription medicines only as told by your doctor. ? Medicated creams and medicines may be used as told.  Exercise often. Ask your doctor how much and what kind of exercise is best for you.  Go to the bathroom when you have the urge to poop. Do not wait.  Avoid pushing too hard when you poop.  Keep your  butt dry and clean. Use wet toilet paper or moist towelettes after pooping.  Do not sit on the toilet for a long time.  Keep all follow-up visits as told by your doctor. This is important. Contact a doctor if you:  Have pain and swelling that do not get better with treatment or medicine.  Have trouble pooping.  Cannot poop.  Have pain or swelling outside the area of the hemorrhoids. Get help right away if you have:  Bleeding that will not stop. Summary  Hemorrhoids are swollen veins in the butt or around the opening of the  butt.  They can cause pain, itching, or bleeding.  Eat foods that have a lot of fiber in them. These include whole grains, beans, nuts, fruits, and vegetables.  Take a warm-water bath (sitz bath) for 20 minutes to ease pain. Do this 3-4 times a day. This information is not intended to replace advice given to you by your health care provider. Make sure you discuss any questions you have with your health care provider. Document Released: 12/26/2007 Document Revised: 03/26/2018 Document Reviewed: 08/07/2017 Elsevier Patient Education  2020 [employer].  How to Take a [employer] A sitz bath is a warm water bath that may be used to care for your rectum, genital area, or the area between your rectum and genitals (perineum). For a sitz bath, the water only comes up to your hips and covers your buttocks. A sitz bath may done at home in a bathtub or with a portable sitz bath that fits over the toilet. Your health care provider may recommend a sitz bath to help:  Relieve pain and discomfort after delivering a baby.  Relieve pain and itching from hemorrhoids or anal fissures.  Relieve pain after certain surgeries.  Relax muscles that are sore or tight. How to take a sitz bath Take 3-4 sitz baths a day, or as many as told by your health care provider. Bathtub sitz bath To take a sitz bath in a bathtub: 1. Partially fill a bathtub with warm water. The water should be deep enough to cover your hips and buttocks when you are sitting in the tub. 2. If your health care provider told you to put medicine in the water, follow his or her instructions. 3. Sit in the water. 4. Open the tub drain a little, and leave it open during your bath. 5. Turn on the warm water again, enough to replace the water that is draining out. Keep the water running throughout your bath. This helps keep the water at the right level and the right temperature. 6. Soak in the water for 15-20 minutes, or as long as told by your  health care provider. 7. When you are done, be careful when you stand up. You may feel dizzy. 8. After the sitz bath, pat yourself dry. Do not rub your skin to dry it.  Over-the-toilet sitz bath To take a sitz bath with an over-the-toilet basin: 1. Follow the manufacturer's instructions. 2. Fill the basin with warm water. 3. If your health care provider told you to put medicine in the water, follow his or her instructions. 4. Sit on the seat. Make sure the water covers your buttocks and perineum. 5. Soak in the water for 15-20 minutes, or as long as told by your health care provider. 6. After the sitz bath, pat yourself dry. Do not rub your skin to dry it. 7. Clean and dry the basin between uses. 8. Discard the basin  if it cracks, or according to the manufacturer's instructions. Contact a health care provider if:  Your symptoms get worse. Do not continue with sitz baths if your symptoms get worse.  You have new symptoms. If this happens, do not continue with sitz baths until you talk with your health care provider. Summary  A sitz bath is a warm water bath in which the water only comes up to your hips and covers your buttocks.  A sitz bath may help relieve itching, relieve pain, and relax muscles that are sore or tight in the lower part of your body, including your genital area.  Take 3-4 sitz baths a day, or as many as told by your health care provider. Soak in the water for 15-20 minutes.  Do not continue with sitz baths if your symptoms get worse. This information is not intended to replace advice given to you by your health care provider. Make sure you discuss any questions you have with your health care provider. Document Released: 12/09/2003 Document Revised: 03/20/2017 Document Reviewed: 03/20/2017 Elsevier Patient Education  2020 [employer].

## 2019-01-22 DIAGNOSIS — M21961 Unspecified acquired deformity of right lower leg: Secondary | ICD-10-CM | POA: Diagnosis not present

## 2019-01-22 DIAGNOSIS — M21962 Unspecified acquired deformity of left lower leg: Secondary | ICD-10-CM | POA: Diagnosis not present

## 2019-01-22 DIAGNOSIS — B351 Tinea unguium: Secondary | ICD-10-CM | POA: Diagnosis not present

## 2019-01-22 DIAGNOSIS — M205X2 Other deformities of toe(s) (acquired), left foot: Secondary | ICD-10-CM | POA: Diagnosis not present

## 2019-01-22 DIAGNOSIS — M2042 Other hammer toe(s) (acquired), left foot: Secondary | ICD-10-CM | POA: Diagnosis not present

## 2019-01-22 NOTE — Telephone Encounter (Signed)
I called and spoke with pt. She is aware to give the metformin a try and let us know how it goes.

## 2019-01-22 NOTE — Telephone Encounter (Signed)
Metformin is the best least expensive medicine that we've got for diabetes. Lets try that first.

## 2019-01-24 ENCOUNTER — Other Ambulatory Visit: Payer: Self-pay

## 2019-01-24 ENCOUNTER — Encounter (HOSPITAL_COMMUNITY): Payer: Self-pay | Admitting: Emergency Medicine

## 2019-01-24 ENCOUNTER — Emergency Department (HOSPITAL_COMMUNITY)
Admission: EM | Admit: 2019-01-24 | Discharge: 2019-01-24 | Disposition: A | Discharge status: Home / Self Care | Payer: BC Managed Care – PPO | Attending: Emergency Medicine | Admitting: Emergency Medicine

## 2019-01-24 DIAGNOSIS — F17210 Nicotine dependence, cigarettes, uncomplicated: Secondary | ICD-10-CM | POA: Diagnosis not present

## 2019-01-24 DIAGNOSIS — K644 Residual hemorrhoidal skin tags: Secondary | ICD-10-CM | POA: Insufficient documentation

## 2019-01-24 DIAGNOSIS — I10 Essential (primary) hypertension: Secondary | ICD-10-CM | POA: Insufficient documentation

## 2019-01-24 DIAGNOSIS — Z79899 Other long term (current) drug therapy: Secondary | ICD-10-CM | POA: Insufficient documentation

## 2019-01-24 DIAGNOSIS — Z7984 Long term (current) use of oral hypoglycemic drugs: Secondary | ICD-10-CM | POA: Insufficient documentation

## 2019-01-24 DIAGNOSIS — K6289 Other specified diseases of anus and rectum: Secondary | ICD-10-CM | POA: Insufficient documentation

## 2019-01-24 DIAGNOSIS — E119 Type 2 diabetes mellitus without complications: Secondary | ICD-10-CM | POA: Diagnosis not present

## 2019-01-24 MED ORDER — HYDROCORT-PRAMOXINE (PERIANAL) 2.5-1 % EX CREA
TOPICAL_CREAM | Freq: Once | CUTANEOUS | Status: DC
  Filled 2019-01-24: qty 30

## 2019-01-24 MED ORDER — ACETAMINOPHEN 500 MG PO TABS
1000.0000 mg | ORAL_TABLET | Freq: Once | ORAL | Status: DC
  Filled 2019-01-24: qty 2

## 2019-01-24 MED ORDER — HYDROCORTISONE ACETATE 25 MG RE SUPP
25.0000 mg | Freq: Two times a day (BID) | RECTAL | Status: DC
  Filled 2019-01-24: qty 1

## 2019-01-24 NOTE — Discharge Instructions (Addendum)
It was our pleasure to provide your ER care today - we hope that you feel better.  Take acetaminophen and/or ibuprofen as need for pain.   Complete the full course of your antibiotics.   Try anusol cream or Preparation H with lidocaine cream for symptom relief.   If constipated, take colace (stool softener) 2x/day, and use miralax (laxative) as need.   Follow up with your doctor/gi doctor in the coming week - call office tomorrow AM to arrange appointment.   Return to ER if worse, new symptoms, high fevers, increased swelling, spreading redness, intractable pain, or other concern.

## 2019-01-24 NOTE — ED Provider Notes (Signed)
[facility] DEPT Provider Note   CSN: [id number] Arrival date & time: 01/24/19  1946     History   Chief Complaint Chief Complaint  Patient presents with  . Hemorrhoids    HPI Alisha Espinoza is a 53 y.o. female.     Patient c/o pain to anal area for the past week. States symptoms acute onset, moderate, constant, persistent, localized to anus/immediate peri-anal area.  States hx intermittent chronic constipation, did have bm this AM, normal. Denies abdominal or pelvic pain. Denies prior hx hemorrhoid although states pcp is treating her for possible hemorrhoid pain. Denies fever or chills. No rectal bleeding.   The history is provided by the patient.    Past Medical History:  Diagnosis Date  . Bronchitis   . Diabetes mellitus ([place]) 01/19/2019  . Dyspnea   . Heart murmur    resolved "closed by age 6."   . Hyperlipidemia   . Seasonal allergies     Patient Active Problem List   Diagnosis Date Noted  . Hemorrhoids 01/21/2019  . Essential hypertension 01/21/2019  . Serum calcium elevated 01/21/2019  . Elevated cholesterol 01/19/2019  . Diabetes mellitus ([place]) 01/19/2019  . 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 Acute bronchitis 08/08/2015  . Leukocytosis 08/08/2015  . Hyperglycemia 08/08/2015  . Tobacco abuse 08/08/2015  . 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 BACTERIAL PNEUMONIA, RIGHT LOWER LOBE 04/27/2008  . LUMP OR MASS IN BREAST 04/27/2008  . COUGH 04/27/2008    Past Surgical History:  Procedure Laterality Date  . ABDOMINAL HYSTERECTOMY  2000  . ANTERIOR CERVICAL DECOMP/DISCECTOMY FUSION  03/19/2012   Procedure: ANTERIOR CERVICAL DECOMPRESSION/DISCECTOMY FUSION 1 LEVEL;  Surgeon: Melina Schools, MD;  Location: [facility];  Service: Orthopedics;  Laterality: Left;  Total Disc Replacement C4-5  . ANTERIOR CERVICAL DECOMP/DISCECTOMY FUSION N/A 05/07/2012   Procedure: ANTERIOR CERVICAL DECOMPRESSION/DISCECTOMY FUSION 1  LEVEL/HARDWARE REMOVAL;  Surgeon: Melina Schools, MD;  Location: [facility];  Service: Orthopedics;  Laterality: N/A;  REMOVAL OF CERVICAL DISC REPLACEMENT AND ACDF C4-5  . BACK SURGERY    . CARPAL TUNNEL RELEASE Right 09/28/2018   Procedure: CARPAL TUNNEL RELEASE;  Surgeon: Eustace Moore, MD;  Location: [facility];  Service: Neurosurgery;  Laterality: Right;  CARPAL TUNNEL RELEASE  . CERVICAL FUSION  05/07/2012   Dr Rolena Infante  . COLONOSCOPY    . CYSTECTOMY     L wrist  . POLYPECTOMY    . ROTATOR CUFF REPAIR     Right  . TYMPANOSTOMY TUBE PLACEMENT       OB History   No obstetric history on file.      Home Medications    Prior to Admission medications   Medication Sig Start Date End Date Taking? Authorizing Provider  albuterol (PROVENTIL HFA;VENTOLIN HFA) 108 (90 Base) MCG/ACT inhaler Inhale 1-2 puffs into the lungs every 4 (four) hours as needed for wheezing or shortness of breath (or coughing). [id number]  Yes Delora Fuel, MD  ciprofloxacin (CIPRO) 500 MG tablet Take 1 tablet (500 mg total) by mouth every 12 (twelve) hours. 01/19/19  Yes Ocie Cornfield T, PA-C  docusate sodium (COLACE) 100 MG capsule Take 1 capsule (100 mg total) by mouth 2 (two) times daily. 01/21/19  Yes Libby Maw, MD  metFORMIN (GLUCOPHAGE) 500 MG tablet Take 1 tablet (500 mg total) by mouth 2 (two) times daily with a meal. 01/21/19  Yes Libby Maw, MD  metroNIDAZOLE (FLAGYL) 500 MG tablet Take 1 tablet (500  mg total) by mouth 3 (three) times daily. DO NOT CONSUME ALCOHOL WHILE TAKING THIS MEDICATION. 01/19/19  Yes Ocie Cornfield T, PA-C  naproxen sodium (ANAPROX) 550 MG tablet Take 1 tablet by mouth 2 (two) times daily. 01/18/19  Yes [provider]  oxyCODONE (OXY IR/ROXICODONE) 5 MG immediate release tablet Take 1 tablet by mouth every 6 (six) hours as needed. 12/03/18  Yes [provider]  simvastatin (ZOCOR) 20 MG tablet Take 20 mg by mouth daily. 01/26/18  Yes [provider]  tiZANidine (ZANAFLEX) 4 MG tablet Take 1 tablet by mouth every 8 (eight) hours as needed. 01/04/19  Yes [provider]  TRELEGY ELLIPTA 100-62.5-25 MCG/INH AEPB Inhale 1 puff into the lungs daily. 01/04/19  Yes [provider]  hydrocortisone (ANUSOL-HC) 2.5 % rectal cream Place 1 application rectally daily for 5 days. Patient not taking: Reported on 01/24/2019 01/19/19 01/24/19  Doristine Devoid, PA-C  witch hazel-glycerin (TUCKS) pad Apply 1 application topically as needed for itching. Patient not taking: Reported on 01/24/2019 01/21/19   Libby Maw, MD    Family History Family History  Problem Relation Age of Onset  . Diabetes Mother   . Hypertension Father   . Pancreatic cancer Sister   . Colon cancer Neg Hx   . Rectal cancer Neg Hx   . Stomach cancer Neg Hx   . Colon polyps Neg Hx   . Esophageal cancer Neg Hx     Social History Social History   Tobacco Use  . Smoking status: Current Every Day Smoker    Packs/day: 0.50    Years: 30.00    Pack years: 15.00    Types: Cigarettes  . Smokeless tobacco: Never Used  Substance Use Topics  . Alcohol use: Yes    Comment: 1-2 beers on occsion.   . Drug use: No     Allergies   Meloxicam   Review of Systems Review of Systems  Constitutional: Negative for chills and fever.  Respiratory: Negative for shortness of breath.   Cardiovascular: Negative for chest pain.  Gastrointestinal: Negative for abdominal pain, diarrhea and vomiting.  Genitourinary: Negative for flank pain.  Musculoskeletal: Negative for back pain.  Skin: Negative for rash.  Hematological: Does not bruise/bleed easily.     Physical Exam Updated Vital Signs BP 109/72 (BP Location: Left Arm)   Pulse (!) 103   Temp 98.6 F (37 C) (Oral)   Resp 16   Ht 1.676 m (5\' 6" )   Wt 89.8 kg   SpO2 97%   BMI 31.96 kg/m   Physical Exam Vitals signs and nursing note reviewed.  Constitutional:      Appearance:  Normal appearance. She is well-developed.  HENT:     Head: Atraumatic.     Nose: Nose normal.     Mouth/Throat:     Mouth: Mucous membranes are moist.  Eyes:     General: No scleral icterus.    Conjunctiva/sclera: Conjunctivae normal.  Neck:     Musculoskeletal: Neck supple.     Trachea: No tracheal deviation.  Cardiovascular:     Rate and Rhythm: Normal rate.     Pulses: Normal pulses.  Pulmonary:     Effort: Pulmonary effort is normal. No respiratory distress.  Abdominal:     General: Bowel sounds are normal. There is no distension.     Palpations: Abdomen is soft.     Tenderness: There is no abdominal tenderness.  Genitourinary:    Comments: No cva  tenderness. Small external hemorrhoid, not acutely thrombosed. Patient tolerates attempt at rectal exam very poorly, repetitively pushing hand away, not permitting any internal exam. No obvious perirectal abscess seen or felt. No cellulitis. No rectal discharge. No bleeding noted.  Musculoskeletal:        General: No swelling.  Skin:    General: Skin is warm and dry.     Findings: No rash.  Neurological:     Mental Status: She is alert.     Comments: Alert, speech normal.   Psychiatric:        Mood and Affect: Mood normal.      ED Treatments / Results  Labs (all labs ordered are listed, but only abnormal results are displayed) Labs Reviewed - No data to display  EKG None  Radiology No results found.  Procedures Procedures (including critical care time)  Medications Ordered in ED Medications  hydrocortisone (ANUSOL-HC) suppository 25 mg (has no administration in time range)  acetaminophen (TYLENOL) tablet 1,000 mg (has no administration in time range)     Initial Impression / Assessment and Plan / ED Course  I have reviewed the triage vital signs and the nursing notes.  Pertinent labs & imaging results that were available during my care of the patient were reviewed by me and considered in my medical decision  making (see chart for details).  No meds pta.   Pt drove self.   Acetaminophen po. anusol suppository.   Reviewed nursing notes and prior charts for additional history. Pt with visit to pcp, and ED prior for same - recent ct ?proctitis, no abscess. Patient has 4 days of her antibiotics left - encouraged to complete course.  rec anusol or prep h w lido for symptom relief. Given recurrent visits, discussed plan for outpt gi f/u.  Return precautions provided.       Final Clinical Impressions(s) / ED Diagnoses   Final diagnoses:  None    ED Discharge Orders    None       Lajean Saver, MD 01/24/19 2200

## 2019-01-24 NOTE — ED Triage Notes (Signed)
Patient is complaining of burning rectum. Patient states went to pcp on Monday and was given cream and antibiotics. Patient states she can not use the bathroom because it hurts so bad.

## 2019-01-25 ENCOUNTER — Other Ambulatory Visit: Payer: Self-pay

## 2019-01-25 ENCOUNTER — Ambulatory Visit (INDEPENDENT_AMBULATORY_CARE_PROVIDER_SITE_OTHER): Payer: BC Managed Care – PPO | Admitting: Family Medicine

## 2019-01-25 ENCOUNTER — Encounter: Payer: Self-pay | Admitting: Family Medicine

## 2019-01-25 ENCOUNTER — Telehealth: Payer: Self-pay | Admitting: Family Medicine

## 2019-01-25 ENCOUNTER — Other Ambulatory Visit: Payer: Self-pay | Admitting: Family Medicine

## 2019-01-25 VITALS — BP 112/72 | HR 98 | Temp 97.9°F | Ht 66.0 in | Wt 198.8 lb

## 2019-01-25 DIAGNOSIS — K6289 Other specified diseases of anus and rectum: Secondary | ICD-10-CM | POA: Diagnosis not present

## 2019-01-25 DIAGNOSIS — K649 Unspecified hemorrhoids: Secondary | ICD-10-CM | POA: Diagnosis not present

## 2019-01-25 MED ORDER — RECTIV 0.4 % RE OINT: TOPICAL_OINTMENT | RECTAL | 0 refills | Status: DC

## 2019-01-25 MED ORDER — NITROGLYCERIN 2 % TD OINT: TOPICAL_OINTMENT | TRANSDERMAL | 0 refills | Status: DC

## 2019-01-25 NOTE — Telephone Encounter (Signed)
I can write rx to have some compounded at a local pharmacy.  Also please call current pharmacy and cancel rx for NTG 2% ointment as it was accidentally sent in while searching for alternatives.

## 2019-01-25 NOTE — Telephone Encounter (Signed)
Pt stated her insurance will not cover Nitroglycerin (RECTIV) 0.4 % OINT It costs $800. She would like to know if there is an alternative that can be sent in.

## 2019-01-25 NOTE — Patient Instructions (Signed)
Hemorrhoids Hemorrhoids are swollen veins that may develop:  In the butt (rectum). These are called internal hemorrhoids.  Around the opening of the butt (anus). These are called external hemorrhoids. Hemorrhoids can cause pain, itching, or bleeding. Most of the time, they do not cause serious problems. They usually get better with diet changes, lifestyle changes, and other home treatments. What are the causes? This condition may be caused by:  Having trouble pooping (constipation).  Pushing hard (straining) to poop.  Watery poop (diarrhea).  Pregnancy.  Being very overweight (obese).  Sitting for long periods of time.  Heavy lifting or other activity that causes you to strain.  Anal sex.  Riding a bike for a long period of time. What are the signs or symptoms? Symptoms of this condition include:  Pain.  Itching or soreness in the butt.  Bleeding from the butt.  Leaking poop.  Swelling in the area.  One or more lumps around the opening of your butt. How is this diagnosed? A doctor can often diagnose this condition by looking at the affected area. The doctor may also:  Do an exam that involves feeling the area with a gloved hand (digital rectal exam).  Examine the area inside your butt using a small tube (anoscope).  Order blood tests. This may be done if you have lost a lot of blood.  Have you get a test that involves looking inside the colon using a flexible tube with a camera on the end (sigmoidoscopy or colonoscopy). How is this treated? This condition can usually be treated at home. Your doctor may tell you to change what you eat, make lifestyle changes, or try home treatments. If these do not help, procedures can be done to remove the hemorrhoids or make them smaller. These may involve:  Placing rubber bands at the base of the hemorrhoids to cut off their blood supply.  Injecting medicine into the hemorrhoids to shrink them.  Shining a type of light  energy onto the hemorrhoids to cause them to fall off.  Doing surgery to remove the hemorrhoids or cut off their blood supply. Follow these instructions at home: Eating and drinking   Eat foods that have a lot of fiber in them. These include whole grains, beans, nuts, fruits, and vegetables.  Ask your doctor about taking products that have added fiber (fibersupplements).  Reduce the amount of fat in your diet. You can do this by: ? Eating low-fat dairy products. ? Eating less red meat. ? Avoiding processed foods.  Drink enough fluid to keep your pee (urine) pale yellow. Managing pain and swelling   Take a warm-water bath (sitz bath) for 20 minutes to ease pain. Do this 3-4 times a day. You may do this in a bathtub or using a portable sitz bath that fits over the toilet.  If told, put ice on the painful area. It may be helpful to use ice between your warm baths. ? Put ice in a plastic bag. ? Place a towel between your skin and the bag. ? Leave the ice on for 20 minutes, 2-3 times a day. General instructions  Take over-the-counter and prescription medicines only as told by your doctor. ? Medicated creams and medicines may be used as told.  Exercise often. Ask your doctor how much and what kind of exercise is best for you.  Go to the bathroom when you have the urge to poop. Do not wait.  Avoid pushing too hard when you poop.  Keep your   butt dry and clean. Use wet toilet paper or moist towelettes after pooping.  Do not sit on the toilet for a long time.  Keep all follow-up visits as told by your doctor. This is important. Contact a doctor if you:  Have pain and swelling that do not get better with treatment or medicine.  Have trouble pooping.  Cannot poop.  Have pain or swelling outside the area of the hemorrhoids. Get help right away if you have:  Bleeding that will not stop. Summary  Hemorrhoids are swollen veins in the butt or around the opening of the butt.   They can cause pain, itching, or bleeding.  Eat foods that have a lot of fiber in them. These include whole grains, beans, nuts, fruits, and vegetables.  Take a warm-water bath (sitz bath) for 20 minutes to ease pain. Do this 3-4 times a day. This information is not intended to replace advice given to you by your health care provider. Make sure you discuss any questions you have with your health care provider. Document Released: 12/26/2007 Document Revised: 03/26/2018 Document Reviewed: 08/07/2017 Elsevier Patient Education  2020 Elsevier Inc.  

## 2019-01-25 NOTE — Telephone Encounter (Signed)
Okay I called and had it deleted.

## 2019-01-25 NOTE — Progress Notes (Signed)
Alisha Espinoza - 53 y.o. female MRN [id number]  Date of birth: Dec 24, 1965  Subjective Chief Complaint  Patient presents with  . Hemorrhoids    Pt been having hemorrhoids for about a week. barely can sit.    HPI Alisha Espinoza is a 53 y.o. female here today with complaint of anal/rectal pain.  She has had this for ~1 week.  She was seen in ED on 01/19/2019 and had CT showing findings proctitis.  She was started on anusol, cipro an flagyl for this.  She continued to have pain and was seen by Dr. Ethelene Hal on 01/21/2019 and was noted to have non-thrombosed but irritated hemorrhoids.  She was prescribed Tucks pads and colace.  She was subsequently seen in the ED yesterday for continued pain and recommended to complete abx and continue conservative treatment.  She reports that today that pain is making it difficult for her to sit. She has not gotten significant relief from any prescribed treatments.  She does have some constipation.  She denies anal discharge or blood in her stool.  She has not had fever or chills.    ROS:  A comprehensive ROS was completed and negative except as noted per HPI  Allergies  Allergen Reactions  . Meloxicam Hives    Past Medical History:  Diagnosis Date  . Bronchitis   . Diabetes mellitus ([place]) 01/19/2019  . Dyspnea   . Heart murmur    resolved "closed by age 30."   . Hyperlipidemia   . Seasonal allergies     Past Surgical History:  Procedure Laterality Date  . ABDOMINAL HYSTERECTOMY  2000  . ANTERIOR CERVICAL DECOMP/DISCECTOMY FUSION  03/19/2012   Procedure: ANTERIOR CERVICAL DECOMPRESSION/DISCECTOMY FUSION 1 LEVEL;  Surgeon: Melina Schools, MD;  Location: [facility];  Service: Orthopedics;  Laterality: Left;  Total Disc Replacement C4-5  . ANTERIOR CERVICAL DECOMP/DISCECTOMY FUSION N/A 05/07/2012   Procedure: ANTERIOR CERVICAL DECOMPRESSION/DISCECTOMY FUSION 1 LEVEL/HARDWARE REMOVAL;  Surgeon: Melina Schools, MD;  Location: [facility];  Service: Orthopedics;   Laterality: N/A;  REMOVAL OF CERVICAL DISC REPLACEMENT AND ACDF C4-5  . BACK SURGERY    . CARPAL TUNNEL RELEASE Right 09/28/2018   Procedure: CARPAL TUNNEL RELEASE;  Surgeon: Eustace Moore, MD;  Location: [facility];  Service: Neurosurgery;  Laterality: Right;  CARPAL TUNNEL RELEASE  . CERVICAL FUSION  05/07/2012   Dr Rolena Infante  . COLONOSCOPY    . CYSTECTOMY     L wrist  . POLYPECTOMY    . ROTATOR CUFF REPAIR     Right  . TYMPANOSTOMY TUBE PLACEMENT      Social History   Socioeconomic History  . Marital status: Divorced    Spouse name: Not on file  . Number of children: Not on file  . Years of education: Not on file  . Highest education level: Not on file  Occupational History  . Not on file  Social Needs  . Financial resource strain: Not on file  . Food insecurity    Worry: Not on file    Inability: Not on file  . Transportation needs    Medical: Not on file    Non-medical: Not on file  Tobacco Use  . Smoking status: Current Every Day Smoker    Packs/day: 0.50    Years: 30.00    Pack years: 15.00    Types: Cigarettes  . Smokeless tobacco: Never Used  Substance and Sexual Activity  . Alcohol use: Yes    Comment: 1-2 beers on  occsion.   . Drug use: No  . Sexual activity: Yes    Birth control/protection: Surgical    Comment: Hysterectomy  Lifestyle  . Physical activity    Days per week: Not on file    Minutes per session: Not on file  . Stress: Not on file  Relationships  . Social Herbalist on phone: Not on file    Gets together: Not on file    Attends religious service: Not on file    Active member of club or organization: Not on file    Attends meetings of clubs or organizations: Not on file    Relationship status: Not on file  Other Topics Concern  . Not on file  Social History Narrative  . Not on file    Family History  Problem Relation Age of Onset  . Diabetes Mother   . Hypertension Father   . Pancreatic cancer Sister   . Colon cancer Neg  Hx   . Rectal cancer Neg Hx   . Stomach cancer Neg Hx   . Colon polyps Neg Hx   . Esophageal cancer Neg Hx     Health Maintenance  Topic Date Due  . FOOT EXAM  05/21/1975  . OPHTHALMOLOGY EXAM  05/21/1975  . HIV Screening  05/20/1980  . TETANUS/TDAP  05/20/1984  . PAP SMEAR-Modifier  05/20/1986  . HEMOGLOBIN A1C  07/21/2019  . URINE MICROALBUMIN  01/20/2020  . MAMMOGRAM  11/19/2020  . COLONOSCOPY  09/18/2023  . INFLUENZA VACCINE  Completed  . PNEUMOCOCCAL POLYSACCHARIDE VACCINE AGE 4-64 HIGH RISK  Completed    ----------------------------------------------------------------------------------------------------------------------------------------------------------------------------------------------------------------- Physical Exam BP 112/72   Pulse 98   Temp 97.9 F (36.6 C) (Oral)   Ht 5\' 6"  (1.676 m)   Wt 198 lb 12.8 oz (90.2 kg)   SpO2 99%   BMI 32.09 kg/m   Physical Exam Constitutional:      Appearance: Normal appearance.  HENT:     Head: Normocephalic and atraumatic.  Abdominal:     General: Abdomen is flat. There is no distension.     Tenderness: There is no abdominal tenderness. There is no guarding.  Genitourinary:    Comments: She has non-thrombosed external hemorrhoids.  These are tender to touch and she is unable to tolerate rectal exam.  There is no obvious fissure or abscess.  Skin:    General: Skin is warm and dry.  Neurological:     General: No focal deficit present.     Mental Status: She is alert.  Psychiatric:        Mood and Affect: Mood normal.        Behavior: Behavior normal.     ------------------------------------------------------------------------------------------------------------------------------------------------------------------------------------------------------------------- Assessment and Plan Hemorrhoids -She does have hemorrhoids however these are non-thrombosed.  ?fissure given degree of pain, also possible proctitis  noted on recent CT.  She will complete current antibiotics for this.  Unfortunately she refuses DRE and/or anoscopy.   Unable to tolerate lidocaine, burns too much. Will have her try NTG to see if this provides any relief for her and referral entered to GI.

## 2019-01-25 NOTE — Assessment & Plan Note (Addendum)
-  She does have hemorrhoids however these are non-thrombosed.  ?fissure given degree of pain, also possible proctitis noted on recent CT.  She will complete current antibiotics for this.  Unfortunately she refuses DRE and/or anoscopy.   Unable to tolerate lidocaine, burns too much. Will have her try NTG to see if this provides any relief for her and referral entered to GI.

## 2019-01-26 DIAGNOSIS — M5416 Radiculopathy, lumbar region: Secondary | ICD-10-CM | POA: Diagnosis not present

## 2019-01-26 NOTE — Telephone Encounter (Signed)
Patient said she's okay for now she's going check in with the pharmacy and provide them with her new insurance card if she have any questions she will callback.

## 2019-01-26 NOTE — Telephone Encounter (Signed)
Does she want compounded medication?  She would have to go to a specialty pharmacy such as [facility] on [address], [facility] [place] or [facility].

## 2019-01-27 ENCOUNTER — Encounter: Payer: Self-pay | Admitting: Physician Assistant

## 2019-01-27 ENCOUNTER — Ambulatory Visit (INDEPENDENT_AMBULATORY_CARE_PROVIDER_SITE_OTHER): Payer: BC Managed Care – PPO | Admitting: Physician Assistant

## 2019-01-27 ENCOUNTER — Other Ambulatory Visit: Payer: Self-pay

## 2019-01-27 VITALS — BP 100/70 | HR 80 | Temp 98.1°F | Ht 65.0 in | Wt 199.4 lb

## 2019-01-27 DIAGNOSIS — K602 Anal fissure, unspecified: Secondary | ICD-10-CM

## 2019-01-27 DIAGNOSIS — K6289 Other specified diseases of anus and rectum: Secondary | ICD-10-CM

## 2019-01-27 MED ORDER — AMBULATORY NON FORMULARY MEDICATION: 3 refills | Status: DC

## 2019-01-27 NOTE — Progress Notes (Signed)
Subjective:    Patient ID: Alisha Espinoza, female    DOB: [birth year], 53 y.o.   MRN: [id number]  HPI [name] is a pleasant 53 year old African-American female, known to Dr. Silverio Decamp  who comes in today with complaints of anal rectal pain. She has history of hypertension, adult onset diabetes mellitus, and is status post prior lumbar fusion. She had undergone colonoscopy in June 2020 for history of adenomatous colon polyps.  She was found to have scattered diverticulosis and 2 small polyps were removed, also noted medium sized internal hemorrhoids.  Path on the polyps, one was a tubular adenoma and the other hyperplastic. Comes in today with acute symptoms x1 week with constant anal rectal pain.  She says she gets increasing pain and burning with a bowel movement.  She feels that she has a knot or bump on the outside that is tender.  She has not noticed any bleeding.  She is uncomfortable sitting.  She relates ongoing issues with constipation, usually uses Correctol as needed. Over the past week she has been soaking in the tub, using a stool softener and Preparation H but has not had any improvement in symptoms.  Review of Systems Pertinent positive and negative review of systems were noted in the above HPI section.  All other review of systems was otherwise negative.  Outpatient Encounter Medications as of 01/27/2019  Medication Sig  . albuterol (PROVENTIL HFA;VENTOLIN HFA) 108 (90 Base) MCG/ACT inhaler Inhale 1-2 puffs into the lungs every 4 (four) hours as needed for wheezing or shortness of breath (or coughing).  . metFORMIN (GLUCOPHAGE) 500 MG tablet Take 1 tablet (500 mg total) by mouth 2 (two) times daily with a meal.  . naproxen sodium (ANAPROX) 550 MG tablet Take 1 tablet by mouth 2 (two) times daily.  Marland Kitchen oxyCODONE (OXY IR/ROXICODONE) 5 MG immediate release tablet Take 1 tablet by mouth every 6 (six) hours as needed.  . simvastatin (ZOCOR) 20 MG tablet Take 20 mg by mouth daily.  Marland Kitchen  tiZANidine (ZANAFLEX) 4 MG tablet Take 1 tablet by mouth every 8 (eight) hours as needed.  . TRELEGY ELLIPTA 100-62.5-25 MCG/INH AEPB Inhale 1 puff into the lungs daily.  . AMBULATORY NON FORMULARY MEDICATION Medication Name: Diltiazem Ointment 2%/ Lidocaine 5% Insert 1 inch into anus four times daily  . [DISCONTINUED] ciprofloxacin (CIPRO) 500 MG tablet Take 1 tablet (500 mg total) by mouth every 12 (twelve) hours.  . [DISCONTINUED] docusate sodium (COLACE) 100 MG capsule Take 1 capsule (100 mg total) by mouth 2 (two) times daily.  . [DISCONTINUED] metroNIDAZOLE (FLAGYL) 500 MG tablet Take 1 tablet (500 mg total) by mouth 3 (three) times daily. DO NOT CONSUME ALCOHOL WHILE TAKING THIS MEDICATION.  . [DISCONTINUED] Nitroglycerin (RECTIV) 0.4 % OINT Apply small amount around anal area twice daily. (Patient not taking: Reported on 01/27/2019)  . [DISCONTINUED] witch hazel-glycerin (TUCKS) pad Apply 1 application topically as needed for itching.   No facility-administered encounter medications on file as of 01/27/2019.    Allergies  Allergen Reactions  . Meloxicam Hives  . Mobic [Meloxicam]     Other reaction(s): Hives   Patient Active Problem List   Diagnosis Date Noted  . Hemorrhoids 01/21/2019  . Essential hypertension 01/21/2019  . Serum calcium elevated 01/21/2019  . Elevated cholesterol 01/19/2019  . Diabetes mellitus ([place]) 01/19/2019  . 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 Acute bronchitis 08/08/2015  . Leukocytosis 08/08/2015  . Hyperglycemia 08/08/2015  . Tobacco abuse 08/08/2015  . OTHER CHRONIC  INFECTIVE OTITIS EXTERNA 04/27/2008  . BACTERIAL PNEUMONIA, RIGHT LOWER LOBE 04/27/2008  . LUMP OR MASS IN BREAST 04/27/2008  . COUGH 04/27/2008   Social History   Socioeconomic History  . Marital status: Divorced    Spouse name: Not on file  . Number of children: Not on file  . Years of education: Not on file  . Highest education level: Not on file  Occupational History  . Not on  file  Social Needs  . Financial resource strain: Not on file  . Food insecurity    Worry: Not on file    Inability: Not on file  . Transportation needs    Medical: Not on file    Non-medical: Not on file  Tobacco Use  . Smoking status: Current Every Day Smoker    Packs/day: 0.50    Years: 30.00    Pack years: 15.00    Types: Cigarettes  . Smokeless tobacco: Never Used  Substance and Sexual Activity  . Alcohol use: Yes    Comment: 1-2 beers on occsion.   . Drug use: No  . Sexual activity: Yes    Birth control/protection: Surgical    Comment: Hysterectomy  Lifestyle  . Physical activity    Days per week: Not on file    Minutes per session: Not on file  . Stress: Not on file  Relationships  . Social Herbalist on phone: Not on file    Gets together: Not on file    Attends religious service: Not on file    Active member of club or organization: Not on file    Attends meetings of clubs or organizations: Not on file    Relationship status: Not on file  . Intimate partner violence    Fear of current or ex partner: Not on file    Emotionally abused: Not on file    Physically abused: Not on file    Forced sexual activity: Not on file  Other Topics Concern  . Not on file  Social History Narrative  . Not on file    Ms. Toft's family history includes Diabetes in her mother; Hypertension in her father; Pancreatic cancer in her sister.      Objective:    Vitals:   01/27/19 1103  BP: 100/70  Pulse: 80  Temp: 98.1 F (36.7 C)    Physical Exam Well-developed well-nourished African-American female in no acute distress.  Height, Weight, 199 BMI 33.1 -uncomfortable appearing  HEENT; nontraumatic normocephalic, EOMI, PE RR LA, sclera anicteric. Oropharynx; not examined/mask/Covid Neck; supple, no JVD Cardiovascular; regular rate and rhythm with S1-S2, no murmur rub or gallop Pulmonary; Clear bilaterally Abdomen; soft, nontender, nondistended, no palpable  mass or hepatosplenomegaly, bowel sounds are active Rectal; mildly edematous small external hemorrhoid, not thrombosed, she is exquisitely tender with attempt at rectal exam and did not pursue further Skin; benign exam, no jaundice rash or appreciable lesions Extremities; no clubbing cyanosis or edema skin warm and dry Neuro/Psych; alert and oriented x4, grossly nonfocal mood and affect appropriate       Assessment & Plan:   #75 53 year old African-American female with 1 week history of constant anal rectal pain, worse with bowel movements. Recent colonoscopy documented medium sized internal hemorrhoids.  Patient was exquisitely tender to exam today and would not allow rectal exam, she has a small mildly edematous external hemorrhoid.  I suspect she has an anal fissure, but cannot prove as she would not allow digital exam.  #2  history of adenomatous colon polyps-up-to-date with colonoscopy just done June 2020, indicated for 5-year interval follow-up  Plan; start MiraLAX 17 g in 8 ounces of water daily Start diltiazem 2%/compounded with lidocaine 5%, apply 1/2 to 1 inch inside the anus 4 times daily until symptoms completely resolve. Also advised to try RectiCare advanced over-the-counter 3-4 times daily over the next 7 to 10 days. May continue soaking in tub of hot water twice daily. We discussed the slow nature of healing of anal fissures and she was advised this may take multiple weeks to completely resolve.  She is asked to call back in 2 weeks if she has not had any significant improvement in symptoms.  Lavonya Hoerner Genia Harold PA-C 01/27/2019   Cc: Libby Maw,*

## 2019-01-27 NOTE — Patient Instructions (Addendum)
We have given you a printed prescription for Diltiazem gel to [facility].       [facility]'s information is below: Address: [address], [place], [place] [address]  [phone]  *Please DO NOT go directly from our office to pick up this medication! Give the pharmacy 1 day to process the prescription as this is compounded and takes time to make.  Start Miralax 17g in [place] of water daily     Recticare Advanced OTC to apply to external area  Call back in 2 weeks if not better, may need to repeat treatment for another 6 weeks  Soak in tub twice daily  If you are age 70 or older, your body mass index should be between 23-30. Your Body mass index is 33.18 kg/m. If this is out of the aforementioned range listed, please consider follow up with your Primary Care Provider.  If you are age 8 or younger, your body mass index should be between 19-25. Your Body mass index is 33.18 kg/m. If this is out of the aformentioned range listed, please consider follow up with your Primary Care Provider.    Anal Fissure, Adult  An anal fissure is a small tear or crack in the tissue around the opening of the butt (anus). Bleeding from the tear or crack usually stops on its own within a few minutes. The bleeding may happen every time you poop (have a bowel movement) until the tear or crack heals. What are the causes? This condition is usually caused by passing a large or hard poop (stool). Other causes include:  Trouble pooping (constipation).  Passing watery poop (diarrhea).  Inflammatory bowel disease (Crohn's disease or ulcerative colitis).  Childbirth.  Infections.  Anal sex. What are the signs or symptoms? Symptoms of this condition include:  Bleeding from the butt.  Small amounts of blood on your poop. The blood coats the outside of the poop. It is not mixed with the poop.  Small amounts of blood on the toilet paper or in the toilet after you poop.  Pain  when passing poop.  Itching or irritation around the opening of the butt. How is this diagnosed? This condition may be diagnosed based on a physical exam. Your doctor may:  Check your butt. A tear can often be seen by checking the area with care.  Check your butt using a short tube (anoscope). The light in the tube will show any problems in your butt. How is this treated? Treatment for this condition may include:  Treating problems that make it hard for you to pass poop. You may be told to: ? Eat more fiber. ? Drink more fluid. ? Take fiber supplements. ? Take medicines that make poop soft.  Taking sitz baths. This may help to heal the tear.  Using creams and ointments. If your condition gets worse, other treatments may be needed such as:  A shot near the tear or crack (botulinum injection).  Surgery to repair the tear or crack. Follow these instructions at home: Eating and drinking   Avoid bananas and dairy products. These foods can make it hard to poop.  Drink enough fluid to keep your pee (urine) pale yellow.  Eat foods that have a lot of fiber in them, such as: ? Beans. ? Whole grains. ? Fresh fruits. ? Fresh vegetables. General instructions   Take over-the-counter and prescription medicines only as told by your doctor.  Use creams or ointments only as told by your  doctor.  Keep the butt area as clean and dry as you can.  Take a warm water bath (sitz bath) as told by your doctor. Do not use soap.  Keep all follow-up visits as told by your doctor. This is important. Contact a doctor if:  You have more bleeding.  You have a fever.  You have watery poop that is mixed with blood.  You have pain.  Your problem gets worse, not better. Summary  An anal fissure is a small tear or crack in the skin around the opening of the butt (anus).  This condition is usually caused by passing a large or hard poop (stool).  Treatment includes treating the problems  that make it hard for you to pass poop.  Follow your doctor's instructions about caring for your condition at home.  Keep all follow-up visits as told by your doctor. This is important. This information is not intended to replace advice given to you by your health care provider. Make sure you discuss any questions you have with your health care provider. Document Released: 11/14/2010 Document Revised: 08/28/2017 Document Reviewed: 08/28/2017 Elsevier Patient Education  [employer].    I appreciate the  opportunity to care for you  Thank You   Amy [employer]

## 2019-01-31 ENCOUNTER — Emergency Department (HOSPITAL_COMMUNITY)
Admission: EM | Admit: 2019-01-31 | Discharge: 2019-01-31 | Disposition: A | Discharge status: Home / Self Care | Payer: BC Managed Care – PPO | Attending: Emergency Medicine | Admitting: Emergency Medicine

## 2019-01-31 ENCOUNTER — Other Ambulatory Visit: Payer: Self-pay

## 2019-01-31 ENCOUNTER — Encounter (HOSPITAL_COMMUNITY): Payer: Self-pay

## 2019-01-31 DIAGNOSIS — K6289 Other specified diseases of anus and rectum: Secondary | ICD-10-CM | POA: Diagnosis not present

## 2019-01-31 DIAGNOSIS — K644 Residual hemorrhoidal skin tags: Secondary | ICD-10-CM | POA: Insufficient documentation

## 2019-01-31 DIAGNOSIS — K602 Anal fissure, unspecified: Secondary | ICD-10-CM | POA: Insufficient documentation

## 2019-01-31 DIAGNOSIS — K629 Disease of anus and rectum, unspecified: Secondary | ICD-10-CM

## 2019-01-31 MED ORDER — LIDOCAINE-HYDROCORTISONE ACE 3-0.5 % RE KIT: 0.5000 [in_us] | PACK | RECTAL | 0 refills | Status: DC | PRN

## 2019-01-31 MED ORDER — NITROGLYCERIN 0.4 % RE OINT: 0.5000 [in_us] | TOPICAL_OINTMENT | RECTAL | 0 refills | Status: DC | PRN

## 2019-01-31 NOTE — ED Provider Notes (Signed)
[facility] EMERGENCY DEPARTMENT Provider Note   CSN: [id number] Arrival date & time: 01/31/19  1941     History   Chief Complaint Chief Complaint  Patient presents with  . Anal Fissure    HPI Alisha Espinoza is a 53 y.o. female presents to the ER for evaluation of anal discomfort described as burning.  Reports she has been having similar issues for at least 2 weeks.  Has been to the ER a couple of times for evaluation of this.  She also saw low-power gastroenterology who diagnosed her with an anal fissure.  She was prescribed diltiazem/lidocaine ointment which she has been using frequently throughout the day.  States symptoms were improving but yesterday had a bowel movement and had return of sharp pain in the anal area.  She woke up this morning and noticed "bumps" around the anal sphincter that are burning and uncomfortable.  She denies fever, nausea, vomiting, abdominal pain.  Is afraid to have a bowel movement due to the rectal pain she had yesterday but feels like she has to have a bowel movement.  No hematochezia.  No melena.  She tried to call GI tonight but was told there was no on-call doctor.  She has not been sexually active since 2016.  No history of herpes.  She has long history of constipation and states typically she has to take a laxative to induce a bowel movement.  Has been taking Metamucil and a stool softener daily.      HPI  Past Medical History:  Diagnosis Date  . Bronchitis   . Diabetes mellitus ([place]) 01/19/2019  . Dyspnea   . Heart murmur    resolved "closed by age 30."   . Hyperlipidemia   . Seasonal allergies     Patient Active Problem List   Diagnosis Date Noted  . Hemorrhoids 01/21/2019  . Essential hypertension 01/21/2019  . Serum calcium elevated 01/21/2019  . Elevated cholesterol 01/19/2019  . Diabetes mellitus ([place]) 01/19/2019  . 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 Acute bronchitis 08/08/2015  . Leukocytosis 08/08/2015  .  Hyperglycemia 08/08/2015  . Tobacco abuse 08/08/2015  . 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 BACTERIAL PNEUMONIA, RIGHT LOWER LOBE 04/27/2008  . LUMP OR MASS IN BREAST 04/27/2008  . COUGH 04/27/2008    Past Surgical History:  Procedure Laterality Date  . ABDOMINAL HYSTERECTOMY  2000  . ANTERIOR CERVICAL DECOMP/DISCECTOMY FUSION  03/19/2012   Procedure: ANTERIOR CERVICAL DECOMPRESSION/DISCECTOMY FUSION 1 LEVEL;  Surgeon: Melina Schools, MD;  Location: [facility];  Service: Orthopedics;  Laterality: Left;  Total Disc Replacement C4-5  . ANTERIOR CERVICAL DECOMP/DISCECTOMY FUSION N/A 05/07/2012   Procedure: ANTERIOR CERVICAL DECOMPRESSION/DISCECTOMY FUSION 1 LEVEL/HARDWARE REMOVAL;  Surgeon: Melina Schools, MD;  Location: [facility];  Service: Orthopedics;  Laterality: N/A;  REMOVAL OF CERVICAL DISC REPLACEMENT AND ACDF C4-5  . BACK SURGERY    . CARPAL TUNNEL RELEASE Right 09/28/2018   Procedure: CARPAL TUNNEL RELEASE;  Surgeon: Eustace Moore, MD;  Location: [facility];  Service: Neurosurgery;  Laterality: Right;  CARPAL TUNNEL RELEASE  . CERVICAL FUSION  05/07/2012   Dr Rolena Infante  . COLONOSCOPY    . CYSTECTOMY     L wrist  . POLYPECTOMY    . ROTATOR CUFF REPAIR     Right  . TYMPANOSTOMY TUBE PLACEMENT       OB History   No obstetric history on file.      Home Medications    Prior  to Admission medications   Medication Sig Start Date End Date Taking? Authorizing Provider  albuterol (PROVENTIL HFA;VENTOLIN HFA) 108 (90 Base) MCG/ACT inhaler Inhale 1-2 puffs into the lungs every 4 (four) hours as needed for wheezing or shortness of breath (or coughing). 4/58/09   Delora Fuel, MD  AMBULATORY NON FORMULARY MEDICATION Medication Name: Diltiazem Ointment 2%/ Lidocaine 5% Insert 1 inch into anus four times daily 01/27/19   Esterwood, Amy S, PA-C  Lidocaine-Hydrocortisone Ace 3-0.5 % KIT Place 0.5 inches rectally as needed (use pea sized amount in anus/around anus before a bowel movement).  01/31/19   Kinnie Feil, PA-C  metFORMIN (GLUCOPHAGE) 500 MG tablet Take 1 tablet (500 mg total) by mouth 2 (two) times daily with a meal. 01/21/19   Libby Maw, MD  naproxen sodium (ANAPROX) 550 MG tablet Take 1 tablet by mouth 2 (two) times daily. 01/18/19   [provider]  Nitroglycerin 0.4 % OINT Place 0.5 inches rectally as needed. Apply pea sized amount in/around anus prior to bowel movements 01/31/19   Kinnie Feil, PA-C  oxyCODONE (OXY IR/ROXICODONE) 5 MG immediate release tablet Take 1 tablet by mouth every 6 (six) hours as needed. 12/03/18   [provider]  simvastatin (ZOCOR) 20 MG tablet Take 20 mg by mouth daily. 01/26/18   [provider]  tiZANidine (ZANAFLEX) 4 MG tablet Take 1 tablet by mouth every 8 (eight) hours as needed. 01/04/19   [provider]  TRELEGY ELLIPTA 100-62.5-25 MCG/INH AEPB Inhale 1 puff into the lungs daily. 01/04/19   [provider]    Family History Family History  Problem Relation Age of Onset  . Diabetes Mother   . Hypertension Father   . Pancreatic cancer Sister   . Colon cancer Neg Hx   . Rectal cancer Neg Hx   . Stomach cancer Neg Hx   . Colon polyps Neg Hx   . Esophageal cancer Neg Hx     Social History Social History   Tobacco Use  . Smoking status: Current Every Day Smoker    Packs/day: 0.50    Years: 30.00    Pack years: 15.00    Types: Cigarettes  . Smokeless tobacco: Never Used  Substance Use Topics  . Alcohol use: Yes    Comment: 1-2 beers on occsion.   . Drug use: No     Allergies   Meloxicam and Mobic [meloxicam]   Review of Systems Review of Systems  Gastrointestinal:       Anal discomfort/lesions   All other systems reviewed and are negative.    Physical Exam Updated Vital Signs BP 109/74 (BP Location: Left Arm)   Pulse 75   Temp 98.5 F (36.9 C) (Oral)   Resp 18   SpO2 95%   Physical Exam Vitals signs and nursing note reviewed.   Constitutional:      Appearance: She is well-developed.     Comments: Non toxic in NAD  HENT:     Head: Normocephalic and atraumatic.     Nose: Nose normal.  Eyes:     Conjunctiva/sclera: Conjunctivae normal.  Neck:     Musculoskeletal: Normal range of motion.  Cardiovascular:     Rate and Rhythm: Normal rate and regular rhythm.  Pulmonary:     Effort: Pulmonary effort is normal.     Breath sounds: Normal breath sounds.  Abdominal:     General: Bowel sounds are normal.     Palpations: Abdomen is soft.  Tenderness: There is no abdominal tenderness.     Comments: No G/R/R. No suprapubic or CVA tenderness. Negative Murphy's and McBurney's. Active BS to lower quadrants.   Genitourinary:    Rectum: Tenderness and external hemorrhoid present.     Comments: Patient in obvious discomfort with sitting up and moving on the bed.  Several, white raised lesions around the anal verge, some are open and ulcerated.  1 external hemorrhoid nonthrombosed at 6:00 also has several lesions on it.  Patient did not tolerate internal exam due to pain.  1 fissure at around 3:00, hemostatic.  Lesions do not involve vaginal mucosa or inguinal creases.  No erythema, fluctuance. Musculoskeletal: Normal range of motion.  Skin:    General: Skin is warm and dry.     Capillary Refill: Capillary refill takes less than 2 seconds.  Neurological:     Mental Status: She is alert.  Psychiatric:        Behavior: Behavior normal.      ED Treatments / Results  Labs (all labs ordered are listed, but only abnormal results are displayed) Labs Reviewed - No data to display  EKG None  Radiology No results found.  Procedures Procedures (including critical care time)  Medications Ordered in ED Medications - No data to display   Initial Impression / Assessment and Plan / ED Course  I have reviewed the triage vital signs and the nursing notes.  Pertinent labs & imaging results that were available during my  care of the patient were reviewed by me and considered in my medical decision making (see chart for details).  Patient has one nonthrombosed external hemorrhoid and one fissure that is hemostatic and only minimally tender.  She has several, tender raised white lesions in summer open/ulcerated.  Adamantly denies any recent sexual encounters and has not been sexually active since 2016.  No history of genital herpes.  Lesions are not in the vaginal area.  Exam is not consistent with perianal abscess, cellulitis.  Etiology is unclear.  Lesions are not vesicular or erythematous and given low risk sexual practices herpes is lower on differential.  She has been using copious amounts of ointment throughout the day and this could be leading to some skin rubbing/irritation.  Recommended sitz baths, tylenol and close GI f/u.  Instructed her to use dilt/lido ointment sparingly anally prior to BMs and not to use too much to avoid maceration of skin.  Will also prescribe nitro/lidocaine ointment since she is not having a lot of relief with diltiazem.  Discussed risk of HA with nitro.  At this time pt deemed appropriate for dc. I don't think emergent lab/imaging is indicated.  She is aware of signs/symptoms that would warrant return to ER. Return precautions given. Pt comfortable with POC.    Final Clinical Impressions(s) / ED Diagnoses   Final diagnoses:  Anal lesion    ED Discharge Orders         Ordered    Nitroglycerin 0.4 % OINT  As needed     01/31/19 2121    Lidocaine-Hydrocortisone Ace 3-0.5 % KIT  As needed     01/31/19 2124           Arlean Hopping 01/31/19 2208    Isla Pence, MD 01/31/19 2342

## 2019-01-31 NOTE — Discharge Instructions (Signed)
You were seen in the ER for lesions around your anus.  On exam there was several, tender, white lesions around the anus.  You have a hemorrhoid as well and one fissure.  The cause of the lesions is unclear.  It could be related to skin rubbing and moistness.  Apply it nitroglycerin ointment in the anus/around the anus right before a bowel movement.  You can also use lidocaine ointment to help numb the area before a bowel movement.  Use a stool softener every day.  Stay hydrated.  Call gastroenterology as soon as possible for reevaluation.  Return to the ER for area of swelling, pus, bleeding, fevers, abdominal pain.

## 2019-01-31 NOTE — ED Triage Notes (Signed)
Pt states that she has a anal fissure, seen GI doctor and states that pain continues and cant wait till the morning.

## 2019-02-01 ENCOUNTER — Telehealth: Payer: Self-pay | Admitting: Physician Assistant

## 2019-02-01 DIAGNOSIS — L603 Nail dystrophy: Secondary | ICD-10-CM | POA: Diagnosis not present

## 2019-02-01 NOTE — Telephone Encounter (Signed)
Left message for pt to call back.  Spoke with pt and the diltiazem she was able to get. She was seen in the ER over the weekend and prescribed nitroglycerin that was 800.00 and lidocaine. She got the lidocaine but the doctor told her to insert it into her rectum, pharmacist said not to. She did place it in the rectum but reports it really burns. She states she is taking the metamucil but has not had a BM, states she cannot get her bottom to open up to have the BM due to the pain. Please advise.

## 2019-02-01 NOTE — Telephone Encounter (Signed)
See note below and advise if any alternative.

## 2019-02-01 NOTE — Telephone Encounter (Signed)
I believe we sent Rx to [facility] so it could be compounded with Lidocaine 5% .Marland Kitchen Please call [facility] and see if NTG ointment  Compounded with Lidocaine 5 % would be cheaper and if so then send Rx for that - 4 times daily  x6-8 weeks apply I/2 to one inch into anus .  She needs one of these  Prescriptions to heal anal fissure

## 2019-02-02 ENCOUNTER — Telehealth: Payer: Self-pay | Admitting: Family Medicine

## 2019-02-02 ENCOUNTER — Other Ambulatory Visit: Payer: Self-pay

## 2019-02-02 ENCOUNTER — Encounter: Payer: Self-pay | Admitting: Family Medicine

## 2019-02-02 ENCOUNTER — Ambulatory Visit (INDEPENDENT_AMBULATORY_CARE_PROVIDER_SITE_OTHER): Payer: BC Managed Care – PPO | Admitting: Family Medicine

## 2019-02-02 VITALS — BP 128/70 | HR 100 | Ht 65.0 in | Wt 195.0 lb

## 2019-02-02 DIAGNOSIS — E1169 Type 2 diabetes mellitus with other specified complication: Secondary | ICD-10-CM

## 2019-02-02 DIAGNOSIS — Z720 Tobacco use: Secondary | ICD-10-CM | POA: Diagnosis not present

## 2019-02-02 DIAGNOSIS — K6289 Other specified diseases of anus and rectum: Secondary | ICD-10-CM | POA: Diagnosis not present

## 2019-02-02 MED ORDER — ONETOUCH ULTRASOFT LANCETS MISC: 12 refills | Status: DC

## 2019-02-02 MED ORDER — ONETOUCH ULTRA 2 W/DEVICE KIT: PACK | 0 refills | Status: DC

## 2019-02-02 MED ORDER — CONTOUR NEXT TEST VI STRP: ORAL_STRIP | 12 refills | Status: AC

## 2019-02-02 MED ORDER — ONETOUCH ULTRA VI STRP: ORAL_STRIP | 12 refills | Status: DC

## 2019-02-02 MED ORDER — MICROLET LANCETS MISC: 12 refills | Status: DC

## 2019-02-02 MED ORDER — CONTOUR NEXT MONITOR W/DEVICE KIT: 1.0000 | PACK | Freq: Every day | 0 refills | Status: AC

## 2019-02-02 NOTE — Patient Instructions (Signed)
Steps to Quit Smoking Smoking tobacco is the leading cause of preventable death. It can affect almost every organ in the body. Smoking puts you and people around you at risk for many serious, long-lasting (chronic) diseases. Quitting smoking can be hard, but it is one of the best things that you can do for your health. It is never too late to quit. How do I get ready to quit? When you decide to quit smoking, make a plan to help you succeed. Before you quit:  Pick a date to quit. Set a date within the next 2 weeks to give you time to prepare.  Write down the reasons why you are quitting. Keep this list in places where you will see it often.  Tell your family, friends, and co-workers that you are quitting. Their support is important.  Talk with your doctor about the choices that may help you quit.  Find out if your health insurance will pay for these treatments.  Know the people, places, things, and activities that make you want to smoke (triggers). Avoid them. What first steps can I take to quit smoking?  Throw away all cigarettes at home, at work, and in your car.  Throw away the things that you use when you smoke, such as ashtrays and lighters.  Clean your car. Make sure to empty the ashtray.  Clean your home, including curtains and carpets. What can I do to help me quit smoking? Talk with your doctor about taking medicines and seeing a counselor at the same time. You are more likely to succeed when you do both.  If you are pregnant or breastfeeding, talk with your doctor about counseling or other ways to quit smoking. Do not take medicine to help you quit smoking unless your doctor tells you to do so. To quit smoking: Quit right away  Quit smoking totally, instead of slowly cutting back on how much you smoke over a period of time.  Go to counseling. You are more likely to quit if you go to counseling sessions regularly. Take medicine You may take medicines to help you quit. Some  medicines need a prescription, and some you can buy over-the-counter. Some medicines may contain a drug called nicotine to replace the nicotine in cigarettes. Medicines may:  Help you to stop having the desire to smoke (cravings).  Help to stop the problems that come when you stop smoking (withdrawal symptoms). Your doctor may ask you to use:  Nicotine patches, gum, or lozenges.  Nicotine inhalers or sprays.  Non-nicotine medicine that is taken by mouth. Find resources Find resources and other ways to help you quit smoking and remain smoke-free after you quit. These resources are most helpful when you use them often. They include:  Online chats with a counselor.  Phone quitlines.  Printed self-help materials.  Support groups or group counseling.  Text messaging programs.  Mobile phone apps. Use apps on your mobile phone or tablet that can help you stick to your quit plan. There are many free apps for mobile phones and tablets as well as websites. Examples include Quit Guide from the CDC and smokefree.gov  What things can I do to make it easier to quit?   Talk to your family and friends. Ask them to support and encourage you.  Call a phone quitline (1-800-QUIT-NOW), reach out to support groups, or work with a counselor.  Ask people who smoke to not smoke around you.  Avoid places that make you want to smoke,   such as: ? Bars. ? Parties. ? Smoke-break areas at work.  Spend time with people who do not smoke.  Lower the stress in your life. Stress can make you want to smoke. Try these things to help your stress: ? Getting regular exercise. ? Doing deep-breathing exercises. ? Doing yoga. ? Meditating. ? Doing a body scan. To do this, close your eyes, focus on one area of your body at a time from head to toe. Notice which parts of your body are tense. Try to relax the muscles in those areas. How will I feel when I quit smoking? Day 1 to 3 weeks Within the first 24 hours,  you may start to have some problems that come from quitting tobacco. These problems are very bad 2-3 days after you quit, but they do not often last for more than 2-3 weeks. You may get these symptoms:  Mood swings.  Feeling restless, nervous, angry, or annoyed.  Trouble concentrating.  Dizziness.  Strong desire for high-sugar foods and nicotine.  Weight gain.  Trouble pooping (constipation).  Feeling like you may vomit (nausea).  Coughing or a sore throat.  Changes in how the medicines that you take for other issues work in your body.  Depression.  Trouble sleeping (insomnia). Week 3 and afterward After the first 2-3 weeks of quitting, you may start to notice more positive results, such as:  Better sense of smell and taste.  Less coughing and sore throat.  Slower heart rate.  Lower blood pressure.  Clearer skin.  Better breathing.  Fewer sick days. Quitting smoking can be hard. Do not give up if you fail the first time. Some people need to try a few times before they succeed. Do your best to stick to your quit plan, and talk with your doctor if you have any questions or concerns. Summary  Smoking tobacco is the leading cause of preventable death. Quitting smoking can be hard, but it is one of the best things that you can do for your health.  When you decide to quit smoking, make a plan to help you succeed.  Quit smoking right away, not slowly over a period of time.  When you start quitting, seek help from your doctor, family, or friends. This information is not intended to replace advice given to you by your health care provider. Make sure you discuss any questions you have with your health care provider. Document Released: 01/12/2009 Document Revised: 06/05/2018 Document Reviewed: 06/06/2018 Elsevier Patient Education  2020 Elsevier Inc.  

## 2019-02-02 NOTE — Telephone Encounter (Signed)
Rx's changed and sent to pharmacy as requested.

## 2019-02-02 NOTE — Telephone Encounter (Signed)
Pt called stating the meter that was sent in for her was not able to be covered by her insurance. Pt is requesting to have the countor meter and test strips could be covered by her insurance. Please advise.    [facility]  [place] [address]  Phone: [phone] Fax: [phone]  Not a 24 hour pharmacy; exact hours not known.

## 2019-02-02 NOTE — Telephone Encounter (Signed)
Spoke with the patient. Advised she needs to give the pharmacy her insurance information. They may not have had that since it was not her regular pharmacy. Advised that the lidocaine does burn at first. Use it right before her bowel movement for comfort. Also discussed the Miralax.

## 2019-02-02 NOTE — Progress Notes (Signed)
Established Patient Office Visit  Subjective:  Patient ID: Alisha Espinoza, female    DOB: May 02, 1965  Age: 53 y.o. MRN: [id number]  CC:  Chief Complaint  Patient presents with  . Follow-up    HPI [name] presents for follow-up of her anal pain diabetes and tobacco usage.  Anal pain is somewhat improved.  She did see gastroenterology but they have prescribed her medicines that are too expensive.  She was encouraged to contact them and ask for less expensive medicines.  She is using a stimulant laxative on occasion and Metamucil.  Advised her to avoid use of stimulant laxatives as much as possible.  Neurosurgery is currently weaning her off of the OxyContin use.  I sent this may help with her difficulty with stooling.  She is not exercising much at this time.  I encouraged her to start a walking program.  This will also help with stooling.  She is interested in quitting smoking at this time.  I counseled her that this was one of the most important thing she could do for her health.  Past Medical History:  Diagnosis Date  . Bronchitis   . Diabetes mellitus ([place]) 01/19/2019  . Dyspnea   . Heart murmur    resolved "closed by age 8."   . Hyperlipidemia   . Seasonal allergies     Past Surgical History:  Procedure Laterality Date  . ABDOMINAL HYSTERECTOMY  2000  . ANTERIOR CERVICAL DECOMP/DISCECTOMY FUSION  03/19/2012   Procedure: ANTERIOR CERVICAL DECOMPRESSION/DISCECTOMY FUSION 1 LEVEL;  Surgeon: Melina Schools, MD;  Location: [facility];  Service: Orthopedics;  Laterality: Left;  Total Disc Replacement C4-5  . ANTERIOR CERVICAL DECOMP/DISCECTOMY FUSION N/A 05/07/2012   Procedure: ANTERIOR CERVICAL DECOMPRESSION/DISCECTOMY FUSION 1 LEVEL/HARDWARE REMOVAL;  Surgeon: Melina Schools, MD;  Location: [facility];  Service: Orthopedics;  Laterality: N/A;  REMOVAL OF CERVICAL DISC REPLACEMENT AND ACDF C4-5  . BACK SURGERY    . CARPAL TUNNEL RELEASE Right 09/28/2018   Procedure: CARPAL TUNNEL  RELEASE;  Surgeon: Eustace Moore, MD;  Location: [facility];  Service: Neurosurgery;  Laterality: Right;  CARPAL TUNNEL RELEASE  . CERVICAL FUSION  05/07/2012   Dr Rolena Infante  . COLONOSCOPY    . CYSTECTOMY     L wrist  . POLYPECTOMY    . ROTATOR CUFF REPAIR     Right  . TYMPANOSTOMY TUBE PLACEMENT      Family History  Problem Relation Age of Onset  . Diabetes Mother   . Hypertension Father   . Pancreatic cancer Sister   . Colon cancer Neg Hx   . Rectal cancer Neg Hx   . Stomach cancer Neg Hx   . Colon polyps Neg Hx   . Esophageal cancer Neg Hx     Social History   Socioeconomic History  . Marital status: Divorced    Spouse name: Not on file  . Number of children: Not on file  . Years of education: Not on file  . Highest education level: Not on file  Occupational History  . Not on file  Social Needs  . Financial resource strain: Not on file  . Food insecurity    Worry: Not on file    Inability: Not on file  . Transportation needs    Medical: Not on file    Non-medical: Not on file  Tobacco Use  . Smoking status: Current Every Day Smoker    Packs/day: 0.50    Years: 30.00  Pack years: 15.00    Types: Cigarettes  . Smokeless tobacco: Never Used  Substance and Sexual Activity  . Alcohol use: Yes    Comment: 1-2 beers on occsion.   . Drug use: No  . Sexual activity: Yes    Birth control/protection: Surgical    Comment: Hysterectomy  Lifestyle  . Physical activity    Days per week: Not on file    Minutes per session: Not on file  . Stress: Not on file  Relationships  . Social Herbalist on phone: Not on file    Gets together: Not on file    Attends religious service: Not on file    Active member of club or organization: Not on file    Attends meetings of clubs or organizations: Not on file    Relationship status: Not on file  . Intimate partner violence    Fear of current or ex partner: Not on file    Emotionally abused: Not on file    Physically  abused: Not on file    Forced sexual activity: Not on file  Other Topics Concern  . Not on file  Social History Narrative  . Not on file    Outpatient Medications Prior to Visit  Medication Sig Dispense Refill  . albuterol (PROVENTIL HFA;VENTOLIN HFA) 108 (90 Base) MCG/ACT inhaler Inhale 1-2 puffs into the lungs every 4 (four) hours as needed for wheezing or shortness of breath (or coughing). 1 Inhaler 1  . AMBULATORY NON FORMULARY MEDICATION Medication Name: Diltiazem Ointment 2%/ Lidocaine 5% Insert 1 inch into anus four times daily 30 g 3  . Lidocaine-Hydrocortisone Ace 3-0.5 % KIT Place 0.5 inches rectally as needed (use pea sized amount in anus/around anus before a bowel movement). 1 kit 0  . metFORMIN (GLUCOPHAGE) 500 MG tablet Take 1 tablet (500 mg total) by mouth 2 (two) times daily with a meal. 180 tablet 3  . naproxen sodium (ANAPROX) 550 MG tablet Take 1 tablet by mouth 2 (two) times daily.    . Nitroglycerin 0.4 % OINT Place 0.5 inches rectally as needed. Apply pea sized amount in/around anus prior to bowel movements 30 g 0  . oxyCODONE (OXY IR/ROXICODONE) 5 MG immediate release tablet Take 1 tablet by mouth every 6 (six) hours as needed.    . simvastatin (ZOCOR) 20 MG tablet Take 20 mg by mouth daily.  1  . tiZANidine (ZANAFLEX) 4 MG tablet Take 1 tablet by mouth every 8 (eight) hours as needed.    . TRELEGY ELLIPTA 100-62.5-25 MCG/INH AEPB Inhale 1 puff into the lungs daily.     No facility-administered medications prior to visit.     Allergies  Allergen Reactions  . Meloxicam Hives  . Mobic [Meloxicam]     Other reaction(s): Hives    ROS Review of Systems  Constitutional: Negative.   HENT: Negative.   Eyes: Negative for photophobia and visual disturbance.  Respiratory: Negative.   Cardiovascular: Negative.   Gastrointestinal: Positive for constipation and rectal pain. Negative for abdominal pain, anal bleeding and blood in stool.  Endocrine: Negative for  polyphagia and polyuria.  Musculoskeletal: Positive for back pain.  Skin: Negative for pallor and rash.  Allergic/Immunologic: Negative for immunocompromised state.  Neurological: Positive for numbness (medial left ankle. ).  Hematological: Does not bruise/bleed easily.  Psychiatric/Behavioral: Negative.       Objective:    Physical Exam  Constitutional: She is oriented to person, place, and time. She appears well-developed  and well-nourished. No distress.  HENT:  Head: Normocephalic and atraumatic.  Right Ear: External ear normal.  Left Ear: External ear normal.  Eyes: Conjunctivae are normal. Right eye exhibits no discharge. Left eye exhibits no discharge. No scleral icterus.  Neck: No JVD present. No tracheal deviation present.  Cardiovascular:  Pulses:      Dorsalis pedis pulses are 2+ on the right side and 2+ on the left side.       Posterior tibial pulses are 2+ on the right side and 2+ on the left side.  Pulmonary/Chest: Effort normal.  Musculoskeletal:        General: No edema.  Neurological: She is alert and oriented to person, place, and time.  Skin: Skin is warm and dry. She is not diaphoretic.  Psychiatric: She has a normal mood and affect. Her behavior is normal.   Diabetic Foot Exam - Simple   Simple Foot Form Diabetic Foot exam was performed with the following findings: Yes 02/02/2019  8:59 AM  Visual Inspection See comments: Yes Sensation Testing Intact to touch and monofilament testing bilaterally: Yes Pulse Check Posterior Tibialis and Dorsalis pulse intact bilaterally: Yes Comments  Feet are cavus bilaterally.  There are no lesions or rashes.  Some nails are thick and oncotic.    BP 128/70   Pulse 100   Ht _0  (1.651 m)   Wt 195 lb (88.5 kg)   SpO2 98%   BMI 32.45 kg/m  Wt Readings from Last 3 Encounters:  02/02/19 195 lb (88.5 kg)  01/27/19 199 lb 6 oz (90.4 kg)  01/25/19 198 lb 12.8 oz (90.2 kg)   BP Readings from Last 3 Encounters:   02/02/19 128/70  01/31/19 109/74  01/27/19 100/70   Guideline developer:  UpToDate (see UpToDate for funding source) Date Released: June 2014  Health Maintenance Due  Topic Date Due  . FOOT EXAM  05/21/1975  . OPHTHALMOLOGY EXAM  05/21/1975  . HIV Screening  05/20/1980  . TETANUS/TDAP  05/20/1984  . PAP SMEAR-Modifier  05/20/1986    There are no preventive care reminders to display for this patient.  No results found for: TSH Lab Results  Component Value Date   WBC 6.8 01/20/2019   HGB 15.9 (H) 01/20/2019   HCT 48.9 (H) 01/20/2019   MCV 80.3 01/20/2019   PLT 226.0 01/20/2019   Lab Results  Component Value Date   NA 135 01/21/2019   K 4.1 01/21/2019   CO2 25 01/21/2019   GLUCOSE 86 01/21/2019   BUN 16 01/21/2019   CREATININE 0.87 01/21/2019   BILITOT 0.5 01/20/2019   ALKPHOS 78 01/20/2019   AST 17 01/20/2019   ALT 23 01/20/2019   PROT 6.9 01/20/2019   ALBUMIN 4.7 01/20/2019   CALCIUM 10.0 01/21/2019   ANIONGAP 12 01/19/2019   GFR 82.20 01/21/2019   Lab Results  Component Value Date   CHOL 170 01/20/2019   Lab Results  Component Value Date   HDL 46.80 01/20/2019   Lab Results  Component Value Date   LDLCALC 100 (H) 01/20/2019   Lab Results  Component Value Date   TRIG 116.0 01/20/2019   Lab Results  Component Value Date   CHOLHDL 4 01/20/2019   Lab Results  Component Value Date   HGBA1C 7.1 (H) 01/20/2019    Assessment: Progress toward smoking cessation:    Barriers to progress toward smoking cessation:    Comments: patient is interested.  Plan: Instruction/counseling given:  I counseled patient on the  dangers of tobacco use, advised patient to stop smoking, and reviewed strategies to maximize success. Educational resources provided:    Self management tools provided:    Medications to assist with smoking cessation:  None Patient agreed to the following self-care plans for smoking cessation:    Other plans: patient was given  information on steps to quit.      Assessment & Plan:   Problem List Items Addressed This Visit      Endocrine   Diabetes mellitus ([place]) - Primary    Other Visit Diagnoses    Anal pain       Tobacco use          Meds ordered this encounter  Medications  . Blood Glucose Monitoring Suppl (ONE TOUCH ULTRA 2) w/Device KIT    Sig: Use to test blood sugars 1-2 times daily.    Dispense:  1 kit    Refill:  0  . glucose blood (ONETOUCH ULTRA) test strip    Sig: Use to test blood sugars 1-2 times daily.    Dispense:  100 each    Refill:  12  . Lancets (ONETOUCH ULTRASOFT) lancets    Sig: Use to test blood sugars 1-2 times daily.    Dispense:  100 each    Refill:  12    Follow-up: Return in about 3 months (around 05/05/2019).   Continue with weaning of oxycodone and follow-up with gastroenterology.

## 2019-02-02 NOTE — Telephone Encounter (Signed)
miralax 17 gm po daily , may need two doses first day , then stay on as long as needed.

## 2019-02-03 ENCOUNTER — Ambulatory Visit: Payer: BC Managed Care – PPO | Admitting: Gastroenterology

## 2019-02-03 NOTE — Telephone Encounter (Signed)
Patient is calling back stating that she the medication that Amy had told her to use the miralax and the lidocaine is not working. She is still unable to use the restroom- is constipated and the anal fissure is still there and hurting. She has an appt to come back in to see a PA on 11/10. This was the soonest appt that we had available she was asking to be seen sooner or if there is something else she can do to help.

## 2019-02-03 NOTE — Telephone Encounter (Signed)
Alisha Espinoza this patient is saying she is not passing stool at all. Can she purge?

## 2019-02-04 ENCOUNTER — Telehealth: Payer: Self-pay | Admitting: Physician Assistant

## 2019-02-04 NOTE — Telephone Encounter (Signed)
Patient answers but says she is on her way to the pharmacy to pick up her medications. She will call back when she can. Advised very briefly that the Miralax purge is different from her present daily dosing of Miralax. She states understanding and says she will call me back.

## 2019-02-04 NOTE — Telephone Encounter (Signed)
Yes definitely - can give her a Miralax  purge , then keep appt next week - after the purge she needs to stay on Miralax once daily, stool softener if needed

## 2019-02-04 NOTE — Telephone Encounter (Signed)
No answer. Left a message to call.

## 2019-02-05 ENCOUNTER — Encounter (HOSPITAL_COMMUNITY): Payer: Self-pay | Admitting: Emergency Medicine

## 2019-02-05 ENCOUNTER — Emergency Department (HOSPITAL_COMMUNITY)
Admission: EM | Admit: 2019-02-05 | Discharge: 2019-02-05 | Disposition: A | Discharge status: Home / Self Care | Payer: BC Managed Care – PPO | Source: Home / Self Care | Attending: Emergency Medicine | Admitting: Emergency Medicine

## 2019-02-05 ENCOUNTER — Other Ambulatory Visit: Payer: Self-pay

## 2019-02-05 ENCOUNTER — Emergency Department (HOSPITAL_COMMUNITY): Payer: BC Managed Care – PPO

## 2019-02-05 ENCOUNTER — Emergency Department (HOSPITAL_COMMUNITY)
Admission: EM | Admit: 2019-02-05 | Discharge: 2019-02-05 | Disposition: A | Discharge status: Home / Self Care | Payer: BC Managed Care – PPO | Attending: Emergency Medicine | Admitting: Emergency Medicine

## 2019-02-05 DIAGNOSIS — M65331 Trigger finger, right middle finger: Secondary | ICD-10-CM | POA: Diagnosis not present

## 2019-02-05 DIAGNOSIS — M65341 Trigger finger, right ring finger: Secondary | ICD-10-CM | POA: Diagnosis not present

## 2019-02-05 DIAGNOSIS — I10 Essential (primary) hypertension: Secondary | ICD-10-CM | POA: Insufficient documentation

## 2019-02-05 DIAGNOSIS — R1030 Lower abdominal pain, unspecified: Secondary | ICD-10-CM | POA: Diagnosis not present

## 2019-02-05 DIAGNOSIS — Z5321 Procedure and treatment not carried out due to patient leaving prior to being seen by health care provider: Secondary | ICD-10-CM | POA: Diagnosis not present

## 2019-02-05 DIAGNOSIS — Z79899 Other long term (current) drug therapy: Secondary | ICD-10-CM | POA: Insufficient documentation

## 2019-02-05 DIAGNOSIS — B009 Herpesviral infection, unspecified: Secondary | ICD-10-CM

## 2019-02-05 DIAGNOSIS — M65332 Trigger finger, left middle finger: Secondary | ICD-10-CM | POA: Diagnosis not present

## 2019-02-05 DIAGNOSIS — F17210 Nicotine dependence, cigarettes, uncomplicated: Secondary | ICD-10-CM | POA: Insufficient documentation

## 2019-02-05 DIAGNOSIS — M65311 Trigger thumb, right thumb: Secondary | ICD-10-CM | POA: Diagnosis not present

## 2019-02-05 DIAGNOSIS — K5900 Constipation, unspecified: Secondary | ICD-10-CM | POA: Diagnosis not present

## 2019-02-05 DIAGNOSIS — B029 Zoster without complications: Secondary | ICD-10-CM | POA: Insufficient documentation

## 2019-02-05 DIAGNOSIS — E119 Type 2 diabetes mellitus without complications: Secondary | ICD-10-CM | POA: Insufficient documentation

## 2019-02-05 DIAGNOSIS — R141 Gas pain: Secondary | ICD-10-CM | POA: Insufficient documentation

## 2019-02-05 LAB — BASIC METABOLIC PANEL
Anion gap: 9 (ref 5–15)
BUN: 12 mg/dL (ref 6–20)
CO2: 28 mmol/L (ref 22–32)
Calcium: 9.5 mg/dL (ref 8.9–10.3)
Chloride: 99 mmol/L (ref 98–111)
Creatinine, Ser: 0.86 mg/dL (ref 0.44–1.00)
GFR calc Af Amer: >60 mL/min (ref >60)
GFR calc non Af Amer: >60 mL/min (ref >60)
Glucose, Bld: 106 mg/dL — ABNORMAL HIGH (ref 70–99)
Potassium: 3.9 mmol/L (ref 3.5–5.1)
Sodium: 136 mmol/L (ref 135–145)

## 2019-02-05 LAB — CBC WITH DIFFERENTIAL/PLATELET
Abs Immature Granulocytes: 0.01 10*3/uL (ref 0.00–0.07)
Basophils Absolute: 0.1 10*3/uL (ref 0.0–0.1)
Basophils Relative: 1 %
Eosinophils Absolute: 0.3 10*3/uL (ref 0.0–0.5)
Eosinophils Relative: 4 %
HCT: 47.6 % — ABNORMAL HIGH (ref 36.0–46.0)
Hemoglobin: 15.4 g/dL — ABNORMAL HIGH (ref 12.0–15.0)
Immature Granulocytes: 0 %
Lymphocytes Relative: 56 %
Lymphs Abs: 4 10*3/uL (ref 0.7–4.0)
MCH: 26.1 pg (ref 26.0–34.0)
MCHC: 32.4 g/dL (ref 30.0–36.0)
MCV: 80.7 fL (ref 80.0–100.0)
Monocytes Absolute: 0.5 10*3/uL (ref 0.1–1.0)
Monocytes Relative: 8 %
Neutro Abs: 2.2 10*3/uL (ref 1.7–7.7)
Neutrophils Relative %: 31 %
Platelets: 248 10*3/uL (ref 150–400)
RBC: 5.9 MIL/uL — ABNORMAL HIGH (ref 3.87–5.11)
RDW: 17.3 % — ABNORMAL HIGH (ref 11.5–15.5)
WBC: 7.2 10*3/uL (ref 4.0–10.5)
nRBC: 0 % (ref 0.0–0.2)

## 2019-02-05 MED ORDER — VALACYCLOVIR HCL 500 MG PO TABS
1000.0000 mg | ORAL_TABLET | ORAL | Status: AC
  Administered 2019-02-05: 1000 mg via ORAL
  Filled 2019-02-05: qty 2

## 2019-02-05 MED ORDER — HYDROCORTISONE ACETATE 25 MG RE SUPP
25.0000 mg | Freq: Once | RECTAL | Status: DC
  Filled 2019-02-05: qty 1

## 2019-02-05 MED ORDER — VALACYCLOVIR HCL 1 G PO TABS: 1000.0000 mg | ORAL_TABLET | Freq: Two times a day (BID) | ORAL | 0 refills | Status: DC

## 2019-02-05 MED ORDER — LIDOCAINE HCL URETHRAL/MUCOSAL 2 % EX GEL
1.0000 "application " | Freq: Once | CUTANEOUS | Status: AC
  Administered 2019-02-05: 1 via TOPICAL
  Filled 2019-02-05: qty 5

## 2019-02-05 MED ORDER — ACETAMINOPHEN 500 MG PO TABS
1000.0000 mg | ORAL_TABLET | Freq: Once | ORAL | Status: AC
  Administered 2019-02-05: 05:00:00 1000 mg via ORAL
  Filled 2019-02-05: qty 2

## 2019-02-05 NOTE — ED Notes (Signed)
Patient transported to X-ray 

## 2019-02-05 NOTE — ED Triage Notes (Addendum)
Patient complaining of constipation with lower abdominal pain. Patient states that it started this week. Patient has a rectal fissure that is painful. No other symptoms. Patient left Alisha Espinoza and has had blood work done.

## 2019-02-05 NOTE — Telephone Encounter (Signed)
Spoke with the patient. Details of Miralax purge given to the patient.

## 2019-02-05 NOTE — ED Provider Notes (Signed)
[facility] DEPT Provider Note   CSN: [id number] Arrival date & time: 02/05/19  0345     History   Chief Complaint Chief Complaint  Alisha Espinoza presents with   Constipation    HPI Alisha Espinoza is a 53 y.o. female.     The history is provided by the Alisha Espinoza.  Constipation Severity:  Moderate Timing:  Constant Progression:  Unchanged Chronicity:  Recurrent Stool description:  None produced Relieved by:  Nothing Worsened by:  Narcotic pain medications Ineffective treatments: miralax. Associated symptoms: no anorexia, no back pain, no diarrhea, no dysuria, no fever, no flatus, no hematochezia, no nausea, no urinary retention and no vomiting   Risk factors: no change in medication   Alisha Espinoza presents with ongoing constipation and rectal burning who which home narcotics lidocaine and nitroglycerin ointment are not helping.  Alisha Espinoza has been diagnosed with a fissure and hemorrhoid but Alisha Espinoza is getting no relief.  Alisha Espinoza has been seen for same on multiple occasions in the ED and by GI.    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   BACTERIAL PNEUMONIA, RIGHT LOWER LOBE 04/27/2008   LUMP OR MASS IN BREAST 04/27/2008   COUGH 04/27/2008    Past Surgical History:  Procedure Laterality Date   ABDOMINAL HYSTERECTOMY  2000   ANTERIOR CERVICAL DECOMP/DISCECTOMY FUSION   03/19/2012   Procedure: ANTERIOR CERVICAL DECOMPRESSION/DISCECTOMY FUSION 1 LEVEL;  Surgeon: Melina Schools, MD;  Location: [facility];  Service: Orthopedics;  Laterality: Left;  Total Disc Replacement C4-5   ANTERIOR CERVICAL DECOMP/DISCECTOMY FUSION N/A 05/07/2012   Procedure: ANTERIOR CERVICAL DECOMPRESSION/DISCECTOMY FUSION 1 LEVEL/HARDWARE REMOVAL;  Surgeon: Melina Schools, MD;  Location: [facility];  Service: Orthopedics;  Laterality: N/A;  REMOVAL OF CERVICAL DISC REPLACEMENT AND ACDF C4-5   BACK SURGERY     CARPAL TUNNEL RELEASE Right 09/28/2018   Procedure: CARPAL TUNNEL RELEASE;  Surgeon: Eustace Moore, MD;  Location: [facility];  Service: Neurosurgery;  Laterality: Right;  CARPAL TUNNEL RELEASE   CERVICAL FUSION  05/07/2012   Dr Rolena Infante   COLONOSCOPY     CYSTECTOMY     L wrist   POLYPECTOMY     ROTATOR CUFF REPAIR     Right   TYMPANOSTOMY TUBE PLACEMENT       OB History   No obstetric history on file.      Home Medications    Prior to Admission medications   Medication Sig Start Date End Date Taking? Authorizing Provider  albuterol (PROVENTIL HFA;VENTOLIN HFA) 108 (90 Base) MCG/ACT inhaler Inhale 1-2 puffs into the lungs every 4 (four) hours as needed for wheezing or shortness of breath (or coughing). 09/25/01   Delora Fuel, MD  AMBULATORY NON FORMULARY MEDICATION Medication Name: Diltiazem Ointment 2%/ Lidocaine 5% Insert 1 inch into anus four times daily 01/27/19   Esterwood, Amy S, PA-C  Blood Glucose Monitoring Suppl (CONTOUR  NEXT MONITOR) w/Device KIT 1 each by Does not apply route daily. Use to test blood sugars 1-2 times daily. 02/02/19   Libby Maw, MD  glucose blood (CONTOUR NEXT TEST) test strip Use to test 1-2 times daily. 02/02/19   Libby Maw, MD  Lidocaine-Hydrocortisone Ace 3-0.5 % KIT Place 0.5 inches rectally as needed (use pea sized amount in anus/around anus before a bowel movement). 01/31/19   Kinnie Feil, PA-C  metFORMIN (GLUCOPHAGE)  500 MG tablet Take 1 tablet (500 mg total) by mouth 2 (two) times daily with a meal. 01/21/19   Libby Maw, MD  Microlet Lancets MISC Use to test blood sugars 1-2 times daily. 02/02/19   Libby Maw, MD  naproxen sodium (ANAPROX) 550 MG tablet Take 1 tablet by mouth 2 (two) times daily. 01/18/19   [provider]  Nitroglycerin 0.4 % OINT Place 0.5 inches rectally as needed. Apply pea sized amount in/around anus prior to bowel movements 01/31/19   Kinnie Feil, PA-C  oxyCODONE (OXY IR/ROXICODONE) 5 MG immediate release tablet Take 1 tablet by mouth every 6 (six) hours as needed. 12/03/18   [provider]  simvastatin (ZOCOR) 20 MG tablet Take 20 mg by mouth daily. 01/26/18   [provider]  tiZANidine (ZANAFLEX) 4 MG tablet Take 1 tablet by mouth every 8 (eight) hours as needed. 01/04/19   [provider]  TRELEGY ELLIPTA 100-62.5-25 MCG/INH AEPB Inhale 1 puff into the lungs daily. 01/04/19   [provider]  valACYclovir (VALTREX) 1000 MG tablet Take 1 tablet (1,000 mg total) by mouth 2 (two) times daily for 10 days. 02/05/19 02/15/19  Ellena Kamen, MD    Family History Family History  Problem Relation Age of Onset   Diabetes Mother    Hypertension Father    Pancreatic cancer Sister    Colon cancer Neg Hx    Rectal cancer Neg Hx    Stomach cancer Neg Hx    Colon polyps Neg Hx    Esophageal cancer Neg Hx     Social History Social History   Tobacco Use   Smoking status: Current Every Day Smoker    Packs/day: 0.50    Years: 30.00    Pack years: 15.00    Types: Cigarettes   Smokeless tobacco: Never Used  Substance Use Topics   Alcohol use: Yes    Comment: 1-2 beers on occsion.    Drug use: No     Allergies   Meloxicam and Mobic [meloxicam]   Review of Systems Review of Systems  Constitutional: Negative for fever.  HENT: Negative for sore throat.   Eyes: Negative for visual disturbance.    Respiratory: Negative for cough.   Cardiovascular: Negative for chest pain.  Gastrointestinal: Positive for constipation. Negative for anorexia, diarrhea, flatus, hematochezia, nausea and vomiting.       Rectal pain  Genitourinary: Negative for dysuria, genital sores, vaginal bleeding, vaginal discharge and vaginal pain.  Musculoskeletal: Negative for back pain.  Neurological: Negative for dizziness.  Psychiatric/Behavioral: Negative for agitation.  All other systems reviewed and are negative.    Physical Exam Updated Vital Signs BP 115/78    Pulse 68    Temp 97.9 F (36.6 C) (Oral)    Resp 18    Ht _0  (1.651 m)    Wt 88.5 kg    SpO2 100%    BMI 32.45 kg/m   Physical Exam Vitals signs and nursing note reviewed.  Constitutional:  General: Alisha Espinoza is not in acute distress.    Appearance: Normal appearance.     Comments: Sleeping in room upon entrance  HENT:     Head: Normocephalic and atraumatic.     Nose: Nose normal.  Eyes:     Conjunctiva/sclera: Conjunctivae normal.     Pupils: Pupils are equal, round, and reactive to light.  Neck:     Musculoskeletal: Normal range of motion and neck supple.  Cardiovascular:     Rate and Rhythm: Normal rate and regular rhythm.     Pulses: Normal pulses.     Heart sounds: Normal heart sounds.  Pulmonary:     Effort: Pulmonary effort is normal.     Breath sounds: Normal breath sounds.  Abdominal:     General: Abdomen is flat. Bowel sounds are normal.     Palpations: Abdomen is soft. There is no mass.     Tenderness: There is no abdominal tenderness. There is no guarding or rebound.     Hernia: No hernia is present.  Genitourinary:    Comments: Chaperone present.  Prolapsed hemorrhoid, nonthrombosed.  7# 83m perirectal ulcerations consistent with herpes.  Healing small fissure.  Stating Alisha Espinoza did not feel pain with exam.  Musculoskeletal: Normal range of motion.  Skin:    General: Skin is warm and dry.     Capillary Refill:  Capillary refill takes less than 2 seconds.  Neurological:     General: No focal deficit present.     Mental Status: Alisha Espinoza is alert and oriented to person, place, and time.     Deep Tendon Reflexes: Reflexes normal.  Psychiatric:        Mood and Affect: Mood normal. Affect is angry.        Behavior: Behavior normal.      ED Treatments / Results  Labs (all labs ordered are listed, but only abnormal results are displayed) Results for orders placed or performed during the hospital encounter of 02/05/19  CBC with Differential  Result Value Ref Range   WBC 7.2 4.0 - 10.5 K/uL   RBC 5.90 (H) 3.87 - 5.11 MIL/uL   Hemoglobin 15.4 (H) 12.0 - 15.0 g/dL   HCT 47.6 (H) 36.0 - 46.0 %   MCV 80.7 80.0 - 100.0 fL   MCH 26.1 26.0 - 34.0 pg   MCHC 32.4 30.0 - 36.0 g/dL   RDW 17.3 (H) 11.5 - 15.5 %   Platelets 248 150 - 400 K/uL   nRBC 0.0 0.0 - 0.2 %   Neutrophils Relative % 31 %   Neutro Abs 2.2 1.7 - 7.7 K/uL   Lymphocytes Relative 56 %   Lymphs Abs 4.0 0.7 - 4.0 K/uL   Monocytes Relative 8 %   Monocytes Absolute 0.5 0.1 - 1.0 K/uL   Eosinophils Relative 4 %   Eosinophils Absolute 0.3 0.0 - 0.5 K/uL   Basophils Relative 1 %   Basophils Absolute 0.1 0.0 - 0.1 K/uL   Immature Granulocytes 0 %   Abs Immature Granulocytes 0.01 0.00 - 0.07 K/uL  Basic metabolic panel  Result Value Ref Range   Sodium 136 135 - 145 mmol/L   Potassium 3.9 3.5 - 5.1 mmol/L   Chloride 99 98 - 111 mmol/L   CO2 28 22 - 32 mmol/L   Glucose, Bld 106 (H) 70 - 99 mg/dL   BUN 12 6 - 20 mg/dL   Creatinine, Ser 0.86 0.44 - 1.00 mg/dL   Calcium 9.5 8.9 - 10.3 mg/dL  GFR calc non Af Amer >60 >60 mL/min   GFR calc Af Amer >60 >60 mL/min   Anion gap 9 5 - 15   Ct Abdomen Pelvis W Contrast  Result Date: 01/19/2019 CLINICAL DATA:  Severe rectal pain, rectal burning after beginning new medications for diabetes and hypertension EXAM: CT ABDOMEN AND PELVIS WITH CONTRAST TECHNIQUE: Multidetector CT imaging of the abdomen  and pelvis was performed using the standard protocol following bolus administration of intravenous contrast. CONTRAST:  167m OMNIPAQUE IOHEXOL 300 MG/ML  SOLN COMPARISON:  CT abdomen pelvis 07/27/2012 FINDINGS: Lower chest: Bandlike areas of subsegmental atelectasis and/or scarring present in the right middle lobe and lingula. Lung bases are otherwise clear. Normal heart size. No pericardial effusion. Hepatobiliary: No focal liver abnormality is seen. No gallstones, gallbladder wall thickening, or biliary dilatation. Pancreas: Unremarkable. No pancreatic ductal dilatation or surrounding inflammatory changes. Spleen: Normal in size without focal abnormality. Adrenals/Urinary Tract: Nodular thickening of both adrenal glands. Dominant 1.5 cm nodules are seen in both adrenal glands, unchanged from the comparison noncontrast exam where these both measure density below 0 Hounsfield units compatible with lipid rich adenomas. Kidneys are normal, without renal calculi, focal lesion, or hydronephrosis. Bladder is unremarkable. No extravasation of contrast is seen on excretory phase delayed imaging. Stomach/Bowel: Distal esophagus, stomach and duodenal sweep are unremarkable. No small bowel wall thickening or dilatation. No evidence of obstruction. A normal appendix is visualized. No colonic dilatation or wall thickening. Mild circumferential thickening at the level of the rectum with mucosal hyperemia. No mesorectal fat stranding or infiltration. No evidence of perirectal abscess. Vascular/Lymphatic: Atherosclerotic plaque within the normal caliber aorta. Additional calcification of the branch vessels. No suspicious or enlarged lymph nodes in the included lymphatic chains. Reproductive: Uterus is surgically absent. No concerning adnexal lesions. Other: No abdominopelvic free fluid or free gas. No bowel containing hernias. Musculoskeletal: Postsurgical changes from prior L3-L5 posterior spinal fusion with interbody spacer  placement. Sclerotic changes vertebral bodies adjacent the spacers. Additional degenerative features are noted in both sacroiliac joints and hips. IMPRESSION: Mild circumferential thickening at the level of the rectum with mucosal hyperemia, suggestive of proctitis. No CT evidence of perirectal abscess. 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 Prior L3-L5 posterior spinal decompression and fusion with sclerotic endplate changes about the interbody spacers. Electronically Signed   By: PLovena LeM.D.   On: 01/19/2019 20:39   Dg Abdomen Acute W/chest  Result Date: 02/05/2019 CLINICAL DATA:  Constipation. EXAM: DG ABDOMEN ACUTE W/ 1V CHEST COMPARISON:  None. FINDINGS: Atelectasis is noted at the lung bases. There is no pneumothorax. No large pleural effusion. The heart size is normal. Multiple air-fluid levels are noted in the abdomen, most of which appear to be located within the colon. There is gaseous distention of the colon. There is no pneumatosis. No evidence for free air. IMPRESSION: 1. No acute cardiopulmonary process. There is a small amount of atelectasis at the lung bases. 2. Air-fluid levels that appear to be primarily centered within the colon. Findings are consistent with liquid stool. There is some mild to moderate gaseous distention of loops of colon. No definite evidence for a high-grade small bowel obstruction. Short interval repeat x-ray may be useful for further evaluation. Electronically Signed   By: CConstance HolsterM.D.   On: 02/05/2019 05:14    Radiology Dg Abdomen Acute W/chest  Result Date: 02/05/2019 CLINICAL DATA:  Constipation. EXAM: DG ABDOMEN ACUTE W/ 1V CHEST COMPARISON:  None. FINDINGS: Atelectasis is noted at  the lung bases. There is no pneumothorax. No large pleural effusion. The heart size is normal. Multiple air-fluid levels are noted in the abdomen, most of which appear to be located within the colon. There is gaseous distention of the colon.  There is no pneumatosis. No evidence for free air. IMPRESSION: 1. No acute cardiopulmonary process. There is a small amount of atelectasis at the lung bases. 2. Air-fluid levels that appear to be primarily centered within the colon. Findings are consistent with liquid stool. There is some mild to moderate gaseous distention of loops of colon. No definite evidence for a high-grade small bowel obstruction. Short interval repeat x-ray may be useful for further evaluation. Electronically Signed   By: Constance Holster M.D.   On: 02/05/2019 05:14    Procedures Procedures (including critical care time)  Medications Ordered in ED Medications  hydrocortisone (ANUSOL-HC) suppository 25 mg (25 mg Rectal Not Given 02/05/19 0531)  acetaminophen (TYLENOL) tablet 1,000 mg (1,000 mg Oral Given 02/05/19 0449)  lidocaine (XYLOCAINE) 2 % jelly 1 application (1 application Topical Given 02/05/19 0504)  valACYclovir (VALTREX) tablet 1,000 mg (1,000 mg Oral Given 02/05/19 0535)     Initial Impression / Assessment and Plan / ED Course  Alisha Espinoza was initially angry but was then very apologetic.  We had a long discussion about Alisha Espinoza pain and regarding herpes which I believe is really the source of Alisha Espinoza ongoing rectal pain. We also discussed treatment of herpes, as well as Alisha Espinoza Xray results. I do not believe Alisha Espinoza is obstructed and I do not believe Alisha Espinoza needs a repeat CT scan at this time.  I believe Alisha Espinoza is likely withholding some stool secondary to pain from the herpes.  Moreover, I believe the hemorrhoid is irritated because of the herpes. I will start valtrex 1000 mg BID x 10 days and have Alisha Espinoza continued lidocaine jelly.  I have informed Alisha Espinoza of the liquid stool in the colon and the gas.  We have discussed having Alisha Espinoza stay on miralax therapy to keep stool soft so Alisha Espinoza is not straining and further irritating the hemorrhoid.  Alisha Espinoza may wish to discuss suppressive therapy with Alisha Espinoza family doctor.  I have discussed using baby wipes with  the Alisha Espinoza so as not to abrade the area. I think symptoms will improve on antivirals but have informed the Alisha Espinoza it may take a few days.  I have instructed close follow up with Alisha Espinoza PMD. Alisha Espinoza will continue home narcotic pain medication.  JENESYS CASSEUS was evaluated in Emergency Department on 02/05/2019 for the symptoms described in the history of present illness. Alisha Espinoza was evaluated in the context of the global COVID-19 pandemic, which necessitated consideration that the Alisha Espinoza might be at risk for infection with the SARS-CoV-2 virus that causes COVID-19. Institutional protocols and algorithms that pertain to the evaluation of patients at risk for COVID-19 are in a state of rapid change based on information released by regulatory bodies including the CDC and federal and state organizations. These policies and algorithms were followed during the Alisha Espinoza's care in the ED.   Final Clinical Impressions(s) / ED Diagnoses   Final diagnoses:  Herpes infection  Gas pain   Return for weakness, numbness, changes in vision or speech, fevers >100.4 unrelieved by medication, shortness of breath, intractable vomiting, or diarrhea, abdominal pain, Inability to tolerate liquids or food, cough, altered mental status or any concerns. No signs of systemic illness or infection. The Alisha Espinoza is nontoxic-appearing on exam and vital signs are within normal  limits.   I have reviewed the triage vital signs and the nursing notes. Pertinent labs &imaging results that were available during my care of the Alisha Espinoza were reviewed by me and considered in my medical decision making (see chart for details).  After history, exam, and medical workup I feel the Alisha Espinoza has been appropriately medically screened and is safe for discharge home. Pertinent diagnoses were discussed with the Alisha Espinoza. Alisha Espinoza was given return precautions  ED Discharge Orders         Ordered    valACYclovir (VALTREX) 1000 MG tablet  2 times daily      02/05/19 0535           Kaelin Holford, MD 02/05/19 1423

## 2019-02-05 NOTE — ED Triage Notes (Signed)
Patient reports constipation with rectal fissure and low abdominal pain this week , denies emesis or diarrhea , no fever or chills .

## 2019-02-07 ENCOUNTER — Emergency Department (HOSPITAL_COMMUNITY)
Admission: EM | Admit: 2019-02-07 | Discharge: 2019-02-07 | Disposition: A | Discharge status: Home / Self Care | Payer: BC Managed Care – PPO | Attending: Emergency Medicine | Admitting: Emergency Medicine

## 2019-02-07 ENCOUNTER — Other Ambulatory Visit: Payer: Self-pay

## 2019-02-07 ENCOUNTER — Encounter (HOSPITAL_COMMUNITY): Payer: Self-pay | Admitting: *Deleted

## 2019-02-07 DIAGNOSIS — E119 Type 2 diabetes mellitus without complications: Secondary | ICD-10-CM | POA: Diagnosis not present

## 2019-02-07 DIAGNOSIS — Z79899 Other long term (current) drug therapy: Secondary | ICD-10-CM | POA: Diagnosis not present

## 2019-02-07 DIAGNOSIS — I10 Essential (primary) hypertension: Secondary | ICD-10-CM | POA: Diagnosis not present

## 2019-02-07 DIAGNOSIS — F17210 Nicotine dependence, cigarettes, uncomplicated: Secondary | ICD-10-CM | POA: Diagnosis not present

## 2019-02-07 DIAGNOSIS — Z7984 Long term (current) use of oral hypoglycemic drugs: Secondary | ICD-10-CM | POA: Insufficient documentation

## 2019-02-07 DIAGNOSIS — K6289 Other specified diseases of anus and rectum: Secondary | ICD-10-CM | POA: Diagnosis not present

## 2019-02-07 DIAGNOSIS — K602 Anal fissure, unspecified: Secondary | ICD-10-CM | POA: Insufficient documentation

## 2019-02-07 MED ORDER — LIDOCAINE 5 % EX OINT: 1.0000 "application " | TOPICAL_OINTMENT | Freq: Three times a day (TID) | CUTANEOUS | 0 refills | Status: DC | PRN

## 2019-02-07 MED ORDER — LIDOCAINE VISCOUS HCL 2 % MT SOLN
15.0000 mL | Freq: Once | OROMUCOSAL | Status: DC
  Filled 2019-02-07: qty 15

## 2019-02-07 MED ORDER — LIDOCAINE HCL URETHRAL/MUCOSAL 2 % EX GEL
1.0000 "application " | Freq: Once | CUTANEOUS | Status: AC
  Administered 2019-02-07: 1 via TOPICAL
  Filled 2019-02-07: qty 5

## 2019-02-07 MED ORDER — LIDOCAINE VISCOUS HCL 2 % MT SOLN: 15.0000 mL | Freq: Four times a day (QID) | OROMUCOSAL | 0 refills | Status: DC | PRN

## 2019-02-07 NOTE — ED Triage Notes (Signed)
Pt states she has rectal pain ? Fissure or hemorrhoids, has not had BM since Thursday due to pain. Has 2 appointments this week to seek further advice

## 2019-02-07 NOTE — Progress Notes (Addendum)
02/07/2019 [name] [id number] 30-Oct-1965   History of Present Illness: Alisha Espinoza is a 53 year old female with a past medical history of hypertension, DM II, and colon polyps. She was seen in the office on 01/27/2019 by Nicoletta Ba PA-C with complaints of anorectal pain. She was prescribed Diltiazem with Lidocaine cream for a probable anal fissure. She was advised to take Miralax daily. Her symptoms did not improve. She went to [facility] 11[facility]01 with worsening anorectal pain. A rectal exam by the ER physician showed raised white lesions surrounding the anal area. No known history of anal/genital herpes. She was prescribed NTG 0.4% anal ointment and Lidocaine-Hydrocortisone cream.  Her anorectal pain persisted and she developed lower abdominal pain. She went back to [facility] on 02/05/2019, a rectal exam by the [facility] physician identified a small anal fissure, perianal herpetic ulcerations and a prolapsed hemorrhoid. She was prescribed Valacyclovir 1097m bid x 10 days. An abdominal xray was done  In the ER which showed fluid stool in the colon with moderate gaseous distension of the loops of the colon. No evidence of an obstructions. She was seen by her gynecologist yesterday, she reported her anal lesions were healing. Cultures for HSV were completed, results pending.  She presents today with complaints of severe burning rectal pain. She is having difficulty sitting for brief periods of time. She feels she needs to pass a BM but cannot. No rectal bleeding. No exudate from the rectum. She is taking Miralax daily. She feels as if she might have diarrhea. She is afraid to pass a BM because her rectal pain worsens during defecation. No fever, sweats or chills. History of colon polyps. Her most recent colonoscopy was done by Al[facility] 09/18/2018 which showed a TA polyp in the ascending colon and one HP polyp to the rectum and nonbleeding internal hemorrhoids. No evidence of proctitis  or colitis. She is taking Oxycodone po qid for chronic back pain. She is taking Naproxen 5538m2 tabs once daily for the rectal pain, last tool a dose last night.   Abdominal xray 02/05/2019: 1. No acute cardiopulmonary process. There is a small amount of atelectasis at the lung bases. 2. Air-fluid levels that appear to be primarily centered within the colon. Findings are consistent with liquid stool. There is some mild to moderate gaseous distention of loops of colon. No definite evidence for a high-grade small bowel obstruction. Short interval repeat x-ray may be useful for further evaluation.  Colonoscopy 09/18/2018 by Dr. NaSilverio Decamp- One 2 mm tubular adenomatous polyp in the ascending colon removed. - One 3 mm hyperplastic polyp in the rectum, removed with a cold snare. - Mild diverticulosis in the sigmoid colon and in the descending colon. Peri-diverticular erythema was seen. - Non-bleeding internal hemorrhoids. -Repeat colonoscopy in 5 years.  Colonoscopy 08/27/2017: - Four 8 to 18 mm polyps in the rectum, at the recto-sigmoid colon, in the sigmoid colon and in the transverse colon, removed with a hot snare. Resected and retrieved. - Two 4 to 7 mm polyps in the sigmoid colon and in the ascending colon, removed with a cold snare. Resected and retrieved. - Non-bleeding internal hemorrhoids. - The examination was otherwise normal. -Repeat colonoscopy in 1 year.   Current Medications, Allergies, Past Medical History, Past Surgical History, Family History and Social History were reviewed in [employer].  Current Outpatient Medications on File Prior to Visit  Medication Sig Dispense Refill  albuterol (PROVENTIL HFA;VENTOLIN HFA) 108 (90 Base) MCG/ACT inhaler Inhale 1-2 puffs into the lungs every 4 (four) hours as needed for wheezing or shortness of breath (or coughing). 1 Inhaler 1   Blood Glucose Monitoring Suppl (CONTOUR NEXT MONITOR) w/Device KIT 1  each by Does not apply route daily. Use to test blood sugars 1-2 times daily. 1 kit 0   glucose blood (CONTOUR NEXT TEST) test strip Use to test 1-2 times daily. 100 each 12   lidocaine (XYLOCAINE) 5 % ointment Apply 1 application topically 3 (three) times daily as needed. 240 g 0   metFORMIN (GLUCOPHAGE) 500 MG tablet Take 1 tablet (500 mg total) by mouth 2 (two) times daily with a meal. 180 tablet 3   Microlet Lancets MISC Use to test blood sugars 1-2 times daily. 100 each 12   naproxen sodium (ANAPROX) 550 MG tablet Take 1 tablet by mouth 2 (two) times daily.     oxyCODONE (OXY IR/ROXICODONE) 5 MG immediate release tablet Take 1 tablet by mouth every 6 (six) hours as needed.     simvastatin (ZOCOR) 20 MG tablet Take 20 mg by mouth daily.  1   tiZANidine (ZANAFLEX) 4 MG tablet Take 1 tablet by mouth every 8 (eight) hours as needed.     TRELEGY ELLIPTA 100-62.5-25 MCG/INH AEPB Inhale 1 puff into the lungs daily.     No current facility-administered medications on file prior to visit.       Physical Exam: BP (!) 142/80    Pulse 77    Temp (!) 96.6 F (35.9 C)    Ht _0  (1.676 m)    Wt 193 lb 9.6 oz (87.8 kg)    SpO2 98%    BMI 31.25 kg/m    General: Well developed female pacing in the room, unable to sit down due to rectal pain Head: Normocephalic and atraumatic Eyes:  sclerae anicteric, conjunctiva pink  Ears: Normal auditory acuity Lungs: Clear throughout to auscultation Heart: Regular rate and rhythm Abdomen: Soft, non tender and non distended. No masses, no hepatomegaly. Hyperactive bowel sounds Rectal: Two small circular flat lesions left perianal area, no exudate, these lesions are not consistent with herpetic lesions, no fistula. Internal anorectal exam was attempted, finger tip inside the anal opening did not pass the anal sphincter due to pain and the patient asked that I stop the exam. A rectal exam with anoscopy or flex sig with sedation required. [name] CNA present  to chaperone.  Musculoskeletal: Symmetrical with no gross deformities  Extremities: No edema  Neurological: Alert oriented x 4, grossly nonfocal Psychological:  Alert and cooperative. Normal mood and affect  Assessment and Recommendations:  1. Rectal pain, possible anal fissure with abscess -Patient will need anoscopy or flexible sigmoidoscopy with sedation. I contacted colorectal surgeon Dr. Johney Maine, his physician assistant will see the patient today at 1:45pm.  -Augmentin 878m po bid x 7 days prescribed for possible anorectal abscess (this was prescribed prior to confirming appointment with colorectal surgeon PA-C) -Await further recommendations from colorectal surgery PA-C and Dr. GJohney Maine  -May need abd/pelvic/rectal CT -Further follow up to be determined after colorectal evaluation completed   2. 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 History of colon polyps -Next colonoscopy due June 2025   ADDENDUM: Patient presented back to the office today around 4:15pm after she was seen by the colorectal surgical PA-C. She presented tearful stating she was prescribed the same NTG fissure cream she previously using. She remains  with significant rectal pain. She cannot sit down due to this pain. She is tearful. I consulted with Dr. Tarri Glenn supervising physician and she recommended continue use of NTG ointment for presumed fissure care tid for the next 6 to 8 weeks, Miralax daily, Sitz bath tid.  The patient stated she was inserting the NTG fissure cream at least 1 inch inside the anal opening and to the external anal area 3 to 4 times daily as prescribed. The patient has an appointment to see colorectal surgeon on Tuesday 11/17. I advised the patient if her rectal pain worsens despite these instructions then she should proceed to [facility] or [facility] ER for an abd/pelvic/rectal CT and for colorectal surgeon consult.

## 2019-02-07 NOTE — ED Provider Notes (Signed)
[facility] DEPT Provider Note   CSN: [id number] Arrival date & time: 02/07/19  1819     History   Chief Complaint Chief Complaint  Patient presents with  . Rectal Pain    HPI Alisha Espinoza is a 53 y.o. female hx of DM, HL, here presenting with rectal pain.  Is been going on for the last several weeks.  Patient had a CT abdomen pelvis that showed proctitis.  Patient was seen in the ED 2 days ago and was thought to have herpes and started on Valtrex.  Patient states that Alisha [facility]inoza is constipated and Alisha [facility]inoza is afraid to have a bowel movement because it hurts.  Patient denies any drainage or fevers.  Patient is requesting getting viscous lidocaine for comfort.  Patient states that [facility] was prescribed Nitropaste but it was so expensive Al[facility]inoza was unable to fill it due to insurance reasons.  Alisha Espinoza also tried Anusol with no relief. Alisha Espinoza actually has GI follow-up in 2 days but just wanted some relief for her rectal pain.      The history is provided by the patient.    Past Medical History:  Diagnosis Date  . Bronchitis   . Diabetes mellitus ([place]) 01/19/2019  . Dyspnea   . Heart murmur    resolved "closed by age 65."   . Hyperlipidemia   . Seasonal allergies     Patient Active Problem List   Diagnosis Date Noted  . Hemorrhoids 01/21/2019  . Essential hypertension 01/21/2019  . Serum calcium elevated 01/21/2019  . Elevated cholesterol 01/19/2019  . Diabetes mellitus ([place]) 01/19/2019  . 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 Acute bronchitis 08/08/2015  . Leukocytosis 08/08/2015  . Hyperglycemia 08/08/2015  . Tobacco abuse 08/08/2015  . 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 BACTERIAL PNEUMONIA, RIGHT LOWER LOBE 04/27/2008  . LUMP OR MASS IN BREAST 04/27/2008  . COUGH 04/27/2008    Past Surgical History:  Procedure Laterality Date  . ABDOMINAL HYSTERECTOMY  2000  . ANTERIOR CERVICAL DECOMP/DISCECTOMY FUSION  03/19/2012   Procedure: ANTERIOR  CERVICAL DECOMPRESSION/DISCECTOMY FUSION 1 LEVEL;  Surgeon: Melina Schools, MD;  Location: [facility];  Service: Orthopedics;  Laterality: Left;  Total Disc Replacement C4-5  . ANTERIOR CERVICAL DECOMP/DISCECTOMY FUSION N/A 05/07/2012   Procedure: ANTERIOR CERVICAL DECOMPRESSION/DISCECTOMY FUSION 1 LEVEL/HARDWARE REMOVAL;  Surgeon: Melina Schools, MD;  Location: [place] [facility];  Service: Orthopedics;  Laterality: N/A;  REMOVAL OF CERVICAL DISC REPLACEMENT AND ACDF C4-5  . BACK SURGERY    . CARPAL TUNNEL RELEASE Right 09/28/2018   Procedure: CARPAL TUNNEL RELEASE;  Surgeon: Eustace Moore, MD;  Location: [place];  Service: Neurosurgery;  Laterality: Right;  CARPAL TUNNEL RELEASE  . CERVICAL FUSION  05/07/2012   Dr Rolena Infante  . COLONOSCOPY    . CYSTECTOMY     L wrist  . POLYPECTOMY    . ROTATOR CUFF REPAIR     Right  . TYMPANOSTOMY TUBE PLACEMENT       OB History   No obstetric history on file.      Home Medications    Prior to Admission medications   Medication Sig Start Date End Date Taking? Authorizing Provider  albuterol (PROVENTIL HFA;VENTOLIN HFA) 108 (90 Base) MCG/ACT inhaler Inhale 1-2 puffs into the lungs every 4 (four) hours as needed for wheezing or shortness of breath (or coughing). 5/95/63   Delora Fuel, MD  AMBULATORY NON FORMULARY MEDICATION Medication Name: Diltiazem Ointment 2%/ Lidocaine 5% Insert 1 inch into anus  four times daily 01/27/19   Esterwood, Amy S, PA-C  Blood Glucose Monitoring Suppl (CONTOUR NEXT MONITOR) w/Device KIT 1 each by Does not apply route daily. Use to test blood sugars 1-2 times daily. 02/02/19   Libby Maw, MD  glucose blood (CONTOUR NEXT TEST) test strip Use to test 1-2 times daily. 02/02/19   Libby Maw, MD  Lidocaine-Hydrocortisone Ace 3-0.5 % KIT Place 0.5 inches rectally as needed (use pea sized amount in anus/around anus before a bowel movement). 01/31/19   Kinnie Feil, PA-C  metFORMIN (GLUCOPHAGE) 500 MG tablet Take 1 tablet (500 mg  total) by mouth 2 (two) times daily with a meal. 01/21/19   Libby Maw, MD  Microlet Lancets MISC Use to test blood sugars 1-2 times daily. 02/02/19   Libby Maw, MD  naproxen sodium (ANAPROX) 550 MG tablet Take 1 tablet by mouth 2 (two) times daily. 01/18/19   [provider]  Nitroglycerin 0.4 % OINT Place 0.5 inches rectally as needed. Apply pea sized amount in/around anus prior to bowel movements 01/31/19   Kinnie Feil, PA-C  oxyCODONE (OXY IR/ROXICODONE) 5 MG immediate release tablet Take 1 tablet by mouth every 6 (six) hours as needed. 12/03/18   [provider]  simvastatin (ZOCOR) 20 MG tablet Take 20 mg by mouth daily. 01/26/18   [provider]  tiZANidine (ZANAFLEX) 4 MG tablet Take 1 tablet by mouth every 8 (eight) hours as needed. 01/04/19   [provider]  TRELEGY ELLIPTA 100-62.5-25 MCG/INH AEPB Inhale 1 puff into the lungs daily. 01/04/19   [provider]  valACYclovir (VALTREX) 1000 MG tablet Take 1 tablet (1,000 mg total) by mouth 2 (two) times daily for 10 days. 02/05/19 02/15/19  Palumbo, April, MD    Family History Family History  Problem Relation Age of Onset  . Diabetes Mother   . Hypertension Father   . Pancreatic cancer Sister   . Colon cancer Neg Hx   . Rectal cancer Neg Hx   . Stomach cancer Neg Hx   . Colon polyps Neg Hx   . Esophageal cancer Neg Hx     Social History Social History   Tobacco Use  . Smoking status: Current Every Day Smoker    Packs/day: 0.50    Years: 30.00    Pack years: 15.00    Types: Cigarettes  . Smokeless tobacco: Never Used  Substance Use Topics  . Alcohol use: Yes    Comment: 1-2 beers on occsion.   . Drug use: No     Allergies   Meloxicam and Mobic [meloxicam]   Review of Systems Review of Systems  Gastrointestinal: Positive for rectal pain.  All other systems reviewed and are negative.    Physical Exam Updated Vital Signs BP 124/87 (BP  Location: Right Arm)   Pulse 89   Temp 98.2 F (36.8 C) (Oral)   Resp 18   Ht _0  (1.676 m)   Wt 88.5 kg   SpO2 97%   BMI 31.47 kg/m   Physical Exam Vitals signs and nursing note reviewed.  Constitutional:      Appearance: Normal appearance.  HENT:     Head: Normocephalic.     Nose: Nose normal.     Mouth/Throat:     Mouth: Mucous membranes are moist.  Eyes:     Extraocular Movements: Extraocular movements intact.     Pupils: Pupils are equal, round, and reactive to light.  Neck:  Musculoskeletal: Normal range of motion.  Cardiovascular:     Rate and Rhythm: Normal rate and regular rhythm.     Pulses: Normal pulses.     Heart sounds: Normal heart sounds.  Pulmonary:     Effort: Pulmonary effort is normal.     Breath sounds: Normal breath sounds.  Abdominal:     General: Abdomen is flat.     Palpations: Abdomen is soft.  Genitourinary:    Comments: Rectal- anal fissure, ? External hemorrhoid, not thrombosed  Musculoskeletal: Normal range of motion.  Skin:    General: Skin is warm.     Capillary Refill: Capillary refill takes less than 2 seconds.  Neurological:     General: No focal deficit present.     Mental Status: Alisha [facility]za is alert.  Psychiatric:        Mood and Affect: Mood normal.        Behavior: Behavior normal.      ED Treatments / Results  Labs (all labs ordered are listed, but only abnormal results are displayed) Labs Reviewed - No data to display  EKG None  Radiology No results found.  Procedures Procedures (including critical care time)  Medications Ordered in ED Medications  lidocaine (XYLOCAINE) 2 % viscous mouth solution 15 mL (has no administration in time range)     Initial Impression / Assessment and Plan / ED Course  I have reviewed the triage vital signs and the nursing notes.  Pertinent labs & imaging results that were available during my care of the patient were reviewed by me and considered in my medical decision making  (see chart for details).       CHRYSTEL BAREFIELD is a 53 y.o. female here with rectal pain.  Patient actually had extensive work-up for this already.  Alisha Espinoza had a CT abdomen pelvis and x-rays and lab work. Alisha [facility]za was diagnosed with anal fissure and hemorrhoids and most recently herpes and is currently on Valtrex .  Patient is afraid to have a bowel movement due to pain. Will prescribe viscous lidocaine and I encouraged her to try enema afterwards. Told her to follow up with GI as scheduled in 2 days.    Final Clinical Impressions(s) / ED Diagnoses   Final diagnoses:  None    ED Discharge Orders    None       Drenda Freeze, MD 02/07/19 1931

## 2019-02-07 NOTE — Discharge Instructions (Signed)
Try viscous lidocaine and then give yourself an enema   See your GI doctor as scheduled   Return to ER if you have worse rectal pain, vomiting, fever.

## 2019-02-08 DIAGNOSIS — N9089 Other specified noninflammatory disorders of vulva and perineum: Secondary | ICD-10-CM | POA: Diagnosis not present

## 2019-02-08 DIAGNOSIS — B009 Herpesviral infection, unspecified: Secondary | ICD-10-CM | POA: Diagnosis not present

## 2019-02-08 DIAGNOSIS — H3589 Other specified retinal disorders: Secondary | ICD-10-CM | POA: Diagnosis not present

## 2019-02-08 DIAGNOSIS — H25013 Cortical age-related cataract, bilateral: Secondary | ICD-10-CM | POA: Diagnosis not present

## 2019-02-08 DIAGNOSIS — E119 Type 2 diabetes mellitus without complications: Secondary | ICD-10-CM | POA: Diagnosis not present

## 2019-02-08 DIAGNOSIS — H40013 Open angle with borderline findings, low risk, bilateral: Secondary | ICD-10-CM | POA: Diagnosis not present

## 2019-02-08 LAB — HM DIABETES EYE EXAM

## 2019-02-09 ENCOUNTER — Encounter: Payer: Self-pay | Admitting: Nurse Practitioner

## 2019-02-09 ENCOUNTER — Ambulatory Visit (INDEPENDENT_AMBULATORY_CARE_PROVIDER_SITE_OTHER): Payer: BC Managed Care – PPO | Admitting: Nurse Practitioner

## 2019-02-09 ENCOUNTER — Other Ambulatory Visit: Payer: Self-pay

## 2019-02-09 ENCOUNTER — Other Ambulatory Visit (INDEPENDENT_AMBULATORY_CARE_PROVIDER_SITE_OTHER): Payer: BC Managed Care – PPO

## 2019-02-09 VITALS — BP 142/80 | HR 77 | Temp 96.6°F | Ht 66.0 in | Wt 193.6 lb

## 2019-02-09 DIAGNOSIS — K602 Anal fissure, unspecified: Secondary | ICD-10-CM

## 2019-02-09 DIAGNOSIS — K6289 Other specified diseases of anus and rectum: Secondary | ICD-10-CM | POA: Diagnosis not present

## 2019-02-09 LAB — CBC WITH DIFFERENTIAL/PLATELET
Basophils Absolute: 0.1 10*3/uL (ref 0.0–0.1)
Basophils Relative: 2 % (ref 0.0–3.0)
Eosinophils Absolute: 0.3 10*3/uL (ref 0.0–0.7)
Eosinophils Relative: 4.3 % (ref 0.0–5.0)
HCT: 45.2 % (ref 36.0–46.0)
Hemoglobin: 15 g/dL (ref 12.0–15.0)
Lymphocytes Relative: 39.2 % (ref 12.0–46.0)
Lymphs Abs: 2.4 10*3/uL (ref 0.7–4.0)
MCHC: 33.2 g/dL (ref 30.0–36.0)
MCV: 79 fl (ref 78.0–100.0)
Monocytes Absolute: 0.5 10*3/uL (ref 0.1–1.0)
Monocytes Relative: 8.3 % (ref 3.0–12.0)
Neutro Abs: 2.8 10*3/uL (ref 1.4–7.7)
Neutrophils Relative %: 46.2 % (ref 43.0–77.0)
Platelets: 193 10*3/uL (ref 150.0–400.0)
RBC: 5.72 Mil/uL — ABNORMAL HIGH (ref 3.87–5.11)
RDW: 16.7 % — ABNORMAL HIGH (ref 11.5–15.5)
WBC: 6.1 10*3/uL (ref 4.0–10.5)

## 2019-02-09 LAB — HIGH SENSITIVITY CRP: CRP, High Sensitivity: 1.67 mg/L (ref 0.000–5.000)

## 2019-02-09 MED ORDER — AMOXICILLIN-POT CLAVULANATE 875-125 MG PO TABS: 1.0000 | ORAL_TABLET | Freq: Two times a day (BID) | ORAL | 0 refills | Status: DC

## 2019-02-09 NOTE — Patient Instructions (Addendum)
Your provider has requested that you go to the basement level for lab work before leaving today. Press "B" on the elevator. The lab is located at the first door on the left as you exit the elevator.  We have sent the following medications to your pharmacy for you to pick up at your convenience:  Augmentin  We will consult with Dr. Johney Maine at [facility] asap.  If symptoms get worse, go to the ER  You have been scheduled for an appointment with Ms. Randa Evens at [facility]. Your appointment is on 02/09/19 at 2:00pm. Please arrive at 1:45pm for registration. Make certain to bring a list of current medications, including any over the counter medications or vitamins. Also bring your co-pay if you have one as well as your insurance cards. [facility] is located at [address]. Should you need to reschedule your appointment, please contact them at [phone].

## 2019-02-10 ENCOUNTER — Other Ambulatory Visit: Payer: Self-pay

## 2019-02-10 ENCOUNTER — Telehealth: Payer: Self-pay | Admitting: Nurse Practitioner

## 2019-02-10 ENCOUNTER — Encounter (HOSPITAL_COMMUNITY): Payer: Self-pay | Admitting: Emergency Medicine

## 2019-02-10 ENCOUNTER — Emergency Department (HOSPITAL_COMMUNITY)
Admission: EM | Admit: 2019-02-10 | Discharge: 2019-02-10 | Disposition: A | Discharge status: Home / Self Care | Payer: BC Managed Care – PPO | Attending: Emergency Medicine | Admitting: Emergency Medicine

## 2019-02-10 ENCOUNTER — Emergency Department (HOSPITAL_COMMUNITY): Payer: BC Managed Care – PPO

## 2019-02-10 DIAGNOSIS — Z7984 Long term (current) use of oral hypoglycemic drugs: Secondary | ICD-10-CM | POA: Diagnosis not present

## 2019-02-10 DIAGNOSIS — K6289 Other specified diseases of anus and rectum: Secondary | ICD-10-CM

## 2019-02-10 DIAGNOSIS — F17210 Nicotine dependence, cigarettes, uncomplicated: Secondary | ICD-10-CM | POA: Insufficient documentation

## 2019-02-10 DIAGNOSIS — K5900 Constipation, unspecified: Secondary | ICD-10-CM | POA: Insufficient documentation

## 2019-02-10 DIAGNOSIS — E119 Type 2 diabetes mellitus without complications: Secondary | ICD-10-CM | POA: Diagnosis not present

## 2019-02-10 DIAGNOSIS — Z79899 Other long term (current) drug therapy: Secondary | ICD-10-CM | POA: Insufficient documentation

## 2019-02-10 LAB — CBC WITH DIFFERENTIAL/PLATELET
Abs Immature Granulocytes: 0.01 10*3/uL (ref 0.00–0.07)
Basophils Absolute: 0.1 10*3/uL (ref 0.0–0.1)
Basophils Relative: 1 %
Eosinophils Absolute: 0.3 10*3/uL (ref 0.0–0.5)
Eosinophils Relative: 4 %
HCT: 47.9 % — ABNORMAL HIGH (ref 36.0–46.0)
Hemoglobin: 15.5 g/dL — ABNORMAL HIGH (ref 12.0–15.0)
Immature Granulocytes: 0 %
Lymphocytes Relative: 47 %
Lymphs Abs: 3 10*3/uL (ref 0.7–4.0)
MCH: 26.5 pg (ref 26.0–34.0)
MCHC: 32.4 g/dL (ref 30.0–36.0)
MCV: 81.7 fL (ref 80.0–100.0)
Monocytes Absolute: 0.5 10*3/uL (ref 0.1–1.0)
Monocytes Relative: 7 %
Neutro Abs: 2.6 10*3/uL (ref 1.7–7.7)
Neutrophils Relative %: 41 %
Platelets: 199 10*3/uL (ref 150–400)
RBC: 5.86 MIL/uL — ABNORMAL HIGH (ref 3.87–5.11)
RDW: 17.9 % — ABNORMAL HIGH (ref 11.5–15.5)
WBC: 6.4 10*3/uL (ref 4.0–10.5)
nRBC: 0 % (ref 0.0–0.2)

## 2019-02-10 LAB — COMPREHENSIVE METABOLIC PANEL
ALT: 22 U/L (ref 0–44)
AST: 24 U/L (ref 15–41)
Albumin: 4.5 g/dL (ref 3.5–5.0)
Alkaline Phosphatase: 55 U/L (ref 38–126)
Anion gap: 11 (ref 5–15)
BUN: 6 mg/dL (ref 6–20)
CO2: 25 mmol/L (ref 22–32)
Calcium: 10 mg/dL (ref 8.9–10.3)
Chloride: 104 mmol/L (ref 98–111)
Creatinine, Ser: 0.68 mg/dL (ref 0.44–1.00)
GFR calc Af Amer: >60 mL/min (ref >60)
GFR calc non Af Amer: >60 mL/min (ref >60)
Glucose, Bld: 137 mg/dL — ABNORMAL HIGH (ref 70–99)
Potassium: 3.7 mmol/L (ref 3.5–5.1)
Sodium: 140 mmol/L (ref 135–145)
Total Bilirubin: 0.8 mg/dL (ref 0.3–1.2)
Total Protein: 7.3 g/dL (ref 6.5–8.1)

## 2019-02-10 MED ORDER — LIDOCAINE 5 % EX OINT: 1.0000 "application " | TOPICAL_OINTMENT | Freq: Three times a day (TID) | CUTANEOUS | 0 refills | Status: DC | PRN

## 2019-02-10 MED ORDER — IBUPROFEN 800 MG PO TABS: 800.0000 mg | ORAL_TABLET | Freq: Three times a day (TID) | ORAL | 0 refills | Status: DC | PRN

## 2019-02-10 MED ORDER — IOHEXOL 300 MG/ML  SOLN
100.0000 mL | Freq: Once | INTRAMUSCULAR | Status: AC | PRN
  Administered 2019-02-10: 100 mL via INTRAVENOUS

## 2019-02-10 MED ORDER — SODIUM CHLORIDE (PF) 0.9 % IJ SOLN
INTRAMUSCULAR | Status: AC
  Filled 2019-02-10: qty 50

## 2019-02-10 MED ORDER — ONDANSETRON 4 MG PO TBDP
4.0000 mg | ORAL_TABLET | Freq: Once | ORAL | Status: AC
  Administered 2019-02-10: 4 mg via ORAL
  Filled 2019-02-10: qty 1

## 2019-02-10 MED ORDER — MORPHINE SULFATE (PF) 4 MG/ML IV SOLN
4.0000 mg | Freq: Once | INTRAVENOUS | Status: AC
  Administered 2019-02-10: 4 mg via INTRAVENOUS
  Filled 2019-02-10: qty 1

## 2019-02-10 MED ORDER — DOCUSATE SODIUM 100 MG PO CAPS: 100.0000 mg | ORAL_CAPSULE | Freq: Every day | ORAL | 2 refills | Status: DC | PRN

## 2019-02-10 MED ORDER — IBUPROFEN 800 MG PO TABS
800.0000 mg | ORAL_TABLET | Freq: Once | ORAL | Status: AC
  Administered 2019-02-10: 800 mg via ORAL
  Filled 2019-02-10: qty 1

## 2019-02-10 NOTE — ED Provider Notes (Signed)
[facility] DEPT Provider Note   CSN: [id number] Arrival date & time: 02/10/19  1415     History   Chief Complaint Chief Complaint  Patient presents with  . Rectal Pain    HPI Alisha Espinoza is a 53 y.o. female.     Alisha Espinoza is a 53 y.o. female with a history of diabetes, hyperlipidemia, hypertension, who presents to the ED for evaluation of rectal pain.  She has been seen in the ED recently for similar and yesterday was seen by [facility] and as well as colorectal surgery PA, currently is being treated with nitroglycerin ointment as well as stool softeners.  Patient does report that she has been constipated due to pain with bowel movement.  She reports that today rectal pain and burning became so severe she called back the GI office and they recommended she come here for further evaluation.  On review of notes and documentation they recommend a CT of the abdomen and pelvis for further evaluation and to rule out rectal abscess.  Patient reports that she has been using medications regularly as directed.  She denies any bleeding.     Past Medical History:  Diagnosis Date  . Bronchitis   . Diabetes mellitus ([place]) 01/19/2019  . Dyspnea   . Heart murmur    resolved "closed by age 80."   . Hyperlipidemia   . Seasonal allergies     Patient Active Problem List   Diagnosis Date Noted  . Hemorrhoids 01/21/2019  . Essential hypertension 01/21/2019  . Serum calcium elevated 01/21/2019  . Elevated cholesterol 01/19/2019  . Diabetes mellitus ([place]) 01/19/2019  . 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 Acute bronchitis 08/08/2015  . Leukocytosis 08/08/2015  . Hyperglycemia 08/08/2015  . Tobacco abuse 08/08/2015  . 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 BACTERIAL PNEUMONIA, RIGHT LOWER LOBE 04/27/2008  . LUMP OR MASS IN BREAST 04/27/2008  . COUGH 04/27/2008    Past Surgical History:  Procedure Laterality Date  . ABDOMINAL  HYSTERECTOMY  2000  . ANTERIOR CERVICAL DECOMP/DISCECTOMY FUSION  03/19/2012   Procedure: ANTERIOR CERVICAL DECOMPRESSION/DISCECTOMY FUSION 1 LEVEL;  Surgeon: Melina Schools, MD;  Location: [facility];  Service: Orthopedics;  Laterality: Left;  Total Disc Replacement C4-5  . ANTERIOR CERVICAL DECOMP/DISCECTOMY FUSION N/A 05/07/2012   Procedure: ANTERIOR CERVICAL DECOMPRESSION/DISCECTOMY FUSION 1 LEVEL/HARDWARE REMOVAL;  Surgeon: Melina Schools, MD;  Location: [facility];  Service: Orthopedics;  Laterality: N/A;  REMOVAL OF CERVICAL DISC REPLACEMENT AND ACDF C4-5  . BACK SURGERY    . CARPAL TUNNEL RELEASE Right 09/28/2018   Procedure: CARPAL TUNNEL RELEASE;  Surgeon: Eustace Moore, MD;  Location: [facility];  Service: Neurosurgery;  Laterality: Right;  CARPAL TUNNEL RELEASE  . CERVICAL FUSION  05/07/2012   Dr Rolena Infante  . COLONOSCOPY    . CYSTECTOMY     L wrist  . POLYPECTOMY    . ROTATOR CUFF REPAIR     Right  . TYMPANOSTOMY TUBE PLACEMENT       OB History   No obstetric history on file.      Home Medications    Prior to Admission medications   Medication Sig Start Date End Date Taking? Authorizing Provider  albuterol (PROVENTIL HFA;VENTOLIN HFA) 108 (90 Base) MCG/ACT inhaler Inhale 1-2 puffs into the lungs every 4 (four) hours as needed for wheezing or shortness of breath (or coughing). [birth year]   Delora Fuel, MD  amoxicillin-clavulanate (AUGMENTIN) 875-125 MG tablet Take 1 tablet by  mouth 2 (two) times daily. 02/09/19   Noralyn Pick, NP  Blood Glucose Monitoring Suppl (CONTOUR NEXT MONITOR) w/Device KIT 1 each by Does not apply route daily. Use to test blood sugars 1-2 times daily. 02/02/19   Libby Maw, MD  glucose blood (CONTOUR NEXT TEST) test strip Use to test 1-2 times daily. 02/02/19   Libby Maw, MD  lidocaine (XYLOCAINE) 5 % ointment Apply 1 application topically 3 (three) times daily as needed. 02/07/19   Drenda Freeze, MD  metFORMIN (GLUCOPHAGE) 500 MG  tablet Take 1 tablet (500 mg total) by mouth 2 (two) times daily with a meal. 01/21/19   Libby Maw, MD  Microlet Lancets MISC Use to test blood sugars 1-2 times daily. 02/02/19   Libby Maw, MD  naproxen sodium (ANAPROX) 550 MG tablet Take 1 tablet by mouth 2 (two) times daily. 01/18/19   [provider]  oxyCODONE (OXY IR/ROXICODONE) 5 MG immediate release tablet Take 1 tablet by mouth every 6 (six) hours as needed. 12/03/18   [provider]  simvastatin (ZOCOR) 20 MG tablet Take 20 mg by mouth daily. 01/26/18   [provider]  tiZANidine (ZANAFLEX) 4 MG tablet Take 1 tablet by mouth every 8 (eight) hours as needed. 01/04/19   [provider]  TRELEGY ELLIPTA 100-62.5-25 MCG/INH AEPB Inhale 1 puff into the lungs daily. 01/04/19   [provider]    Family History Family History  Problem Relation Age of Onset  . Diabetes Mother   . Hypertension Father   . Pancreatic cancer Sister   . Colon cancer Neg Hx   . Rectal cancer Neg Hx   . Stomach cancer Neg Hx   . Colon polyps Neg Hx   . Esophageal cancer Neg Hx     Social History Social History   Tobacco Use  . Smoking status: Current Every Day Smoker    Packs/day: 0.50    Years: 30.00    Pack years: 15.00    Types: Cigarettes  . Smokeless tobacco: Never Used  Substance Use Topics  . Alcohol use: Yes    Comment: 1-2 beers on occsion.   . Drug use: No     Allergies   Meloxicam and Mobic [meloxicam]   Review of Systems Review of Systems  Constitutional: Negative for chills and fever.  Respiratory: Negative for cough and shortness of breath.   Cardiovascular: Negative for chest pain.  Gastrointestinal: Positive for constipation and rectal pain. Negative for abdominal pain, blood in stool, diarrhea, nausea and vomiting.  All other systems reviewed and are negative.    Physical Exam Updated Vital Signs BP (!) 141/103   Pulse 92   Temp 98.7 F (37.1 C)  (Oral)   Resp 16   SpO2 98%   Physical Exam Vitals signs and nursing note reviewed.  Constitutional:      General: She is not in acute distress.    Appearance: Normal appearance. She is well-developed and normal weight. She is not diaphoretic.     Comments: Patient appears uncomfortable, pacing in room, but is well-appearing  HENT:     Head: Normocephalic and atraumatic.  Eyes:     General:        Right eye: No discharge.        Left eye: No discharge.  Neck:     Musculoskeletal: Neck supple.  Cardiovascular:     Rate and Rhythm: Normal rate and regular rhythm.     Heart  sounds: Normal heart sounds.  Pulmonary:     Effort: Pulmonary effort is normal. No respiratory distress.     Breath sounds: Normal breath sounds. No wheezing or rales.  Abdominal:     General: Bowel sounds are normal. There is no distension.     Palpations: Abdomen is soft. There is no mass.     Tenderness: There is no abdominal tenderness. There is no guarding.     Comments: Abdomen soft, nondistended, nontender to palpation in all quadrants without guarding or peritoneal signs  Genitourinary:    Comments: External rectal exam reveals a small nonthrombosed external hemorrhoid.  Internal rectal exam not tolerated by patient due to pain. Musculoskeletal:        General: No deformity.  Skin:    General: Skin is warm and dry.     Capillary Refill: Capillary refill takes less than 2 seconds.  Neurological:     Mental Status: She is alert and oriented to person, place, and time.     Coordination: Coordination normal.     Comments: Speech is clear, able to follow commands Moves extremities without ataxia, coordination intact  Psychiatric:        Mood and Affect: Mood normal.        Behavior: Behavior normal.      ED Treatments / Results  Labs (all labs ordered are listed, but only abnormal results are displayed) Labs Reviewed  COMPREHENSIVE METABOLIC PANEL - Abnormal; Notable for the following  components:      Result Value   Glucose, Bld 137 (*)    All other components within normal limits  CBC WITH DIFFERENTIAL/PLATELET - Abnormal; Notable for the following components:   RBC 5.86 (*)    Hemoglobin 15.5 (*)    HCT 47.9 (*)    RDW 17.9 (*)    All other components within normal limits    EKG None  Radiology No results found.  Procedures Procedures (including critical care time)  Medications Ordered in ED Medications  sodium chloride (PF) 0.9 % injection (has no administration in time range)  morphine 4 MG/ML injection 4 mg (4 mg Intravenous Given 02/10/19 1935)  ondansetron (ZOFRAN-ODT) disintegrating tablet 4 mg (4 mg Oral Given 02/10/19 1928)  iohexol (OMNIPAQUE) 300 MG/ML solution 100 mL (100 mLs Intravenous Contrast Given 02/10/19 2052)     Initial Impression / Assessment and Plan / ED Course  I have reviewed the triage vital signs and the nursing notes.  Pertinent labs & imaging results that were available during my care of the patient were reviewed by me and considered in my medical decision making (see chart for details).  53 year old female presents with severe rectal pain has been treated for anal fissure and hemorrhoids recently, has been seen by GI as well as colorectal surgery PA and is currently being treated with nitroglycerin ointment.  Patient called GI office due to worsening rectal pain and burning today and was sent to the emergency department and review of their notes they wanted the patient to have a CT of the abdomen and pelvis to assess for potential associated abscess or other abnormality, concern patient may need further surgical consultation.  On exam patient is uncomfortable, she has no focal abdominal tenderness but does report issues with constipation due to rectal pain.  External exam of the rectum reveals a nonthrombosed external hemorrhoid, patient did not tolerate internal rectal exam due to pain.  Labs and CT abdomen pelvis ordered as well  as pain medication.  Lab  work shows no leukocytosis, no significant electrolyte derangements, normal renal and liver function.  CT pending.  Discussed plan with patient.  At shift change care signed out to [place] who will follow up on patient's CT results if there is any evidence of abscess or other abnormality requiring acute surgical intervention patient will need surgical consult, she already has appointment scheduled to see Dr. Johney Maine in the office on 11/17 and has been following up with [facility] who sent her here today.  Final Clinical Impressions(s) / ED Diagnoses   Final diagnoses:  Rectal pain    ED Discharge Orders    None       Janet Berlin 02/10/19 2146    Dorie Rank, MD 02/11/19 [phone]

## 2019-02-10 NOTE — ED Provider Notes (Signed)
Pt's care assumed from [facility] PA at 9pm.  Ct scan shows no evidence of rectal abscess.  Pt is advised to continue current medications and follow up with GI as scheduled    [name] 02/10/19 2136    Daleen Bo, MD 02/10/19 [phone]

## 2019-02-10 NOTE — Discharge Instructions (Signed)
Follow up with your Physician as scheduled.  Continue current pain medications

## 2019-02-10 NOTE — ED Notes (Signed)
Called to triage-- no response

## 2019-02-10 NOTE — Telephone Encounter (Signed)
Called and spoke with patient- patient reports she is "not able to handle this-the burning is so bad"; RN advised patient that if the burning and pain have increased since her office visit- she is advised to go to the ER-patient reports she will go to the ER but she "wants a GI doctor there so they can help me"-patient was informed there is a provider on call at all times and if the ER deems it appropriate they will page the on-call MD; patient reports she is going to the ER now-Patient advised to call back to the office at [phone] should questions/concerns arise;  Patient verbalized understanding of information/instructions;

## 2019-02-10 NOTE — Telephone Encounter (Signed)
Please review previous message and advise if alternative treatment, besides returning to the ER, is appropriate

## 2019-02-10 NOTE — Telephone Encounter (Signed)
I called the patient and she verified she is going to [facility] emergency room.  I called the emergency room and spoke to Dr. Zenia Resides.  I reviewed the patient's office visit yesterday with her persistent significant rectal pain.  I recommended the patient should have an abdominal/pelvic CT with focus to the colon and rectum.  If a colorectal surgeon currently is to be consulted to assess the patient.  She perhaps may need a flexible sigmoidoscopy with sedation if colorectal surgery cannot see her.

## 2019-02-10 NOTE — ED Triage Notes (Signed)
Pt complaint of rectal pain with hx of same with "hemorrhoids or fissures." Seen recently for same.

## 2019-02-10 NOTE — ED Notes (Signed)
Patient transported to CT 

## 2019-02-10 NOTE — Telephone Encounter (Signed)
Pt called back and was upset and crying in pain and said "I cannot take much more of this, I need some help"

## 2019-02-10 NOTE — ED Notes (Signed)
No response x2 ?

## 2019-02-11 ENCOUNTER — Emergency Department (HOSPITAL_COMMUNITY)
Admission: EM | Admit: 2019-02-11 | Discharge: 2019-02-12 | Disposition: A | Discharge status: Home / Self Care | Payer: BC Managed Care – PPO | Attending: Emergency Medicine | Admitting: Emergency Medicine

## 2019-02-11 ENCOUNTER — Telehealth: Payer: Self-pay

## 2019-02-11 ENCOUNTER — Encounter (HOSPITAL_COMMUNITY): Payer: Self-pay | Admitting: Emergency Medicine

## 2019-02-11 ENCOUNTER — Other Ambulatory Visit: Payer: Self-pay

## 2019-02-11 ENCOUNTER — Ambulatory Visit: Payer: Self-pay

## 2019-02-11 DIAGNOSIS — Z7984 Long term (current) use of oral hypoglycemic drugs: Secondary | ICD-10-CM | POA: Diagnosis not present

## 2019-02-11 DIAGNOSIS — F17210 Nicotine dependence, cigarettes, uncomplicated: Secondary | ICD-10-CM | POA: Insufficient documentation

## 2019-02-11 DIAGNOSIS — K649 Unspecified hemorrhoids: Secondary | ICD-10-CM | POA: Insufficient documentation

## 2019-02-11 DIAGNOSIS — E119 Type 2 diabetes mellitus without complications: Secondary | ICD-10-CM | POA: Diagnosis not present

## 2019-02-11 DIAGNOSIS — Z79899 Other long term (current) drug therapy: Secondary | ICD-10-CM | POA: Insufficient documentation

## 2019-02-11 DIAGNOSIS — K5900 Constipation, unspecified: Secondary | ICD-10-CM | POA: Insufficient documentation

## 2019-02-11 DIAGNOSIS — I10 Essential (primary) hypertension: Secondary | ICD-10-CM | POA: Insufficient documentation

## 2019-02-11 DIAGNOSIS — K6289 Other specified diseases of anus and rectum: Secondary | ICD-10-CM | POA: Insufficient documentation

## 2019-02-11 LAB — URINALYSIS, ROUTINE W REFLEX MICROSCOPIC
Bilirubin Urine: NEGATIVE
Glucose, UA: NEGATIVE mg/dL
Hgb urine dipstick: NEGATIVE
Ketones, ur: 5 mg/dL — AB
Leukocytes,Ua: NEGATIVE
Nitrite: NEGATIVE
Protein, ur: 30 mg/dL — AB
Specific Gravity, Urine: 1.023 (ref 1.005–1.030)
pH: 5 (ref 5.0–8.0)

## 2019-02-11 LAB — CBC
HCT: 48.5 % — ABNORMAL HIGH (ref 36.0–46.0)
Hemoglobin: 15.1 g/dL — ABNORMAL HIGH (ref 12.0–15.0)
MCH: 25.5 pg — ABNORMAL LOW (ref 26.0–34.0)
MCHC: 31.1 g/dL (ref 30.0–36.0)
MCV: 82.1 fL (ref 80.0–100.0)
Platelets: 209 10*3/uL (ref 150–400)
RBC: 5.91 MIL/uL — ABNORMAL HIGH (ref 3.87–5.11)
RDW: 17.6 % — ABNORMAL HIGH (ref 11.5–15.5)
WBC: 5.2 10*3/uL (ref 4.0–10.5)
nRBC: 0 % (ref 0.0–0.2)

## 2019-02-11 LAB — LIPASE, BLOOD: Lipase: 22 U/L (ref 11–51)

## 2019-02-11 LAB — COMPREHENSIVE METABOLIC PANEL
ALT: 19 U/L (ref 0–44)
AST: 18 U/L (ref 15–41)
Albumin: 4.3 g/dL (ref 3.5–5.0)
Alkaline Phosphatase: 56 U/L (ref 38–126)
Anion gap: 10 (ref 5–15)
BUN: 8 mg/dL (ref 6–20)
CO2: 23 mmol/L (ref 22–32)
Calcium: 10 mg/dL (ref 8.9–10.3)
Chloride: 105 mmol/L (ref 98–111)
Creatinine, Ser: 0.77 mg/dL (ref 0.44–1.00)
GFR calc Af Amer: >60 mL/min (ref >60)
GFR calc non Af Amer: >60 mL/min (ref >60)
Glucose, Bld: 91 mg/dL (ref 70–99)
Potassium: 3.3 mmol/L — ABNORMAL LOW (ref 3.5–5.1)
Sodium: 138 mmol/L (ref 135–145)
Total Bilirubin: 0.4 mg/dL (ref 0.3–1.2)
Total Protein: 7 g/dL (ref 6.5–8.1)

## 2019-02-11 MED ORDER — SODIUM CHLORIDE 0.9% FLUSH: 3.0000 mL | Freq: Once | INTRAVENOUS | Status: DC

## 2019-02-11 NOTE — Telephone Encounter (Signed)
Called and spoke with pt. We went over the information below. Pt is requesting to be seen by GI asap, she is still unable to use the bathroom.  Advised I would send a message over to GI for them to get her scheduled.  Please help to get pt scheduled asap, thank you!

## 2019-02-11 NOTE — Telephone Encounter (Signed)
Copied from [place] [id number]. Topic: General - Other >> Feb 11, 2019  9:50 AM Burchel, Abbi R wrote: Pt requesting call back re: ED visit f/up questions, specifically a shot she might be given for anal fissure. Please call pt: [phone]

## 2019-02-11 NOTE — ED Triage Notes (Addendum)
Patient complaining of pain with fissures, abdominal pain, and can not have a bowel movement. Patient last bowel movement was Monday Nov. 9,2020. Patient states that she has gas but can not poop.

## 2019-02-11 NOTE — Telephone Encounter (Signed)
I called the patient, the abd/pelvic CT done in the ER did not show any evidence of proctitis or rectal abscess. Stool burden noted. No BM since CT but she is passing gas per the rectum. I advised her to take Magnesium Citrate 10 ounces then drink at least 12 ounces of Gatorade. Apply a small amount of Desitin and mix in the lidocaine ointment and push at least 1 inch inside the anal opening tid.  I will send a msg to Dr. Silverio Decamp to see if she consider doing a flex sig with sedation asap.   Lesly Rubenstein, I will contact you once I communicate with Dr .Silverio Decamp. Thx

## 2019-02-11 NOTE — Telephone Encounter (Signed)
Patient has been scheduled (tentatively) for f/u OV on 02/16/2019 as this is appt date/time are available; RN has not called to speak with patient concerning this appt as RN is awaiting next step in patient's plan of care from PA- Please advise of next step - please review ED visit and CT results from 02/10/2019

## 2019-02-11 NOTE — Telephone Encounter (Signed)
Unfortunately there is no shot for a anal fissure. I have been monitoring her journey with this problem. Please continue given medicines and it will heal over time. Keep scheduled appointments with specialists.

## 2019-02-11 NOTE — Telephone Encounter (Signed)
Returned call to patient who has severe rectal pain. She has had a CT and it has reveiled stool burden. Patient states she has a fissure.  Per note from [place] at the office patient has completed all instructions.  She states that the lidocaine causes burning and she can't stand the pain. She has been 2 day without BM. She states that she has taken the magnesium citrate per instruction from office today but is afraid to have a BM.  She is request that office expedite her referral to specialist.  Per protocol patient should be seen within 4 hours.  She will go to ER for symptom relief. Office notified of patient request for quick referral. Care advice read to patient. She verbalized understanding.  Reason for Disposition . SEVERE rectal pain (e.g., excruciating, unable to have a bowel movement)  Answer Assessment - Initial Assessment Questions 1. SYMPTOM:  "What's the main symptom you're concerned about?" (e.g., pain, itching, swelling, rash)     Pain burning 2. ONSET: "When did the burning   start?"     3 wweks 3. RECTAL PAIN: "Do you have any pain around your rectum?" "How bad is the pain?"  (Scale 1-10; or mild, moderate, severe)  - MILD (1-3): doesn't interfere with normal activities   - MODERATE (4-7): interferes with normal activities or awakens from sleep, limping   - SEVERE (8-10): excruciating pain, unable to have a bowel movement     Severe burning 4. RECTAL ITCHING: "Do you have any itching in this area?" "How bad is the itching?"  (Scale 1-10; or mild, moderate, severe)  - MILD - doesn't interfere with normal activities   - MODERATE-SEVERE: interferes with normal activities or awakens from sleep     Yes but mainly burns 5. CONSTIPATION: "Do you have constipation?" If so, "How bad is it?"     No stool in 2 days 6. CAUSE: "What do you think is causing the anus symptoms?"     fisher 7. OTHER SYMPTOMS: "Do you have any other symptoms?"  (e.g., rectal bleeding, abdominal pain, vomiting,  fever)     no 8. PREGNANCY: "Is there any chance you are pregnant?" "When was your last menstrual period?"    No  Protocols used: RECTAL [facility]

## 2019-02-11 NOTE — Telephone Encounter (Signed)
Bri, below are the notes from the patient's ED notes that were sent back to [place] to get her scheduled ASAP with her GI doctor but there is no appointments for the doctors or PA's until 11/24. Please advise. Thank you.

## 2019-02-12 ENCOUNTER — Telehealth: Payer: Self-pay | Admitting: Nurse Practitioner

## 2019-02-12 DIAGNOSIS — E139 Other specified diabetes mellitus without complications: Secondary | ICD-10-CM | POA: Diagnosis not present

## 2019-02-12 DIAGNOSIS — M21962 Unspecified acquired deformity of left lower leg: Secondary | ICD-10-CM | POA: Diagnosis not present

## 2019-02-12 DIAGNOSIS — M21961 Unspecified acquired deformity of right lower leg: Secondary | ICD-10-CM | POA: Diagnosis not present

## 2019-02-12 DIAGNOSIS — K5900 Constipation, unspecified: Secondary | ICD-10-CM | POA: Diagnosis not present

## 2019-02-12 DIAGNOSIS — M205X2 Other deformities of toe(s) (acquired), left foot: Secondary | ICD-10-CM | POA: Diagnosis not present

## 2019-02-12 DIAGNOSIS — M549 Dorsalgia, unspecified: Secondary | ICD-10-CM | POA: Diagnosis not present

## 2019-02-12 DIAGNOSIS — K602 Anal fissure, unspecified: Secondary | ICD-10-CM | POA: Diagnosis not present

## 2019-02-12 DIAGNOSIS — R39198 Other difficulties with micturition: Secondary | ICD-10-CM | POA: Diagnosis not present

## 2019-02-12 MED ORDER — MORPHINE SULFATE (PF) 4 MG/ML IV SOLN
4.0000 mg | Freq: Once | INTRAVENOUS | Status: AC
  Administered 2019-02-12: 02:00:00 4 mg via INTRAVENOUS
  Filled 2019-02-12: qty 1

## 2019-02-12 NOTE — ED Provider Notes (Signed)
[facility] DEPT Provider Note   CSN: [id number] Arrival date & time: 02/11/19  1949     History   Chief Complaint Chief Complaint  Patient presents with  . Constipation  . Abdominal Pain    HPI Alisha Espinoza is a 53 y.o. female.     Patient to ED with rectal pain and burning. She denies rectal bleeding. History of the same. She reports having surgical appointment on Monday for evaluation of possible hemorrhoids. No fever. She reports constipation because she avoids passing stool due to pain. No vomiting. She states she is using Miralax once daily, OTC stool softener and has been given topical cream but that this causes too much pain to use regularly.   The history is provided by the patient. No language interpreter was used.  Constipation Associated symptoms: abdominal pain   Associated symptoms: no back pain and no fever   Abdominal Pain Associated symptoms: constipation   Associated symptoms: no chills and no fever     Past Medical History:  Diagnosis Date  . Bronchitis   . Diabetes mellitus ([place]) 01/19/2019  . Dyspnea   . Heart murmur    resolved "closed by age 57."   . Hyperlipidemia   . Seasonal allergies     Patient Active Problem List   Diagnosis Date Noted  . Hemorrhoids 01/21/2019  . Essential hypertension 01/21/2019  . Serum calcium elevated 01/21/2019  . Elevated cholesterol 01/19/2019  . Diabetes mellitus ([place]) 01/19/2019  . 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 Acute bronchitis 08/08/2015  . Leukocytosis 08/08/2015  . Hyperglycemia 08/08/2015  . Tobacco abuse 08/08/2015  . 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 BACTERIAL PNEUMONIA, RIGHT LOWER LOBE 04/27/2008  . LUMP OR MASS IN BREAST 04/27/2008  . COUGH 04/27/2008    Past Surgical History:  Procedure Laterality Date  . ABDOMINAL HYSTERECTOMY  2000  . ANTERIOR CERVICAL DECOMP/DISCECTOMY FUSION  03/19/2012   Procedure: ANTERIOR CERVICAL  DECOMPRESSION/DISCECTOMY FUSION 1 LEVEL;  Surgeon: Melina Schools, MD;  Location: [facility];  Service: Orthopedics;  Laterality: Left;  Total Disc Replacement C4-5  . ANTERIOR CERVICAL DECOMP/DISCECTOMY FUSION N/A 05/07/2012   Procedure: ANTERIOR CERVICAL DECOMPRESSION/DISCECTOMY FUSION 1 LEVEL/HARDWARE REMOVAL;  Surgeon: Melina Schools, MD;  Location: [facility];  Service: Orthopedics;  Laterality: N/A;  REMOVAL OF CERVICAL DISC REPLACEMENT AND ACDF C4-5  . BACK SURGERY    . CARPAL TUNNEL RELEASE Right 09/28/2018   Procedure: CARPAL TUNNEL RELEASE;  Surgeon: Eustace Moore, MD;  Location: [facility];  Service: Neurosurgery;  Laterality: Right;  CARPAL TUNNEL RELEASE  . CERVICAL FUSION  05/07/2012   Dr Rolena Infante  . COLONOSCOPY    . CYSTECTOMY     L wrist  . POLYPECTOMY    . ROTATOR CUFF REPAIR     Right  . TYMPANOSTOMY TUBE PLACEMENT       OB History   No obstetric history on file.      Home Medications    Prior to Admission medications   Medication Sig Start Date End Date Taking? Authorizing Provider  albuterol (PROVENTIL HFA;VENTOLIN HFA) 108 (90 Base) MCG/ACT inhaler Inhale 1-2 puffs into the lungs every 4 (four) hours as needed for wheezing or shortness of breath (or coughing). 3/97/67  Yes Delora Fuel, MD  amoxicillin-clavulanate (AUGMENTIN) 875-125 MG tablet Take 1 tablet by mouth 2 (two) times daily. 02/09/19  Yes Noralyn Pick, NP  docusate sodium (COLACE) 100 MG capsule Take 1 capsule (100 mg total) by mouth  daily as needed. Patient taking differently: Take 100 mg by mouth daily as needed for mild constipation.  02/10/19 02/10/20 Yes Fransico Meadow, PA-C  ibuprofen (ADVIL) 800 MG tablet Take 1 tablet (800 mg total) by mouth every 8 (eight) hours as needed. Patient taking differently: Take 800 mg by mouth every 8 (eight) hours as needed for moderate pain.  02/10/19  Yes Caryl Ada K, PA-C  lidocaine (XYLOCAINE) 5 % ointment Apply 1 application topically 3 (three) times daily as  needed. Patient taking differently: Apply 1 application topically 3 (three) times daily as needed for mild pain.  02/10/19  Yes Caryl Ada K, PA-C  metFORMIN (GLUCOPHAGE) 500 MG tablet Take 1 tablet (500 mg total) by mouth 2 (two) times daily with a meal. 01/21/19  Yes Libby Maw, MD  naproxen sodium (ANAPROX) 550 MG tablet Take 1 tablet by mouth 2 (two) times daily. 01/18/19  Yes [provider]  oxyCODONE (OXY IR/ROXICODONE) 5 MG immediate release tablet Take 1 tablet by mouth every 6 (six) hours as needed for moderate pain.  12/03/18  Yes [provider]  simvastatin (ZOCOR) 20 MG tablet Take 20 mg by mouth daily. 01/26/18  Yes [provider]  tiZANidine (ZANAFLEX) 4 MG tablet Take 1 tablet by mouth every 8 (eight) hours as needed for muscle spasms.  01/04/19  Yes [provider]  TRELEGY ELLIPTA 100-62.5-25 MCG/INH AEPB Inhale 1 puff into the lungs daily. 01/04/19  Yes [provider]  valsartan-hydrochlorothiazide (DIOVAN-HCT) 160-12.5 MG tablet Take 1 tablet by mouth daily. 02/08/19  Yes [provider]  Blood Glucose Monitoring Suppl (CONTOUR NEXT MONITOR) w/Device KIT 1 each by Does not apply route daily. Use to test blood sugars 1-2 times daily. 02/02/19   Libby Maw, MD  glucose blood (CONTOUR NEXT TEST) test strip Use to test 1-2 times daily. 02/02/19   Libby Maw, MD  Microlet Lancets MISC Use to test blood sugars 1-2 times daily. 02/02/19   Libby Maw, MD    Family History Family History  Problem Relation Age of Onset  . Diabetes Mother   . Hypertension Father   . Pancreatic cancer Sister   . Colon cancer Neg Hx   . Rectal cancer Neg Hx   . Stomach cancer Neg Hx   . Colon polyps Neg Hx   . Esophageal cancer Neg Hx     Social History Social History   Tobacco Use  . Smoking status: Current Every Day Smoker    Packs/day: 0.50    Years: 30.00    Pack years: 15.00    Types:  Cigarettes  . Smokeless tobacco: Never Used  Substance Use Topics  . Alcohol use: Yes    Comment: 1-2 beers on occsion.   . Drug use: No     Allergies   Meloxicam and Mobic [meloxicam]   Review of Systems Review of Systems  Constitutional: Negative for chills and fever.  Cardiovascular: Negative.   Gastrointestinal: Positive for abdominal pain, constipation and rectal pain. Negative for anal bleeding.  Musculoskeletal: Negative.  Negative for back pain.     Physical Exam Updated Vital Signs BP 127/84   Pulse 63   Temp 98.5 F (36.9 C) (Oral)   Resp 16   Ht '5\' 6"'  (1.676 m)   Wt 87.5 kg   SpO2 96%   BMI 31.15 kg/m   Physical Exam Vitals signs and nursing note reviewed.  Constitutional:      Appearance: She is  well-developed.     Comments: Patient uncomfortable appearing.   Neck:     Musculoskeletal: Normal range of motion.  Pulmonary:     Effort: Pulmonary effort is normal.  Abdominal:     Palpations: Abdomen is soft.  Genitourinary:    Rectum: Tenderness present.     Comments: Nonthrombosed external hemorroids presents. No significant inflammation. Patient does not tolerate digital exam. There is no evidence of abscess formation. No bleeding visualized.  Skin:    General: Skin is warm and dry.  Neurological:     Mental Status: She is alert and oriented to person, place, and time.      ED Treatments / Results  Labs (all labs ordered are listed, but only abnormal results are displayed) Labs Reviewed  COMPREHENSIVE METABOLIC PANEL - Abnormal; Notable for the following components:      Result Value   Potassium 3.3 (*)    All other components within normal limits  CBC - Abnormal; Notable for the following components:   RBC 5.91 (*)    Hemoglobin 15.1 (*)    HCT 48.5 (*)    MCH 25.5 (*)    RDW 17.6 (*)    All other components within normal limits  URINALYSIS, ROUTINE W REFLEX MICROSCOPIC - Abnormal; Notable for the following components:   Ketones, ur  5 (*)    Protein, ur 30 (*)    Bacteria, UA RARE (*)    All other components within normal limits  LIPASE, BLOOD    EKG None  Radiology Ct Abdomen Pelvis W Contrast  Result Date: 02/10/2019 CLINICAL DATA:  Rectal pain. Concern for fissure with possible abscess. EXAM: CT ABDOMEN AND PELVIS WITH CONTRAST TECHNIQUE: Multidetector CT imaging of the abdomen and pelvis was performed using the standard protocol following bolus administration of intravenous contrast. CONTRAST:  118m OMNIPAQUE IOHEXOL 300 MG/ML  SOLN COMPARISON:  Abdominopelvic CT 01/19/2019 FINDINGS: Lower chest: Similar linear opacity in the right middle lobe likely scarring. No acute airspace disease. No pleural fluid. Hepatobiliary: No focal liver abnormality is seen. No gallstones, gallbladder wall thickening, or biliary dilatation. Pancreas: No ductal dilatation or inflammation. Spleen: Normal in size without focal abnormality. Adrenals/Urinary Tract: Unchanged bilateral low-density adrenal nodules consistent with adenoma. This measures 1.7 cm on the right and 1.6 cm on the left (unchanged allowing for differences in caliper placement). No hydronephrosis or perinephric edema. Homogeneous renal enhancement with symmetric excretion on delayed phase imaging. Urinary bladder is physiologically distended without wall thickening. Stomach/Bowel: Stomach is decompressed. Normal positioning of the ligament of Treitz. No small bowel wall thickening, obstruction, or inflammation. Normal appendix. Moderate stool burden throughout the colon. No colonic wall thickening. Diminished rectal wall thickening and mucosal enhancement from prior exam. No perirectal fluid collection. Vascular/Lymphatic: Moderate aortic atherosclerosis. No aneurysm. No enlarged lymph nodes in the abdomen or pelvis. Reproductive: Uterus surgically absent. Ovaries symmetric and quiescent. No suspicious adnexal mass. Other: No free air, free fluid, or intra-abdominal fluid  collection. Musculoskeletal: Posterior L3-L5 fusion with interbody spacers. Again seen sclerosis involving the vertebral endplates at these levels. No acute osseous abnormalities. IMPRESSION: 1. Diminished rectal wall thickening and mucosal enhancement from CT 3 weeks prior. No significant residual inflammation on CT. No perirectal abscess. 2. No acute findings or other change from prior exam. Aortic Atherosclerosis (ICD10-I70.0). Electronically Signed   By: MKeith RakeM.D.   On: 02/10/2019 21:29    Procedures Procedures (including critical care time)  Medications Ordered in ED Medications  sodium chloride flush (NS)  0.9 % injection 3 mL (has no administration in time range)  morphine 4 MG/ML injection 4 mg (4 mg Intravenous Given 02/12/19 0142)     Initial Impression / Assessment and Plan / ED Course  I have reviewed the triage vital signs and the nursing notes.  Pertinent labs & imaging results that were available during my care of the patient were reviewed by me and considered in my medical decision making (see chart for details).        Patient to ED with rectal pain. She has been seen for this multiple times in the emergency setting (10/20, 10/25, 11/1, 11/6, 11/8, 11/11 and today) and with PCP 10/26, and with GI 10/28, 11/10. She has had multiple CT scans to r/o abscess and infection (10/20, 11/11). She has scheduled follow up with surgery on 11/17.  She returns to the ED tonight with uncontrolled pain and constipation due to rectal pain.  There is no thrombosis of hemorrhoids. No current bleeding. She has had 2 CT scans in evaluation of these symptoms that have been negative. VSS, no fever.   IV pain medication provided with relief of pain. No further emergent treatment felt indicated at this time. She is scheduled to see surgery on Monday for continued treatment.    Final Clinical Impressions(s) / ED Diagnoses   Final diagnoses:  None   1. Rectal pain   ED  Discharge Orders    None       Charlann Lange, Hershal Coria 02/14/19 1550    Mesner, Corene Cornea, MD 02/18/19 2304

## 2019-02-12 NOTE — Telephone Encounter (Signed)
Pt went to ED

## 2019-02-12 NOTE — Telephone Encounter (Signed)
I called the patient and provided her with [name] input. Flexi sig not recommended.  Pt to proceed with colorectal surgery evaluation. She will keep her appointment with Dr. Chad Cordial Surgery on Monday 02/15/2019.

## 2019-02-12 NOTE — Discharge Instructions (Addendum)
Increase Miralax to 3 times daily to treat current and avoid future constipation.  Use stool softener as directed (Colace). Adding fiber to your diet (leafy green vegetables, supplements like Metamucil and/or benefiber) will help soften and move stool as well. Drink lots of water.   Continue your other medications including topical creams for maximum relief.   You are doing all the right things. Avoiding having a bowel movement will make your abdominal pain worse. Try to alleviate constipation with the above recommendations.   Keep your scheduled appointment with surgery for Monday.

## 2019-02-12 NOTE — Telephone Encounter (Signed)
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  Please advise her to continue treatment for anal fissure, start Benefiber 1 tablespoon 3 times daily and may also benefit from bowel purge with MiraLAX.  Repeat flexible sigmoidoscopy is likely going to be low yield.  Proctitis on the CT was likely over read due to under distention of rectum.  She does not have any rectal bleeding based on recent ER visit note. Follow-up with colorectal surgery, may consider intrarenal Botox injection. Thanks VN ----- Message ----- From: Noralyn Pick, NP Sent: 02/11/2019   1:37 PM EST To: Mauri Pole, MD Subject: Rectal pain, numerous ER visitis               Dr. Silverio Decamp,  pls see office consult this week. This pt has been to the ER  4 times. Still has severe pain.  Initial CT showed ? Proctitis. Tx'd for presumed anal fissure. She c/o severe rectal pain. I advised anoscopy or flex sig with sedation. I actually sent her to colorectal surg day I saw her and they tx'd for fissure with follow up with Dr. Johney Maine next week. Pls review since you did her recent colonoscopy 08/2018 which was normal. Will you do a flex sig on her with sedation?  Thank you for your input! [employer]

## 2019-02-13 ENCOUNTER — Encounter (HOSPITAL_COMMUNITY): Payer: Self-pay

## 2019-02-13 ENCOUNTER — Ambulatory Visit (HOSPITAL_COMMUNITY)
Admission: EM | Admit: 2019-02-13 | Discharge: 2019-02-13 | Disposition: A | Discharge status: Home / Self Care | Payer: BC Managed Care – PPO | Attending: Radiology | Admitting: Radiology

## 2019-02-13 ENCOUNTER — Other Ambulatory Visit: Payer: Self-pay

## 2019-02-13 DIAGNOSIS — K6289 Other specified diseases of anus and rectum: Secondary | ICD-10-CM

## 2019-02-13 MED ORDER — LIDOCAINE VISCOUS HCL 2 % MT SOLN
OROMUCOSAL | Status: AC
  Filled 2019-02-13: qty 15

## 2019-02-13 MED ORDER — LIDOCAINE VISCOUS HCL 2 % MT SOLN
15.0000 mL | Freq: Once | OROMUCOSAL | Status: AC
  Administered 2019-02-13: 15 mL via OROMUCOSAL

## 2019-02-13 NOTE — ED Provider Notes (Addendum)
[facility]    CSN: [id number] Arrival date & time: 02/13/19  1258      History   Chief Complaint Chief Complaint  Patient presents with  . rectal pain    HPI Alisha Espinoza is a 53 y.o. female. presents with cosntipation. Patient states that she has a known anal fissure that she is following up with GI on 11.16.20 but states the feels like she has to use to have a bowel movement and cannot Condition is chronic in nature. Condition is made better by nothing. Condition is made worse by nothing. Patient denies any relief from stool softeners, fiber pills and diet changed. prior to there arrival at this facility. Patient has been seen several different times recently for similar complains. Patient is requesting that a procedure that was performed approximately 1 week ago to be performed again.  CT scan from 11.11.20 shows moderate stool burden. Patient states she is passing gas and small amounts of stool  Physical exam performed with the assistance of chaperone 3-4 healing vesical presents no identifiable fissue. Patein given lidocaine jelly for pain control and instructed to follow up with GI on Monday as schedule. Patient is in agreement with plan orf care    HPI  Past Medical History:  Diagnosis Date  . Bronchitis   . Diabetes mellitus ([place]) 01/19/2019  . Dyspnea   . Heart murmur    resolved "closed by age 28."   . Hyperlipidemia   . Seasonal allergies     Patient Active Problem List   Diagnosis Date Noted  . Hemorrhoids 01/21/2019  . Essential hypertension 01/21/2019  . Serum calcium elevated 01/21/2019  . Elevated cholesterol 01/19/2019  . Diabetes mellitus ([place]) 01/19/2019  . 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 Acute bronchitis 08/08/2015  . Leukocytosis 08/08/2015  . Hyperglycemia 08/08/2015  . Tobacco abuse 08/08/2015  . 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 BACTERIAL PNEUMONIA, RIGHT LOWER LOBE 04/27/2008  . LUMP OR MASS IN BREAST  04/27/2008  . COUGH 04/27/2008    Past Surgical History:  Procedure Laterality Date  . ABDOMINAL HYSTERECTOMY  2000  . ANTERIOR CERVICAL DECOMP/DISCECTOMY FUSION  03/19/2012   Procedure: ANTERIOR CERVICAL DECOMPRESSION/DISCECTOMY FUSION 1 LEVEL;  Surgeon: Melina Schools, MD;  Location: [facility];  Service: Orthopedics;  Laterality: Left;  Total Disc Replacement C4-5  . ANTERIOR CERVICAL DECOMP/DISCECTOMY FUSION N/A 05/07/2012   Procedure: ANTERIOR CERVICAL DECOMPRESSION/DISCECTOMY FUSION 1 LEVEL/HARDWARE REMOVAL;  Surgeon: Melina Schools, MD;  Location: [facility];  Service: Orthopedics;  Laterality: N/A;  REMOVAL OF CERVICAL DISC REPLACEMENT AND ACDF C4-5  . BACK SURGERY    . CARPAL TUNNEL RELEASE Right 09/28/2018   Procedure: CARPAL TUNNEL RELEASE;  Surgeon: Eustace Moore, MD;  Location: [facility];  Service: Neurosurgery;  Laterality: Right;  CARPAL TUNNEL RELEASE  . CERVICAL FUSION  05/07/2012   Dr Rolena Infante  . COLONOSCOPY    . CYSTECTOMY     L wrist  . POLYPECTOMY    . ROTATOR CUFF REPAIR     Right  . TYMPANOSTOMY TUBE PLACEMENT      OB History   No obstetric history on file.      Home Medications    Prior to Admission medications   Medication Sig Start Date End Date Taking? Authorizing Provider  albuterol (PROVENTIL HFA;VENTOLIN HFA) 108 (90 Base) MCG/ACT inhaler Inhale 1-2 puffs into the lungs every 4 (four) hours as needed for wheezing or shortness of breath (or coughing). 1/49/70   Delora Fuel,  MD  amoxicillin-clavulanate (AUGMENTIN) 875-125 MG tablet Take 1 tablet by mouth 2 (two) times daily. 02/09/19   Noralyn Pick, NP  Blood Glucose Monitoring Suppl (CONTOUR NEXT MONITOR) w/Device KIT 1 each by Does not apply route daily. Use to test blood sugars 1-2 times daily. 02/02/19   Libby Maw, MD  docusate sodium (COLACE) 100 MG capsule Take 1 capsule (100 mg total) by mouth daily as needed. Patient taking differently: Take 100 mg by mouth daily as needed for mild  constipation.  02/10/19 02/10/20  Fransico Meadow, PA-C  glucose blood (CONTOUR NEXT TEST) test strip Use to test 1-2 times daily. 02/02/19   Libby Maw, MD  ibuprofen (ADVIL) 800 MG tablet Take 1 tablet (800 mg total) by mouth every 8 (eight) hours as needed. Patient taking differently: Take 800 mg by mouth every 8 (eight) hours as needed for moderate pain.  02/10/19   Fransico Meadow, PA-C  lidocaine (XYLOCAINE) 5 % ointment Apply 1 application topically 3 (three) times daily as needed. Patient taking differently: Apply 1 application topically 3 (three) times daily as needed for mild pain.  02/10/19   Fransico Meadow, PA-C  metFORMIN (GLUCOPHAGE) 500 MG tablet Take 1 tablet (500 mg total) by mouth 2 (two) times daily with a meal. 01/21/19   Libby Maw, MD  Microlet Lancets MISC Use to test blood sugars 1-2 times daily. 02/02/19   Libby Maw, MD  naproxen sodium (ANAPROX) 550 MG tablet Take 1 tablet by mouth 2 (two) times daily. 01/18/19   [provider]  oxyCODONE (OXY IR/ROXICODONE) 5 MG immediate release tablet Take 1 tablet by mouth every 6 (six) hours as needed for moderate pain.  12/03/18   [provider]  simvastatin (ZOCOR) 20 MG tablet Take 20 mg by mouth daily. 01/26/18   [provider]  tiZANidine (ZANAFLEX) 4 MG tablet Take 1 tablet by mouth every 8 (eight) hours as needed for muscle spasms.  01/04/19   [provider]  TRELEGY ELLIPTA 100-62.5-25 MCG/INH AEPB Inhale 1 puff into the lungs daily. 01/04/19   [provider]  valsartan-hydrochlorothiazide (DIOVAN-HCT) 160-12.5 MG tablet Take 1 tablet by mouth daily. 02/08/19   [provider]    Family History Family History  Problem Relation Age of Onset  . Diabetes Mother   . Hypertension Father   . Pancreatic cancer Sister   . Colon cancer Neg Hx   . Rectal cancer Neg Hx   . Stomach cancer Neg Hx   . Colon polyps Neg Hx   . Esophageal cancer  Neg Hx     Social History Social History   Tobacco Use  . Smoking status: Current Every Day Smoker    Packs/day: 0.50    Years: 30.00    Pack years: 15.00    Types: Cigarettes  . Smokeless tobacco: Never Used  Substance Use Topics  . Alcohol use: Yes    Comment: 1-2 beers on occsion.   . Drug use: No     Allergies   Meloxicam and Mobic [meloxicam]   Review of Systems Review of Systems  Constitutional: Negative for chills and fever.  HENT: Negative for ear pain and sore throat.   Eyes: Negative for pain and visual disturbance.  Respiratory: Negative for cough and shortness of breath.   Cardiovascular: Negative for chest pain and palpitations.  Gastrointestinal: Positive for abdominal pain, constipation and rectal pain. Negative for vomiting.  Genitourinary: Negative for dysuria and hematuria.  Musculoskeletal: Negative for arthralgias and back pain.  Skin: Negative for color change and rash.  Neurological: Negative for seizures and syncope.  All other systems reviewed and are negative.    Physical Exam Triage Vital Signs ED Triage Vitals  Enc Vitals Group     BP 02/13/19 1340 (!) 130/97     Pulse Rate 02/13/19 1340 75     Resp 02/13/19 1340 16     Temp 02/13/19 1340 97.6 F (36.4 C)     Temp Source 02/13/19 1340 Tympanic     SpO2 02/13/19 1340 100 %     Weight --      Height --      Head Circumference --      Peak Flow --      Pain Score 02/13/19 1344 10     Pain Loc --      Pain Edu? --      Excl. in [place]? --    No data found.  Updated Vital Signs BP (!) 130/97 (BP Location: Right Arm)   Pulse 75   Temp 97.6 F (36.4 C) (Tympanic)   Resp 16   SpO2 100%       Physical Exam Vitals signs and nursing note reviewed.  Constitutional:      Appearance: She is well-developed.  HENT:     Head: Normocephalic and atraumatic.  Eyes:     Conjunctiva/sclera: Conjunctivae normal.  Neck:     Musculoskeletal: Normal range of motion.  Pulmonary:      Effort: Pulmonary effort is normal.  Skin:    Comments: approximately 4 small vesicles in healing state   Neurological:     Mental Status: She is alert and oriented to person, place, and time.      UC Treatments / Results  Labs (all labs ordered are listed, but only abnormal results are displayed) Labs Reviewed - No data to display  EKG   Radiology No results found.  Procedures Procedures (including critical care time)  Medications Ordered in UC Medications - No data to display  Initial Impression / Assessment and Plan / UC Course  I have reviewed the triage vital signs and the nursing notes.  Pertinent labs & imaging results that were available during my care of the patient were reviewed by me and considered in my medical decision making (see chart for details).      Final Clinical Impressions(s) / UC Diagnoses   Final diagnoses:  None   Discharge Instructions   None    ED Prescriptions    None     PDMP not reviewed this encounter.   Jacqualine Mau, NP 02/13/19 1436    Jacqualine Mau, NP 02/13/19 1437    Jacqualine Mau, NP 02/13/19 1552

## 2019-02-13 NOTE — ED Triage Notes (Signed)
Pt presents to UC w/ c/o rectal burning and fissure x3 weeks. Pt states she has been to ER and they prescribed her cream but it does not help. Pt also states it is difficult for her to pass gas and has sharp pains in upper abdomen. Pt's abdomen is slightly bloated.

## 2019-02-14 NOTE — Progress Notes (Signed)
02/14/2019 LUCEY CONSER [id number] 1965/08/27   History of Present Illness: Alisha Espinoza is a 53 year old female with a past medical history of hypertension, DM II, and colon polyps. Refer to office visit 02/07/2019. She presents today for further evaluation regarding anal burning pain.  Nitroglycerin ointment caused worse anal burning.  Diltiazem cream caused burning but not as severe as the nitroglycerin ointment.  She continues to use lidocaine ointment once or twice daily to the perianal area.  She was seen by colorectal surgeon, Dr. Leighton Ruff, [id number]. Anoscopy evaluation was limited due to stool in the rectum, an anal fissure was not identified. Hyperemic distal rectal mucosa was identified. She was prescribed Anusol 30mg  suppository PR bid x 14 days.  She reported passing a small to moderate amount of liquid stool after the endoscopy evaluation.  She is not yet had a bowel movement today.  She was taking MiraLAX daily, however, she has not taken it for a few days.  She is intermittently use Benefiber.  She denies any history of anal intercourse or sexual abuse.  Labs 02/11/2019: WBC 5.2. Hg 15.1. HCT 48.5. PLT 209.  Summary of ER visits: 1. [facility] 01/19/2019: Rectal exam not done secondary to anal pain. CT showed proctitis. Prescribed Cipro 500mg  po bid, Metronidazole 500mg  tid and Hydrocortisone cream.  2. [facility] 01/24/2019: Patient declined rectal exam secondary to pain.  Continue Cipro and Metronidazole. Advised to use Prep H with lidocaine.  [facility] 01/31/2019: Anal fissure, external hemorrhoid, white raised peri anal lesions noted. Nitrogycerin 0.4% ointment, Lidocaine-Hydrocortisone 3-0.5%. No hx of genital herpes. Not sexually active since 2016. 4. [facility] 02/05/2019: External hemorrhoid, 7 (31mm) perianal ulcerations, healing small fissure, no pain with exam. Prescribed Valtrex 1000mg  po  Bid x 10 days 5. [facility] 02/07/2019:  Prescribed Lidocaine and instructed to use enema. 6. [facility] 02/09/2009: Repeat abd/pelvic CT showed stool burden, no proctitis or anorectal abcess 7. [facility] 02/11/2019: Follow up with colorectal surgery Mon. 02/15/2019.    [facility] consult:  01/27/2019 diagnosed with probable anal fissure. Prescribed Diltiazem fissure cream, Miralax purge.  02/09/2019: no obvious fissure, limited exam due to severe rectal pain, patient was  sent directly to [facility].  [facility] consult:  02/09/2019: evaluated by rectal surgery PA-C, continue fissure tx,  Miralax, Benefiber. Follow up with Dr. Johney Maine in 1 week. 02/15/2019: evaluation by Dr. Johney Maine  Gyn consult 02/08/2019: culture of perianal lesions done, patient reported results were negative for herpes  Abdominal/pelvic CT with contrast 02/10/2019: 1. Diminished rectal wall thickening and mucosal enhancement from CT 3 weeks prior. No significant residual inflammation on CT. No perirectal abscess. 2. No acute findings or other change from prior exam  Abdominal/pelvic CT 01/19/2019: Mild circumferential thickening at the level of the rectum with mucosal hyperemia, suggestive of proctitis. No CT evidence of perirectal abscess. 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 Prior L3-L5 posterior spinal decompression and fusion with sclerotic endplate changes about the interbody spacers.  Colonoscopy 09/18/2018 by Dr. Silverio Decamp: - One 2 mm tubular adenomatous polyp in the ascending colon removed. - One 3 mm hyperplastic polyp in the rectum, removed with a cold snare. - Mild diverticulosis in the sigmoid colon and in the descending colon. Peri-diverticular erythema was seen. - Non-bleeding internal hemorrhoids. -Repeat colonoscopy in 5 years.  Colonoscopy 08/27/2017: - Four 8 to 18 mm polyps in the rectum, at the recto-sigmoid  colon, in the sigmoid colon and in the transverse colon, removed with a hot  snare. Resected and retrieved. - Two 4 to 7 mm polyps in the sigmoid colon and in the ascending colon, removed with a cold snare. Resected and retrieved. - Non-bleeding internal hemorrhoids. - The examination was otherwise normal. -Repeat colonoscopy in 1 year.   Current Medications, Allergies, Past Medical History, Past Surgical History, Family History and Social History were reviewed in [employer] record.   Physical Exam: There were no vitals taken for this visit. General: Well developed 53 year old female  Head: Normocephalic and atraumatic Eyes:  sclerae anicteric, conjunctiva pink  Ears: Normal auditory acuity Lungs: Clear throughout to auscultation Heart: Regular rate and rhythm Abdomen: Soft, non tender and non distended. No masses, no hepatomegaly. Normal bowel sounds Rectal: Small circular lesions with white border to left anal area, not herpetic, most likely follicular/blocked follicle duct per Dr. Silverio Decamp. Recticare ointment applied inside anal opening and anoscope advanced by Dr. Silverio Decamp, then removed with liquid brown stool obscuring view. No definitive fissure assessed.  Musculoskeletal: Symmetrical with no gross deformities  Extremities: No edema  Neurological: Alert oriented x 4, grossly nonfocal Psychological:  Alert and cooperative. Normal mood and affect  Assessment and Recommendations:  27. 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  Anal fissure has not been adequately identified on rectal exam/anoscopy evaluation. -Suprep or Plenvu bowel purge tonight  -Ok to use Hydrocortisone suppository 30mg  one PR once or twice daily as prescribed by Dr. Marcello Moores -Tomorrow patient to okay thank you start Benefiber 1 tablespoon tid, Diltiazem with lidocaine 2% ointment apply a small amount inside the anal opening tid x 6 weeks -Follow up with Dr. Silverio Decamp in 3 to 4 weeks, will most likely  have a repeat anoscopy exam at that time  2. Constipation -See plan in # 1  3. History of chronic lower back pain, s/p lumbar fusion on chronic narcotics

## 2019-02-15 DIAGNOSIS — K6289 Other specified diseases of anus and rectum: Secondary | ICD-10-CM | POA: Diagnosis not present

## 2019-02-16 ENCOUNTER — Encounter: Payer: Self-pay | Admitting: Nurse Practitioner

## 2019-02-16 ENCOUNTER — Ambulatory Visit (INDEPENDENT_AMBULATORY_CARE_PROVIDER_SITE_OTHER): Payer: BC Managed Care – PPO | Admitting: Nurse Practitioner

## 2019-02-16 DIAGNOSIS — K6289 Other specified diseases of anus and rectum: Secondary | ICD-10-CM | POA: Diagnosis not present

## 2019-02-16 NOTE — Patient Instructions (Addendum)
If you are age 53 or older, your body mass index should be between 23-30. Your Body mass index is 30.67 kg/m. If this is out of the aforementioned range listed, please consider follow up with your Primary Care Provider.  If you are age 3 or younger, your body mass index should be between 19-25. Your Body mass index is 30.67 kg/m. If this is out of the aformentioned range listed, please consider follow up with your Primary Care Provider.   Please do a Suprep bowel purge. Drink 1 bottle of Suprep followed by a liquid diet.   Use the Diltiazem gel 2% gel and lidocaine three times a day.  Benefiber 1 tablespoon three times a day.   Miralax 1 capful a day.  Continue the Anusol suppositories  1-2 times a day.   It was a pleasure to see you today!

## 2019-02-16 NOTE — Telephone Encounter (Signed)
Call was placed to [facility] to get a copy of her office vist. She cancelled  her appt for 02-15-2019 and is now scheduled for 03-01-2019 with Dr A. Thomas at the [place] location.

## 2019-02-17 ENCOUNTER — Telehealth: Payer: Self-pay | Admitting: Nurse Practitioner

## 2019-02-17 DIAGNOSIS — K602 Anal fissure, unspecified: Secondary | ICD-10-CM

## 2019-02-17 DIAGNOSIS — K6289 Other specified diseases of anus and rectum: Secondary | ICD-10-CM

## 2019-02-17 NOTE — Telephone Encounter (Signed)
Pt called wanting to know what to do.  Explained to pt that she would need a procedure, and nurse would call her in the morning to schedule.

## 2019-02-17 NOTE — Telephone Encounter (Signed)
Pt is calling back and said she is in so much pain from the hemorrhoids and wants to know what she can do.  "I can't take this much longer"

## 2019-02-17 NOTE — Telephone Encounter (Signed)
Please review and advise on next step in patient's plan of care

## 2019-02-17 NOTE — Telephone Encounter (Signed)
Pt is requesting medication for pt, she states that rectal pain is very bad today. Pls call her.

## 2019-02-17 NOTE — Telephone Encounter (Signed)
Lesly Rubenstein, I saw the patient with Dr. Silverio Decamp yesterday.  I do not have any further recommendations for pain management.  If Dr. Silverio Decamp is in the office today can you please review this message with her.

## 2019-02-17 NOTE — Telephone Encounter (Signed)
Please review previous message and advise on next step in patient's plan of care

## 2019-02-17 NOTE — Telephone Encounter (Signed)
She has anal fissure, will need to continue the treatement as discussed in office visit. Exam was limited due to large amount of stool in rectal vault. Please schedule for flexible sigmoidoscopy next available appointment. Thanks

## 2019-02-18 MED ORDER — OXYCODONE HCL 5 MG PO TABS: 5.0000 mg | ORAL_TABLET | Freq: Four times a day (QID) | ORAL | 0 refills | Status: DC | PRN

## 2019-02-18 NOTE — Telephone Encounter (Signed)
Done, thanks

## 2019-02-18 NOTE — Telephone Encounter (Signed)
Called and spoke with patient- patient given information and agreeable to plan of care; patient verified pharmacy of choice; RX sent to pharmacy per MD; Patient advised to call back to the office at [phone] should questions/concerns arise;  Patient verbalized understanding of information/instructions;

## 2019-02-18 NOTE — Telephone Encounter (Signed)
Called and spoke with patient-patient informed of MD recommendations; patient is agreeable with plan of care but wants to know if there are any pain medications for her rectal pain to "get me through until my procedure";  Patient has been scheduled for her COVID screen on 03/01/2019 at 11:20 am; patient has also been scheduled for her flex sig at [facility] on 03/03/2019 at 9:30 am; patient has been mailed instructions;  Patient verbalized understanding of information/instructions;  Patient was advised to call the office at [phone] if questions/concerns arise;

## 2019-02-18 NOTE — Telephone Encounter (Signed)
Order for Ocycodone 5 mg is pended in Epic-please sign this order-thank you

## 2019-02-18 NOTE — Telephone Encounter (Signed)
Okay please send prescription for oxycodone 5 mg every 6 hours as needed for 5 days.  Thank you

## 2019-02-19 ENCOUNTER — Telehealth: Payer: Self-pay | Admitting: Nurse Practitioner

## 2019-02-19 NOTE — Telephone Encounter (Signed)
Patient notified okay to take prednisone

## 2019-02-19 NOTE — Telephone Encounter (Signed)
Patient is advised to keep her appointment for the injection.

## 2019-02-23 ENCOUNTER — Other Ambulatory Visit: Payer: Self-pay | Admitting: Nurse Practitioner

## 2019-02-23 ENCOUNTER — Other Ambulatory Visit: Payer: Self-pay | Admitting: Gastroenterology

## 2019-02-23 DIAGNOSIS — K602 Anal fissure, unspecified: Secondary | ICD-10-CM

## 2019-02-23 DIAGNOSIS — K6289 Other specified diseases of anus and rectum: Secondary | ICD-10-CM

## 2019-02-23 MED ORDER — OXYCODONE HCL 5 MG PO TABS: 5.0000 mg | ORAL_TABLET | Freq: Four times a day (QID) | ORAL | 0 refills | Status: DC | PRN

## 2019-02-23 NOTE — Telephone Encounter (Signed)
Pt is requesting prescription for oxycodone.

## 2019-02-23 NOTE — Telephone Encounter (Signed)
I reviewed the patient's chart and I refilled the pain medication as previously ordered per [name] recommendation recently until she can get through her colonoscopy. She should continue to use diltiazem / lidocaine ointment TID.   Alisha Espinoza

## 2019-02-24 NOTE — Telephone Encounter (Signed)
Thank you Steve! 

## 2019-03-01 ENCOUNTER — Ambulatory Visit (INDEPENDENT_AMBULATORY_CARE_PROVIDER_SITE_OTHER): Payer: BC Managed Care – PPO

## 2019-03-01 ENCOUNTER — Other Ambulatory Visit: Payer: Self-pay | Admitting: Gastroenterology

## 2019-03-01 DIAGNOSIS — Z1159 Encounter for screening for other viral diseases: Secondary | ICD-10-CM

## 2019-03-01 MED ORDER — OXYCODONE HCL 5 MG PO TABS: 5.0000 mg | ORAL_TABLET | Freq: Four times a day (QID) | ORAL | 0 refills | Status: AC | PRN

## 2019-03-01 NOTE — Progress Notes (Signed)
Reviewed and agree with documentation and assessment and plan. K. Veena Rayvion Stumph , MD   

## 2019-03-01 NOTE — Progress Notes (Signed)
Reviewed and agree with documentation and assessment and plan. K. Veena Nandigam , MD   

## 2019-03-01 NOTE — Addendum Note (Signed)
Addended by: Faythe Casa E on: 03/01/2019 05:00 PM   Modules accepted: Orders

## 2019-03-01 NOTE — Telephone Encounter (Signed)
Okay to give her 1 more refill . Thanks

## 2019-03-01 NOTE — Telephone Encounter (Signed)
Patient walked into office today requesting a refill on the Oxy IR for the rectal pain-please advise as patient is scheduled for 03/03/2019 for her flex sig

## 2019-03-02 LAB — SARS CORONAVIRUS 2 (TAT 6-24 HRS): SARS Coronavirus 2: NEGATIVE

## 2019-03-03 ENCOUNTER — Other Ambulatory Visit: Payer: Self-pay

## 2019-03-03 ENCOUNTER — Encounter: Payer: Self-pay | Admitting: Gastroenterology

## 2019-03-03 ENCOUNTER — Ambulatory Visit (AMBULATORY_SURGERY_CENTER): Payer: Medicaid Other | Admitting: Gastroenterology

## 2019-03-03 VITALS — BP 119/88 | HR 61 | Temp 98.0°F | Resp 11 | Ht 66.0 in | Wt 190.0 lb

## 2019-03-03 DIAGNOSIS — K6289 Other specified diseases of anus and rectum: Secondary | ICD-10-CM

## 2019-03-03 MED ORDER — HYDROCORTISONE ACETATE 25 MG RE SUPP: 25.0000 mg | Freq: Every day | RECTAL | 1 refills | Status: DC

## 2019-03-03 MED ORDER — SODIUM CHLORIDE 0.9 % IV SOLN: 500.0000 mL | Freq: Once | INTRAVENOUS | Status: DC

## 2019-03-03 NOTE — Op Note (Signed)
[facility] Patient Name: Alisha Espinoza Procedure Date: 03/03/2019 9:28 AM MRN: [id number] Endoscopist: Mauri Pole , MD Age: 53 Referring MD:  Date of Birth: Nov 12, 1965 Gender: Female Account #: [id number] Procedure:                Flexible Sigmoidoscopy Indications:              Abdominal pain in the left lower quadrant, Pelvic                            pain, Rectal pain Medicines:                Monitored Anesthesia Care Procedure:                Pre-Anesthesia Assessment:                           - Prior to the procedure, a History and Physical                            was performed, and patient medications and                            allergies were reviewed. The patient's tolerance of                            previous anesthesia was also reviewed. The risks                            and benefits of the procedure and the sedation                            options and risks were discussed with the patient.                            All questions were answered, and informed consent                            was obtained. Prior Anticoagulants: The patient has                            taken no previous anticoagulant or antiplatelet                            agents. ASA Grade Assessment: II - A patient with                            mild systemic disease. After reviewing the risks                            and benefits, the patient was deemed in                            satisfactory condition to undergo the procedure.  After obtaining informed consent, the scope was                            passed under direct vision. The Colonoscope was                            introduced through the anus and advanced to the the                            left transverse colon. The flexible sigmoidoscopy                            was accomplished without difficulty. The patient                            tolerated the procedure  well. Scope In: 9:33:15 AM Scope Out: 9:39:53 AM Total Procedure Duration: 0 hours 6 minutes 38 seconds  Findings:                 The perianal examination was normal.                           Thrombosed internal hemorrhoids were found during                            retroflexion in right anterior position. The                            hemorrhoids were small.                           The exam was otherwise without abnormality. Complications:            No immediate complications. Estimated Blood Loss:     Estimated blood loss: none. Impression:               - Thrombosed internal hemorrhoids, right anterior                            position.                           - The examination was otherwise normal.                           - No specimens collected. Recommendation:           - Use Benefiber one teaspoon PO TID indefinitely.                           - Miralax 1 capful (17 grams) in 8 ounces of water                            PO BID.                           - Take Sitz baths after each bowel movement for two  weeks.                           - Use hydrocortisone suppository 25 mg 1 per rectum                            once a day for 1 week as needed.                           - Avoid narcotics to prevent constipation                           - Follow up in office visit in 6-8 weeks Mauri Pole, MD 03/03/2019 9:47:14 AM This report has been signed electronically.

## 2019-03-03 NOTE — Patient Instructions (Signed)
YOU HAD AN ENDOSCOPIC PROCEDURE TODAY AT THE [facility]:   Refer to the procedure report that was given to you for any specific questions about what was found during the examination.  If the procedure report does not answer your questions, please call your gastroenterologist to clarify.  If you requested that your care partner not be given the details of your procedure findings, then the procedure report has been included in a sealed envelope for you to review at your convenience later.  YOU SHOULD EXPECT: Some feelings of bloating in the abdomen. Passage of more gas than usual.  Walking can help get rid of the air that was put into your GI tract during the procedure and reduce the bloating. If you had a lower endoscopy (such as a colonoscopy or flexible sigmoidoscopy) you may notice spotting of blood in your stool or on the toilet paper. If you underwent a bowel prep for your procedure, you may not have a normal bowel movement for a few days.  Please Note:  You might notice some irritation and congestion in your nose or some drainage.  This is from the oxygen used during your procedure.  There is no need for concern and it should clear up in a day or so.  SYMPTOMS TO REPORT IMMEDIATELY:   Following lower endoscopy (colonoscopy or flexible sigmoidoscopy):  Excessive amounts of blood in the stool  Significant tenderness or worsening of abdominal pains  Swelling of the abdomen that is new, acute  Fever of 100F or higher  For urgent or emergent issues, a gastroenterologist can be reached at any hour by calling [phone].   DIET:  We do recommend a small meal at first, but then you may proceed to your regular diet.  Drink plenty of fluids but you should avoid alcoholic beverages for 24 hours.  ACTIVITY:  You should plan to take it easy for the rest of today and you should NOT DRIVE or use heavy machinery until tomorrow (because of the sedation medicines used during the test).     FOLLOW UP: Our staff will call the number listed on your records 48-72 hours following your procedure to check on you and address any questions or concerns that you may have regarding the information given to you following your procedure. If we do not reach you, we will leave a message.  We will attempt to reach you two times.  During this call, we will ask if you have developed any symptoms of COVID 19. If you develop any symptoms (ie: fever, flu-like symptoms, shortness of breath, cough etc.) before then, please call [phone].  If you test positive for Covid 19 in the 2 weeks post procedure, please call and report this information to us.    If any biopsies were taken you will be contacted by phone or by letter within the next 1-3 weeks.  Please call us at [phone] if you have not heard about the biopsies in 3 weeks.    SIGNATURES/CONFIDENTIALITY: You and/or your care partner have signed paperwork which will be entered into your electronic medical record.  These signatures attest to the fact that that the information above on your After Visit Summary has been reviewed and is understood.  Full responsibility of the confidentiality of this discharge information lies with you and/or your care-partner. 

## 2019-03-03 NOTE — Progress Notes (Signed)
Report to PACU, RN, vss, BBS= Clear.  

## 2019-03-05 ENCOUNTER — Telehealth: Payer: Self-pay | Admitting: *Deleted

## 2019-03-05 NOTE — Telephone Encounter (Signed)
1. Have you developed a fever since your procedure? no  2.   Have you had an respiratory symptoms (SOB or cough) since your procedure? no  3.   Have you tested positive for COVID 19 since your procedure no  4.   Have you had any family members/close contacts diagnosed with the COVID 19 since your procedure?  no   If yes to any of these questions please route to Joylene John, RN and Alphonsa Gin, Therapist, sports.  Follow up Call-  Call back number 03/03/2019 09/18/2018 08/27/2017  Post procedure Call Back phone  # [phone]  Permission to leave phone message Yes Yes Yes  Some recent data might be hidden     Patient questions:  Do you have a fever, pain , or abdominal swelling? No. Pain Score  0 *  Have you tolerated food without any problems? Yes.    Have you been able to return to your normal activities? Yes.    Do you have any questions about your discharge instructions: Diet   No. Medications  No. Follow up visit  No.  Do you have questions or concerns about your Care? No.  Actions: * If pain score is 4 or above: No action needed, pain <4.

## 2019-03-09 ENCOUNTER — Other Ambulatory Visit: Payer: Self-pay

## 2019-03-09 ENCOUNTER — Telehealth: Payer: Self-pay

## 2019-03-09 MED ORDER — LINZESS 290 MCG PO CAPS: 290.0000 ug | ORAL_CAPSULE | Freq: Every day | ORAL | 3 refills | Status: DC

## 2019-03-09 NOTE — Telephone Encounter (Signed)
Please send Rx for Linzess 261mcg daily given she is having persistent constipation.

## 2019-03-09 NOTE — Telephone Encounter (Signed)
Called the patient to schedule her follow up appointment.  She reports she is having problems "again" with constipation. Bowel movement is hard, painful and difficult to move. She states she is taking Benefiber three times daily. Miralax twice daily. She states she is still using the hydrocortisone suppositories. She then states she has to "get more Miralax and fiber today."  Without further prompting the patient states she will purge with the Miralax. Suggested she also use a glycerin suppository for comfort with defecation.

## 2019-03-09 NOTE — Telephone Encounter (Signed)
Discussed with the patient. She agrees to this plan. If she acutely worsens, fails to improve or has further issues, she will call us immediatly.

## 2019-03-10 NOTE — Telephone Encounter (Signed)
Pt called stating that her sxs have not improved. Pls call her.

## 2019-03-10 NOTE — Telephone Encounter (Signed)
States she is not passing as much stool as she feels she should. She states she is straining a lot. Reminded of the "10 minute rule" for being on the toilet. Reviewed her medications for compliance. She will continue as instructed taking her Linzess, fiber and Miralax. Able to pass some stool. No abdominal pain. No rectal blood. Eating normal diet.

## 2019-03-15 ENCOUNTER — Telehealth: Payer: Self-pay

## 2019-03-15 ENCOUNTER — Telehealth: Payer: Self-pay | Admitting: Gastroenterology

## 2019-03-15 NOTE — Telephone Encounter (Signed)
Patient has concerns about her vaginal area. She states she is red. No order. No burning or itching. She states she is red and it doesn't feel right. Encouraged to contact her GYN or PCP with her concerns.

## 2019-03-15 NOTE — Telephone Encounter (Signed)
Spoke with the patient. She states she saw "a little blood on the tissue" but states her most recent bowel movement sis not have any blood.  Denies any rectal discomforts. No change of her current routine. Maintain soft bowel movements.

## 2019-03-22 ENCOUNTER — Telehealth: Payer: Self-pay | Admitting: Gastroenterology

## 2019-03-22 NOTE — Telephone Encounter (Signed)
Pt would like to speak with you about the suppositories that she was prescribed.

## 2019-03-23 ENCOUNTER — Ambulatory Visit (INDEPENDENT_AMBULATORY_CARE_PROVIDER_SITE_OTHER): Payer: Medicaid Other | Admitting: Family Medicine

## 2019-03-23 ENCOUNTER — Other Ambulatory Visit: Payer: Self-pay | Admitting: Family Medicine

## 2019-03-23 ENCOUNTER — Encounter: Payer: Self-pay | Admitting: Family Medicine

## 2019-03-23 ENCOUNTER — Other Ambulatory Visit: Payer: Self-pay

## 2019-03-23 DIAGNOSIS — M5440 Lumbago with sciatica, unspecified side: Secondary | ICD-10-CM | POA: Diagnosis not present

## 2019-03-23 DIAGNOSIS — G8929 Other chronic pain: Secondary | ICD-10-CM

## 2019-03-23 MED ORDER — CELECOXIB 200 MG PO CAPS: 200.0000 mg | ORAL_CAPSULE | Freq: Two times a day (BID) | ORAL | 6 refills | Status: DC | PRN

## 2019-03-23 MED ORDER — VITAMIN D-3 125 MCG (5000 UT) PO TABS: 1.0000 | ORAL_TABLET | Freq: Every day | ORAL | 3 refills | Status: DC

## 2019-03-23 MED ORDER — GABAPENTIN 100 MG PO CAPS: ORAL_CAPSULE | ORAL | 3 refills | Status: DC

## 2019-03-23 MED ORDER — NABUMETONE 750 MG PO TABS: 750.0000 mg | ORAL_TABLET | Freq: Two times a day (BID) | ORAL | 6 refills | Status: DC | PRN

## 2019-03-23 NOTE — Progress Notes (Signed)
I saw and examined the patient with Dr. Mayer Masker and agree with assessment and plan as outlined.    Had to switch providers due to insurance change.    Has had lumbar microdiscectomy one year ago (presumed, based on her report) and fusion of L3-4 and L4-5 this past June.  Pain is better overall, but still having daily pain and some paresthesias in left leg.  Naproxen no longer helps.  Will try PT, gabapentin, Relafen.
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
## 2019-03-24 NOTE — Telephone Encounter (Signed)
Patient states she is "doing okay." She does "take something to make me have a bowel movement." She has used all the Rx suppositories. Presently no bleeding or rectal discomfort. She wanted to know if she should refill them or use somehting else. She has a prescription refill on the hydrocortisone suppositories. Advised if she is symptoms free, she does not have to continue using them. If she wants to use a glycerin suppository and finds that is helpful in addition to her stool softeners, that would be okay. She will call back next week if she is having any issues.

## 2019-04-07 ENCOUNTER — Ambulatory Visit: Payer: Medicaid Other | Admitting: Physical Therapy

## 2019-04-14 ENCOUNTER — Telehealth: Payer: Self-pay | Admitting: Family Medicine

## 2019-04-14 ENCOUNTER — Other Ambulatory Visit: Payer: Self-pay | Admitting: Orthopedic Surgery

## 2019-04-14 MED ORDER — TRAMADOL HCL 50 MG PO TABS: 50.0000 mg | ORAL_TABLET | Freq: Every evening | ORAL | 0 refills | Status: DC | PRN

## 2019-04-14 NOTE — Telephone Encounter (Signed)
I called and advised the patient of the Tramadol.  She should continue her other medications and keep next week's appointment with PT.

## 2019-04-14 NOTE — Telephone Encounter (Signed)
The patient says she has been taking the celebrex bid and gabapentin bid and they are not helping her pain. She also has tizanidine, but it just makes her sleepy (only takes at night prn). PT was to start last week, but her car broke down and she had to cancel it. Her appointment was rescheduled to 04/20/19. She is asking for something for pain.

## 2019-04-14 NOTE — Telephone Encounter (Signed)
Patient called needing something called into her pharmacy for pain. The number to contact patient is [phone]

## 2019-04-14 NOTE — Telephone Encounter (Signed)
Tramadol sent. 

## 2019-04-15 ENCOUNTER — Other Ambulatory Visit: Payer: Self-pay | Admitting: Family Medicine

## 2019-04-15 ENCOUNTER — Other Ambulatory Visit: Payer: Self-pay

## 2019-04-15 MED ORDER — TRAMADOL HCL 50 MG PO TABS: 50.0000 mg | ORAL_TABLET | Freq: Every evening | ORAL | 0 refills | Status: DC | PRN

## 2019-04-15 MED ORDER — TRAMADOL HCL 50 MG PO TABS: 50.0000 mg | ORAL_TABLET | Freq: Every evening | ORAL | 0 refills | Status: AC | PRN

## 2019-04-17 ENCOUNTER — Other Ambulatory Visit: Payer: Self-pay | Admitting: Gastroenterology

## 2019-04-17 DIAGNOSIS — K6289 Other specified diseases of anus and rectum: Secondary | ICD-10-CM

## 2019-04-20 ENCOUNTER — Other Ambulatory Visit: Payer: Self-pay

## 2019-04-20 ENCOUNTER — Ambulatory Visit: Payer: Medicaid Other | Attending: Family Medicine

## 2019-04-20 DIAGNOSIS — G8929 Other chronic pain: Secondary | ICD-10-CM | POA: Insufficient documentation

## 2019-04-20 DIAGNOSIS — M5442 Lumbago with sciatica, left side: Secondary | ICD-10-CM | POA: Insufficient documentation

## 2019-04-20 DIAGNOSIS — M62830 Muscle spasm of back: Secondary | ICD-10-CM | POA: Diagnosis present

## 2019-04-20 DIAGNOSIS — R293 Abnormal posture: Secondary | ICD-10-CM | POA: Diagnosis present

## 2019-04-20 NOTE — Therapy (Signed)
[place], [place], [address] Phone: [phone]   Fax:  [phone]  Physical Therapy Evaluation  Patient Details  Name: Alisha Espinoza MRN: [id number] Date of Birth: December 08, 1965 Referring Provider (PT): Eunice Blase , MD    Encounter Date: 04/20/2019  PT End of Session - 04/20/19 0956    Visit Number  1    Number of Visits  12    Date for PT Re-Evaluation  06/11/19    Authorization Type  MCD    PT Start Time  0915    PT Stop Time  1010    PT Time Calculation (min)  55 min    Activity Tolerance  Patient tolerated treatment well;Patient limited by pain    Behavior During Therapy  [facility] for tasks assessed/performed       Past Medical History:  Diagnosis Date  . Bronchitis   . Diabetes mellitus ([place]) 01/19/2019  . Dyspnea   . Heart murmur    resolved "closed by age 85."   . Hyperlipidemia   . Hypertension   . Seasonal allergies     Past Surgical History:  Procedure Laterality Date  . ABDOMINAL HYSTERECTOMY  2000  . ANTERIOR CERVICAL DECOMP/DISCECTOMY FUSION  03/19/2012   Procedure: ANTERIOR CERVICAL DECOMPRESSION/DISCECTOMY FUSION 1 LEVEL;  Surgeon: Melina Schools, MD;  Location: [place];  Service: Orthopedics;  Laterality: Left;  Total Disc Replacement C4-5  . ANTERIOR CERVICAL DECOMP/DISCECTOMY FUSION N/A 05/07/2012   Procedure: ANTERIOR CERVICAL DECOMPRESSION/DISCECTOMY FUSION 1 LEVEL/HARDWARE REMOVAL;  Surgeon: Melina Schools, MD;  Location: [place];  Service: Orthopedics;  Laterality: N/A;  REMOVAL OF CERVICAL DISC REPLACEMENT AND ACDF C4-5  . BACK SURGERY    . CARPAL TUNNEL RELEASE Right 09/28/2018   Procedure: CARPAL TUNNEL RELEASE;  Surgeon: Eustace Moore, MD;  Location: [place];  Service: Neurosurgery;  Laterality: Right;  CARPAL TUNNEL RELEASE  . CERVICAL FUSION  05/07/2012   Dr Rolena Infante  . COLONOSCOPY    . CYSTECTOMY     L wrist  . POLYPECTOMY    . ROTATOR CUFF REPAIR     Right  .  TYMPANOSTOMY TUBE PLACEMENT      There were no vitals filed for this visit.   Subjective Assessment - 04/20/19 0919    Subjective  She reports back pain.  She has had lumbar surgery.     RT leg pain before first surgery and now LT leg has some mild  pain. Now on pain meds for nerve as LT foot and nkle hurts.    Pertinent History  L3-4 L4-5  f usion.       Cervical surgery x 2  one a fusion    Limitations  Standing;Walking;House hold activities    How long can you walk comfortably?  walks in parking lot.  50 feet    Patient Stated Goals  She wants to have to have less pain    Currently in Pain?  Yes    Pain Score  0-No pain    Pain Location  Back    Pain Orientation  Lower   central   Pain Descriptors / Indicators  Sharp    Pain Type  Chronic pain    Pain Radiating Towards  inside LT ankle and Lt quads    Pain Onset  More than a month ago    Pain Frequency  Constant   in foot   Aggravating Factors   standing and walking    Pain Relieving  Factors  prop foot,   meds, walk    Effect of Pain on Daily Activities  limits activity during day         [facility] PT Assessment - 04/20/19 0001      Assessment   Medical Diagnosis  back pain    Referring Provider (PT)  Eunice Blase , MD     Onset Date/Surgical Date  --   2017 back pain started.    Next MD Visit  05/10/19    Prior Therapy  No      Precautions   Precautions  None      Restrictions   Weight Bearing Restrictions  No      Balance Screen   Has the patient fallen in the past 6 months  No      Prior Function   Level of Independence  Needs assistance with homemaking   limits heavy lifting   Vocation  Unemployed      Cognition   Overall Cognitive Status  Within Functional Limits for tasks assessed      Posture/Postural Control   Posture/Postural Control  No significant limitations      ROM / Strength   AROM / PROM / Strength  AROM;Strength;PROM      AROM   AROM Assessment Site  Lumbar    Lumbar Flexion  75     Lumbar Extension  5    Lumbar - Right Side Bend  10     Lumbar - Left Side Bend  5    Lumbar - Right Rotation  60%    Lumbar - Left Rotation  60%       PROM   Overall PROM Comments  Hips WFL      Strength   Overall Strength Comments  LE WNL      Flexibility   Soft Tissue Assessment /Muscle Length  yes    Hamstrings  80 degrees SLR bilateral,   + obers RT                 Objective measurements completed on examination: See above findings.      [facility] PT Treatment/Exercise - 04/20/19 0001      Modalities   Modalities  Moist Heat      Moist Heat Therapy   Number Minutes Moist Heat  10 Minutes    Moist Heat Location  Lumbar Spine             PT Education - 04/20/19 0926    Education Details  POC   HEP    Person(s) Educated  Patient    Methods  Explanation;Demonstration;Tactile cues;Verbal cues;Handout    Comprehension  Verbalized understanding;Returned demonstration       PT Short Term Goals - 04/20/19 1010      PT SHORT TERM GOAL #1   Title  She will be indpendent with initial hEP    Baseline  no program    Time  2    Period  Weeks    Status  New      PT SHORT TERM GOAL #2   Title  She will report less pain with walking in parlking lot at home.    Baseline  report 50 feet and needs to stop    Time  2    Period  Weeks    Status  New        PT Long Term Goals - 04/20/19 1011      PT LONG TERM GOAL #1  Title  She will be indpendent with all HEP issued    Baseline  indpendent with initial hEP    Time  6    Period  Weeks    Status  New      PT LONG TERM GOAL #2   Title  She will report pain with walking in community  decreased 50% or more with shopping    Baseline  limits time shopping and community activity due to back pain    Time  6    Period  Weeks    Status  New      PT LONG TERM GOAL #3   Title  She will report 50% increase in hoome tasks without pain    Baseline  all home tasks cause some pain    Time  6    Period   Weeks    Status  New      PT LONG TERM GOAL #4   Title  She will report understanding of use of cold or heat and stretching to ease any increase pain    Baseline  rest to ease pain but not completely    Time  6    Period  Weeks    Status  New             Plan - 04/20/19 0957    Clinical Impression Statement  Ms Alberici reports chronic back pain post 2 surgeries last a fusion  She is tender across her lower back and has limitied back movement. Stretching reportedly felt good but was sore after eval that was eased  with heat at the end /. She should do better with skilled PT and consistent HEP.    Personal Factors and Comorbidities  Past/Current Experience;Time since onset of injury/illness/exacerbation;Comorbidity 1;Comorbidity 2    Comorbidities  2 lumba surferies, 2 cervical surgeries    Examination-Activity Limitations  Bed Mobility;Carry;Lift;Stand;Locomotion Level    Examination-Participation Restrictions  Community Activity;Laundry;Shop;Cleaning    Stability/Clinical Decision Making  Evolving/Moderate complexity    Clinical Decision Making  Moderate    Rehab Potential  Good    PT Frequency  --   3 visits   PT Duration  2 weeks   then 2x/week for 4 weeks   PT Treatment/Interventions  Electrical Stimulation;Iontophoresis 4mg /ml Dexamethasone;Moist Heat;Therapeutic exercise;Therapeutic activities;Patient/family education;Passive range of motion;Dry needling;Manual techniques;Taping    PT Next Visit Plan  REveiw HEP and progress , Manual and modalities for pain.    PT Home Exercise Plan  PPT LTR, SKC, bridge    Consulted and Agree with Plan of Care  Patient       Patient will benefit from skilled therapeutic intervention in order to improve the following deficits and impairments:  Pain, Increased muscle spasms, Decreased strength, Decreased activity tolerance, Difficulty walking  Visit Diagnosis: Chronic bilateral low back pain with left-sided sciatica  Abnormal  posture  Muscle spasm of back     Problem List Patient Active Problem List   Diagnosis Date Noted  . 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 Hemorrhoids 01/21/2019  . Essential hypertension 01/21/2019  . Serum calcium elevated 01/21/2019  . Elevated cholesterol 01/19/2019  . Diabetes mellitus ([place]) 01/19/2019  . 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 Acute bronchitis 08/08/2015  . Leukocytosis 08/08/2015  . Hyperglycemia 08/08/2015  . Tobacco abuse 08/08/2015  . 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 BACTERIAL PNEUMONIA, RIGHT LOWER LOBE 04/27/2008  . LUMP OR MASS IN BREAST 04/27/2008  . COUGH 04/27/2008    Noralee Stain  M PT 04/20/2019, 10:43 AM  [place], [place], [address] Phone: [phone]   Fax:  [phone]  Name: KURT HUSS MRN: [id number] Date of Birth: 1965-05-25

## 2019-04-20 NOTE — Patient Instructions (Signed)
PPT , bridge  X 10 2x/day hold 5 sec,         LTR , Knee to chest  2x/day  Hold 30 sec RT and LT  2-3 reps

## 2019-04-26 ENCOUNTER — Encounter: Payer: Self-pay | Admitting: Physical Therapy

## 2019-04-26 ENCOUNTER — Encounter (HOSPITAL_BASED_OUTPATIENT_CLINIC_OR_DEPARTMENT_OTHER): Payer: Self-pay | Admitting: Orthopedic Surgery

## 2019-04-26 ENCOUNTER — Ambulatory Visit: Payer: Medicaid Other | Admitting: Physical Therapy

## 2019-04-26 ENCOUNTER — Encounter: Payer: Self-pay | Admitting: Gastroenterology

## 2019-04-26 ENCOUNTER — Ambulatory Visit (INDEPENDENT_AMBULATORY_CARE_PROVIDER_SITE_OTHER): Payer: Medicaid Other | Admitting: Gastroenterology

## 2019-04-26 ENCOUNTER — Other Ambulatory Visit: Payer: Self-pay

## 2019-04-26 VITALS — BP 126/86 | HR 88 | Temp 98.3°F | Ht 65.0 in | Wt 192.1 lb

## 2019-04-26 DIAGNOSIS — R293 Abnormal posture: Secondary | ICD-10-CM

## 2019-04-26 DIAGNOSIS — M62830 Muscle spasm of back: Secondary | ICD-10-CM

## 2019-04-26 DIAGNOSIS — K6289 Other specified diseases of anus and rectum: Secondary | ICD-10-CM

## 2019-04-26 DIAGNOSIS — K5904 Chronic idiopathic constipation: Secondary | ICD-10-CM | POA: Diagnosis not present

## 2019-04-26 DIAGNOSIS — M5442 Lumbago with sciatica, left side: Secondary | ICD-10-CM

## 2019-04-26 DIAGNOSIS — G8929 Other chronic pain: Secondary | ICD-10-CM

## 2019-04-26 NOTE — Therapy (Signed)
[place], [place], [address] Phone: [phone]   Fax:  [phone]  Physical Therapy Treatment  Patient Details  Name: Alisha Espinoza MRN: [id number] Date of Birth: 1965/12/12 Referring Provider (PT): Eunice Blase , MD    Encounter Date: 04/26/2019  PT End of Session - 04/26/19 0854    Visit Number  2    Number of Visits  12    Date for PT Re-Evaluation  06/11/19    Authorization Type  MCD    Authorization Time Period  04/26/19-05/16/19    PT Start Time  0845    PT Stop Time  0936    PT Time Calculation (min)  51 min    Activity Tolerance  Patient limited by pain    Behavior During Therapy  [facility] for tasks assessed/performed       Past Medical History:  Diagnosis Date  . Bronchitis   . Diabetes mellitus ([place]) 01/19/2019  . Dyspnea   . Heart murmur    resolved "closed by age 55."   . Hyperlipidemia   . Hypertension   . Seasonal allergies     Past Surgical History:  Procedure Laterality Date  . ABDOMINAL HYSTERECTOMY  2000  . ANTERIOR CERVICAL DECOMP/DISCECTOMY FUSION  03/19/2012   Procedure: ANTERIOR CERVICAL DECOMPRESSION/DISCECTOMY FUSION 1 LEVEL;  Surgeon: Melina Schools, MD;  Location: [place];  Service: Orthopedics;  Laterality: Left;  Total Disc Replacement C4-5  . ANTERIOR CERVICAL DECOMP/DISCECTOMY FUSION N/A 05/07/2012   Procedure: ANTERIOR CERVICAL DECOMPRESSION/DISCECTOMY FUSION 1 LEVEL/HARDWARE REMOVAL;  Surgeon: Melina Schools, MD;  Location: [place];  Service: Orthopedics;  Laterality: N/A;  REMOVAL OF CERVICAL DISC REPLACEMENT AND ACDF C4-5  . BACK SURGERY    . CARPAL TUNNEL RELEASE Right 09/28/2018   Procedure: CARPAL TUNNEL RELEASE;  Surgeon: Eustace Moore, MD;  Location: [place];  Service: Neurosurgery;  Laterality: Right;  CARPAL TUNNEL RELEASE  . CERVICAL FUSION  05/07/2012   Dr Rolena Infante  . COLONOSCOPY    . CYSTECTOMY     L wrist  . POLYPECTOMY    . ROTATOR CUFF REPAIR     Right   . TYMPANOSTOMY TUBE PLACEMENT      There were no vitals filed for this visit.  Subjective Assessment - 04/26/19 0847    Subjective  Pt. reports did exercises 1 day since eval but had some back soreness and has been dealing with some other health issues so did not try performing otherwise.    Currently in Pain?  Yes    Pain Score  5     Pain Location  Back    Pain Orientation  Lower    Pain Descriptors / Indicators  Dull    Pain Type  Chronic pain    Pain Radiating Towards  intermittently to left thigh    Pain Onset  More than a month ago    Pain Frequency  Constant    Aggravating Factors   standing and walking    Pain Relieving Factors  prop foot, meds, walk    Effect of Pain on Daily Activities  limits activity during day                       [facility] Adult PT Treatment/Exercise - 04/26/19 0001      Exercises   Exercises  Lumbar      Lumbar Exercises: Stretches   Passive Hamstring Stretch  Right;Left;2 reps;30 seconds    Single  Knee to Chest Stretch  Right;Left;5 reps    Single Knee to Chest Stretch Limitations  5-10 sec holds    Lower Trunk Rotation  3 reps;10 seconds    Lower Trunk Rotation Limitations  limited to 3 reps due to pain    Piriformis Stretch  Right;Left;2 reps;30 seconds      Lumbar Exercises: Aerobic   Nustep  L2 x 5 min UE/LE      Lumbar Exercises: Supine   Pelvic Tilt  15 reps    Clam  15 reps    Clam Limitations  red Theraband    Bridge  10 reps    Bridge Limitations  partial bridge    Other Supine Lumbar Exercises  supine hip adduction isometric with ball 3-5 sec holds x 10      Moist Heat Therapy   Number Minutes Moist Heat  10 Minutes    Moist Heat Location  Lumbar Spine   in sitting     Manual Therapy   Manual Therapy  Soft tissue mobilization    Soft tissue mobilization  Lumbar paraspinals in prone over pillow             PT Education - 04/26/19 0857    Education Details  exercises, pain education    Person(s)  Educated  Patient    Methods  Explanation;Demonstration    Comprehension  Verbalized understanding;Returned demonstration;Verbal cues required       PT Short Term Goals - 04/20/19 1010      PT SHORT TERM GOAL #1   Title  She will be indpendent with initial hEP    Baseline  no program    Time  2    Period  Weeks    Status  New      PT SHORT TERM GOAL #2   Title  She will report less pain with walking in parlking lot at home.    Baseline  report 50 feet and needs to stop    Time  2    Period  Weeks    Status  New        PT Long Term Goals - 04/20/19 1011      PT LONG TERM GOAL #1   Title  She will be indpendent with all HEP issued    Baseline  indpendent with initial hEP    Time  6    Period  Weeks    Status  New      PT LONG TERM GOAL #2   Title  She will report pain with walking in community  decreased 50% or more with shopping    Baseline  limits time shopping and community activity due to back pain    Time  6    Period  Weeks    Status  New      PT LONG TERM GOAL #3   Title  She will report 50% increase in hoome tasks without pain    Baseline  all home tasks cause some pain    Time  6    Period  Weeks    Status  New      PT LONG TERM GOAL #4   Title  She will report understanding of use of cold or heat and stretching to ease any increase pain    Baseline  rest to ease pain but not completely    Time  6    Period  Weeks    Status  New  Plan - 04/26/19 0858    Clinical Impression Statement  Pt. returns for first follow up session after eval. Fair tx. tolerance due to general pain with exercises. Spent time also on pain education and "hurt vs. harm" with activity. Given chronicity of symptoms expect potential progress will be gradual with more time needed for exercises/tx. effect.    Personal Factors and Comorbidities  Past/Current Experience;Time since onset of injury/illness/exacerbation;Comorbidity 1;Comorbidity 2    Comorbidities  2 lumba  surferies, 2 cervical surgeries    Examination-Activity Limitations  Bed Mobility;Carry;Lift;Stand;Locomotion Level    Examination-Participation Restrictions  Community Activity;Laundry;Shop;Cleaning    Stability/Clinical Decision Making  Evolving/Moderate complexity    Clinical Decision Making  Moderate    Rehab Potential  Good    PT Frequency  --   3 visits   PT Duration  2 weeks    PT Treatment/Interventions  Electrical Stimulation;Iontophoresis 4mg /ml Dexamethasone;Moist Heat;Therapeutic exercise;Therapeutic activities;Patient/family education;Passive range of motion;Dry needling;Manual techniques;Taping    PT Next Visit Plan  continue/progress gentle exercises for ROM, core strengthening, gentle stretches, manual as needed, modalities prn for pain    PT Home Exercise Plan  PPT LTR, SKC, bridge    Consulted and Agree with Plan of Care  Patient       Patient will benefit from skilled therapeutic intervention in order to improve the following deficits and impairments:  Pain, Increased muscle spasms, Decreased strength, Decreased activity tolerance, Difficulty walking  Visit Diagnosis: Chronic bilateral low back pain with left-sided sciatica  Abnormal posture  Muscle spasm of back     Problem List Patient Active Problem List   Diagnosis Date Noted  . 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 Hemorrhoids 01/21/2019  . Essential hypertension 01/21/2019  . Serum calcium elevated 01/21/2019  . Elevated cholesterol 01/19/2019  . Diabetes mellitus ([place]) 01/19/2019  . 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 Acute bronchitis 08/08/2015  . Leukocytosis 08/08/2015  . Hyperglycemia 08/08/2015  . Tobacco abuse 08/08/2015  . 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 BACTERIAL PNEUMONIA, RIGHT LOWER LOBE 04/27/2008  . LUMP OR MASS IN BREAST 04/27/2008  . COUGH 04/27/2008    [name], PT, DPT 04/26/19 9:27 AM  [facility] [address] [place], [place], [address] Phone: [phone]   Fax:  [phone]  Name: Alisha Espinoza MRN: [id number] Date of Birth: 28-Oct-1965

## 2019-04-26 NOTE — Progress Notes (Signed)
Alisha Espinoza    [id number]    Sep 21, 1965  Primary Care Physician:Kremer, Mortimer Fries, MD  Referring Physician: Libby Maw, MD [place],  [place] [address]   Chief complaint: Anal burning sensation HPI:  54 year old female here with complaints of intermittent burning sensation in the anal canal.  It is worse before and right after bowel movement.  She does not wake up with rectal pain or spasm in the night.  Denies any rectal bleeding. Constipation improved with Linzess to 90 mcg daily, she has loose watery bowel movement occasionally but most of the time it is not formed.  Flexible sigmoidoscopy March 03, 2019:  Colonoscopy Aug 27, 2017: Multiple adenomatous sessile polyps removed ranging in size from 4 to 18 mm.  Diverticulosis and internal hemorrhoids  Colonoscopy September 18, 2018: Left-sided diverticulosis, internal hemorrhoids and 1 subcentimeter sessile polyp, tubular adenoma and 1 hyperplastic polyp was removed.  She was seen by [facility] surgery on 02/15/2019: She was prescribed hydrocortisone suppositories for hemorrhoids.    Outpatient Encounter Medications as of 04/26/2019  Medication Sig  . albuterol (PROVENTIL HFA;VENTOLIN HFA) 108 (90 Base) MCG/ACT inhaler Inhale 1-2 puffs into the lungs every 4 (four) hours as needed for wheezing or shortness of breath (or coughing).  . ANUCORT-HC 25 MG suppository PLACE 1 SUPPOSITORY RECTALLY ONCE A DAY FOR 1 WEEK AS NEEDED  . Blood Glucose Monitoring Suppl (CONTOUR NEXT MONITOR) w/Device KIT 1 each by Does not apply route daily. Use to test blood sugars 1-2 times daily.  . celecoxib (CELEBREX) 200 MG capsule Take 1 capsule (200 mg total) by mouth 2 (two) times daily as needed.  . Cholecalciferol (VITAMIN D-3) 125 MCG (5000 UT) TABS Take 1 tablet by mouth daily. (Patient not taking: Reported on 04/20/2019)  . gabapentin (NEURONTIN) 100 MG capsule 1 PO q HS, may increase to 3 PO q  HS if needed/tolerated  . glucose blood (CONTOUR NEXT TEST) test strip Use to test 1-2 times daily.  Marland Kitchen ibuprofen (ADVIL) 800 MG tablet Take 1 tablet (800 mg total) by mouth every 8 (eight) hours as needed. (Patient not taking: Reported on 04/20/2019)  . lidocaine (XYLOCAINE) 5 % ointment Apply 1 application topically 3 (three) times daily as needed. (Patient not taking: Reported on 04/20/2019)  . LINZESS 290 MCG CAPS capsule Take 1 capsule (290 mcg total) by mouth daily before breakfast.  . metFORMIN (GLUCOPHAGE) 500 MG tablet Take 1 tablet (500 mg total) by mouth 2 (two) times daily with a meal.  . Microlet Lancets MISC Use to test blood sugars 1-2 times daily.  . nabumetone (RELAFEN) 750 MG tablet Take 1 tablet (750 mg total) by mouth 2 (two) times daily as needed.  . simvastatin (ZOCOR) 20 MG tablet Take 20 mg by mouth daily.  Marland Kitchen tiZANidine (ZANAFLEX) 4 MG tablet Take 1 tablet by mouth every 8 (eight) hours as needed for muscle spasms.   . TRELEGY ELLIPTA 100-62.5-25 MCG/INH AEPB Inhale 1 puff into the lungs daily.  Marland Kitchen tretinoin (RETIN-A) 0.1 % cream APPLY A SMALL AMOUNT TO THE SKIN  EVERY NIGHT  . valsartan-hydrochlorothiazide (DIOVAN-HCT) 160-12.5 MG tablet Take 1 tablet by mouth daily.   No facility-administered encounter medications on file as of 04/26/2019.    Allergies as of 04/26/2019 - Review Complete 03/23/2019  Allergen Reaction Noted  . Meloxicam Hives 05/16/2011  . Mobic [meloxicam]  05/16/2011    Past Medical History:  Diagnosis  Date  . Bronchitis   . Diabetes mellitus (HCC) 01/19/2019  . Dyspnea   . Heart murmur    resolved "closed by age 12."   . Hyperlipidemia   . Hypertension   . Seasonal allergies     Past Surgical History:  Procedure Laterality Date  . ABDOMINAL HYSTERECTOMY  2000  . ANTERIOR CERVICAL DECOMP/DISCECTOMY FUSION  03/19/2012   Procedure: ANTERIOR CERVICAL DECOMPRESSION/DISCECTOMY FUSION 1 LEVEL;  Surgeon: Dahari Brooks, MD;  Location: [facility] OR;   Service: Orthopedics;  Laterality: Left;  Total Disc Replacement C4-5  . ANTERIOR CERVICAL DECOMP/DISCECTOMY FUSION N/A 05/07/2012   Procedure: ANTERIOR CERVICAL DECOMPRESSION/DISCECTOMY FUSION 1 LEVEL/HARDWARE REMOVAL;  Surgeon: Dahari Brooks, MD;  Location: [facility] OR;  Service: Orthopedics;  Laterality: N/A;  REMOVAL OF CERVICAL DISC REPLACEMENT AND ACDF C4-5  . BACK SURGERY    . CARPAL TUNNEL RELEASE Right 09/28/2018   Procedure: CARPAL TUNNEL RELEASE;  Surgeon: Jones, David S, MD;  Location: [facility] OR;  Service: Neurosurgery;  Laterality: Right;  CARPAL TUNNEL RELEASE  . CERVICAL FUSION  05/07/2012   Dr Brooks  . COLONOSCOPY    . CYSTECTOMY     L wrist  . POLYPECTOMY    . ROTATOR CUFF REPAIR     Right  . TYMPANOSTOMY TUBE PLACEMENT      Family History  Problem Relation Age of Onset  . Diabetes Mother   . Hypertension Father   . Pancreatic cancer Sister   . Colon cancer Neg Hx   . Rectal cancer Neg Hx   . Stomach cancer Neg Hx   . Colon polyps Neg Hx   . Esophageal cancer Neg Hx     Social History   Socioeconomic History  . Marital status: Divorced    Spouse name: Not on file  . Number of children: Not on file  . Years of education: Not on file  . Highest education level: Not on file  Occupational History  . Not on file  Tobacco Use  . Smoking status: Current Every Day Smoker    Packs/day: 0.50    Years: 30.00    Pack years: 15.00    Types: Cigarettes  . Smokeless tobacco: Never Used  Substance and Sexual Activity  . Alcohol use: Yes    Comment: 1-2 beers on occsion.   . Drug use: No  . Sexual activity: Yes    Birth control/protection: Surgical    Comment: Hysterectomy  Other Topics Concern  . Not on file  Social History Narrative  . Not on file   Social Determinants of Health   Financial Resource Strain:   . Difficulty of Paying Living Expenses: Not on file  Food Insecurity:   . Worried About Running Out of Food in the Last Year: Not on file  . Ran Out of Food  in the Last Year: Not on file  Transportation Needs:   . Lack of Transportation (Medical): Not on file  . Lack of Transportation (Non-Medical): Not on file  Physical Activity:   . Days of Exercise per Week: Not on file  . Minutes of Exercise per Session: Not on file  Stress:   . Feeling of Stress : Not on file  Social Connections:   . Frequency of Communication with Friends and Family: Not on file  . Frequency of Social Gatherings with Friends and Family: Not on file  . Attends Religious Services: Not on file  . Active Member of Clubs or Organizations: Not on file  . Attends Club or   Organization Meetings: Not on file  . Marital Status: Not on file  Intimate Partner Violence:   . Fear of Current or Ex-Partner: Not on file  . Emotionally Abused: Not on file  . Physically Abused: Not on file  . Sexually Abused: Not on file      Review of systems: Review of Systems  Constitutional: Negative for fever and chills.  HENT: Negative.   Eyes: Negative for blurred vision.  Respiratory: Negative for cough, shortness of breath and wheezing.   Cardiovascular: Negative for chest pain and palpitations.  Gastrointestinal: as per HPI Genitourinary: Negative for dysuria, urgency, frequency and hematuria.  Musculoskeletal: Negative for myalgias, back pain and joint pain.  Skin: Negative for itching and rash.  Neurological: Negative for dizziness, tremors, focal weakness, seizures and loss of consciousness.  Endo/Heme/Allergies: Negative Psychiatric/Behavioral: Negative for depression, suicidal ideas and hallucinations.  All other systems reviewed and are negative.   Physical Exam: Vitals:   04/26/19 1023  BP: 126/86  Pulse: 88  Temp: 98.3 F (36.8 C)   Body mass index is 31.97 kg/m. Gen:      No acute distress HEENT:  EOMI, sclera anicteric Neck:     No masses; no thyromegaly Lungs:    Clear to auscultation bilaterally; normal respiratory effort CV:         Regular rate and  rhythm; no murmurs Abd:      + bowel sounds; soft, non-tender; no palpable masses, no distension Ext:    No edema; adequate peripheral perfusion Skin:      Warm and dry; no rash Neuro: alert and oriented x 3 Psych: normal mood and affect Rectal exam: Small external hemorrhoid anteriorly with skin tag, increased anal sphincter tone, patient pulled my handout from her rectum, no anal nodularity or fissure  Data Reviewed:  Reviewed labs, radiology imaging, old records and pertinent past GI work up   Assessment and Plan/Recommendations:  53-year-old with chronic idiopathic constipation with complaints of intermittent anal burning sensation. Differential includes anal fissure versus anorectal spasm versus symptomatic hemorrhoids versus levator antispasm Based on history her main complaint is burning sensation rather than spasm or cramping Advised patient to use small pea-sized amount of RectiCare and nitroglycerin 0.125% externally near anal opening, advised her to avoid finger insertion into the anal canal to prevent trauma or injury. Recent flexible sigmoidoscopy negative for any significant pathology If continues to have persistent symptoms, will consider pelvic ultrasound for further evaluation  Chronic idiopathic constipation: Intermittent diarrhea with Linzess to 90 mcg daily, will decrease it to 145 mcg daily Continue high-fiber diet and increase water intake  Return in 2 months or sooner  This visit required 30 minutes of patient care (this includes precharting, chart review, review of results, face-to-face time used for counseling as well as treatment plan and follow-up. The patient was provided an opportunity to ask questions and all were answered. The patient agreed with the plan and demonstrated an understanding of the instructions.  K. Veena  , MD    CC: Kremer, William Alfred,*   

## 2019-04-26 NOTE — Patient Instructions (Addendum)
Discontinue Linzess 290 mcg and we will send in Linzess 145 mcg for you   Continue Nitroglycerin twice daily  Use OTC Recticare twice daily  Take Beneifiber 1 tablespoon twice daily  Follow up in 3 months   If you are age 54 or older, your body mass index should be between 23-30. Your Body mass index is 31.97 kg/m. If this is out of the aforementioned range listed, please consider follow up with your Primary Care Provider.  If you are age 77 or younger, your body mass index should be between 19-25. Your Body mass index is 31.97 kg/m. If this is out of the aformentioned range listed, please consider follow up with your Primary Care Provider.    I appreciate the  opportunity to care for you  Thank You   Harl Bowie , MD

## 2019-04-28 ENCOUNTER — Ambulatory Visit: Payer: Medicaid Other | Admitting: Physical Therapy

## 2019-04-28 ENCOUNTER — Other Ambulatory Visit: Payer: Self-pay

## 2019-04-28 ENCOUNTER — Encounter: Payer: Self-pay | Admitting: Physical Therapy

## 2019-04-28 DIAGNOSIS — M62830 Muscle spasm of back: Secondary | ICD-10-CM

## 2019-04-28 DIAGNOSIS — G8929 Other chronic pain: Secondary | ICD-10-CM

## 2019-04-28 DIAGNOSIS — M5442 Lumbago with sciatica, left side: Secondary | ICD-10-CM | POA: Diagnosis not present

## 2019-04-28 DIAGNOSIS — R293 Abnormal posture: Secondary | ICD-10-CM

## 2019-04-28 NOTE — Therapy (Signed)
[place], [place], [address] Phone: [phone]   Fax:  [phone]  Physical Therapy Treatment  Patient Details  Name: Alisha Espinoza MRN: [id number] Date of Birth: 1966-03-17 Referring Provider (PT): Eunice Blase , MD    Encounter Date: 04/28/2019  PT End of Session - 04/28/19 0847    Visit Number  3    Number of Visits  12    Date for PT Re-Evaluation  06/11/19    Authorization Type  MCD    Authorization Time Period  04/26/19-05/16/19    Authorization - Visit Number  2    Authorization - Number of Visits  3    PT Start Time  0846    PT Stop Time  0942    PT Time Calculation (min)  56 min    Activity Tolerance  Patient limited by pain    Behavior During Therapy  [facility] for tasks assessed/performed       Past Medical History:  Diagnosis Date  . Bronchitis   . Diabetes mellitus ([place]) 01/19/2019  . Dyspnea   . Heart murmur    resolved "closed by age 57."   . Hyperlipidemia   . Hypertension   . Seasonal allergies     Past Surgical History:  Procedure Laterality Date  . ABDOMINAL HYSTERECTOMY  2000  . 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 ANTERIOR CERVICAL DECOMP/DISCECTOMY FUSION N/A 05/07/2012   Procedure: ANTERIOR CERVICAL DECOMPRESSION/DISCECTOMY FUSION 1 LEVEL/HARDWARE REMOVAL;  Surgeon: Melina Schools, MD;  Location: [place];  Service: Orthopedics;  Laterality: N/A;  REMOVAL OF CERVICAL DISC REPLACEMENT AND ACDF C4-5  . BACK SURGERY    . CARPAL TUNNEL RELEASE Right 09/28/2018   Procedure: CARPAL TUNNEL RELEASE;  Surgeon: Eustace Moore, MD;  Location: [place];  Service: Neurosurgery;  Laterality: Right;  CARPAL TUNNEL RELEASE  . CERVICAL FUSION  05/07/2012   Dr Rolena Infante  . COLONOSCOPY    .  CYSTECTOMY     L wrist  . POLYPECTOMY    . ROTATOR CUFF REPAIR     Right  . TYMPANOSTOMY TUBE PLACEMENT      There were no vitals filed for this visit.  Subjective Assessment - 04/28/19 0901    Subjective  Soreness after last tx. session otherwise no new complaints this AM. Pt. is scheduled for finger surgery next week.                       [facility] Adult PT Treatment/Exercise - 04/28/19 0001      Lumbar Exercises: Stretches   Passive Hamstring Stretch  Right;Left;3 reps;30 seconds    Single Knee to Chest Stretch  Right;Left;3 reps;20 seconds    Lower Trunk Rotation  5 reps    Piriformis Stretch  Right;Left;2 reps;30 seconds      Lumbar Exercises: Aerobic   Nustep  L2 x 5 min UE/LE      Lumbar Exercises: Supine   Pelvic Tilt  10 reps    Pelvic Tilt Limitations  3-5 second holds    Clam  15 reps    Clam Limitations  red Theraband    Bent Knee Raise  10 reps    Bridge  10 reps    Bridge Limitations  partial bridge    Other Supine  Lumbar Exercises  supine hip adduction isometric with ball x 15 reps      Moist Heat Therapy   Number Minutes Moist Heat  15 Minutes    Moist Heat Location  Lumbar Spine   in sitting     Manual Therapy   Manual Therapy  Soft tissue mobilization    Soft tissue mobilization  Lumbar paraspinals in left sidelying with pillow between knees-pt. reports had some difficulty tolerating prone over pillow position last session             PT Education - 04/28/19 0914    Education Details  exercises, POC, pain education    Person(s) Educated  Patient    Methods  Explanation;Demonstration;Tactile cues;Verbal cues    Comprehension  Returned demonstration;Verbalized understanding       PT Short Term Goals - 04/20/19 1010      PT SHORT TERM GOAL #1   Title  She will be indpendent with initial hEP    Baseline  no program    Time  2    Period  Weeks    Status  New      PT SHORT TERM GOAL #2   Title  She will report less pain  with walking in parlking lot at home.    Baseline  report 50 feet and needs to stop    Time  2    Period  Weeks    Status  New        PT Long Term Goals - 04/20/19 1011      PT LONG TERM GOAL #1   Title  She will be indpendent with all HEP issued    Baseline  indpendent with initial hEP    Time  6    Period  Weeks    Status  New      PT LONG TERM GOAL #2   Title  She will report pain with walking in community  decreased 50% or more with shopping    Baseline  limits time shopping and community activity due to back pain    Time  6    Period  Weeks    Status  New      PT LONG TERM GOAL #3   Title  She will report 50% increase in hoome tasks without pain    Baseline  all home tasks cause some pain    Time  6    Period  Weeks    Status  New      PT LONG TERM GOAL #4   Title  She will report understanding of use of cold or heat and stretching to ease any increase pain    Baseline  rest to ease pain but not completely    Time  6    Period  Weeks    Status  New            Plan - 04/28/19 0915    Clinical Impression Statement  Fair status with cotinued high pain level and general soreness with exercises. As previously expect progress will take more time given symptom chronicity so will continue to progress as tolerated.    Personal Factors and Comorbidities  Past/Current Experience;Time since onset of injury/illness/exacerbation;Comorbidity 1;Comorbidity 2    Comorbidities  2 lumbar surgeries, 2 cervical surgeries    Examination-Activity Limitations  Bed Mobility;Carry;Lift;Stand;Locomotion Level    Examination-Participation Restrictions  Community Activity;Laundry;Shop;Cleaning    Stability/Clinical Decision Making  Evolving/Moderate complexity    Clinical Decision Making  Moderate    Rehab Potential  Good    PT Frequency  --   3 visits   PT Duration  2 weeks    PT Treatment/Interventions  Electrical Stimulation;Iontophoresis 4mg /ml Dexamethasone;Moist  Heat;Therapeutic exercise;Therapeutic activities;Patient/family education;Passive range of motion;Dry needling;Manual techniques;Taping    PT Next Visit Plan  continue/progress gentle exercises for ROM, core strengthening, gentle stretches, manual as needed, modalities prn for pain    PT Home Exercise Plan  PPT LTR, [facility], bridge    Consulted and Agree with Plan of Care  Patient       Patient will benefit from skilled therapeutic intervention in order to improve the following deficits and impairments:  Pain, Increased muscle spasms, Decreased strength, Decreased activity tolerance, Difficulty walking  Visit Diagnosis: Chronic bilateral low back pain with left-sided sciatica  Abnormal posture  Muscle spasm of back     Problem List Patient Active Problem List   Diagnosis Date Noted  . 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 Hemorrhoids 01/21/2019  . Essential hypertension 01/21/2019  . Serum calcium elevated 01/21/2019  . Elevated cholesterol 01/19/2019  . Diabetes mellitus ([place]) 01/19/2019  . 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 Acute bronchitis 08/08/2015  . Leukocytosis 08/08/2015  . Hyperglycemia 08/08/2015  . Tobacco abuse 08/08/2015  . 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 BACTERIAL PNEUMONIA, RIGHT LOWER LOBE 04/27/2008  . LUMP OR MASS IN BREAST 04/27/2008  . COUGH 04/27/2008    [name], PT, DPT 04/28/19 9:29 AM  [facility] [address] [place], [place], [address] Phone: [phone]   Fax:  [phone]  Name: Alisha Espinoza MRN: [id number] Date of Birth: November 12, 1965

## 2019-04-29 ENCOUNTER — Encounter (HOSPITAL_BASED_OUTPATIENT_CLINIC_OR_DEPARTMENT_OTHER)
Admission: RE | Admit: 2019-04-29 | Discharge: 2019-04-29 | Disposition: A | Discharge status: Home / Self Care | Payer: Medicaid Other | Source: Ambulatory Visit | Attending: Orthopedic Surgery | Admitting: Orthopedic Surgery

## 2019-04-29 ENCOUNTER — Other Ambulatory Visit (HOSPITAL_COMMUNITY)
Admission: RE | Admit: 2019-04-29 | Discharge: 2019-04-29 | Disposition: A | Discharge status: Home / Self Care | Payer: Medicaid Other | Source: Ambulatory Visit | Attending: Orthopedic Surgery | Admitting: Orthopedic Surgery

## 2019-04-29 DIAGNOSIS — Z01812 Encounter for preprocedural laboratory examination: Secondary | ICD-10-CM | POA: Diagnosis not present

## 2019-04-29 DIAGNOSIS — Z20822 Contact with and (suspected) exposure to covid-19: Secondary | ICD-10-CM | POA: Insufficient documentation

## 2019-04-29 LAB — BASIC METABOLIC PANEL
Anion gap: 9 (ref 5–15)
BUN: 15 mg/dL (ref 6–20)
CO2: 25 mmol/L (ref 22–32)
Calcium: 10.4 mg/dL — ABNORMAL HIGH (ref 8.9–10.3)
Chloride: 106 mmol/L (ref 98–111)
Creatinine, Ser: 0.91 mg/dL (ref 0.44–1.00)
GFR calc Af Amer: >60 mL/min (ref >60)
GFR calc non Af Amer: >60 mL/min (ref >60)
Glucose, Bld: 75 mg/dL (ref 70–99)
Potassium: 4.3 mmol/L (ref 3.5–5.1)
Sodium: 140 mmol/L (ref 135–145)

## 2019-04-29 LAB — SARS CORONAVIRUS 2 (TAT 6-24 HRS): SARS Coronavirus 2: NEGATIVE

## 2019-04-29 NOTE — Progress Notes (Signed)

## 2019-04-30 ENCOUNTER — Telehealth: Payer: Self-pay | Admitting: Family Medicine

## 2019-04-30 NOTE — Telephone Encounter (Signed)
I called the patient and advised her Dr. Junius Roads has provided a handicap placard application. Mailing this to the patient's home address per request.

## 2019-04-30 NOTE — Telephone Encounter (Signed)
Patient called requesting handicap placid from Dr. Junius Roads office. Patient asked for call back for request. Patient phone number [phone]

## 2019-05-03 ENCOUNTER — Ambulatory Visit (HOSPITAL_BASED_OUTPATIENT_CLINIC_OR_DEPARTMENT_OTHER): Payer: Medicaid Other | Admitting: Anesthesiology

## 2019-05-03 ENCOUNTER — Encounter (HOSPITAL_BASED_OUTPATIENT_CLINIC_OR_DEPARTMENT_OTHER): Payer: Self-pay | Admitting: Orthopedic Surgery

## 2019-05-03 ENCOUNTER — Ambulatory Visit (HOSPITAL_BASED_OUTPATIENT_CLINIC_OR_DEPARTMENT_OTHER)
Admission: RE | Admit: 2019-05-03 | Discharge: 2019-05-03 | Disposition: A | Discharge status: Home / Self Care | Payer: Medicaid Other | Attending: Orthopedic Surgery | Admitting: Orthopedic Surgery

## 2019-05-03 ENCOUNTER — Other Ambulatory Visit: Payer: Self-pay

## 2019-05-03 ENCOUNTER — Encounter (HOSPITAL_BASED_OUTPATIENT_CLINIC_OR_DEPARTMENT_OTHER)
Admission: RE | Disposition: A | Discharge status: Home / Self Care | Payer: Self-pay | Source: Home / Self Care | Attending: Orthopedic Surgery

## 2019-05-03 DIAGNOSIS — Z79899 Other long term (current) drug therapy: Secondary | ICD-10-CM | POA: Diagnosis not present

## 2019-05-03 DIAGNOSIS — E119 Type 2 diabetes mellitus without complications: Secondary | ICD-10-CM | POA: Insufficient documentation

## 2019-05-03 DIAGNOSIS — M65342 Trigger finger, left ring finger: Secondary | ICD-10-CM | POA: Insufficient documentation

## 2019-05-03 DIAGNOSIS — E785 Hyperlipidemia, unspecified: Secondary | ICD-10-CM | POA: Insufficient documentation

## 2019-05-03 DIAGNOSIS — Z833 Family history of diabetes mellitus: Secondary | ICD-10-CM | POA: Diagnosis not present

## 2019-05-03 DIAGNOSIS — I10 Essential (primary) hypertension: Secondary | ICD-10-CM | POA: Insufficient documentation

## 2019-05-03 DIAGNOSIS — M65332 Trigger finger, left middle finger: Secondary | ICD-10-CM | POA: Diagnosis present

## 2019-05-03 DIAGNOSIS — Z888 Allergy status to other drugs, medicaments and biological substances status: Secondary | ICD-10-CM | POA: Diagnosis not present

## 2019-05-03 DIAGNOSIS — Z791 Long term (current) use of non-steroidal anti-inflammatories (NSAID): Secondary | ICD-10-CM | POA: Diagnosis not present

## 2019-05-03 DIAGNOSIS — Z7984 Long term (current) use of oral hypoglycemic drugs: Secondary | ICD-10-CM | POA: Insufficient documentation

## 2019-05-03 DIAGNOSIS — Z7951 Long term (current) use of inhaled steroids: Secondary | ICD-10-CM | POA: Insufficient documentation

## 2019-05-03 DIAGNOSIS — F17210 Nicotine dependence, cigarettes, uncomplicated: Secondary | ICD-10-CM | POA: Insufficient documentation

## 2019-05-03 DIAGNOSIS — Z8249 Family history of ischemic heart disease and other diseases of the circulatory system: Secondary | ICD-10-CM | POA: Insufficient documentation

## 2019-05-03 HISTORY — PX: TRIGGER FINGER RELEASE: SHX641

## 2019-05-03 LAB — GLUCOSE, CAPILLARY
Glucose-Capillary: 99 mg/dL (ref 70–99)
Glucose-Capillary: 95 mg/dL (ref 70–99)

## 2019-05-03 SURGERY — RELEASE, A1 PULLEY, FOR TRIGGER FINGER
Anesthesia: Regional | Site: Hand | Laterality: Left

## 2019-05-03 MED ORDER — PROMETHAZINE HCL 25 MG/ML IJ SOLN: 6.2500 mg | INTRAMUSCULAR | Status: DC | PRN

## 2019-05-03 MED ORDER — ONDANSETRON HCL 4 MG/2ML IJ SOLN
INTRAMUSCULAR | Status: AC
  Filled 2019-05-03: qty 2

## 2019-05-03 MED ORDER — OXYCODONE HCL 5 MG/5ML PO SOLN: 5.0000 mg | Freq: Once | ORAL | Status: DC | PRN

## 2019-05-03 MED ORDER — FENTANYL CITRATE (PF) 100 MCG/2ML IJ SOLN
INTRAMUSCULAR | Status: AC
  Filled 2019-05-03: qty 2

## 2019-05-03 MED ORDER — CEFAZOLIN SODIUM-DEXTROSE 2-4 GM/100ML-% IV SOLN
INTRAVENOUS | Status: AC
  Filled 2019-05-03: qty 100

## 2019-05-03 MED ORDER — PROPOFOL 500 MG/50ML IV EMUL
INTRAVENOUS | Status: DC | PRN
  Administered 2019-05-03: 50 ug/kg/min via INTRAVENOUS

## 2019-05-03 MED ORDER — OXYCODONE HCL 5 MG PO TABS: 5.0000 mg | ORAL_TABLET | Freq: Once | ORAL | Status: DC | PRN

## 2019-05-03 MED ORDER — CEFAZOLIN SODIUM-DEXTROSE 2-4 GM/100ML-% IV SOLN
2.0000 g | INTRAVENOUS | Status: AC
  Administered 2019-05-03: 2 g via INTRAVENOUS

## 2019-05-03 MED ORDER — BUPIVACAINE HCL (PF) 0.25 % IJ SOLN
INTRAMUSCULAR | Status: DC | PRN
  Administered 2019-05-03: 10 mL

## 2019-05-03 MED ORDER — HYDROMORPHONE HCL 1 MG/ML IJ SOLN: 0.2500 mg | INTRAMUSCULAR | Status: DC | PRN

## 2019-05-03 MED ORDER — CHLORHEXIDINE GLUCONATE 4 % EX LIQD: 60.0000 mL | Freq: Once | CUTANEOUS | Status: DC

## 2019-05-03 MED ORDER — LIDOCAINE 2% (20 MG/ML) 5 ML SYRINGE
INTRAMUSCULAR | Status: AC
  Filled 2019-05-03: qty 5

## 2019-05-03 MED ORDER — LIDOCAINE HCL (PF) 0.5 % IJ SOLN
INTRAMUSCULAR | Status: DC | PRN
  Administered 2019-05-03: 35 mL via INTRAVENOUS

## 2019-05-03 MED ORDER — LACTATED RINGERS IV SOLN: INTRAVENOUS | Status: DC

## 2019-05-03 MED ORDER — FENTANYL CITRATE (PF) 100 MCG/2ML IJ SOLN
50.0000 ug | INTRAMUSCULAR | Status: DC | PRN
  Administered 2019-05-03: 50 ug via INTRAVENOUS
  Administered 2019-05-03: 25 ug via INTRAVENOUS

## 2019-05-03 MED ORDER — BUPIVACAINE HCL (PF) 0.25 % IJ SOLN
INTRAMUSCULAR | Status: AC
  Filled 2019-05-03: qty 30

## 2019-05-03 MED ORDER — ONDANSETRON HCL 4 MG/2ML IJ SOLN
INTRAMUSCULAR | Status: DC | PRN
  Administered 2019-05-03: 4 mg via INTRAVENOUS

## 2019-05-03 MED ORDER — MIDAZOLAM HCL 2 MG/2ML IJ SOLN
1.0000 mg | INTRAMUSCULAR | Status: DC | PRN
  Administered 2019-05-03: 2 mg via INTRAVENOUS

## 2019-05-03 MED ORDER — MIDAZOLAM HCL 2 MG/2ML IJ SOLN
INTRAMUSCULAR | Status: AC
  Filled 2019-05-03: qty 2

## 2019-05-03 MED ORDER — HYDROCODONE-ACETAMINOPHEN 5-325 MG PO TABS: ORAL_TABLET | ORAL | 0 refills | Status: DC

## 2019-05-03 MED ORDER — MEPERIDINE HCL 25 MG/ML IJ SOLN: 6.2500 mg | INTRAMUSCULAR | Status: DC | PRN

## 2019-05-03 SURGICAL SUPPLY — 33 items
BLADE SURG 15 STRL LF DISP TIS (BLADE) ×2 IMPLANT
BLADE SURG 15 STRL SS (BLADE) ×2
BNDG COHESIVE 2X5 TAN STRL LF (GAUZE/BANDAGES/DRESSINGS) ×2 IMPLANT
BNDG ESMARK 4X9 LF (GAUZE/BANDAGES/DRESSINGS) ×2 IMPLANT
CHLORAPREP W/TINT 26 (MISCELLANEOUS) ×2 IMPLANT
CORD BIPOLAR FORCEPS 12FT (ELECTRODE) ×2 IMPLANT
COVER BACK TABLE 60X90IN (DRAPES) ×2 IMPLANT
COVER MAYO STAND STRL (DRAPES) ×2 IMPLANT
COVER WAND RF STERILE (DRAPES) IMPLANT
CUFF TOURN SGL QUICK 18X4 (TOURNIQUET CUFF) ×2 IMPLANT
DRAPE EXTREMITY T 121X128X90 (DISPOSABLE) ×2 IMPLANT
DRAPE SURG 17X23 STRL (DRAPES) ×2 IMPLANT
FACESHIELD WRAPAROUND (MASK) ×2 IMPLANT
GAUZE SPONGE 4X4 12PLY STRL (GAUZE/BANDAGES/DRESSINGS) ×2 IMPLANT
GAUZE XEROFORM 1X8 LF (GAUZE/BANDAGES/DRESSINGS) ×2 IMPLANT
GLOVE BIO SURGEON STRL SZ7.5 (GLOVE) ×4 IMPLANT
GLOVE BIOGEL PI IND STRL 6.5 (GLOVE) ×1 IMPLANT
GLOVE BIOGEL PI IND STRL 8 (GLOVE) ×1 IMPLANT
GLOVE BIOGEL PI INDICATOR 6.5 (GLOVE) ×1
GLOVE BIOGEL PI INDICATOR 8 (GLOVE) ×1
GLOVE ECLIPSE 6.5 STRL STRAW (GLOVE) ×2 IMPLANT
GOWN STRL REUS W/ TWL LRG LVL3 (GOWN DISPOSABLE) ×1 IMPLANT
GOWN STRL REUS W/TWL LRG LVL3 (GOWN DISPOSABLE) ×1
GOWN STRL REUS W/TWL XL LVL3 (GOWN DISPOSABLE) ×2 IMPLANT
NEEDLE HYPO 25X1 1.5 SAFETY (NEEDLE) ×2 IMPLANT
NS IRRIG 1000ML POUR BTL (IV SOLUTION) ×2 IMPLANT
PACK BASIN DAY SURGERY FS (CUSTOM PROCEDURE TRAY) ×2 IMPLANT
STOCKINETTE 4X48 STRL (DRAPES) ×2 IMPLANT
SUT ETHILON 4 0 PS 2 18 (SUTURE) ×2 IMPLANT
SYR BULB 3OZ (MISCELLANEOUS) ×2 IMPLANT
SYR CONTROL 10ML LL (SYRINGE) ×2 IMPLANT
TOWEL GREEN STERILE FF (TOWEL DISPOSABLE) ×4 IMPLANT
UNDERPAD 30X36 HEAVY ABSORB (UNDERPADS AND DIAPERS) ×2 IMPLANT

## 2019-05-03 NOTE — Anesthesia Postprocedure Evaluation (Signed)
Anesthesia Post Note  Patient: Alisha Espinoza  Procedure(s) Performed: LEFT LONG AND RING FINGER RELEASE TRIGGER FINGER/A-1 PULLEY (Left Hand)     Patient location during evaluation: PACU Anesthesia Type: Bier Block Level of consciousness: awake and alert Pain management: pain level controlled Vital Signs Assessment: post-procedure vital signs reviewed and stable Respiratory status: spontaneous breathing Cardiovascular status: stable Anesthetic complications: no    Last Vitals:  Vitals:   05/03/19 1415 05/03/19 1455  BP: 115/79 106/82  Pulse: 86 78  Resp: 20 20  Temp:  36.9 C  SpO2: 97% 96%    Last Pain:  Vitals:   05/03/19 1455  TempSrc: Oral  PainSc: 0-No pain                 Nolon Nations

## 2019-05-03 NOTE — Anesthesia Preprocedure Evaluation (Addendum)
Anesthesia Evaluation  Patient identified by MRN, date of birth, ID band Patient awake    Reviewed: Allergy & Precautions, NPO status , Patient's Chart, lab work & pertinent test results  Airway Mallampati: II  TM Distance: >3 FB     Dental  (+) Dental Advisory Given   Pulmonary shortness of breath, pneumonia, Current Smoker and Patient abstained from smoking.,    breath sounds clear to auscultation       Cardiovascular hypertension, + Valvular Problems/Murmurs  Rhythm:Regular Rate:Normal     Neuro/Psych    GI/Hepatic negative GI ROS, Neg liver ROS,   Endo/Other  diabetes  Renal/GU negative Renal ROS     Musculoskeletal   Abdominal   Peds  Hematology   Anesthesia Other Findings   Reproductive/Obstetrics                            Anesthesia Physical  Anesthesia Plan  ASA: II  Anesthesia Plan: Bier Block and Bier Block-LIDOCAINE ONLY   Post-op Pain Management:    Induction: Intravenous  PONV Risk Score and Plan: Ondansetron and Midazolam  Airway Management Planned: Natural Airway and Simple Face Mask  Additional Equipment:   Intra-op Plan:   Post-operative Plan:   Informed Consent: I have reviewed the patients History and Physical, chart, labs and discussed the procedure including the risks, benefits and alternatives for the proposed anesthesia with the patient or authorized representative who has indicated his/her understanding and acceptance.     Dental advisory given  Plan Discussed with: CRNA  Anesthesia Plan Comments:       Anesthesia Quick Evaluation

## 2019-05-03 NOTE — H&P (Signed)
Alisha Espinoza is an 54 y.o. female.   Chief Complaint: trigger fingers HPI: 54 yo female with triggering of left long and ring fingers.  These have been injected without lasting relief.  She wishes to have trigger releases.  Allergies:  Allergies  Allergen Reactions  . Meloxicam Hives  . Mobic [Meloxicam]     Other reaction(s): Hives    Past Medical History:  Diagnosis Date  . Bronchitis   . Diabetes mellitus ([place]) 01/19/2019  . Dyspnea   . Heart murmur    resolved "closed by age 25."   . Hyperlipidemia   . Hypertension   . Seasonal allergies     Past Surgical History:  Procedure Laterality Date  . ABDOMINAL HYSTERECTOMY  2000  . ANTERIOR CERVICAL DECOMP/DISCECTOMY FUSION  03/19/2012   Procedure: ANTERIOR CERVICAL DECOMPRESSION/DISCECTOMY FUSION 1 LEVEL;  Surgeon: Melina Schools, MD;  Location: [facility];  Service: Orthopedics;  Laterality: Left;  Total Disc Replacement C4-5  . ANTERIOR CERVICAL DECOMP/DISCECTOMY FUSION N/A 05/07/2012   Procedure: ANTERIOR CERVICAL DECOMPRESSION/DISCECTOMY FUSION 1 LEVEL/HARDWARE REMOVAL;  Surgeon: Melina Schools, MD;  Location: [facility];  Service: Orthopedics;  Laterality: N/A;  REMOVAL OF CERVICAL DISC REPLACEMENT AND ACDF C4-5  . BACK SURGERY    . CARPAL TUNNEL RELEASE Right 09/28/2018   Procedure: CARPAL TUNNEL RELEASE;  Surgeon: Eustace Moore, MD;  Location: [facility];  Service: Neurosurgery;  Laterality: Right;  CARPAL TUNNEL RELEASE  . CERVICAL FUSION  05/07/2012   Dr Rolena Infante  . COLONOSCOPY    . CYSTECTOMY     L wrist  . POLYPECTOMY    . ROTATOR CUFF REPAIR     Right  . TYMPANOSTOMY TUBE PLACEMENT      Family History: Family History  Problem Relation Age of Onset  . Diabetes Mother   . Hypertension Father   . Pancreatic cancer Sister   . Colon cancer Neg Hx   . Rectal cancer Neg Hx   . Stomach cancer Neg Hx   . Colon polyps Neg Hx   . Esophageal cancer Neg Hx     Social History:   reports that she has been smoking cigarettes. She  has a 15.00 pack-year smoking history. She has never used smokeless tobacco. She reports previous alcohol use. She reports that she does not use drugs.  Medications: Medications Prior to Admission  Medication Sig Dispense Refill  . albuterol (PROVENTIL HFA;VENTOLIN HFA) 108 (90 Base) MCG/ACT inhaler Inhale 1-2 puffs into the lungs every 4 (four) hours as needed for wheezing or shortness of breath (or coughing). 1 Inhaler 1  . ANUCORT-HC 25 MG suppository PLACE 1 SUPPOSITORY RECTALLY ONCE A DAY FOR 1 WEEK AS NEEDED 12 suppository 1  . celecoxib (CELEBREX) 200 MG capsule Take 1 capsule (200 mg total) by mouth 2 (two) times daily as needed. 60 capsule 6  . gabapentin (NEURONTIN) 100 MG capsule 1 PO q HS, may increase to 3 PO q HS if needed/tolerated 90 capsule 3  . LINZESS 290 MCG CAPS capsule Take 1 capsule (290 mcg total) by mouth daily before breakfast. 30 capsule 3  . metFORMIN (GLUCOPHAGE) 500 MG tablet Take 1 tablet (500 mg total) by mouth 2 (two) times daily with a meal. 180 tablet 3  . simvastatin (ZOCOR) 20 MG tablet Take 20 mg by mouth daily.  1  . tiZANidine (ZANAFLEX) 4 MG tablet Take 1 tablet by mouth every 8 (eight) hours as needed for muscle spasms.     Viviana Simpler ELLIPTA 100-62.5-25  MCG/INH AEPB Inhale 1 puff into the lungs daily.    Marland Kitchen tretinoin (RETIN-A) 0.1 % cream APPLY A SMALL AMOUNT TO THE SKIN  EVERY NIGHT    . valsartan-hydrochlorothiazide (DIOVAN-HCT) 160-12.5 MG tablet Take 1 tablet by mouth daily.    . Blood Glucose Monitoring Suppl (CONTOUR NEXT MONITOR) w/Device KIT 1 each by Does not apply route daily. Use to test blood sugars 1-2 times daily. 1 kit 0  . glucose blood (CONTOUR NEXT TEST) test strip Use to test 1-2 times daily. 100 each 12  . Microlet Lancets MISC Use to test blood sugars 1-2 times daily. 100 each 12    Results for orders placed or performed during the hospital encounter of 05/03/19 (from the past 48 hour(s))  Glucose, capillary     Status: None    Collection Time: 05/03/19 11:44 AM  Result Value Ref Range   Glucose-Capillary 99 70 - 99 mg/dL    No results found.   A comprehensive review of systems was negative.  Blood pressure 114/75, pulse 86, temperature (!) 97.1 F (36.2 C), temperature source Oral, resp. rate 18, height '5\' 5"'  (1.651 m), weight 87.5 kg, SpO2 98 %.  General appearance: alert, cooperative and appears stated age Head: Normocephalic, without obvious abnormality, atraumatic Neck: supple, symmetrical, trachea midline Cardio: regular rate and rhythm Resp: clear to auscultation bilaterally Extremities: Intact sensation and capillary refill all digits.  +epl/fpl/io.  No wounds.  Pulses: 2+ and symmetric Skin: Skin color, texture, turgor normal. No rashes or lesions Neurologic: Grossly normal Incision/Wound: none  Assessment/Plan Left long and ring finger trigger digits.  Non operative and operative treatment options have been discussed with the patient and patient wishes to proceed with operative treatment. Risks, benefits, and alternatives of surgery have been discussed and the patient agrees with the plan of care.   Leanora Cover 05/03/2019, 12:22 PM

## 2019-05-03 NOTE — Op Note (Signed)
05/03/2019 [facility]  Operative Note  PREOPERATIVE DIAGNOSIS: long left and ring trigger finger  POSTOPERATIVE DIAGNOSIS:  long left and ring trigger finger  PROCEDURE: Procedure(s): 1. LEFT LONG FINGER TRIGGER RELEASE  2. LEFT RING FINGER TRIGGER RELEASE  SURGEON:  Leanora Cover, MD  ASSISTANT:  none.  ANESTHESIA:  Bier block with sedation.  IV FLUIDS:  Per anesthesia flow sheet.  ESTIMATED BLOOD LOSS:  Minimal.  COMPLICATIONS:  None.  SPECIMENS:  None.  TOURNIQUET TIME:  Total Tourniquet Time Documented: Forearm (Left) - 26 minutes Total: Forearm (Left) - 26 minutes   DISPOSITION:  Stable to PACU.  LOCATION:  SURGERY CENTER  INDICATIONS: Alisha Espinoza is a 54 y.o. female with triggering left long and ring fingers.  These have been injected without resolution.  She wishes to have left long and ring finger trigger release.  Risks, benefits and alternatives of surgery were discussed including the risk of blood loss, infection, damage to nerves, vessels, tendons, ligaments, bone, failure of surgery, need for additional surgery, complications with wound healing, continued pain, continued triggering and need for repeat surgery.  She voiced understanding of these risks and elected to proceed.  OPERATIVE COURSE:  After being identified preoperatively by myself, the patient and I agreed upon the procedure and site of procedure.  The surgical site was marked. The risks, benefits, and alternatives of surgery were reviewed and she wished to proceed.  Surgical consent had been signed. She was given IV Ancef as preoperative antibiotic prophylaxis. She was transported to the operating room and placed on the operating room table in supine position with the Left upper extremity on an arm board. Bier block anesthesia was induced by the anesthesiologist.  The Left upper extremity was prepped and draped in normal sterile orthopedic fashion. A surgical pause was performed  between surgeons, anesthesia, and operating room staff, and all were in agreement as to the patient, procedure, and site of procedure.  Tourniquet at the proximal aspect of the forearm had been inflated for the Bier block.  An incision was made at the volar aspect of the MP joint of the long finger.  This was carried into the subcutaneous tissues by preading technique.  Bipolar electrocautery was used to obtain hemostasis.  The radial and ulnar digital nerves were protected throughout the case. The flexor sheath was identified.  The A1 pulley was identified and sharply incised.  It was released in its entirety.  The proximal 1-2 mm of the A2 pulley was vented to allow better excursion of the tendons.  The finger was placed through a range of motion and there was noted to be no catching.  The tendons were brought through the wound and any adherences released.  An incision was then made at the volar aspect of the MP joint of the ring finger.  This was carried into the subcutaneous tissues by preading technique.  Bipolar electrocautery was used to obtain hemostasis.  The radial and ulnar digital nerves were protected throughout the case. The flexor sheath was identified.  The A1 pulley was identified and sharply incised.  It was released in its entirety.  The proximal 1-2 mm of the A2 pulley was vented to allow better excursion of the tendons.  The finger was placed through a range of motion and there was noted to be no catching.  The tendons were brought through the wound and any adherences released.  The wounds were then copiously irrigated with sterile saline. They were closed  with 4-0 nylon in a horizontal mattress fashion.  They were injected with 0.25% plain Marcaine to aid in postoperative analgesia.  They were dressed with sterile Xeroform, 4x4s, and wrapped lightly with a Coban dressing.  Tourniquet was deflated at 26 minutes.  The fingertips were pink with brisk capillary refill after deflation of the  tourniquet.  The operative drapes were broken down and the patient was awoken from anesthesia safely.  She was transferred back to the stretcher and taken to the PACU in stable condition.   I will see her back in the office in 1 week for postoperative followup.  I will give her a prescription for Norco 5/325 1-2 tabs PO q6 hours prn pain, dispense # 20.    Leanora Cover, MD Electronically signed, 05/03/19

## 2019-05-03 NOTE — Transfer of Care (Signed)
Immediate Anesthesia Transfer of Care Note  Patient: Alisha Espinoza  Procedure(s) Performed: LEFT LONG AND RING FINGER RELEASE TRIGGER FINGER/A-1 PULLEY (Left Hand)  Patient Location: PACU  Anesthesia Type:MAC  Level of Consciousness: drowsy and patient cooperative  Airway & Oxygen Therapy: Patient Spontanous Breathing and Patient connected to face mask oxygen  Post-op Assessment: Report given to RN and Post -op Vital signs reviewed and stable  Post vital signs: Reviewed and stable  Last Vitals:  Vitals Value Taken Time  BP 121/80 05/03/19 1402  Temp    Pulse 76 05/03/19 1403  Resp 18 05/03/19 1403  SpO2 100 % 05/03/19 1403  Vitals shown include unvalidated device data.  Last Pain:  Vitals:   05/03/19 1117  TempSrc: Oral  PainSc: 5       Patients Stated Pain Goal: 5 (13/68/59 9234)  Complications: No apparent anesthesia complications

## 2019-05-03 NOTE — Discharge Instructions (Addendum)

## 2019-05-04 ENCOUNTER — Encounter: Payer: Self-pay | Admitting: *Deleted

## 2019-05-06 ENCOUNTER — Ambulatory Visit: Payer: BC Managed Care – PPO | Admitting: Family Medicine

## 2019-05-08 ENCOUNTER — Encounter (HOSPITAL_COMMUNITY): Payer: Self-pay | Admitting: Emergency Medicine

## 2019-05-08 ENCOUNTER — Other Ambulatory Visit: Payer: Self-pay

## 2019-05-08 ENCOUNTER — Emergency Department (HOSPITAL_COMMUNITY)
Admission: EM | Admit: 2019-05-08 | Discharge: 2019-05-08 | Disposition: A | Discharge status: Home / Self Care | Payer: Medicaid Other | Attending: Emergency Medicine | Admitting: Emergency Medicine

## 2019-05-08 ENCOUNTER — Emergency Department (HOSPITAL_COMMUNITY): Payer: Medicaid Other

## 2019-05-08 DIAGNOSIS — R05 Cough: Secondary | ICD-10-CM | POA: Diagnosis present

## 2019-05-08 DIAGNOSIS — Z79899 Other long term (current) drug therapy: Secondary | ICD-10-CM | POA: Insufficient documentation

## 2019-05-08 DIAGNOSIS — F17210 Nicotine dependence, cigarettes, uncomplicated: Secondary | ICD-10-CM | POA: Insufficient documentation

## 2019-05-08 DIAGNOSIS — J189 Pneumonia, unspecified organism: Secondary | ICD-10-CM | POA: Insufficient documentation

## 2019-05-08 DIAGNOSIS — Z7984 Long term (current) use of oral hypoglycemic drugs: Secondary | ICD-10-CM | POA: Insufficient documentation

## 2019-05-08 DIAGNOSIS — I10 Essential (primary) hypertension: Secondary | ICD-10-CM | POA: Diagnosis not present

## 2019-05-08 DIAGNOSIS — E119 Type 2 diabetes mellitus without complications: Secondary | ICD-10-CM | POA: Diagnosis not present

## 2019-05-08 MED ORDER — DEXAMETHASONE 6 MG PO TABS: 6.0000 mg | ORAL_TABLET | Freq: Every day | ORAL | 0 refills | Status: DC

## 2019-05-08 MED ORDER — DEXAMETHASONE 4 MG PO TABS
12.0000 mg | ORAL_TABLET | Freq: Once | ORAL | Status: AC
  Administered 2019-05-08: 12 mg via ORAL
  Filled 2019-05-08: qty 3

## 2019-05-08 MED ORDER — AMOXICILLIN 500 MG PO CAPS
1000.0000 mg | ORAL_CAPSULE | Freq: Once | ORAL | Status: AC
  Administered 2019-05-08: 1000 mg via ORAL
  Filled 2019-05-08: qty 2

## 2019-05-08 MED ORDER — ALBUTEROL SULFATE HFA 108 (90 BASE) MCG/ACT IN AERS
4.0000 | INHALATION_SPRAY | Freq: Once | RESPIRATORY_TRACT | Status: AC
  Administered 2019-05-08: 21:00:00 4 via RESPIRATORY_TRACT
  Filled 2019-05-08: qty 6.7

## 2019-05-08 MED ORDER — AZITHROMYCIN 250 MG PO TABS: 250.0000 mg | ORAL_TABLET | Freq: Every day | ORAL | 0 refills | Status: DC

## 2019-05-08 MED ORDER — AZITHROMYCIN 250 MG PO TABS
500.0000 mg | ORAL_TABLET | Freq: Once | ORAL | Status: AC
  Administered 2019-05-08: 21:00:00 500 mg via ORAL
  Filled 2019-05-08: qty 2

## 2019-05-08 NOTE — ED Triage Notes (Addendum)
Patient reports cough and congestion x4 days. Reports procedure at outpatient clinic on 2/1. Denies chest pain and SOB. Reports hx bronchitis. Speaking in full sentences without difficulty. States negative Covid test on 1/29 prior to procedure.

## 2019-05-11 ENCOUNTER — Ambulatory Visit: Payer: Medicaid Other

## 2019-05-11 NOTE — ED Provider Notes (Signed)
[facility] DEPT Provider Note   CSN: [id number] Arrival date & time: 05/08/19  1936     History Chief Complaint  Patient presents with  . Cough    Alisha Espinoza is a 54 y.o. female.  HPI   54 year old female with cough and congestion.  Onset about 4 days ago.  Persistent since then.  Reports recent negative Covid testing preop.  No symptoms until recently.  No fevers or chills.  Denies acute GI complaints.  No sick contacts that she is aware of.  Past Medical History:  Diagnosis Date  . Bronchitis   . Diabetes mellitus ([place]) 01/19/2019  . Dyspnea   . Heart murmur    resolved "closed by age 20."   . Hyperlipidemia   . Hypertension   . Seasonal allergies     Patient Active Problem List   Diagnosis Date Noted  . 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 Hemorrhoids 01/21/2019  . Essential hypertension 01/21/2019  . Serum calcium elevated 01/21/2019  . Elevated cholesterol 01/19/2019  . Diabetes mellitus ([place]) 01/19/2019  . 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 Acute bronchitis 08/08/2015  . Leukocytosis 08/08/2015  . Hyperglycemia 08/08/2015  . Tobacco abuse 08/08/2015  . 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 BACTERIAL PNEUMONIA, RIGHT LOWER LOBE 04/27/2008  . LUMP OR MASS IN BREAST 04/27/2008  . COUGH 04/27/2008    Past Surgical History:  Procedure Laterality Date  . ABDOMINAL HYSTERECTOMY  2000  . ANTERIOR CERVICAL DECOMP/DISCECTOMY FUSION  03/19/2012   Procedure: ANTERIOR CERVICAL DECOMPRESSION/DISCECTOMY FUSION 1 LEVEL;  Surgeon: Melina Schools, MD;  Location: [facility];  Service: Orthopedics;  Laterality: Left;  Total Disc Replacement C4-5  . ANTERIOR CERVICAL DECOMP/DISCECTOMY FUSION N/A 05/07/2012   Procedure: ANTERIOR CERVICAL DECOMPRESSION/DISCECTOMY FUSION 1 LEVEL/HARDWARE REMOVAL;  Surgeon: Melina Schools, MD;  Location: [facility];  Service: Orthopedics;  Laterality: N/A;  REMOVAL OF CERVICAL DISC REPLACEMENT AND ACDF C4-5  . BACK SURGERY     . CARPAL TUNNEL RELEASE Right 09/28/2018   Procedure: CARPAL TUNNEL RELEASE;  Surgeon: Eustace Moore, MD;  Location: [facility];  Service: Neurosurgery;  Laterality: Right;  CARPAL TUNNEL RELEASE  . CERVICAL FUSION  05/07/2012   Dr Rolena Infante  . COLONOSCOPY    . CYSTECTOMY     L wrist  . POLYPECTOMY    . ROTATOR CUFF REPAIR     Right  . TRIGGER FINGER RELEASE Left 05/03/2019   Procedure: LEFT LONG AND RING FINGER RELEASE TRIGGER FINGER/A-1 PULLEY;  Surgeon: Leanora Cover, MD;  Location: [facility];  Service: Orthopedics;  Laterality: Left;  beir block  . TYMPANOSTOMY TUBE PLACEMENT       OB History   No obstetric history on file.     Family History  Problem Relation Age of Onset  . Diabetes Mother   . Hypertension Father   . Pancreatic cancer Sister   . Colon cancer Neg Hx   . Rectal cancer Neg Hx   . Stomach cancer Neg Hx   . Colon polyps Neg Hx   . Esophageal cancer Neg Hx     Social History   Tobacco Use  . Smoking status: Current Every Day Smoker    Packs/day: 0.50    Years: 30.00    Pack years: 15.00    Types: Cigarettes  . Smokeless tobacco: Never Used  Substance Use Topics  . Alcohol use: Not Currently  . Drug use: No    Home Medications Prior to Admission medications  Medication Sig Start Date End Date Taking? Authorizing Provider  albuterol (PROVENTIL HFA;VENTOLIN HFA) 108 (90 Base) MCG/ACT inhaler Inhale 1-2 puffs into the lungs every 4 (four) hours as needed for wheezing or shortness of breath (or coughing). 1/63/84   Delora Fuel, MD  ANUCORT-HC 25 MG suppository PLACE 1 SUPPOSITORY RECTALLY ONCE A DAY FOR 1 WEEK AS NEEDED 04/19/19   Mauri Pole, MD  azithromycin (ZITHROMAX) 250 MG tablet Take 1 tablet (250 mg total) by mouth daily. Take first 2 tablets together, then 1 every day until finished. 05/08/19   Virgel Manifold, MD  Blood Glucose Monitoring Suppl (CONTOUR NEXT MONITOR) w/Device KIT 1 each by Does not apply route daily. Use to test  blood sugars 1-2 times daily. 02/02/19   Libby Maw, MD  celecoxib (CELEBREX) 200 MG capsule Take 1 capsule (200 mg total) by mouth 2 (two) times daily as needed. 03/23/19   Hilts, Legrand Como, MD  dexamethasone (DECADRON) 6 MG tablet Take 1 tablet (6 mg total) by mouth daily. 05/08/19   Virgel Manifold, MD  gabapentin (NEURONTIN) 100 MG capsule 1 PO q HS, may increase to 3 PO q HS if needed/tolerated 03/23/19   Hilts, Michael, MD  glucose blood (CONTOUR NEXT TEST) test strip Use to test 1-2 times daily. 02/02/19   Libby Maw, MD  HYDROcodone-acetaminophen [facility]) 5-325 MG tablet 1-2 tabs po q6 hours prn pain 05/03/19   Leanora Cover, MD  LINZESS 290 MCG CAPS capsule Take 1 capsule (290 mcg total) by mouth daily before breakfast. 03/09/19   Mauri Pole, MD  metFORMIN (GLUCOPHAGE) 500 MG tablet Take 1 tablet (500 mg total) by mouth 2 (two) times daily with a meal. 01/21/19   Libby Maw, MD  Microlet Lancets MISC Use to test blood sugars 1-2 times daily. 02/02/19   Libby Maw, MD  simvastatin (ZOCOR) 20 MG tablet Take 20 mg by mouth daily. 01/26/18   [provider]  tiZANidine (ZANAFLEX) 4 MG tablet Take 1 tablet by mouth every 8 (eight) hours as needed for muscle spasms.  01/04/19   [provider]  TRELEGY ELLIPTA 100-62.5-25 MCG/INH AEPB Inhale 1 puff into the lungs daily. 01/04/19   [provider]  tretinoin (RETIN-A) 0.1 % cream APPLY A SMALL AMOUNT TO THE SKIN  EVERY NIGHT 02/17/19   [provider]  valsartan-hydrochlorothiazide (DIOVAN-HCT) 160-12.5 MG tablet Take 1 tablet by mouth daily. 02/08/19   [provider]    Allergies    Meloxicam and Mobic [meloxicam]  Review of Systems   Review of Systems All systems reviewed and negative, other than as noted in HPI.  Physical Exam Updated Vital Signs BP 118/73   Pulse 80   Temp 99 F (37.2 C) (Oral)   Resp 20   SpO2 100%   Physical Exam Vitals and  nursing note reviewed.  Constitutional:      General: She is not in acute distress.    Appearance: She is well-developed.  HENT:     Head: Normocephalic and atraumatic.  Eyes:     General:        Right eye: No discharge.        Left eye: No discharge.     Conjunctiva/sclera: Conjunctivae normal.  Cardiovascular:     Rate and Rhythm: Normal rate and regular rhythm.     Heart sounds: Normal heart sounds. No murmur. No friction rub. No gallop.   Pulmonary:     Effort: Pulmonary effort is normal.  No respiratory distress.     Breath sounds: Normal breath sounds.  Abdominal:     General: There is no distension.     Palpations: Abdomen is soft.     Tenderness: There is no abdominal tenderness.  Musculoskeletal:        General: No tenderness.     Cervical back: Neck supple.     Comments: Lower extremities symmetric as compared to each other. No calf tenderness. Negative Homan's. No palpable cords.   Skin:    General: Skin is warm and dry.  Neurological:     Mental Status: She is alert.  Psychiatric:        Behavior: Behavior normal.        Thought Content: Thought content normal.     ED Results / Procedures / Treatments   Labs (all labs ordered are listed, but only abnormal results are displayed) Labs Reviewed - No data to display  EKG None  Radiology No results found.   DG Chest Portable 1 View  Result Date: 05/08/2019 CLINICAL DATA:  Dyspnea and cough EXAM: PORTABLE CHEST 1 VIEW COMPARISON:  September 21, 2018 FINDINGS: The heart size and mediastinal contours are within normal limits. Mildly increased hazy airspace opacity seen at the left lung base. No pleural effusion. The visualized skeletal structures are unremarkable. IMPRESSION: Mildly increased hazy airspace opacity at the left lung base which could be due to atelectasis and/or early infectious etiology. Electronically Signed   By: Prudencio Pair M.D.   On: 05/08/2019 20:35    Procedures Procedures (including critical  care time)  Medications Ordered in ED Medications  dexamethasone (DECADRON) tablet 12 mg (12 mg Oral Given 05/08/19 2031)  albuterol (VENTOLIN HFA) 108 (90 Base) MCG/ACT inhaler 4 puff (4 puffs Inhalation Given 05/08/19 2031)  amoxicillin (AMOXIL) capsule 1,000 mg (1,000 mg Oral Given 05/08/19 2057)  azithromycin (ZITHROMAX) tablet 500 mg (500 mg Oral Given 05/08/19 2057)    ED Course  I have reviewed the triage vital signs and the nursing notes.  Pertinent labs & imaging results that were available during my care of the patient were reviewed by me and considered in my medical decision making (see chart for details).    MDM Rules/Calculators/A&P                      54 year old female with cough and congestion.  Possibly community-acquired pneumonia.  Appears well.  O2 sats fine on room air.  Antibiotics.  Outpatient follow-up.  Return precautions were discussed. Final Clinical Impression(s) / ED Diagnoses Final diagnoses:  Community acquired pneumonia of right lung, unspecified part of lung    Rx / DC Orders ED Discharge Orders         Ordered    azithromycin (ZITHROMAX) 250 MG tablet  Daily     05/08/19 2058    dexamethasone (DECADRON) 6 MG tablet  Daily     05/08/19 2105           Virgel Manifold, MD 05/11/19 [id number]

## 2019-05-25 ENCOUNTER — Telehealth: Payer: Self-pay | Admitting: Family Medicine

## 2019-05-25 NOTE — Telephone Encounter (Signed)
Patient called checking on status of [place] form. I advised [employer] has fom,they need auth and once they get Josem Kaufmann they will send invoice for payment to [facility]. Once they get both,they will complete forms.

## 2019-05-26 ENCOUNTER — Other Ambulatory Visit: Payer: Self-pay

## 2019-05-26 ENCOUNTER — Ambulatory Visit (INDEPENDENT_AMBULATORY_CARE_PROVIDER_SITE_OTHER): Payer: Medicaid Other

## 2019-05-26 ENCOUNTER — Other Ambulatory Visit: Payer: Self-pay | Admitting: Podiatry

## 2019-05-26 ENCOUNTER — Ambulatory Visit: Payer: Medicaid Other | Admitting: Podiatry

## 2019-05-26 DIAGNOSIS — M778 Other enthesopathies, not elsewhere classified: Secondary | ICD-10-CM

## 2019-05-26 DIAGNOSIS — D492 Neoplasm of unspecified behavior of bone, soft tissue, and skin: Secondary | ICD-10-CM

## 2019-05-26 DIAGNOSIS — M7751 Other enthesopathy of right foot: Secondary | ICD-10-CM | POA: Diagnosis not present

## 2019-05-26 DIAGNOSIS — M79671 Pain in right foot: Secondary | ICD-10-CM

## 2019-05-26 DIAGNOSIS — B070 Plantar wart: Secondary | ICD-10-CM

## 2019-05-27 ENCOUNTER — Ambulatory Visit: Payer: Medicaid Other | Attending: Family Medicine | Admitting: Physical Therapy

## 2019-05-27 ENCOUNTER — Encounter: Payer: Self-pay | Admitting: Physical Therapy

## 2019-05-27 DIAGNOSIS — R293 Abnormal posture: Secondary | ICD-10-CM | POA: Diagnosis present

## 2019-05-27 DIAGNOSIS — M5442 Lumbago with sciatica, left side: Secondary | ICD-10-CM | POA: Diagnosis not present

## 2019-05-27 DIAGNOSIS — G8929 Other chronic pain: Secondary | ICD-10-CM

## 2019-05-27 DIAGNOSIS — M62830 Muscle spasm of back: Secondary | ICD-10-CM

## 2019-05-27 NOTE — Therapy (Signed)
[place], [place], [address] Phone: [phone]   Fax:  [phone]  Physical Therapy Treatment/Re-evaluation  Patient Details  Name: Alisha Espinoza MRN: [id number] Date of Birth: 13-Jun-1965 Referring Provider (PT): Eunice Blase , MD    Encounter Date: 05/27/2019  PT End of Session - 05/27/19 1425    Visit Number  4    Number of Visits  12    Date for PT Re-Evaluation  06/11/19    Authorization Type  MCD-needs new authorization    PT Start Time  1416    PT Stop Time  1455    PT Time Calculation (min)  39 min    Activity Tolerance  Patient limited by pain    Behavior During Therapy  [employer] for tasks assessed/performed       Past Medical History:  Diagnosis Date  . Bronchitis   . Diabetes mellitus ([place]) 01/19/2019  . Dyspnea   . Heart murmur    resolved "closed by age 62."   . Hyperlipidemia   . Hypertension   . Seasonal allergies     Past Surgical History:  Procedure Laterality Date  . ABDOMINAL HYSTERECTOMY  2000  . ANTERIOR CERVICAL DECOMP/DISCECTOMY FUSION  03/19/2012   Procedure: ANTERIOR CERVICAL DECOMPRESSION/DISCECTOMY FUSION 1 LEVEL;  Surgeon: Melina Schools, MD;  Location: [place];  Service: Orthopedics;  Laterality: Left;  Total Disc Replacement C4-5  . ANTERIOR CERVICAL DECOMP/DISCECTOMY FUSION N/A 05/07/2012   Procedure: ANTERIOR CERVICAL DECOMPRESSION/DISCECTOMY FUSION 1 LEVEL/HARDWARE REMOVAL;  Surgeon: Melina Schools, MD;  Location: [place];  Service: Orthopedics;  Laterality: N/A;  REMOVAL OF CERVICAL DISC REPLACEMENT AND ACDF C4-5  . BACK SURGERY    . CARPAL TUNNEL RELEASE Right 09/28/2018   Procedure: CARPAL TUNNEL RELEASE;  Surgeon: Eustace Moore, MD;  Location: [place];  Service: Neurosurgery;  Laterality: Right;  CARPAL TUNNEL RELEASE  . CERVICAL FUSION  05/07/2012   Dr Rolena Infante  . COLONOSCOPY    . CYSTECTOMY     L wrist  . POLYPECTOMY    . ROTATOR CUFF REPAIR     Right  .  TRIGGER FINGER RELEASE Left 05/03/2019   Procedure: LEFT LONG AND RING FINGER RELEASE TRIGGER FINGER/A-1 PULLEY;  Surgeon: Leanora Cover, MD;  Location: [place];  Service: Orthopedics;  Laterality: Left;  beir block  . TYMPANOSTOMY TUBE PLACEMENT      There were no vitals filed for this visit.  Subjective Assessment - 05/27/19 1421    Subjective  Pt. returns, not seen for therapy since 04/28/19 with time missed due to multiple factors including having undergone left trigger finger surgery, recent issues with right foot capsulitis as well as also having recent episode of pneumonia. She continues with moderate LBP symptoms.    Pertinent History  L3-4 L4-5  fusion.       Cervical surgery x 2  one a fusion    Limitations  Standing;Walking;House hold activities    Currently in Pain?  Yes    Pain Score  5     Pain Location  Back    Pain Orientation  Lower    Pain Descriptors / Indicators  Dull;Sharp    Pain Type  Chronic pain    Pain Radiating Towards  bilateral thighs left>right    Pain Onset  More than a month ago    Pain Frequency  Constant    Aggravating Factors   standing and walking    Pain Relieving Factors  ice/heat    Effect of Pain on Daily Activities  limits positional tolerance for standing as well as tolerance for walking         [facility] PT Assessment - 05/27/19 0001      AROM   Lumbar Flexion  90    Lumbar Extension  20    Lumbar - Right Side Bend  28    Lumbar - Left Side Bend  30    Lumbar - Right Rotation  60%    Lumbar - Left Rotation  60%                   [facility] Adult PT Treatment/Exercise - 05/27/19 0001      Lumbar Exercises: Stretches   Passive Hamstring Stretch  Right;Left;3 reps;30 seconds    Single Knee to Chest Stretch  Right;Left;5 reps;10 seconds    Lower Trunk Rotation  5 reps      Lumbar Exercises: Aerobic   Nustep  L4 x 5 min UE/LE      Lumbar Exercises: Supine   Pelvic Tilt  15 reps    Pelvic Tilt Limitations  3-5 sec  holds    Clam  15 reps    Clam Limitations  red Theraband    Bent Knee Raise  10 reps    Bridge  15 reps    Bridge Limitations  partial bridge    Other Supine Lumbar Exercises  supine hip adduction isometric with ball x 15 reps               PT Short Term Goals - 05/27/19 1429      PT SHORT TERM GOAL #1   Title  She will be indpendent with initial hEP    Baseline  met    Time  2    Period  Weeks    Status  Achieved      PT SHORT TERM GOAL #2   Title  She will report less pain with walking in parlking lot at home.    Baseline  reports 50 feet and needs to stop    Time  2    Period  Weeks    Status  On-going        PT Long Term Goals - 05/27/19 1430      PT LONG TERM GOAL #1   Title  She will be indpendent with all HEP issued    Baseline  updates ongoing    Time  6    Period  Weeks    Status  On-going      PT LONG TERM GOAL #2   Title  She will report pain with walking in community  decreased 50% or more with shopping    Baseline  goal ongoing    Time  6    Period  Weeks    Status  On-going      PT LONG TERM GOAL #3   Title  She will report 50% increase in home tasks without pain    Baseline  all home tasks cause some pain    Time  6    Period  Weeks    Status  On-going      PT LONG TERM GOAL #4   Title  She will report understanding of use of cold or heat and stretching to ease any increase pain    Baseline  reports understanding heat/ice, ability to manage with exercises ongoing    Time  6    Period  Weeks    Status  On-going            Plan - 05/27/19 1441    Clinical Impression Statement  Pt. presents for 4th therapy visit after missing several weeks of therapy due to reasons as noted in subjective-progress with therapy impacted/limited due to factors including chronic pain history and medical comorbidities. She continues with back/core weakness and limited activity tolerance due to pain. Plan resume/continue therapy for a few more weeks  for further progress to help relieve pain and improve mobility tolerance.    Personal Factors and Comorbidities  Past/Current Experience;Time since onset of injury/illness/exacerbation;Comorbidity 1;Comorbidity 2    Comorbidities  2 lumbar surgeries, 2 cervical surgeries    Examination-Activity Limitations  Bed Mobility;Carry;Lift;Stand;Locomotion Level    Examination-Participation Restrictions  Community Activity;Laundry;Shop;Cleaning    Stability/Clinical Decision Making  Evolving/Moderate complexity    Clinical Decision Making  Moderate    Rehab Potential  Good    PT Frequency  2x / week    PT Duration  4 weeks    PT Treatment/Interventions  Electrical Stimulation;Iontophoresis 55m/ml Dexamethasone;Moist Heat;Therapeutic exercise;Therapeutic activities;Patient/family education;Passive range of motion;Dry needling;Manual techniques;Taping    PT Next Visit Plan  continue/progress gentle exercises for ROM, core strengthening, gentle stretches, manual as needed, modalities prn for pain    PT Home Exercise Plan  PPT, SKTC, hip bridge, hip adduction isometric, clamshell, hamstring stretch    Consulted and Agree with Plan of Care  Patient       Patient will benefit from skilled therapeutic intervention in order to improve the following deficits and impairments:  Pain, Increased muscle spasms, Decreased strength, Decreased activity tolerance, Difficulty walking  Visit Diagnosis: Chronic bilateral low back pain with left-sided sciatica  Abnormal posture  Muscle spasm of back     Problem List Patient Active Problem List   Diagnosis Date Noted  . 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 Hemorrhoids 01/21/2019  . Essential hypertension 01/21/2019  . Serum calcium elevated 01/21/2019  . Elevated cholesterol 01/19/2019  . Diabetes mellitus ([place] 01/19/2019  . Trigger ring finger of left hand 01/11/2019  . Trigger finger of right thumb 01/11/2019  . 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 Acute bronchitis  08/08/2015  . Leukocytosis 08/08/2015  . Hyperglycemia 08/08/2015  . Tobacco abuse 08/08/2015  . 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 BACTERIAL PNEUMONIA, RIGHT LOWER LOBE 04/27/2008  . LUMP OR MASS IN BREAST 04/27/2008  . COUGH 04/27/2008    CBeaulah Dinning PT, DPT 05/27/19 3:46 PM  [facility] LLC[address]GC[address] [place] [address]Phone: [phone]  Fax:  [phone] Name: SSARABETH BENTONMRN: [id number] of Birth: [birth year]

## 2019-05-28 ENCOUNTER — Ambulatory Visit: Payer: Medicaid Other | Admitting: Podiatry

## 2019-05-31 ENCOUNTER — Telehealth: Payer: Self-pay | Admitting: Family Medicine

## 2019-05-31 NOTE — Telephone Encounter (Signed)
[employer] is in receipt of form. Advised to contact [employer]

## 2019-05-31 NOTE — Telephone Encounter (Signed)
Have you seen anything about requesting records on this patient?

## 2019-05-31 NOTE — Telephone Encounter (Signed)
Alisha Espinoza from [employer].   They are handling the patient's long term disability claim. She said they faxed a request for the appointment notes from the last two visits as well as an attending physician statement form. She has not heard back yet and is sending it again.    Fax: [phone]  Call back: [phone]

## 2019-06-08 NOTE — Progress Notes (Signed)
   Subjective: 54 y.o. female presenting today as a new patient with a chief complaint of right dorsal foot pain that began one week ago. She states the pain has gotten better since onset but has not resolved. She reports some associated swelling. She denies modifying factors and has not done anything for treatment.  She also notes a painful lesion to the plantar left foot that appeared a few weeks ago. Wearing shoes increases the pain. She has not had any treatment for the complaint. Patient is here for further evaluation and treatment.    Past Medical History:  Diagnosis Date  . Bronchitis   . Diabetes mellitus ([place]) 01/19/2019  . Dyspnea   . Heart murmur    resolved "closed by age 17."   . Hyperlipidemia   . Hypertension   . Seasonal allergies     Objective: Physical Exam General: The patient is alert and oriented x3 in no acute distress.   Dermatology: Hyperkeratotic skin lesion(s) noted to the plantar aspect of the left foot approximately 1 cm in diameter. Pinpoint bleeding noted upon debridement. Skin is warm, dry and supple bilateral lower extremities. Negative for open lesions or macerations.   Vascular: Palpable pedal pulses bilaterally. No edema or erythema noted. Capillary refill within normal limits.   Neurological: Epicritic and protective threshold grossly intact bilaterally.    Musculoskeletal Exam: Pain on palpation to the noted skin lesion(s) as well as to the right midfoot.  Range of motion within normal limits to all pedal and ankle joints bilateral. Muscle strength 5/5 in all groups bilateral.   Radiographic Exam:  Normal osseous mineralization. Joint spaces preserved. No fracture/dislocation/boney destruction.     Assessment: #1 plantar wart left sub-fourth MPJ #2 right midfoot capsulitis    Plan of Care:  #1 Patient was evaluated. X-Rays reviewed.  #2 Excisional debridement of the plantar wart lesion(s) was performed using a chisel blade. Cantharone was  applied and the lesion(s) was dressed with a dry sterile dressing. #3 Injection of 0.5 mLs Celestone Soluspan injected into the right midfoot.  #4 patient is to return to clinic in 2 weeks  Edrick Kins, DPM [facility]  Dr. Edrick Kins, [address]                                        [place], [place] [address]                Office [phone]  Fax [phone]

## 2019-06-09 ENCOUNTER — Other Ambulatory Visit: Payer: Self-pay

## 2019-06-09 ENCOUNTER — Ambulatory Visit (INDEPENDENT_AMBULATORY_CARE_PROVIDER_SITE_OTHER): Payer: Medicaid Other | Admitting: Podiatry

## 2019-06-09 DIAGNOSIS — D492 Neoplasm of unspecified behavior of bone, soft tissue, and skin: Secondary | ICD-10-CM | POA: Diagnosis not present

## 2019-06-09 DIAGNOSIS — L989 Disorder of the skin and subcutaneous tissue, unspecified: Secondary | ICD-10-CM

## 2019-06-09 DIAGNOSIS — B070 Plantar wart: Secondary | ICD-10-CM

## 2019-06-10 ENCOUNTER — Other Ambulatory Visit: Payer: Self-pay | Admitting: Gastroenterology

## 2019-06-10 DIAGNOSIS — K6289 Other specified diseases of anus and rectum: Secondary | ICD-10-CM

## 2019-06-11 ENCOUNTER — Encounter: Payer: Self-pay | Admitting: Physical Therapy

## 2019-06-11 ENCOUNTER — Ambulatory Visit: Payer: Medicaid Other | Attending: Family Medicine | Admitting: Physical Therapy

## 2019-06-11 ENCOUNTER — Other Ambulatory Visit: Payer: Self-pay

## 2019-06-11 DIAGNOSIS — G8929 Other chronic pain: Secondary | ICD-10-CM

## 2019-06-11 DIAGNOSIS — R293 Abnormal posture: Secondary | ICD-10-CM

## 2019-06-11 DIAGNOSIS — M5442 Lumbago with sciatica, left side: Secondary | ICD-10-CM | POA: Insufficient documentation

## 2019-06-11 DIAGNOSIS — M62830 Muscle spasm of back: Secondary | ICD-10-CM

## 2019-06-11 NOTE — Therapy (Signed)
[place], [place], [address] Phone: [phone]   Fax:  [phone]  Physical Therapy Treatment  Patient Details  Name: Alisha Espinoza MRN: [id number] Date of Birth: 05/17/1965 Referring Provider (PT): Eunice Blase , MD    Encounter Date: 06/11/2019  PT End of Session - 06/11/19 0818    Visit Number  5    Number of Visits  12    Date for PT Re-Evaluation  07/01/19    Authorization Type  MCD-needs new authorization    PT Start Time  0800    PT Stop Time  5909   no charges due to no insurance authorization yet   PT Time Calculation (min)  41 min    Activity Tolerance  Patient limited by pain    Behavior During Therapy  [facility] for tasks assessed/performed       Past Medical History:  Diagnosis Date  . Bronchitis   . Diabetes mellitus ([place]) 01/19/2019  . Dyspnea   . Heart murmur    resolved "closed by age 31."   . Hyperlipidemia   . Hypertension   . Seasonal allergies     Past Surgical History:  Procedure Laterality Date  . ABDOMINAL HYSTERECTOMY  2000  . ANTERIOR CERVICAL DECOMP/DISCECTOMY FUSION  03/19/2012   Procedure: ANTERIOR CERVICAL DECOMPRESSION/DISCECTOMY FUSION 1 LEVEL;  Surgeon: Melina Schools, MD;  Location: [place];  Service: Orthopedics;  Laterality: Left;  Total Disc Replacement C4-5  . ANTERIOR CERVICAL DECOMP/DISCECTOMY FUSION N/A 05/07/2012   Procedure: ANTERIOR CERVICAL DECOMPRESSION/DISCECTOMY FUSION 1 LEVEL/HARDWARE REMOVAL;  Surgeon: Melina Schools, MD;  Location: [place];  Service: Orthopedics;  Laterality: N/A;  REMOVAL OF CERVICAL DISC REPLACEMENT AND ACDF C4-5  . BACK SURGERY    . CARPAL TUNNEL RELEASE Right 09/28/2018   Procedure: CARPAL TUNNEL RELEASE;  Surgeon: Eustace Moore, MD;  Location: [place];  Service: Neurosurgery;  Laterality: Right;  CARPAL TUNNEL RELEASE  . CERVICAL FUSION  05/07/2012   Dr Rolena Infante  . COLONOSCOPY    . CYSTECTOMY     L wrist  . POLYPECTOMY    .  ROTATOR CUFF REPAIR     Right  . TRIGGER FINGER RELEASE Left 05/03/2019   Procedure: LEFT LONG AND RING FINGER RELEASE TRIGGER FINGER/A-1 PULLEY;  Surgeon: Leanora Cover, MD;  Location: [place];  Service: Orthopedics;  Laterality: Left;  beir block  . TYMPANOSTOMY TUBE PLACEMENT      There were no vitals filed for this visit.  Subjective Assessment - 06/11/19 0802    Subjective  Pt. reports back about the same as last session. "Hurting all over" this AM including diffuse thigh and hand pain symptoms.    Currently in Pain?  Yes    Pain Score  5     Pain Location  Back    Pain Orientation  Lower    Pain Descriptors / Indicators  Dull;Sharp    Pain Type  Chronic pain    Pain Onset  More than a month ago    Pain Frequency  Constant    Aggravating Factors   standing and walking    Pain Relieving Factors  ice/heat    Effect of Pain on Daily Activities  limits positional and activity tolerance                       [facility] Adult PT Treatment/Exercise - 06/11/19 0001      Lumbar Exercises: Stretches  Passive Hamstring Stretch  Right;Left;3 reps;30 seconds    Single Knee to Chest Stretch  Right;Left;3 reps;20 seconds    Lower Trunk Rotation  5 reps    Pelvic Tilt  15 reps    Piriformis Stretch  Right;Left;3 reps;30 seconds      Lumbar Exercises: Supine   Pelvic Tilt Limitations  see under stretches    Clam  15 reps    Clam Limitations  red Theraband    Bent Knee Raise  10 reps    Bridge Limitations  glut set to partial bridge initiation-stopped after 5 reps due to pain/difficulty tolerating    Other Supine Lumbar Exercises  supine hip adduction isometric with ball x 15 reps      Manual Therapy   Soft tissue mobilization  STM in left sidelying right lumbar paraspinals, IASTM/foam roll right posterolateral hip region             PT Education - 06/11/19 0836    Education Details  exercises, pain education    Person(s) Educated  Patient     Methods  Explanation;Demonstration;Verbal cues    Comprehension  Verbalized understanding;Returned demonstration       PT Short Term Goals - 05/27/19 1429      PT SHORT TERM GOAL #1   Title  She will be indpendent with initial hEP    Baseline  met    Time  2    Period  Weeks    Status  Achieved      PT SHORT TERM GOAL #2   Title  She will report less pain with walking in parlking lot at home.    Baseline  reports 50 feet and needs to stop    Time  2    Period  Weeks    Status  On-going        PT Long Term Goals - 05/27/19 1430      PT LONG TERM GOAL #1   Title  She will be indpendent with all HEP issued    Baseline  updates ongoing    Time  6    Period  Weeks    Status  On-going      PT LONG TERM GOAL #2   Title  She will report pain with walking in community  decreased 50% or more with shopping    Baseline  goal ongoing    Time  6    Period  Weeks    Status  On-going      PT LONG TERM GOAL #3   Title  She will report 50% increase in home tasks without pain    Baseline  all home tasks cause some pain    Time  6    Period  Weeks    Status  On-going      PT LONG TERM GOAL #4   Title  She will report understanding of use of cold or heat and stretching to ease any increase pain    Baseline  reports understanding heat/ice, ability to manage with exercises ongoing    Time  6    Period  Weeks    Status  On-going            Plan - 06/11/19 1856    Clinical Impression Statement  Fair tx. tolerance due to soreness/pain and general stiffness with ROM exercises. Cues for maintaining posterior pelvic tilt with hooklying exercises. Limited tolerance also to manual techniques due to muscle tenderness. Fair progress/status re: therapy goals  with ongoing chronic pain.    Personal Factors and Comorbidities  Past/Current Experience;Time since onset of injury/illness/exacerbation;Comorbidity 1;Comorbidity 2    Comorbidities  2 lumbar surgeries, 2 cervical surgeries     Examination-Activity Limitations  Bed Mobility;Carry;Lift;Stand;Locomotion Level    Examination-Participation Restrictions  Community Activity;Laundry;Shop;Cleaning    Stability/Clinical Decision Making  Evolving/Moderate complexity    Clinical Decision Making  Moderate    Rehab Potential  Good    PT Frequency  2x / week    PT Duration  4 weeks    PT Treatment/Interventions  Electrical Stimulation;Iontophoresis 57m/ml Dexamethasone;Moist Heat;Therapeutic exercise;Therapeutic activities;Patient/family education;Passive range of motion;Dry needling;Manual techniques;Taping    PT Next Visit Plan  continue/progress gentle exercises for ROM, core strengthening, gentle stretches, manual as needed, modalities prn for pain    PT Home Exercise Plan  PPT, SKTC, hip bridge, hip adduction isometric, clamshell, hamstring stretch    Consulted and Agree with Plan of Care  Patient       Patient will benefit from skilled therapeutic intervention in order to improve the following deficits and impairments:  Pain, Increased muscle spasms, Decreased strength, Decreased activity tolerance, Difficulty walking  Visit Diagnosis: Chronic bilateral low back pain with left-sided sciatica  Abnormal posture  Muscle spasm of back     Problem List Patient Active Problem List   Diagnosis Date Noted  . 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 Hemorrhoids 01/21/2019  . Essential hypertension 01/21/2019  . Serum calcium elevated 01/21/2019  . Elevated cholesterol 01/19/2019  . Diabetes mellitus ([place] 01/19/2019  . Trigger ring finger of left hand 01/11/2019  . Trigger finger of right thumb 01/11/2019  . 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 Acute bronchitis 08/08/2015  . Leukocytosis 08/08/2015  . Hyperglycemia 08/08/2015  . Tobacco abuse 08/08/2015  . 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 BACTERIAL PNEUMONIA, RIGHT LOWER LOBE 04/27/2008  . LUMP OR MASS IN BREAST 04/27/2008  . COUGH 04/27/2008    CBeaulah Dinning PT, DPT 06/11/19 8:42 AM  [facility] [place] [address]Phone: [phone]  Fax:  [phone] Name: Alisha RIDOLFIMRN: [id number] of Birth: [birth year]

## 2019-06-13 NOTE — Progress Notes (Signed)
   Subjective: 54 y.o. female presenting today for follow up evaluation of a plantar wart noted to the left sub-fourth MPJ. She states the lesion has not improved and the pain is still the same. She has not done anything for treatment at home. Walking and bearing weight increases the symptoms. Patient is here for further evaluation and treatment.    Past Medical History:  Diagnosis Date  . Bronchitis   . Diabetes mellitus ([place]) 01/19/2019  . Dyspnea   . Heart murmur    resolved "closed by age 3."   . Hyperlipidemia   . Hypertension   . Seasonal allergies     Objective: Physical Exam General: The patient is alert and oriented x3 in no acute distress.   Dermatology: Hyperkeratotic skin lesion(s) noted to the plantar aspect of the left foot approximately 1 cm in diameter. Pinpoint bleeding noted upon debridement. Skin is warm, dry and supple bilateral lower extremities. Negative for open lesions or macerations.   Vascular: Palpable pedal pulses bilaterally. No edema or erythema noted. Capillary refill within normal limits.   Neurological: Epicritic and protective threshold grossly intact bilaterally.    Musculoskeletal Exam: Pain on palpation to the noted skin lesion(s).  Range of motion within normal limits to all pedal and ankle joints bilateral. Muscle strength 5/5 in all groups bilateral.   Assessment: #1 plantar wart left sub-fourth MPJ #2 Porokeratosis left fifth toe    Plan of Care:  #1 Patient was evaluated.   #2 Excisional debridement of the plantar wart lesion(s) was performed using a chisel blade. Salinocaine was applied and the lesion(s) was dressed with a dry sterile dressing. #3 Salinocaine provided to patient.  #4 patient is to return to clinic as needed.   Edrick Kins, DPM [facility]  Dr. Edrick Kins, [address]                                        [place], [place] [address]                Office [phone]  Fax [phone]

## 2019-06-16 ENCOUNTER — Other Ambulatory Visit: Payer: Self-pay

## 2019-06-16 ENCOUNTER — Ambulatory Visit: Payer: Medicaid Other | Admitting: Physical Therapy

## 2019-06-16 ENCOUNTER — Encounter: Payer: Self-pay | Admitting: Physical Therapy

## 2019-06-16 DIAGNOSIS — R293 Abnormal posture: Secondary | ICD-10-CM

## 2019-06-16 DIAGNOSIS — M62830 Muscle spasm of back: Secondary | ICD-10-CM | POA: Diagnosis present

## 2019-06-16 DIAGNOSIS — G8929 Other chronic pain: Secondary | ICD-10-CM

## 2019-06-16 DIAGNOSIS — M5442 Lumbago with sciatica, left side: Secondary | ICD-10-CM | POA: Diagnosis not present

## 2019-06-16 NOTE — Therapy (Signed)
[place], [place], [address] Phone: [phone]   Fax:  [phone]  Physical Therapy Treatment  Patient Details  Name: Alisha Espinoza MRN: [id number] Date of Birth: [birth year] Referring Provider (PT): Eunice Blase , MD    Encounter Date: 06/16/2019  PT End of Session - 06/16/19 1020    Visit Number  6    Number of Visits  12    Date for PT Re-Evaluation  07/01/19    Authorization Type  MCD    Authorization Time Period  06/16/19-07/13/19    Authorization - Visit Number  1    Authorization - Number of Visits  8    PT Start Time  1016    PT Stop Time  1108    PT Time Calculation (min)  52 min    Activity Tolerance  Patient limited by pain    Behavior During Therapy  [facility] for tasks assessed/performed       Past Medical History:  Diagnosis Date  . Bronchitis   . Diabetes mellitus ([place]) 01/19/2019  . Dyspnea   . Heart murmur    resolved "closed by age 56."   . Hyperlipidemia   . Hypertension   . Seasonal allergies     Past Surgical History:  Procedure Laterality Date  . ABDOMINAL HYSTERECTOMY  2000  . ANTERIOR CERVICAL DECOMP/DISCECTOMY FUSION  03/19/2012   Procedure: ANTERIOR CERVICAL DECOMPRESSION/DISCECTOMY FUSION 1 LEVEL;  Surgeon: Melina Schools, MD;  Location: [place];  Service: Orthopedics;  Laterality: Left;  Total Disc Replacement C4-5  . ANTERIOR CERVICAL DECOMP/DISCECTOMY FUSION N/A 05/07/2012   Procedure: ANTERIOR CERVICAL DECOMPRESSION/DISCECTOMY FUSION 1 LEVEL/HARDWARE REMOVAL;  Surgeon: Melina Schools, MD;  Location: [place];  Service: Orthopedics;  Laterality: N/A;  REMOVAL OF CERVICAL DISC REPLACEMENT AND ACDF C4-5  . BACK SURGERY    . CARPAL TUNNEL RELEASE Right 09/28/2018   Procedure: CARPAL TUNNEL RELEASE;  Surgeon: Eustace Moore, MD;  Location: [place];  Service: Neurosurgery;  Laterality: Right;  CARPAL TUNNEL RELEASE  . CERVICAL FUSION  05/07/2012   Dr Rolena Infante  . COLONOSCOPY    .  CYSTECTOMY     L wrist  . POLYPECTOMY    . ROTATOR CUFF REPAIR     Right  . TRIGGER FINGER RELEASE Left 05/03/2019   Procedure: LEFT LONG AND RING FINGER RELEASE TRIGGER FINGER/A-1 PULLEY;  Surgeon: Leanora Cover, MD;  Location: [place];  Service: Orthopedics;  Laterality: Left;  beir block  . TYMPANOSTOMY TUBE PLACEMENT      There were no vitals filed for this visit.  Subjective Assessment - 06/16/19 1019    Subjective  No new complaints or concerns this AM. Pain comes and goes. Moderate LBP today.    Pertinent History  L3-4 L4-5  fusion.       Cervical surgery x 2  one a fusion    Currently in Pain?  Yes    Pain Score  5     Pain Location  Back    Pain Orientation  Lower    Pain Descriptors / Indicators  Dull;Sharp    Pain Type  Chronic pain    Pain Onset  More than a month ago    Pain Frequency  Constant    Aggravating Factors   standing and walking    Pain Relieving Factors  ice/heat    Effect of Pain on Daily Activities  limits positional + activity tolerance  [facility] Adult PT Treatment/Exercise - 06/16/19 0001      Lumbar Exercises: Stretches   Passive Hamstring Stretch  Right;Left;2 reps;30 seconds    Single Knee to Chest Stretch  Right;Left;5 reps;10 seconds    Single Knee to Chest Stretch Limitations  one side at a time with leg on 55 cm P-ball    Lower Trunk Rotation  5 reps    Pelvic Tilt  15 reps    Piriformis Stretch  Right;Left;2 reps;30 seconds      Lumbar Exercises: Aerobic   Nustep  L5 x 5 min UE/LE      Lumbar Exercises: Standing   Functional Squats Limitations  mini-squat at counter x 10 reps   cues to avoid knee flexion past toes   Row  AROM;Strengthening;Both;15 reps    Theraband Level (Row)  Level 3 (Green)    Row Limitations  on Airex    Shoulder Extension  AROM;Strengthening;Both;15 reps    Theraband Level (Shoulder Extension)  Level 3 (Green)    Shoulder Extension Limitations  on Airex     Other Standing Lumbar Exercises  hip abd SLR-left x 15 reps, started forst 5 reps on right but held due to hip pain      Lumbar Exercises: Supine   Pelvic Tilt Limitations  see under stretches    Clam  15 reps    Clam Limitations  red Theraband    Bent Knee Raise  10 reps    Bridge  10 reps    Bridge Limitations  partial bridge    Other Supine Lumbar Exercises  supine hip adduction isometric with ball x 15 reps      Moist Heat Therapy   Number Minutes Moist Heat  10 Minutes    Moist Heat Location  Lumbar Spine   in sitting     Manual Therapy   Soft tissue mobilization  STM right lumbar paraspinals in left sidelying             PT Education - 06/16/19 1037    Education Details  exercises    Person(s) Educated  Patient    Methods  Explanation;Demonstration;Verbal cues    Comprehension  Verbalized understanding;Returned demonstration;Verbal cues required       PT Short Term Goals - 05/27/19 1429      PT SHORT TERM GOAL #1   Title  She will be indpendent with initial hEP    Baseline  met    Time  2    Period  Weeks    Status  Achieved      PT SHORT TERM GOAL #2   Title  She will report less pain with walking in parlking lot at home.    Baseline  reports 50 feet and needs to stop    Time  2    Period  Weeks    Status  On-going        PT Long Term Goals - 05/27/19 1430      PT LONG TERM GOAL #1   Title  She will be indpendent with all HEP issued    Baseline  updates ongoing    Time  6    Period  Weeks    Status  On-going      PT LONG TERM GOAL #2   Title  She will report pain with walking in community  decreased 50% or more with shopping    Baseline  goal ongoing    Time  6    Period  Weeks    Status  On-going      PT LONG TERM GOAL #3   Title  She will report 50% increase in home tasks without pain    Baseline  all home tasks cause some pain    Time  6    Period  Weeks    Status  On-going      PT LONG TERM GOAL #4   Title  She will report  understanding of use of cold or heat and stretching to ease any increase pain    Baseline  reports understanding heat/ice, ability to manage with exercises ongoing    Time  6    Period  Weeks    Status  On-going            Plan - 06/16/19 1037    Clinical Impression Statement  Added some standing work on core and LE strengthening with fair tolerance due to hip/back and hand pain (limiting ability to grip Theraband handles). Fair tx. tolerance and progress with generalized pain with activity/exercises.    Personal Factors and Comorbidities  Past/Current Experience;Time since onset of injury/illness/exacerbation;Comorbidity 1;Comorbidity 2    Comorbidities  2 lumbar surgeries, 2 cervical surgeries    Examination-Activity Limitations  Bed Mobility;Carry;Lift;Stand;Locomotion Level    Examination-Participation Restrictions  Community Activity;Laundry;Shop;Cleaning    Stability/Clinical Decision Making  Evolving/Moderate complexity    Clinical Decision Making  Moderate    Rehab Potential  Good    PT Frequency  2x / week    PT Duration  4 weeks    PT Treatment/Interventions  Electrical Stimulation;Iontophoresis 6m/ml Dexamethasone;Moist Heat;Therapeutic exercise;Therapeutic activities;Patient/family education;Passive range of motion;Dry needling;Manual techniques;Taping    PT Next Visit Plan  continue/progress gentle exercises for ROM, core strengthening, gentle stretches, manual as needed, modalities prn for pain    PT Home Exercise Plan  PPT, SKTC, hip bridge, hip adduction isometric, clamshell, hamstring stretch    Consulted and Agree with Plan of Care  Patient       Patient will benefit from skilled therapeutic intervention in order to improve the following deficits and impairments:  Pain, Increased muscle spasms, Decreased strength, Decreased activity tolerance, Difficulty walking  Visit Diagnosis: Chronic bilateral low back pain with left-sided sciatica  Abnormal  posture  Muscle spasm of back     Problem List Patient Active Problem List   Diagnosis Date Noted  . 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 Hemorrhoids 01/21/2019  . Essential hypertension 01/21/2019  . Serum calcium elevated 01/21/2019  . Elevated cholesterol 01/19/2019  . Diabetes mellitus ([place] 01/19/2019  . Trigger ring finger of left hand 01/11/2019  . Trigger finger of right thumb 01/11/2019  . 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 Acute bronchitis 08/08/2015  . Leukocytosis 08/08/2015  . Hyperglycemia 08/08/2015  . Tobacco abuse 08/08/2015  . 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 BACTERIAL PNEUMONIA, RIGHT LOWER LOBE 04/27/2008  . LUMP OR MASS IN BREAST 04/27/2008  . COUGH 04/27/2008   CBeaulah Dinning PT, DPT 06/16/19 10:58 AM  [facility]GDevon [place] [address]Phone: [phone]  Fax:  [phone] Name: SSTEPHONIE WILCOXENMRN: [id number] of Birth: 09/06/1965/07/15

## 2019-06-18 ENCOUNTER — Ambulatory Visit: Payer: Medicaid Other | Admitting: Physical Therapy

## 2019-06-18 ENCOUNTER — Encounter: Payer: Self-pay | Admitting: Physical Therapy

## 2019-06-18 ENCOUNTER — Other Ambulatory Visit: Payer: Self-pay

## 2019-06-18 DIAGNOSIS — G8929 Other chronic pain: Secondary | ICD-10-CM

## 2019-06-18 DIAGNOSIS — M5442 Lumbago with sciatica, left side: Secondary | ICD-10-CM | POA: Diagnosis not present

## 2019-06-18 DIAGNOSIS — M62830 Muscle spasm of back: Secondary | ICD-10-CM

## 2019-06-18 DIAGNOSIS — R293 Abnormal posture: Secondary | ICD-10-CM

## 2019-06-18 NOTE — Therapy (Signed)
[place], [place], [address] Phone: [phone]   Fax:  [phone]  Physical Therapy Treatment  Patient Details  Name: Alisha Espinoza MRN: [id number] Date of Birth: [birth year] Referring Provider (PT): Eunice Blase , MD    Encounter Date: 06/18/2019  PT End of Session - 06/18/19 1216    Visit Number  7    Number of Visits  12    Date for PT Re-Evaluation  07/01/19    Authorization Type  MCD    Authorization Time Period  06/16/19-07/13/19    Authorization - Visit Number  2    Authorization - Number of Visits  8    PT Start Time  1146    PT Stop Time  1240    PT Time Calculation (min)  54 min    Activity Tolerance  Patient limited by pain    Behavior During Therapy  [facility] for tasks assessed/performed       Past Medical History:  Diagnosis Date  . Bronchitis   . Diabetes mellitus ([place]) 01/19/2019  . Dyspnea   . Heart murmur    resolved "closed by age 58."   . Hyperlipidemia   . Hypertension   . Seasonal allergies     Past Surgical History:  Procedure Laterality Date  . ABDOMINAL HYSTERECTOMY  2000  . ANTERIOR CERVICAL DECOMP/DISCECTOMY FUSION  03/19/2012   Procedure: ANTERIOR CERVICAL DECOMPRESSION/DISCECTOMY FUSION 1 LEVEL;  Surgeon: Melina Schools, MD;  Location: [place];  Service: Orthopedics;  Laterality: Left;  Total Disc Replacement C4-5  . ANTERIOR CERVICAL DECOMP/DISCECTOMY FUSION N/A 05/07/2012   Procedure: ANTERIOR CERVICAL DECOMPRESSION/DISCECTOMY FUSION 1 LEVEL/HARDWARE REMOVAL;  Surgeon: Melina Schools, MD;  Location: [place];  Service: Orthopedics;  Laterality: N/A;  REMOVAL OF CERVICAL DISC REPLACEMENT AND ACDF C4-5  . BACK SURGERY    . CARPAL TUNNEL RELEASE Right 09/28/2018   Procedure: CARPAL TUNNEL RELEASE;  Surgeon: Eustace Moore, MD;  Location: [place];  Service: Neurosurgery;  Laterality: Right;  CARPAL TUNNEL RELEASE  . CERVICAL FUSION  05/07/2012   Dr Rolena Infante  . COLONOSCOPY    .  CYSTECTOMY     L wrist  . POLYPECTOMY    . ROTATOR CUFF REPAIR     Right  . TRIGGER FINGER RELEASE Left 05/03/2019   Procedure: LEFT LONG AND RING FINGER RELEASE TRIGGER FINGER/A-1 PULLEY;  Surgeon: Leanora Cover, MD;  Location: [place];  Service: Orthopedics;  Laterality: Left;  beir block  . TYMPANOSTOMY TUBE PLACEMENT      There were no vitals filed for this visit.  Subjective Assessment - 06/18/19 1149    Subjective  Left side of low back hurting more this AM. "Took a pain pill (tramadol)". No specific exacerbating cause noted.    Pertinent History  L3-4 L4-5  fusion.       Cervical surgery x 2  one a fusion    Currently in Pain?  Yes    Pain Score  5     Pain Location  Back    Pain Orientation  Lower;Left    Pain Descriptors / Indicators  Dull    Pain Type  Chronic pain    Pain Onset  More than a month ago    Pain Frequency  Constant    Aggravating Factors   standing and walking    Pain Relieving Factors  ice/heat    Effect of Pain on Daily Activities  limits positional and activity tolerance  [facility] Adult PT Treatment/Exercise - 06/18/19 0001      Lumbar Exercises: Stretches   Passive Hamstring Stretch  Right;Left;2 reps;30 seconds    Single Knee to Chest Stretch  Right;Left;3 reps;5 reps;20 seconds    Pelvic Tilt  15 reps    Piriformis Stretch  Right;Left;2 reps;30 seconds      Lumbar Exercises: Aerobic   Nustep  L4 x 5 min UE/LE      Lumbar Exercises: Standing   Row  AROM;Strengthening;Both;15 reps    Theraband Level (Row)  Level 3 (Green)    Shoulder Extension  AROM;Strengthening;Both;15 reps    Theraband Level (Shoulder Extension)  Level 3 (Green)      Lumbar Exercises: Supine   Pelvic Tilt Limitations  see under stretches    Clam  15 reps    Clam Limitations  red Theraband    Bent Knee Raise  15 reps    Bent Knee Raise Limitations  with cues posterior pelvic tilt    Bridge  10 reps    Bridge Limitations   legs on reversed incline wedge    Other Supine Lumbar Exercises  supine hip adduction isometric with ball x 15 reps      Moist Heat Therapy   Number Minutes Moist Heat  10 Minutes    Moist Heat Location  Lumbar Spine   in sitting     Manual Therapy   Manual Therapy  Joint mobilization    Joint Mobilization  LAD bilat. hips grade I-III oscillations    Soft tissue mobilization  STM left lumbar paraspinals in right sidelying             PT Education - 06/18/19 1231    Education Details  exercises, pain education    Person(s) Educated  Patient    Methods  Explanation;Demonstration;Verbal cues    Comprehension  Verbalized understanding;Returned demonstration       PT Short Term Goals - 05/27/19 1429      PT SHORT TERM GOAL #1   Title  She will be indpendent with initial hEP    Baseline  met    Time  2    Period  Weeks    Status  Achieved      PT SHORT TERM GOAL #2   Title  She will report less pain with walking in parlking lot at home.    Baseline  reports 50 feet and needs to stop    Time  2    Period  Weeks    Status  On-going        PT Long Term Goals - 05/27/19 1430      PT LONG TERM GOAL #1   Title  She will be indpendent with all HEP issued    Baseline  updates ongoing    Time  6    Period  Weeks    Status  On-going      PT LONG TERM GOAL #2   Title  She will report pain with walking in community  decreased 50% or more with shopping    Baseline  goal ongoing    Time  6    Period  Weeks    Status  On-going      PT LONG TERM GOAL #3   Title  She will report 50% increase in home tasks without pain    Baseline  all home tasks cause some pain    Time  6    Period  Weeks  Status  On-going      PT LONG TERM GOAL #4   Title  She will report understanding of use of cold or heat and stretching to ease any increase pain    Baseline  reports understanding heat/ice, ability to manage with exercises ongoing    Time  6    Period  Weeks    Status   On-going            Plan - 06/18/19 1232    Clinical Impression Statement  Fair status with general pain with exercises in back and hands (modified hand hold for Theraband exercises to minimize) with fair tolerance due to pain. Potential progress will be slow/gradual given chronic pain history and diffuse pain symptoms.    Personal Factors and Comorbidities  Past/Current Experience;Time since onset of injury/illness/exacerbation;Comorbidity 1;Comorbidity 2    Comorbidities  2 lumbar surgeries, 2 cervical surgeries    Examination-Activity Limitations  Bed Mobility;Carry;Lift;Stand;Locomotion Level    Examination-Participation Restrictions  Community Activity;Laundry;Shop;Cleaning    Stability/Clinical Decision Making  Evolving/Moderate complexity    Clinical Decision Making  Moderate    Rehab Potential  Good    PT Frequency  2x / week    PT Duration  4 weeks    PT Treatment/Interventions  Electrical Stimulation;Iontophoresis 4m/ml Dexamethasone;Moist Heat;Therapeutic exercise;Therapeutic activities;Patient/family education;Passive range of motion;Dry needling;Manual techniques;Taping    PT Next Visit Plan  continue/progress gentle exercises for ROM, core strengthening, gentle stretches, manual as needed, modalities prn for pain    PT Home Exercise Plan  PPT, SKTC, hip bridge, hip adduction isometric, clamshell, hamstring stretch    Consulted and Agree with Plan of Care  Patient       Patient will benefit from skilled therapeutic intervention in order to improve the following deficits and impairments:  Pain, Increased muscle spasms, Decreased strength, Decreased activity tolerance, Difficulty walking  Visit Diagnosis: Chronic bilateral low back pain with left-sided sciatica  Abnormal posture  Muscle spasm of back     Problem List Patient Active Problem List   Diagnosis Date Noted  . 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 Hemorrhoids 01/21/2019  . Essential hypertension 01/21/2019  .  Serum calcium elevated 01/21/2019  . Elevated cholesterol 01/19/2019  . Diabetes [place] ([place] 01/19/2019  . Trigger ring finger of left hand 01/11/2019  . Trigger finger of right thumb 01/11/2019  . 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 Acute bronchitis 08/08/2015  . Leukocytosis 08/08/2015  . Hyperglycemia 08/08/2015  . Tobacco abuse 08/08/2015  . 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 BACTERIAL PNEUMONIA, RIGHT LOWER LOBE 04/27/2008  . LUMP OR MASS IN BREAST 04/27/2008  . COUGH 04/27/2008    CBeaulah Dinning PT, DPT 06/18/19 12:35 PM  [facility] [place] [address]Phone: [phone]  Fax:  [phone] Name: Alisha BRUBACHERMRN: [id number] of Birth: 204-23-1967

## 2019-06-23 ENCOUNTER — Encounter: Payer: Self-pay | Admitting: Physical Therapy

## 2019-06-23 ENCOUNTER — Ambulatory Visit: Payer: Medicaid Other | Admitting: Physical Therapy

## 2019-06-23 ENCOUNTER — Other Ambulatory Visit: Payer: Self-pay

## 2019-06-23 DIAGNOSIS — M5442 Lumbago with sciatica, left side: Secondary | ICD-10-CM

## 2019-06-23 DIAGNOSIS — M62830 Muscle spasm of back: Secondary | ICD-10-CM

## 2019-06-23 DIAGNOSIS — R293 Abnormal posture: Secondary | ICD-10-CM

## 2019-06-23 DIAGNOSIS — G8929 Other chronic pain: Secondary | ICD-10-CM

## 2019-06-23 NOTE — Therapy (Signed)
[place], [place], [address] Phone: [phone]   Fax:  [phone]  Physical Therapy Treatment  Patient Details  Name: Alisha Espinoza MRN: [id number] Date of Birth: Mar 20, 1966 Referring Provider (PT): Eunice Blase , MD    Encounter Date: 06/23/2019  PT End of Session - 06/23/19 1421    Visit Number  8    Number of Visits  12    Date for PT Re-Evaluation  07/01/19    Authorization Type  MCD    Authorization Time Period  06/16/19-07/13/19    Authorization - Visit Number  3    Authorization - Number of Visits  8    PT Start Time  8280    PT Stop Time  1356    PT Time Calculation (min)  38 min       Past Medical History:  Diagnosis Date  . Bronchitis   . Diabetes mellitus ([place]) 01/19/2019  . Dyspnea   . Heart murmur    resolved "closed by age 54."   . Hyperlipidemia   . Hypertension   . Seasonal allergies     Past Surgical History:  Procedure Laterality Date  . ABDOMINAL HYSTERECTOMY  2000  . ANTERIOR CERVICAL DECOMP/DISCECTOMY FUSION  03/19/2012   Procedure: ANTERIOR CERVICAL DECOMPRESSION/DISCECTOMY FUSION 1 LEVEL;  Surgeon: Melina Schools, MD;  Location: [place];  Service: Orthopedics;  Laterality: Left;  Total Disc Replacement C4-5  . ANTERIOR CERVICAL DECOMP/DISCECTOMY FUSION N/A 05/07/2012   Procedure: ANTERIOR CERVICAL DECOMPRESSION/DISCECTOMY FUSION 1 LEVEL/HARDWARE REMOVAL;  Surgeon: Melina Schools, MD;  Location: [place];  Service: Orthopedics;  Laterality: N/A;  REMOVAL OF CERVICAL DISC REPLACEMENT AND ACDF C4-5  . BACK SURGERY    . CARPAL TUNNEL RELEASE Right 09/28/2018   Procedure: CARPAL TUNNEL RELEASE;  Surgeon: Eustace Moore, MD;  Location: [place];  Service: Neurosurgery;  Laterality: Right;  CARPAL TUNNEL RELEASE  . CERVICAL FUSION  05/07/2012   Dr Rolena Infante  . COLONOSCOPY    . CYSTECTOMY     L wrist  . POLYPECTOMY    . ROTATOR CUFF REPAIR     Right  . TRIGGER FINGER RELEASE Left  05/03/2019   Procedure: LEFT LONG AND RING FINGER RELEASE TRIGGER FINGER/A-1 PULLEY;  Surgeon: Leanora Cover, MD;  Location: [place];  Service: Orthopedics;  Laterality: Left;  beir block  . TYMPANOSTOMY TUBE PLACEMENT      There were no vitals filed for this visit.  Subjective Assessment - 06/23/19 1323    Subjective  Back is stiff. I use heat and ice in the morning to get going.    Currently in Pain?  Yes    Pain Score  5     Pain Location  Back                       [facility] Adult PT Treatment/Exercise - 06/23/19 0001      Lumbar Exercises: Stretches   Active Hamstring Stretch  2 reps;20 seconds    Active Hamstring Stretch Limitations  from physiolball     Other Lumbar Stretch Exercise  modified childs pose forward and laterals       Lumbar Exercises: Aerobic   Nustep  L4 x 5 min UE/LE      Lumbar Exercises: Standing   Heel Raises  10 reps    Functional Squats Limitations  mini-squat at counter x 10 reps   cues to avoid knee flexion past toes  Row  AROM;Strengthening;Both;15 reps    Theraband Level (Row)  Level 3 (Green)    Shoulder Extension  AROM;Strengthening;Both;15 reps    Theraband Level (Shoulder Extension)  Level 3 (Green)    Other Standing Lumbar Exercises  hip abduction 5 reps each x 2 , hip flexion x 5 reops each then marching       Lumbar Exercises: Supine   Bridge  15 reps    Bridge Limitations  legs over green physioball     Other Supine Lumbar Exercises  Heel on 65 cm ball- abdominal draw in with roll out/roll in     Other Supine Lumbar Exercises  Lower trunk rotations with feet on physioball - small rom       Moist Heat Therapy   Number Minutes Moist Heat  10 Minutes    Moist Heat Location  Lumbar Spine   in sitting     Manual Therapy   Joint Mobilization  LAD bilat. hips grade I-III oscillations   pain in left ankle   Soft tissue mobilization  STM right lumbar paraspinals in left sidelying               PT  Short Term Goals - 05/27/19 1429      PT SHORT TERM GOAL #1   Title  She will be indpendent with initial hEP    Baseline  met    Time  2    Period  Weeks    Status  Achieved      PT SHORT TERM GOAL #2   Title  She will report less pain with walking in parlking lot at home.    Baseline  reports 50 feet and needs to stop    Time  2    Period  Weeks    Status  On-going        PT Long Term Goals - 05/27/19 1430      PT LONG TERM GOAL #1   Title  She will be indpendent with all HEP issued    Baseline  updates ongoing    Time  6    Period  Weeks    Status  On-going      PT LONG TERM GOAL #2   Title  She will report pain with walking in community  decreased 50% or more with shopping    Baseline  goal ongoing    Time  6    Period  Weeks    Status  On-going      PT LONG TERM GOAL #3   Title  She will report 50% increase in home tasks without pain    Baseline  all home tasks cause some pain    Time  6    Period  Weeks    Status  On-going      PT LONG TERM GOAL #4   Title  She will report understanding of use of cold or heat and stretching to ease any increase pain    Baseline  reports understanding heat/ice, ability to manage with exercises ongoing    Time  6    Period  Weeks    Status  On-going              Patient will benefit from skilled therapeutic intervention in order to improve the following deficits and impairments:     Visit Diagnosis: Chronic bilateral low back pain with left-sided sciatica  Abnormal posture  Muscle spasm of back     Problem List  Patient Active Problem List   Diagnosis Date Noted  . 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 Hemorrhoids 01/21/2019  . Essential hypertension 01/21/2019  . Serum calcium elevated 01/21/2019  . Elevated cholesterol 01/19/2019  . Diabetes mellitus ([place]) 01/19/2019  . Trigger ring finger of left hand 01/11/2019  . Trigger finger of right thumb 01/11/2019  . 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 Acute bronchitis  08/08/2015  . Leukocytosis 08/08/2015  . Hyperglycemia 08/08/2015  . Tobacco abuse 08/08/2015  . 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 BACTERIAL PNEUMONIA, RIGHT LOWER LOBE 04/27/2008  . LUMP OR MASS IN BREAST 04/27/2008  . COUGH 04/27/2008    Dorene Ar , PTA 06/23/2019, 2:28 PM  [facility] [address] [place], [place], [address] Phone: [phone]   Fax:  [phone]  Name: KENITRA LEVENTHAL MRN: [id number] Date of Birth: 1965/11/08

## 2019-06-24 ENCOUNTER — Telehealth: Payer: Self-pay | Admitting: Family Medicine

## 2019-06-24 NOTE — Telephone Encounter (Signed)
Please advise 

## 2019-06-24 NOTE — Telephone Encounter (Signed)
Patient request something for pain. Pt's call back # [phone]  Pharmacy: [facility] [place]

## 2019-06-25 ENCOUNTER — Telehealth: Payer: Self-pay | Admitting: Physical Therapy

## 2019-06-25 ENCOUNTER — Ambulatory Visit: Payer: Medicaid Other | Admitting: Physical Therapy

## 2019-06-25 MED ORDER — TRAMADOL HCL 50 MG PO TABS: 50.0000 mg | ORAL_TABLET | Freq: Two times a day (BID) | ORAL | 0 refills | Status: DC | PRN

## 2019-06-25 NOTE — Telephone Encounter (Signed)
Patient aware this was called in for her  

## 2019-06-25 NOTE — Telephone Encounter (Signed)
Attempted to call regarding missed therapy appointment this AM. Left voicemail with request to return call if wishing to reschedule.

## 2019-06-25 NOTE — Telephone Encounter (Signed)
Tramadol Rx sent 

## 2019-06-29 ENCOUNTER — Telehealth: Payer: Self-pay

## 2019-06-29 NOTE — Telephone Encounter (Signed)
Attempted PA through [employer] for Anucort suppositories.  They are NOT covered and are $200 for 12 suppositories.  Called and informed patient. They have been covered in the past. I reminded her  that Dr. Silverio Decamp had suggested at her last visit that she can try Recticare. She said she will try that and will call back next week (when Dr. Silverio Decamp is back in the office) if that is not helping and we can see what else Dr. Silverio Decamp would like for her to try. She has an appt in 3 weeks and we confirmed the day and time.

## 2019-06-30 ENCOUNTER — Other Ambulatory Visit: Payer: Self-pay

## 2019-06-30 ENCOUNTER — Encounter: Payer: Self-pay | Admitting: Obstetrics & Gynecology

## 2019-06-30 ENCOUNTER — Telehealth: Payer: Self-pay | Admitting: Family Medicine

## 2019-06-30 ENCOUNTER — Other Ambulatory Visit: Payer: Self-pay | Admitting: Gastroenterology

## 2019-06-30 ENCOUNTER — Ambulatory Visit: Payer: Medicaid Other | Admitting: Obstetrics & Gynecology

## 2019-06-30 VITALS — BP 108/72 | HR 90 | Ht 66.0 in | Wt 192.0 lb

## 2019-06-30 DIAGNOSIS — Z90710 Acquired absence of both cervix and uterus: Secondary | ICD-10-CM

## 2019-06-30 DIAGNOSIS — K649 Unspecified hemorrhoids: Secondary | ICD-10-CM

## 2019-06-30 DIAGNOSIS — K6289 Other specified diseases of anus and rectum: Secondary | ICD-10-CM | POA: Diagnosis not present

## 2019-06-30 MED ORDER — HYDROCORTISONE ACETATE 25 MG RE SUPP: RECTAL | 1 refills | Status: DC

## 2019-06-30 NOTE — Progress Notes (Signed)
Patient ID: Alisha Espinoza, female   DOB: 02-21-1966, 54 y.o.   MRN: [id number]  Chief Complaint  Patient presents with  . New Patient (Initial Visit)  hemorrhoid pain  HPI Alisha Espinoza is a 54 y.o. female.  No obstetric history on file. G0, s/p TAH 2001. Patient followed by GI and has appointment 4/21 in f/u Several months of perianal pain associated with hemorrhoid.  HPI  Past Medical History:  Diagnosis Date  . Bronchitis   . Diabetes mellitus ([place]) 01/19/2019  . Dyspnea   . Heart murmur    resolved "closed by age 54."   . Hyperlipidemia   . Hypertension   . Seasonal allergies     Past Surgical History:  Procedure Laterality Date  . ABDOMINAL HYSTERECTOMY  2000  . ANTERIOR CERVICAL DECOMP/DISCECTOMY FUSION  03/19/2012   Procedure: ANTERIOR CERVICAL DECOMPRESSION/DISCECTOMY FUSION 1 LEVEL;  Surgeon: Melina Schools, MD;  Location: [facility];  Service: Orthopedics;  Laterality: Left;  Total Disc Replacement C4-5  . ANTERIOR CERVICAL DECOMP/DISCECTOMY FUSION N/A 05/07/2012   Procedure: ANTERIOR CERVICAL DECOMPRESSION/DISCECTOMY FUSION 1 LEVEL/HARDWARE REMOVAL;  Surgeon: Melina Schools, MD;  Location: [facility];  Service: Orthopedics;  Laterality: N/A;  REMOVAL OF CERVICAL DISC REPLACEMENT AND ACDF C4-5  . BACK SURGERY    . CARPAL TUNNEL RELEASE Right 09/28/2018   Procedure: CARPAL TUNNEL RELEASE;  Surgeon: Eustace Moore, MD;  Location: [facility];  Service: Neurosurgery;  Laterality: Right;  CARPAL TUNNEL RELEASE  . CERVICAL FUSION  05/07/2012   Dr Rolena Infante  . COLONOSCOPY    . CYSTECTOMY     L wrist  . POLYPECTOMY    . ROTATOR CUFF REPAIR     Right  . TRIGGER FINGER RELEASE Left 05/03/2019   Procedure: LEFT LONG AND RING FINGER RELEASE TRIGGER FINGER/A-1 PULLEY;  Surgeon: Leanora Cover, MD;  Location: [facility];  Service: Orthopedics;  Laterality: Left;  beir block  . TYMPANOSTOMY TUBE PLACEMENT      Family History  Problem Relation Age of Onset  . Diabetes Mother   .  Hypertension Father   . Pancreatic cancer Sister   . Colon cancer Neg Hx   . Rectal cancer Neg Hx   . Stomach cancer Neg Hx   . Colon polyps Neg Hx   . Esophageal cancer Neg Hx     Social History Social History   Tobacco Use  . Smoking status: Current Every Day Smoker    Packs/day: 0.50    Years: 30.00    Pack years: 15.00    Types: Cigarettes  . Smokeless tobacco: Never Used  . Tobacco comment: 10cigs/day  Substance Use Topics  . Alcohol use: Not Currently  . Drug use: No    Allergies  Allergen Reactions  . Meloxicam Hives  . Mobic [Meloxicam]     Other reaction(s): Hives    Current Outpatient Medications  Medication Sig Dispense Refill  . albuterol (PROVENTIL HFA;VENTOLIN HFA) 108 (90 Base) MCG/ACT inhaler Inhale 1-2 puffs into the lungs every 4 (four) hours as needed for wheezing or shortness of breath (or coughing). 1 Inhaler 1  . celecoxib (CELEBREX) 200 MG capsule Take 1 capsule (200 mg total) by mouth 2 (two) times daily as needed. 60 capsule 6  . gabapentin (NEURONTIN) 100 MG capsule 1 PO q HS, may increase to 3 PO q HS if needed/tolerated 90 capsule 3  . Microlet Lancets MISC Use to test blood sugars 1-2 times daily. 100 each 12  . simvastatin (  ZOCOR) 20 MG tablet Take 20 mg by mouth daily.  1  . tiZANidine (ZANAFLEX) 4 MG tablet Take 1 tablet by mouth every 8 (eight) hours as needed for muscle spasms.     . traMADol (ULTRAM) 50 MG tablet Take 1 tablet (50 mg total) by mouth 2 (two) times daily as needed. 30 tablet 0  . valsartan-hydrochlorothiazide (DIOVAN-HCT) 160-12.5 MG tablet Take 1 tablet by mouth daily.    Marland Kitchen azithromycin (ZITHROMAX) 250 MG tablet Take 1 tablet (250 mg total) by mouth daily. Take first 2 tablets together, then 1 every day until finished. (Patient not taking: Reported on 06/30/2019) 6 tablet 0  . Blood Glucose Monitoring Suppl (CONTOUR NEXT MONITOR) w/Device KIT 1 each by Does not apply route daily. Use to test blood sugars 1-2 times daily. 1  kit 0  . dexamethasone (DECADRON) 6 MG tablet Take 1 tablet (6 mg total) by mouth daily. (Patient not taking: Reported on 06/30/2019) 4 tablet 0  . glucose blood (CONTOUR NEXT TEST) test strip Use to test 1-2 times daily. 100 each 12  . HYDROcodone-acetaminophen (NORCO) 5-325 MG tablet 1-2 tabs po q6 hours prn pain (Patient not taking: Reported on 06/30/2019) 20 tablet 0  . hydrocortisone (ANUCORT-HC) 25 MG suppository PLACE 1 SUPPOSITORY RECTALLY ONCE A DAY FOR 1 WEEK AS NEEDED 12 suppository 1  . LINZESS 290 MCG CAPS capsule Take 1 capsule (290 mcg total) by mouth daily before breakfast. 30 capsule 3  . metFORMIN (GLUCOPHAGE) 500 MG tablet Take 1 tablet (500 mg total) by mouth 2 (two) times daily with a meal. 180 tablet 3  . oxyCODONE-acetaminophen (PERCOCET/ROXICET) 5-325 MG tablet Take by mouth.    . TRELEGY ELLIPTA 100-62.5-25 MCG/INH AEPB Inhale 1 puff into the lungs daily.    Marland Kitchen tretinoin (RETIN-A) 0.1 % cream APPLY A SMALL AMOUNT TO THE SKIN  EVERY NIGHT     No current facility-administered medications for this visit.    Review of Systems Review of Systems  Constitutional: Negative.   Respiratory: Negative.   Cardiovascular: Negative.   Gastrointestinal:       Tender hemorrhoid  Genitourinary: Negative for frequency, vaginal bleeding and vaginal discharge.    Blood pressure 108/72, pulse 90, height '5\' 6"'  (1.676 m), weight 192 lb (87.1 kg).  Physical Exam Physical Exam Vitals and nursing note reviewed. Exam conducted with a chaperone present.  Constitutional:      Appearance: She is not ill-appearing.  Pulmonary:     Effort: Pulmonary effort is normal.  Abdominal:     General: Abdomen is flat.     Palpations: Abdomen is soft.  Genitourinary:    General: Normal vulva.     Comments: Tender external hemorrhoid at 11, internal exam deferred Neurological:     Mental Status: She is alert.     Data Reviewed  CLINICAL DATA:  Rectal pain. Concern for fissure with  possible abscess.  EXAM: CT ABDOMEN AND PELVIS WITH CONTRAST  TECHNIQUE: Multidetector CT imaging of the abdomen and pelvis was performed using the standard protocol following bolus administration of intravenous contrast.  CONTRAST:  14m OMNIPAQUE IOHEXOL 300 MG/ML  SOLN  COMPARISON:  Abdominopelvic CT 01/19/2019  FINDINGS: Lower chest: Similar linear opacity in the right middle lobe likely scarring. No acute airspace disease. No pleural fluid.  Hepatobiliary: No focal liver abnormality is seen. No gallstones, gallbladder wall thickening, or biliary dilatation.  Pancreas: No ductal dilatation or inflammation.  Spleen: Normal in size without focal abnormality.  Adrenals/Urinary Tract: Unchanged  bilateral low-density adrenal nodules consistent with adenoma. This measures 1.7 cm on the right and 1.6 cm on the left (unchanged allowing for differences in caliper placement). No hydronephrosis or perinephric edema. Homogeneous renal enhancement with symmetric excretion on delayed phase imaging. Urinary bladder is physiologically distended without wall thickening.  Stomach/Bowel: Stomach is decompressed. Normal positioning of the ligament of Treitz. No small bowel wall thickening, obstruction, or inflammation. Normal appendix. Moderate stool burden throughout the colon. No colonic wall thickening. Diminished rectal wall thickening and mucosal enhancement from prior exam. No perirectal fluid collection.  Vascular/Lymphatic: Moderate aortic atherosclerosis. No aneurysm. No enlarged lymph nodes in the abdomen or pelvis.  Reproductive: Uterus surgically absent. Ovaries symmetric and quiescent. No suspicious adnexal mass.  Other: No free air, free fluid, or intra-abdominal fluid collection.  Musculoskeletal: Posterior L3-L5 fusion with interbody spacers. Again seen sclerosis involving the vertebral endplates at these levels. No acute osseous  abnormalities.  IMPRESSION: 1. Diminished rectal wall thickening and mucosal enhancement from CT 3 weeks prior. No significant residual inflammation on CT. No perirectal abscess. 2. No acute findings or other change from prior exam.  Aortic Atherosclerosis (ICD10-I70.0).   Electronically Signed   By: Keith Rake M.D.   On: 02/10/2019 21:29  Assessment S/P TAH Hemorrhoid pain  Plan F/U with GI as scheduled Anusol reordered Yearly mammography Pap test not indicated after total hysterectomy May need genl surgery referral   Emeterio Reeve 06/30/2019, 11:53 AM

## 2019-06-30 NOTE — Telephone Encounter (Signed)
Alisha Espinoza from [facility] called requesting a call back from Dr. Junius Roads assistant Karna Christmas or Dr. Junius Roads nurse. Alisha Espinoza states attending physician statement has some discrepancies. Alisha Espinoza has several question about return to work notes and dates of service to patient. Alisha Espinoza direct number is [phone]. Sent as high priority.

## 2019-06-30 NOTE — Telephone Encounter (Signed)
I called Alisha Espinoza: 1) She needs clarification on the return-to-work date. The [employer]PS states 09/21/18, but she was not seen here until 03/23/19 (for her one and only time here). Please see the printed out copy. 2) If the date is supposed to be 09/21/19, is there any reason why the patient cannot return to work sooner, doing sedentary work only?

## 2019-06-30 NOTE — Patient Instructions (Signed)

## 2019-07-01 NOTE — Telephone Encounter (Signed)
I called and left this full message on Alisha Espinoza confidential voice mail. Dr. Junius Roads did say that from the 03/23/19 office visit findings, the patient should be able to return to sedentary work now.

## 2019-07-01 NOTE — Telephone Encounter (Signed)
I did not take her out of work and I have only seen her one time.  It has been more than a year since her surgery with Dr. Ronnald Ramp.  From my perspective, I would recommend referring her for a functional capacity evaluation to see what work she is able to do.

## 2019-07-02 ENCOUNTER — Emergency Department (HOSPITAL_COMMUNITY): Payer: Medicaid Other

## 2019-07-02 ENCOUNTER — Emergency Department (HOSPITAL_COMMUNITY)
Admission: EM | Admit: 2019-07-02 | Discharge: 2019-07-02 | Disposition: A | Discharge status: Home / Self Care | Payer: Medicaid Other | Attending: Emergency Medicine | Admitting: Emergency Medicine

## 2019-07-02 ENCOUNTER — Telehealth: Payer: Self-pay | Admitting: Internal Medicine

## 2019-07-02 ENCOUNTER — Other Ambulatory Visit: Payer: Self-pay

## 2019-07-02 ENCOUNTER — Encounter (HOSPITAL_COMMUNITY): Payer: Self-pay

## 2019-07-02 DIAGNOSIS — E119 Type 2 diabetes mellitus without complications: Secondary | ICD-10-CM | POA: Diagnosis not present

## 2019-07-02 DIAGNOSIS — Z79899 Other long term (current) drug therapy: Secondary | ICD-10-CM | POA: Insufficient documentation

## 2019-07-02 DIAGNOSIS — K6289 Other specified diseases of anus and rectum: Secondary | ICD-10-CM | POA: Diagnosis present

## 2019-07-02 DIAGNOSIS — F17210 Nicotine dependence, cigarettes, uncomplicated: Secondary | ICD-10-CM | POA: Insufficient documentation

## 2019-07-02 DIAGNOSIS — I10 Essential (primary) hypertension: Secondary | ICD-10-CM | POA: Insufficient documentation

## 2019-07-02 DIAGNOSIS — K649 Unspecified hemorrhoids: Secondary | ICD-10-CM

## 2019-07-02 DIAGNOSIS — Z7984 Long term (current) use of oral hypoglycemic drugs: Secondary | ICD-10-CM | POA: Insufficient documentation

## 2019-07-02 LAB — CBC
HCT: 48.3 % — ABNORMAL HIGH (ref 36.0–46.0)
Hemoglobin: 15.6 g/dL — ABNORMAL HIGH (ref 12.0–15.0)
MCH: 26.9 pg (ref 26.0–34.0)
MCHC: 32.3 g/dL (ref 30.0–36.0)
MCV: 83.3 fL (ref 80.0–100.0)
Platelets: 153 10*3/uL (ref 150–400)
RBC: 5.8 MIL/uL — ABNORMAL HIGH (ref 3.87–5.11)
RDW: 16.7 % — ABNORMAL HIGH (ref 11.5–15.5)
WBC: 7.7 10*3/uL (ref 4.0–10.5)
nRBC: 0 % (ref 0.0–0.2)

## 2019-07-02 LAB — BASIC METABOLIC PANEL
Anion gap: 9 (ref 5–15)
BUN: 15 mg/dL (ref 6–20)
CO2: 24 mmol/L (ref 22–32)
Calcium: 10 mg/dL (ref 8.9–10.3)
Chloride: 107 mmol/L (ref 98–111)
Creatinine, Ser: 0.86 mg/dL (ref 0.44–1.00)
GFR calc Af Amer: >60 mL/min (ref >60)
GFR calc non Af Amer: >60 mL/min (ref >60)
Glucose, Bld: 88 mg/dL (ref 70–99)
Potassium: 5.3 mmol/L — ABNORMAL HIGH (ref 3.5–5.1)
Sodium: 140 mmol/L (ref 135–145)

## 2019-07-02 LAB — TYPE AND SCREEN
ABO/RH(D): B POS
Antibody Screen: NEGATIVE

## 2019-07-02 MED ORDER — LIDOCAINE 4 % EX CREA
TOPICAL_CREAM | Freq: Once | CUTANEOUS | Status: AC
  Administered 2019-07-02: 1 via TOPICAL
  Filled 2019-07-02: qty 5

## 2019-07-02 MED ORDER — SODIUM CHLORIDE (PF) 0.9 % IJ SOLN
INTRAMUSCULAR | Status: AC
  Filled 2019-07-02: qty 50

## 2019-07-02 MED ORDER — LIDOCAINE-HYDROCORTISONE ACE 2.8-0.55 % RE GEL: 1.0000 "application " | Freq: Four times a day (QID) | RECTAL | 0 refills | Status: DC | PRN

## 2019-07-02 MED ORDER — IOHEXOL 300 MG/ML  SOLN
100.0000 mL | Freq: Once | INTRAMUSCULAR | Status: AC | PRN
  Administered 2019-07-02: 100 mL via INTRAVENOUS

## 2019-07-02 MED ORDER — HYDROCORTISONE (PERIANAL) 2.5 % EX CREA: 1.0000 "application " | TOPICAL_CREAM | Freq: Two times a day (BID) | CUTANEOUS | 1 refills | Status: DC

## 2019-07-02 NOTE — ED Provider Notes (Signed)
[facility] DEPT Provider Note   CSN: [id number] Arrival date & time: 07/02/19  1111     History Chief Complaint  Patient presents with  . Rectal Pain  . Rectal Bleeding    Alisha Espinoza is a 54 y.o. female.  HPI    Patient presents to the ED with complaints of rectal pain and bleeding.  Patient initially was seen in the emergency room back on November 14.  At that time patient indicated she was having anal pain that was attributed to a fissure.  Patient states since that time she has had several evaluations by gastroenterology, Dr. Juliann Mule.  Patient ended up having a flexible sigmoidoscopy in December 2020.  Patient states she has been diagnosed with internal and external hemorrhoids.  Patient was felt to have possible anal fissure versus anal rectal spasm versus symptomatic hemorrhoids.  Patient subsequently followed up with OB/GYN on March 31.  Work-up did not suggest an acute GYN condition.  Patient was instructed to follow-up with GI.  Patient states she has continued to have rectal bleeding and pain.  She has tried topical medications without significant improvement.  Patient states today she noticed blood in the commode.  She initially saw a spider blood and then some blood in the stool.  She called the GI doctor and they recommended Anusol cream and sitz bath.  Patient decided come to the ED for further evaluation.  Past Medical History:  Diagnosis Date  . Bronchitis   . Diabetes mellitus ([place]) 01/19/2019  . Dyspnea   . Heart murmur    resolved "closed by age 40."   . Hyperlipidemia   . Hypertension   . Seasonal allergies     Patient Active Problem List   Diagnosis Date Noted  . 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 Hemorrhoids 01/21/2019  . Essential hypertension 01/21/2019  . Serum calcium elevated 01/21/2019  . Elevated cholesterol 01/19/2019  . Diabetes mellitus ([place]) 01/19/2019  . Trigger ring finger of left hand 01/11/2019  . Trigger finger  of right thumb 01/11/2019  . 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 Acute bronchitis 08/08/2015  . Leukocytosis 08/08/2015  . Hyperglycemia 08/08/2015  . Tobacco abuse 08/08/2015  . 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 BACTERIAL PNEUMONIA, RIGHT LOWER LOBE 04/27/2008  . LUMP OR MASS IN BREAST 04/27/2008  . COUGH 04/27/2008    Past Surgical History:  Procedure Laterality Date  . ABDOMINAL HYSTERECTOMY  2000  . ANTERIOR CERVICAL DECOMP/DISCECTOMY FUSION  03/19/2012   Procedure: ANTERIOR CERVICAL DECOMPRESSION/DISCECTOMY FUSION 1 LEVEL;  Surgeon: Melina Schools, MD;  Location: [facility];  Service: Orthopedics;  Laterality: Left;  Total Disc Replacement C4-5  . ANTERIOR CERVICAL DECOMP/DISCECTOMY FUSION N/A 05/07/2012   Procedure: ANTERIOR CERVICAL DECOMPRESSION/DISCECTOMY FUSION 1 LEVEL/HARDWARE REMOVAL;  Surgeon: Melina Schools, MD;  Location: [facility];  Service: Orthopedics;  Laterality: N/A;  REMOVAL OF CERVICAL DISC REPLACEMENT AND ACDF C4-5  . BACK SURGERY    . CARPAL TUNNEL RELEASE Right 09/28/2018   Procedure: CARPAL TUNNEL RELEASE;  Surgeon: Eustace Moore, MD;  Location: [facility];  Service: Neurosurgery;  Laterality: Right;  CARPAL TUNNEL RELEASE  . CERVICAL FUSION  05/07/2012   Dr Rolena Infante  . COLONOSCOPY    . CYSTECTOMY     L wrist  . POLYPECTOMY    . ROTATOR CUFF REPAIR     Right  . TRIGGER FINGER RELEASE Left 05/03/2019   Procedure: LEFT LONG AND RING FINGER RELEASE TRIGGER FINGER/A-1 PULLEY;  Surgeon: Leanora Cover,  MD;  Location: [facility];  Service: Orthopedics;  Laterality: Left;  beir block  . TYMPANOSTOMY TUBE PLACEMENT       OB History   No obstetric history on file.     Family History  Problem Relation Age of Onset  . Diabetes Mother   . Hypertension Father   . Pancreatic cancer Sister   . Colon cancer Neg Hx   . Rectal cancer Neg Hx   . Stomach cancer Neg Hx   . Colon polyps Neg Hx   . Esophageal cancer Neg Hx     Social History   Tobacco  Use  . Smoking status: Current Every Day Smoker    Packs/day: 0.50    Years: 30.00    Pack years: 15.00    Types: Cigarettes  . Smokeless tobacco: Never Used  . Tobacco comment: 10cigs/day  Substance Use Topics  . Alcohol use: Not Currently  . Drug use: No    Home Medications Prior to Admission medications   Medication Sig Start Date End Date Taking? Authorizing Provider  albuterol (PROVENTIL HFA;VENTOLIN HFA) 108 (90 Base) MCG/ACT inhaler Inhale 1-2 puffs into the lungs every 4 (four) hours as needed for wheezing or shortness of breath (or coughing). 09/22/74  Yes Delora Fuel, MD  celecoxib (CELEBREX) 200 MG capsule Take 1 capsule (200 mg total) by mouth 2 (two) times daily as needed. 03/23/19  Yes Hilts, Legrand Como, MD  gabapentin (NEURONTIN) 100 MG capsule 1 PO q HS, may increase to 3 PO q HS if needed/tolerated Patient taking differently: Take 100 mg by mouth daily.  03/23/19  Yes Hilts, Legrand Como, MD  hydrocortisone (ANUSOL-HC) 2.5 % rectal cream Place 1 application rectally 2 (two) times daily. 07/02/19  Yes Gatha Mayer, MD  LINZESS 290 MCG CAPS capsule TAKE 1 CAPSULE(290 MCG) BY MOUTH DAILY BEFORE BREAKFAST Patient taking differently: Take 290 mcg by mouth daily before breakfast.  07/01/19  Yes Nandigam, Venia Minks, MD  metFORMIN (GLUCOPHAGE) 500 MG tablet Take 1 tablet (500 mg total) by mouth 2 (two) times daily with a meal. 01/21/19  Yes Libby Maw, MD  simvastatin (ZOCOR) 20 MG tablet Take 20 mg by mouth daily. 01/26/18  Yes [provider]  tiZANidine (ZANAFLEX) 4 MG tablet Take 1 tablet by mouth every 8 (eight) hours as needed for muscle spasms.  01/04/19  Yes [provider]  traMADol (ULTRAM) 50 MG tablet Take 1 tablet (50 mg total) by mouth 2 (two) times daily as needed. 06/25/19  Yes Hilts, Legrand Como, MD  TRELEGY ELLIPTA 100-62.5-25 MCG/INH AEPB Inhale 1 puff into the lungs daily. 01/04/19  Yes [provider]  tretinoin (RETIN-A) 0.1 % cream  Apply 1 application topically at bedtime.  02/17/19  Yes [provider]  valsartan-hydrochlorothiazide (DIOVAN-HCT) 160-12.5 MG tablet Take 1 tablet by mouth daily. 02/08/19  Yes [provider]  azithromycin (ZITHROMAX) 250 MG tablet Take 1 tablet (250 mg total) by mouth daily. Take first 2 tablets together, then 1 every day until finished. Patient not taking: Reported on 06/30/2019 05/08/19   Virgel Manifold, MD  Blood Glucose Monitoring Suppl (CONTOUR NEXT MONITOR) w/Device KIT 1 each by Does not apply route daily. Use to test blood sugars 1-2 times daily. 02/02/19   Libby Maw, MD  dexamethasone (DECADRON) 6 MG tablet Take 1 tablet (6 mg total) by mouth daily. Patient not taking: Reported on 06/30/2019 05/08/19   Virgel Manifold, MD  glucose blood (CONTOUR NEXT TEST) test strip Use  to test 1-2 times daily. 02/02/19   Libby Maw, MD  HYDROcodone-acetaminophen [facility]) 5-325 MG tablet 1-2 tabs po q6 hours prn pain Patient not taking: Reported on 06/30/2019 05/03/19   Leanora Cover, MD  hydrocortisone (ANUCORT-HC) 25 MG suppository PLACE 1 SUPPOSITORY RECTALLY ONCE A DAY FOR 1 WEEK AS NEEDED Patient not taking: Reported on 07/02/2019 06/30/19   Woodroe Mode, MD  Lidocaine-Hydrocortisone Ace 2.8-0.55 % GEL Place 1 application rectally 4 (four) times daily as needed. 07/02/19   Dorie Rank, MD  Microlet Lancets MISC Use to test blood sugars 1-2 times daily. 02/02/19   Libby Maw, MD    Allergies    Meloxicam and Mobic [meloxicam]  Review of Systems   Review of Systems  All other systems reviewed and are negative.   Physical Exam Updated Vital Signs BP 117/70 (BP Location: Left Arm)   Pulse 77   Temp 97.6 F (36.4 C) (Oral)   Resp 18   Ht 1.676 m ('5\' 6"' )   Wt 88 kg   SpO2 95%   BMI 31.31 kg/m   Physical Exam Vitals and nursing note reviewed.  Constitutional:      General: She is not in acute distress.    Appearance: She is well-developed.    HENT:     Head: Normocephalic and atraumatic.     Right Ear: External ear normal.     Left Ear: External ear normal.  Eyes:     General: No scleral icterus.       Right eye: No discharge.        Left eye: No discharge.     Conjunctiva/sclera: Conjunctivae normal.  Neck:     Trachea: No tracheal deviation.  Cardiovascular:     Rate and Rhythm: Normal rate and regular rhythm.  Pulmonary:     Effort: Pulmonary effort is normal. No respiratory distress.     Breath sounds: Normal breath sounds. No stridor. No wheezing or rales.  Abdominal:     General: Bowel sounds are normal. There is no distension.     Palpations: Abdomen is soft.     Tenderness: There is no abdominal tenderness. There is no guarding or rebound.  Genitourinary:    General: Normal vulva.     Comments: No gross blood noted on rectal exam, small external hemorrhoid without bleeding or thrombosis, significant tenderness with digital rectal exam, no gross mass appreciated Musculoskeletal:        General: No tenderness.     Cervical back: Neck supple.  Skin:    General: Skin is warm and dry.     Findings: No rash.  Neurological:     Mental Status: She is alert.     Cranial Nerves: No cranial nerve deficit (no facial droop, extraocular movements intact, no slurred speech).     Sensory: No sensory deficit.     Motor: No abnormal muscle tone or seizure activity.     Coordination: Coordination normal.     ED Results / Procedures / Treatments   Labs (all labs ordered are listed, but only abnormal results are displayed) Labs Reviewed  CBC - Abnormal; Notable for the following components:      Result Value   RBC 5.80 (*)    Hemoglobin 15.6 (*)    HCT 48.3 (*)    RDW 16.7 (*)    All other components within normal limits  BASIC METABOLIC PANEL - Abnormal; Notable for the following components:   Potassium 5.3 (*)  All other components within normal limits  TYPE AND SCREEN  ABO/RH    EKG None  Radiology CT  PELVIS W CONTRAST  Result Date: 07/02/2019 CLINICAL DATA:  Rectal pain and bleeding. EXAM: CT PELVIS WITH CONTRAST TECHNIQUE: Multidetector CT imaging of the pelvis was performed using the standard protocol following the bolus administration of intravenous contrast. CONTRAST:  154m OMNIPAQUE IOHEXOL 300 MG/ML  SOLN COMPARISON:  CT abdomen pelvis dated February 10, 2019. FINDINGS: Urinary Tract:  No abnormality visualized. Bowel: Mild wall thickening of the rectum with mucosal hyperemia. No surrounding inflammatory changes. No perianal abscess. Vascular/Lymphatic: Aortoiliac atherosclerotic vascular disease. No enlarged pelvic lymph nodes. Reproductive:  Status post hysterectomy. No adnexal mass. Other:  None.  Normal presacral fat. Musculoskeletal: No acute or significant osseous findings. IMPRESSION: 1. Mild wall thickening of the proximal rectum with mucosal hyperemia, suggestive of proctitis. No perianal abscess. Electronically Signed   By: WTitus DubinM.D.   On: 07/02/2019 14:03    Procedures Procedures (including critical care time)  Medications Ordered in ED Medications  lidocaine (LMX) 4 % cream (1 application Topical Given 07/02/19 1244)  iohexol (OMNIPAQUE) 300 MG/ML solution 100 mL (100 mLs Intravenous Contrast Given 07/02/19 1342)    ED Course  I have reviewed the triage vital signs and the nursing notes.  Pertinent labs & imaging results that were available during my care of the patient were reviewed by me and considered in my medical decision making (see chart for details).  Clinical Course as of Jul 02 1426  Fri Jul 02, 2019  1215 Patient's pain seems out of proportion.  Concerned about the possibility of a perianal abscess.  This most likely may be her chronic GI issues but I will proceed with CT scan to rule out any acute emergent condition   [JK]  1408 Labs reassuring.  CT scan without abscess, possible proctitis.     [JK]    Clinical Course User Index [JK] KDorie Rank MD     MDM Rules/Calculators/A&P                      Patient's laboratory tests are reassuring.  CT scan shows some mild wall thickening of the proximal rectum.  This appears to be similar to previous scans.  ? Etiology of her rectal pain.  No sign of serious gi bleed. Will try lidocaine and steroid cream. Follow up with her GI doctor Final Clinical Impression(s) / ED Diagnoses Final diagnoses:  Rectal pain    Rx / DC Orders ED Discharge Orders         Ordered    Lidocaine-Hydrocortisone Ace 2.8-0.55 % GEL  4 times daily PRN     07/02/19 1427           KDorie Rank MD 07/02/19 1432

## 2019-07-02 NOTE — Discharge Instructions (Signed)
Pick up the prescription for the rectal steroid cream, follow up with your GI doctor for further treatment

## 2019-07-02 NOTE — ED Notes (Signed)
Discharge paperwork and prescription reviewed with pt.  Pt with no questions or concerns at this time.  Ambulatory at time of discharge, NAD.

## 2019-07-02 NOTE — Telephone Encounter (Signed)
Hx hemorrhoids, anal fissure, CIC, rectal burning GYN told her she had a hemorrhoid 3/31 and she can feel a protrusion Splatter of blood into commode this AM  Rx Anusol HC cream Sitz baths  She has f/u 4/21 [facility]  Call back prn

## 2019-07-02 NOTE — ED Triage Notes (Signed)
Patient c/o rectal pain and rectal bleeding tht just started today. Patient states she does have hemorrhoids, but today is the first time she has had bleeding. Patient states she went to there physician 2 days ago and was told she had an internal and external hemorrhoid.

## 2019-07-03 LAB — ABO/RH: ABO/RH(D): B POS

## 2019-07-05 NOTE — Telephone Encounter (Signed)
Thank you Carl 

## 2019-07-06 ENCOUNTER — Telehealth: Payer: Self-pay | Admitting: Gastroenterology

## 2019-07-06 NOTE — Telephone Encounter (Signed)
Please send referral to colorectal surgery for evaluation and possible hemorrhoid surgery. Thanks

## 2019-07-06 NOTE — Telephone Encounter (Signed)
Patient calls with report of continuing rectal pain. She spoke with [facility] on call doctor. She followed the recommendations. Got minimal relief. Went to the ER when she felt she "could not handle it" and was given lidocaine cream. States her hemorrhoid gets smaller then bigger again. "I want this thing cut out." GYN was not helpful. Please advise. Thanks

## 2019-07-06 NOTE — Telephone Encounter (Signed)
Office visit notes and procedure reports faxed to [facility] requesting an appointment with Drs Marcello Moores or Kae Heller.  Patient aware. She will continue her comfort measures.

## 2019-07-07 ENCOUNTER — Telehealth: Payer: Self-pay | Admitting: Family Medicine

## 2019-07-07 MED ORDER — TRAMADOL HCL 50 MG PO TABS: 50.0000 mg | ORAL_TABLET | Freq: Two times a day (BID) | ORAL | 0 refills | Status: DC | PRN

## 2019-07-07 NOTE — Telephone Encounter (Signed)
Please advise 

## 2019-07-07 NOTE — Telephone Encounter (Signed)
Patient called needing Rx refilled Tramadol. The number to contact patient is [phone]

## 2019-07-14 ENCOUNTER — Ambulatory Visit: Payer: Medicaid Other | Attending: Family Medicine | Admitting: Physical Therapy

## 2019-07-14 ENCOUNTER — Encounter: Payer: Self-pay | Admitting: Physical Therapy

## 2019-07-14 ENCOUNTER — Other Ambulatory Visit: Payer: Self-pay

## 2019-07-14 DIAGNOSIS — M62830 Muscle spasm of back: Secondary | ICD-10-CM | POA: Diagnosis present

## 2019-07-14 DIAGNOSIS — R293 Abnormal posture: Secondary | ICD-10-CM | POA: Insufficient documentation

## 2019-07-14 DIAGNOSIS — G8929 Other chronic pain: Secondary | ICD-10-CM | POA: Diagnosis present

## 2019-07-14 DIAGNOSIS — M5442 Lumbago with sciatica, left side: Secondary | ICD-10-CM | POA: Insufficient documentation

## 2019-07-14 NOTE — Therapy (Signed)
[place], [place], [address] Phone: [phone]   Fax:  [phone]  Physical Therapy Treatment/Re-evaluation  Patient Details  Name: Alisha Espinoza MRN: [id number] Date of Birth: Aug 11, 1965 Referring Provider (PT): Eunice Blase , MD    Encounter Date: 07/14/2019  PT End of Session - 07/14/19 1350    Visit Number  9    Number of Visits  17    Date for PT Re-Evaluation  08/18/19    Authorization Type  MCD-needs new authorization    PT Start Time  1331    PT Stop Time  1415    PT Time Calculation (min)  44 min    Activity Tolerance  Patient limited by pain    Behavior During Therapy  [facility] for tasks assessed/performed       Past Medical History:  Diagnosis Date  . Bronchitis   . Diabetes mellitus ([place]) 01/19/2019  . Dyspnea   . Heart murmur    resolved "closed by age 13."   . Hyperlipidemia   . Hypertension   . Seasonal allergies     Past Surgical History:  Procedure Laterality Date  . ABDOMINAL HYSTERECTOMY  2000  . ANTERIOR CERVICAL DECOMP/DISCECTOMY FUSION  03/19/2012   Procedure: ANTERIOR CERVICAL DECOMPRESSION/DISCECTOMY FUSION 1 LEVEL;  Surgeon: Melina Schools, MD;  Location: [place];  Service: Orthopedics;  Laterality: Left;  Total Disc Replacement C4-5  . ANTERIOR CERVICAL DECOMP/DISCECTOMY FUSION N/A 05/07/2012   Procedure: ANTERIOR CERVICAL DECOMPRESSION/DISCECTOMY FUSION 1 LEVEL/HARDWARE REMOVAL;  Surgeon: Melina Schools, MD;  Location: [place];  Service: Orthopedics;  Laterality: N/A;  REMOVAL OF CERVICAL DISC REPLACEMENT AND ACDF C4-5  . BACK SURGERY    . CARPAL TUNNEL RELEASE Right 09/28/2018   Procedure: CARPAL TUNNEL RELEASE;  Surgeon: Eustace Moore, MD;  Location: [place];  Service: Neurosurgery;  Laterality: Right;  CARPAL TUNNEL RELEASE  . CERVICAL FUSION  05/07/2012   Dr Rolena Infante  . COLONOSCOPY    . CYSTECTOMY     L wrist  . POLYPECTOMY    . ROTATOR CUFF REPAIR     Right  .  TRIGGER FINGER RELEASE Left 05/03/2019   Procedure: LEFT LONG AND RING FINGER RELEASE TRIGGER FINGER/A-1 PULLEY;  Surgeon: Leanora Cover, MD;  Location: [place];  Service: Orthopedics;  Laterality: Left;  beir block  . TYMPANOSTOMY TUBE PLACEMENT      There were no vitals filed for this visit.  Subjective Assessment - 07/14/19 1334    Subjective  Pt. returns, not seen for PT since 06/23/19 and reports absence due to some family issues. She has been doing some OT visits for her left hand s/p trigger finger surgery. For back she continues to report moderate pain today 5/10 on right side and also reports recent left foot pain. Mild improvement noted from previous PT visits for back.    Pertinent History  L3-4 L4-5  fusion.       Cervical surgery x 2  one a fusion   left trigger finger surgery    Limitations  Standing;Walking;House hold activities    How long can you walk comfortably?  reports needs to take a break after 50 feet    Patient Stated Goals  She wants to have to have less pain    Currently in Pain?  Yes    Pain Score  5     Pain Location  Back    Pain Orientation  Lower;Right    Pain Descriptors /  Indicators  Aching;Sharp    Pain Type  Chronic pain    Pain Onset  More than a month ago    Pain Frequency  Constant    Aggravating Factors   standing and walking    Pain Relieving Factors  ice/heat    Effect of Pain on Daily Activities  limits walking tolerance         [facility] PT Assessment - 07/14/19 0001      AROM   Lumbar Flexion  90    Lumbar Extension  20    Lumbar - Right Side Bend  22    Lumbar - Left Side Bend  23    Lumbar - Right Rotation  70%    Lumbar - Left Rotation  40%      Strength   Strength Assessment Site  Hip;Knee;Ankle    Right/Left Hip  Right;Left    Right Hip Flexion  5/5    Right Hip Extension  4/5    Right Hip External Rotation   4+/5    Right Hip Internal Rotation  5/5    Right Hip ABduction  4/5    Right Hip ADduction  5/5     Left Hip Flexion  5/5    Left Hip Extension  4/5    Left Hip External Rotation  4+/5    Left Hip Internal Rotation  5/5    Left Hip ABduction  4/5    Left Hip ADduction  5/5    Right/Left Knee  Right;Left    Right Knee Flexion  5/5    Right Knee Extension  5/5    Left Knee Flexion  5/5    Left Knee Extension  5/5    Right/Left Ankle  Right;Left    Right Ankle Dorsiflexion  5/5    Right Ankle Inversion  5/5    Right Ankle Eversion  5/5    Left Ankle Dorsiflexion  5/5    Left Ankle Inversion  5/5    Left Ankle Eversion  5/5                   [facility] Adult PT Treatment/Exercise - 07/14/19 0001      Lumbar Exercises: Stretches   Passive Hamstring Stretch  Right;Left;2 reps;30 seconds      Lumbar Exercises: Aerobic   Nustep  L3 x 5 min LE only      Lumbar Exercises: Supine   Clam  15 reps    Clam Limitations  red band    Bent Knee Raise  15 reps    Bridge  15 reps    Other Supine Lumbar Exercises  hip add. isometric 3-5 sec x 15 reps      Moist Heat Therapy   Number Minutes Moist Heat  10 Minutes    Moist Heat Location  Lumbar Spine      Manual Therapy   Soft tissue mobilization  STM right lumbar paraspinals in left sidelying               PT Short Term Goals - 07/14/19 1358      PT SHORT TERM GOAL #1   Title  She will be indpendent with initial HEP    Baseline  met    Time  2    Period  Weeks    Status  Achieved      PT SHORT TERM GOAL #2   Title  She will report less pain with walking in parking lot at home.  Baseline  previously noting some mild improvement but continues with limitations, continue goal    Time  2    Period  Weeks    Status  On-going        PT Long Term Goals - 07/14/19 1406      PT LONG TERM GOAL #1   Title  She will be indpendent with all HEP issued    Baseline  updates ongoing    Time  4    Period  Weeks    Status  On-going    Target Date  08/18/19      PT LONG TERM GOAL #2   Title  She will report pain  with walking in community  decreased 50% or more with shopping    Baseline  goal ongoing    Time  4    Period  Weeks    Status  On-going    Target Date  08/18/19      PT LONG TERM GOAL #3   Title  She will report 50% increase in home tasks without pain    Baseline  goal ongoing    Time  4    Period  Weeks    Status  On-going    Target Date  08/18/19      PT LONG TERM GOAL #4   Title  She will report understanding of use of cold or heat and stretching to ease any increase pain    Time  4    Period  Weeks    Status  Achieved      PT LONG TERM GOAL #5   Title  Increase bilat. hip extension strength 1/2 MMT grade to improve ability for lifting for chores    Baseline  4/5    Time  4    Period  Weeks    Status  New    Target Date  08/18/19            Plan - 07/14/19 1354    Clinical Impression Statement  Pt. returns for 9th therapy visit after 3 week absence from therapy as noted in subjective and currently about 10 months s/p lumbar fusion surgery. Mild improvement from past therapy visits for walking tolerance but continues with moderate back pain symptoms, limited walking tolerance and left LE pain issues for which uncertain if radicular etiology. Given recent missed therapy time plan resume/continue therapy for a few more weeks to see if further progress can be achieved. Complicating factors include chronic pain history, recent hand surgery with impact on ability for gripping bands/weights with PT, comorbidities with surgical history and continued tobacco use so slower progress expected. Regarding left LE symptoms if these persist recommend follow up with Dr. Junius Roads for further assessment.    Personal Factors and Comorbidities  Past/Current Experience;Time since onset of injury/illness/exacerbation;Comorbidity 1;Comorbidity 2    Comorbidities  2 lumbar surgeries, 2 cervical surgeries    Examination-Activity Limitations  Bed Mobility;Carry;Lift;Stand;Locomotion Level     Examination-Participation Restrictions  Community Activity;Laundry;Shop;Cleaning    Stability/Clinical Decision Making  Evolving/Moderate complexity    Rehab Potential  Good    PT Frequency  2x / week    PT Duration  4 weeks    PT Treatment/Interventions  Electrical Stimulation;Iontophoresis 1m/ml Dexamethasone;Moist Heat;Therapeutic exercise;Therapeutic activities;Patient/family education;Passive range of motion;Dry needling;Manual techniques;Taping    PT Next Visit Plan  continue/progress gentle exercises for ROM, core strengthening, gentle stretches, manual as needed, consider dry needling right lumbar longissimus and glut region, modalities prn for pain  PT Home Exercise Plan  PPT, SKTC, hip bridge, hip adduction isometric, clamshell, hamstring stretch    Consulted and Agree with Plan of Care  Patient       Patient will benefit from skilled therapeutic intervention in order to improve the following deficits and impairments:  Pain, Increased muscle spasms, Decreased strength, Decreased activity tolerance, Difficulty walking  Visit Diagnosis: Chronic bilateral low back pain with left-sided sciatica  Abnormal posture  Muscle spasm of back     Problem List Patient Active Problem List   Diagnosis Date Noted  . 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 Hemorrhoids 01/21/2019  . Essential hypertension 01/21/2019  . Serum calcium elevated 01/21/2019  . Elevated cholesterol 01/19/2019  . Diabetes mellitus ([place]) 01/19/2019  . Trigger ring finger of left hand 01/11/2019  . Trigger finger of right thumb 01/11/2019  . 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 Acute bronchitis 08/08/2015  . Leukocytosis 08/08/2015  . Hyperglycemia 08/08/2015  . Tobacco abuse 08/08/2015  . 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 BACTERIAL PNEUMONIA, RIGHT LOWER LOBE 04/27/2008  . LUMP OR MASS IN BREAST 04/27/2008  . COUGH 04/27/2008    [name], PT, DPT 07/14/19 2:10 PM  [facility] [address] [place], [place], [address] Phone: [phone]   Fax:  [phone]  Name: Alisha Espinoza MRN: [id number] Date of Birth: Aug 16, 1965

## 2019-07-19 ENCOUNTER — Other Ambulatory Visit: Payer: Self-pay

## 2019-07-19 ENCOUNTER — Ambulatory Visit: Payer: Medicaid Other | Admitting: Physical Therapy

## 2019-07-19 ENCOUNTER — Encounter: Payer: Self-pay | Admitting: Physical Therapy

## 2019-07-19 DIAGNOSIS — R293 Abnormal posture: Secondary | ICD-10-CM

## 2019-07-19 DIAGNOSIS — M62830 Muscle spasm of back: Secondary | ICD-10-CM

## 2019-07-19 DIAGNOSIS — G8929 Other chronic pain: Secondary | ICD-10-CM

## 2019-07-19 DIAGNOSIS — M5442 Lumbago with sciatica, left side: Secondary | ICD-10-CM

## 2019-07-19 NOTE — Therapy (Signed)
[place], [place], [address] Phone: [phone]   Fax:  [phone]  Physical Therapy Treatment  Patient Details  Name: Alisha Espinoza MRN: [id number] Date of Birth: Nov 02, 1965 Referring Provider (PT): Eunice Blase , MD    Encounter Date: 07/19/2019  PT End of Session - 07/19/19 1404    Visit Number  10    Number of Visits  17    Date for PT Re-Evaluation  08/18/19    Authorization Type  MCD    PT Start Time  4332    PT Stop Time  1423   no charge due to authorization still not received   PT Time Calculation (min)  51 min    Activity Tolerance  Patient limited by pain    Behavior During Therapy  [facility] for tasks assessed/performed       Past Medical History:  Diagnosis Date  . Bronchitis   . Diabetes mellitus ([place]) 01/19/2019  . Dyspnea   . Heart murmur    resolved "closed by age 52."   . Hyperlipidemia   . Hypertension   . Seasonal allergies     Past Surgical History:  Procedure Laterality Date  . ABDOMINAL HYSTERECTOMY  2000  . ANTERIOR CERVICAL DECOMP/DISCECTOMY FUSION  03/19/2012   Procedure: ANTERIOR CERVICAL DECOMPRESSION/DISCECTOMY FUSION 1 LEVEL;  Surgeon: Melina Schools, MD;  Location: [place];  Service: Orthopedics;  Laterality: Left;  Total Disc Replacement C4-5  . ANTERIOR CERVICAL DECOMP/DISCECTOMY FUSION N/A 05/07/2012   Procedure: ANTERIOR CERVICAL DECOMPRESSION/DISCECTOMY FUSION 1 LEVEL/HARDWARE REMOVAL;  Surgeon: Melina Schools, MD;  Location: [place];  Service: Orthopedics;  Laterality: N/A;  REMOVAL OF CERVICAL DISC REPLACEMENT AND ACDF C4-5  . BACK SURGERY    . CARPAL TUNNEL RELEASE Right 09/28/2018   Procedure: CARPAL TUNNEL RELEASE;  Surgeon: Eustace Moore, MD;  Location: [place];  Service: Neurosurgery;  Laterality: Right;  CARPAL TUNNEL RELEASE  . CERVICAL FUSION  05/07/2012   Dr Rolena Infante  . COLONOSCOPY    . CYSTECTOMY     L wrist  . POLYPECTOMY    . ROTATOR CUFF REPAIR     Right  . TRIGGER FINGER RELEASE Left 05/03/2019   Procedure: LEFT LONG AND RING FINGER RELEASE TRIGGER FINGER/A-1 PULLEY;  Surgeon: Leanora Cover, MD;  Location: [place];  Service: Orthopedics;  Laterality: Left;  beir block  . TYMPANOSTOMY TUBE PLACEMENT      There were no vitals filed for this visit.  Subjective Assessment - 07/19/19 1336    Subjective  "Took a pain pill this morning." Moderate right-sided LBP this PM 5/10. Pt. continues with OT as well for hand s/p trigger finger release.    Pertinent History  L3-4 L4-5  fusion.       Cervical surgery x 2  one a fusion   left trigger finger surgery    Currently in Pain?  Yes    Pain Score  5     Pain Location  Back    Pain Orientation  Lower;Right    Pain Descriptors / Indicators  Aching;Sharp    Pain Type  Chronic pain    Pain Onset  More than a month ago    Pain Frequency  Constant    Aggravating Factors   standing and walking    Pain Relieving Factors  ice/heat    Effect of Pain on Daily Activities  limits walking tolerance  [facility] Adult PT Treatment/Exercise - 07/19/19 0001      Lumbar Exercises: Stretches   Single Knee to Chest Stretch  Right;Left;3 reps;10 seconds    Lower Trunk Rotation Limitations  x 10 reps brief holds <5 sec emphasis left side for right lumbar stretch      Lumbar Exercises: Aerobic   Nustep  L3 x 5 min LE only      Lumbar Exercises: Standing   Functional Squats Limitations  mini squat at counter x 10 reps    Other Standing Lumbar Exercises  step ups 4 in. step at counter 1 UE support x 10 reps bilat.    Other Standing Lumbar Exercises  hip abd SLR with 2 lb. ankle weights stopped at 5 reps due to pt. c/o pain      Lumbar Exercises: Supine   Clam  15 reps    Clam Limitations  red band    Bent Knee Raise  10 reps    Bent Knee Raise Limitations  limited due to pain    Bridge  10 reps    Bridge Limitations  limited due to pain, legs on reversed  incline wedge      Moist Heat Therapy   Number Minutes Moist Heat  10 Minutes    Moist Heat Location  Lumbar Spine   in sitting     Manual Therapy   Soft tissue mobilization  STM right lumbar paraspinals in left sidelying             PT Education - 07/19/19 1405    Education Details  pain education, dry needling    Person(s) Educated  Patient    Methods  Explanation    Comprehension  Verbalized understanding       PT Short Term Goals - 07/14/19 1358      PT SHORT TERM GOAL #1   Title  She will be indpendent with initial HEP    Baseline  met    Time  2    Period  Weeks    Status  Achieved      PT SHORT TERM GOAL #2   Title  She will report less pain with walking in parking lot at home.    Baseline  previously noting some mild improvement but continues with limitations, continue goal    Time  2    Period  Weeks    Status  On-going        PT Long Term Goals - 07/14/19 1406      PT LONG TERM GOAL #1   Title  She will be indpendent with all HEP issued    Baseline  updates ongoing    Time  4    Period  Weeks    Status  On-going    Target Date  08/18/19      PT LONG TERM GOAL #2   Title  She will report pain with walking in community  decreased 50% or more with shopping    Baseline  goal ongoing    Time  4    Period  Weeks    Status  On-going    Target Date  08/18/19      PT LONG TERM GOAL #3   Title  She will report 50% increase in home tasks without pain    Baseline  goal ongoing    Time  4    Period  Weeks    Status  On-going    Target Date  08/18/19  PT LONG TERM GOAL #4   Title  She will report understanding of use of cold or heat and stretching to ease any increase pain    Time  4    Period  Weeks    Status  Achieved      PT LONG TERM GOAL #5   Title  Increase bilat. hip extension strength 1/2 MMT grade to improve ability for lifting for chores    Baseline  4/5    Time  4    Period  Weeks    Status  New    Target Date  08/18/19             Plan - 07/19/19 1342    Clinical Impression Statement  Minimized UE use during session due to hand pain (pt. addressing with OT). Discussed possible dry needling to address lumbar muscular pain-pt. wishes to hold off on this for today due to reluctance to try needling due to past injections but reports may consider at future tx. Poor exercise tolerance today due to pain particularly for standing exercises-discussed "hurt vs. harm" and importance of activity progression for improvement with therapy.    Personal Factors and Comorbidities  Past/Current Experience;Time since onset of injury/illness/exacerbation;Comorbidity 1;Comorbidity 2    Comorbidities  2 lumbar surgeries, 2 cervical surgeries    Examination-Activity Limitations  Bed Mobility;Carry;Lift;Stand;Locomotion Level    Examination-Participation Restrictions  Community Activity;Laundry;Shop;Cleaning    Stability/Clinical Decision Making  Evolving/Moderate complexity    Clinical Decision Making  Moderate    Rehab Potential  Fair    PT Frequency  2x / week    PT Duration  4 weeks    PT Treatment/Interventions  Electrical Stimulation;Iontophoresis 67m/ml Dexamethasone;Moist Heat;Therapeutic exercise;Therapeutic activities;Patient/family education;Passive range of motion;Dry needling;Manual techniques;Taping    PT Next Visit Plan  continue/progress gentle exercises for ROM, core strengthening, gentle stretches, manual as needed, possible trial dry needling right lumbar longissimus and glut region at future session with PT, modalities prn for pain    PT Home Exercise Plan  PPT, SKTC, hip bridge, hip adduction isometric, clamshell, hamstring stretch    Consulted and Agree with Plan of Care  Patient       Patient will benefit from skilled therapeutic intervention in order to improve the following deficits and impairments:  Pain, Increased muscle spasms, Decreased strength, Decreased activity tolerance, Difficulty walking  Visit  Diagnosis: Chronic bilateral low back pain with left-sided sciatica  Abnormal posture  Muscle spasm of back     Problem List Patient Active Problem List   Diagnosis Date Noted  . 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 Hemorrhoids 01/21/2019  . Essential hypertension 01/21/2019  . Serum calcium elevated 01/21/2019  . Elevated cholesterol 01/19/2019  . Diabetes mellitus ([place] 01/19/2019  . Trigger ring finger of left hand 01/11/2019  . Trigger finger of right thumb 01/11/2019  . 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 Acute bronchitis 08/08/2015  . Leukocytosis 08/08/2015  . Hyperglycemia 08/08/2015  . Tobacco abuse 08/08/2015  . 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 BACTERIAL PNEUMONIA, RIGHT LOWER LOBE 04/27/2008  . LUMP OR MASS IN BREAST 04/27/2008  . COUGH 04/27/2008    CBeaulah Dinning PT, DPT 07/19/19 6:02 PM  [facility] [address]Phone: [phone]  Fax:  [phone] Name: SYARLIN BREISCHMRN: [id number] of Birth: 07/15/1965-11-15

## 2019-07-20 ENCOUNTER — Telehealth: Payer: Self-pay | Admitting: Family Medicine

## 2019-07-20 MED ORDER — TRAMADOL HCL 50 MG PO TABS: 50.0000 mg | ORAL_TABLET | Freq: Two times a day (BID) | ORAL | 0 refills | Status: DC | PRN

## 2019-07-20 NOTE — Telephone Encounter (Signed)
Patient called.   She is requesting refill on her tramadol   Call back: [phone]

## 2019-07-20 NOTE — Telephone Encounter (Signed)
Sent!

## 2019-07-20 NOTE — Telephone Encounter (Signed)
I called and advised the patient. 

## 2019-07-20 NOTE — Telephone Encounter (Signed)
Please advise 

## 2019-07-21 ENCOUNTER — Ambulatory Visit (INDEPENDENT_AMBULATORY_CARE_PROVIDER_SITE_OTHER): Payer: Medicaid Other | Admitting: Gastroenterology

## 2019-07-21 ENCOUNTER — Encounter: Payer: Self-pay | Admitting: Gastroenterology

## 2019-07-21 VITALS — BP 112/60 | HR 94 | Temp 97.9°F | Ht 66.0 in | Wt 192.0 lb

## 2019-07-21 DIAGNOSIS — M6289 Other specified disorders of muscle: Secondary | ICD-10-CM

## 2019-07-21 DIAGNOSIS — K6289 Other specified diseases of anus and rectum: Secondary | ICD-10-CM | POA: Diagnosis not present

## 2019-07-21 DIAGNOSIS — K649 Unspecified hemorrhoids: Secondary | ICD-10-CM | POA: Diagnosis not present

## 2019-07-21 NOTE — Patient Instructions (Signed)
If you are age 54 or older, your body mass index should be between 23-30. Your Body mass index is 30.99 kg/m. If this is out of the aforementioned range listed, please consider follow up with your Primary Care Provider.  If you are age 67 or younger, your body mass index should be between 19-25. Your Body mass index is 30.99 kg/m. If this is out of the aformentioned range listed, please consider follow up with your Primary Care Provider.   We will refer you to Pelvic Floor Physical Therapy.  It may take 1-2 weeks before you are contacted. If you have not heard anything regarding an appointment in 2 weeks please contact our office and we will follow up with their office.   We are giving you samples today of   Calmol 4 hemorrhoidal suppositories  Recticare cream  I appreciate the opportunity to care for you. Thank you for choosing me and [facility],  Dr. Harl Bowie

## 2019-07-21 NOTE — Progress Notes (Signed)
Alisha Espinoza    [id number]    13-Dec-1965  Primary Care Physician:Robinson, Blanche East, MD  Referring Physician: Drue Flirt, MD [phone] [place] [address],  [place] [address]   Chief complaint:  Hemorrhoids, Rectal pain  HPI:  54 year old female here for follow-up visit with complaints of persistent burning sensation and pain in the anal canal.  It is worse before and right after bowel movement.  She does not wake up with rectal pain or spasm in the night.    Remittent rectal bleeding.   She is afraid to have bowel movement.  She has been using rectal nitroglycerin with some improvement.  Constipation improved with Linzess, she has loose watery bowel movement occasionally but most of the time it is soft.  She is taking over-the-counter laxatives on some days as she feels Linzess makes her go to the bathroom more  Flexible sigmoidoscopy March 03, 2019: Internal and external hemorrhoids, one of the hemorrhoids appear thrombosed otherwise unremarkable exam.  Colonoscopy Aug 27, 2017: Multiple adenomatous sessile polyps removed ranging in size from 4 to 18 mm.  Diverticulosis and internal hemorrhoids  Colonoscopy September 18, 2018: Left-sided diverticulosis, internal hemorrhoids and 1 subcentimeter sessile polyp, tubular adenoma and 1 hyperplastic polyp was removed.  She was seen by [facility] surgery on 02/15/2019: She was prescribed hydrocortisone suppositories for hemorrhoids. She is scheduled for hemorrhoidectomy May 3    Outpatient Encounter Medications as of 07/21/2019  Medication Sig  . albuterol (PROVENTIL HFA;VENTOLIN HFA) 108 (90 Base) MCG/ACT inhaler Inhale 1-2 puffs into the lungs every 4 (four) hours as needed for wheezing or shortness of breath (or coughing).  Marland Kitchen azithromycin (ZITHROMAX) 250 MG tablet Take 1 tablet (250 mg total) by mouth daily. Take first 2 tablets together, then 1 every day until finished. (Patient not taking: Reported on  06/30/2019)  . Blood Glucose Monitoring Suppl (CONTOUR NEXT MONITOR) w/Device KIT 1 each by Does not apply route daily. Use to test blood sugars 1-2 times daily.  . celecoxib (CELEBREX) 200 MG capsule Take 1 capsule (200 mg total) by mouth 2 (two) times daily as needed.  Marland Kitchen dexamethasone (DECADRON) 6 MG tablet Take 1 tablet (6 mg total) by mouth daily. (Patient not taking: Reported on 06/30/2019)  . gabapentin (NEURONTIN) 100 MG capsule 1 PO q HS, may increase to 3 PO q HS if needed/tolerated (Patient taking differently: Take 100 mg by mouth daily. )  . glucose blood (CONTOUR NEXT TEST) test strip Use to test 1-2 times daily.  Marland Kitchen HYDROcodone-acetaminophen (NORCO) 5-325 MG tablet 1-2 tabs po q6 hours prn pain (Patient not taking: Reported on 06/30/2019)  . hydrocortisone (ANUCORT-HC) 25 MG suppository PLACE 1 SUPPOSITORY RECTALLY ONCE A DAY FOR 1 WEEK AS NEEDED (Patient not taking: Reported on 07/02/2019)  . hydrocortisone (ANUSOL-HC) 2.5 % rectal cream Place 1 application rectally 2 (two) times daily.  . Lidocaine-Hydrocortisone Ace 2.8-0.55 % GEL Place 1 application rectally 4 (four) times daily as needed.  Marland Kitchen LINZESS 290 MCG CAPS capsule TAKE 1 CAPSULE(290 MCG) BY MOUTH DAILY BEFORE BREAKFAST (Patient taking differently: Take 290 mcg by mouth daily before breakfast. )  . metFORMIN (GLUCOPHAGE) 500 MG tablet Take 1 tablet (500 mg total) by mouth 2 (two) times daily with a meal.  . Microlet Lancets MISC Use to test blood sugars 1-2 times daily.  . simvastatin (ZOCOR) 20 MG tablet Take 20 mg by mouth daily.  Marland Kitchen tiZANidine (ZANAFLEX) 4 MG  tablet Take 1 tablet by mouth every 8 (eight) hours as needed for muscle spasms.   . traMADol (ULTRAM) 50 MG tablet Take 1 tablet (50 mg total) by mouth 2 (two) times daily as needed.  . TRELEGY ELLIPTA 100-62.5-25 MCG/INH AEPB Inhale 1 puff into the lungs daily.  Marland Kitchen tretinoin (RETIN-A) 0.1 % cream Apply 1 application topically at bedtime.   . valsartan-hydrochlorothiazide  (DIOVAN-HCT) 160-12.5 MG tablet Take 1 tablet by mouth daily.   No facility-administered encounter medications on file as of 07/21/2019.    Allergies as of 07/21/2019 - Review Complete 07/02/2019  Allergen Reaction Noted  . Meloxicam Hives 05/16/2011  . Mobic [meloxicam]  05/16/2011    Past Medical History:  Diagnosis Date  . Bronchitis   . Diabetes mellitus ([place]) 01/19/2019  . Dyspnea   . Heart murmur    resolved "closed by age 48."   . Hyperlipidemia   . Hypertension   . Seasonal allergies     Past Surgical History:  Procedure Laterality Date  . ABDOMINAL HYSTERECTOMY  2000  . ANTERIOR CERVICAL DECOMP/DISCECTOMY FUSION  03/19/2012   Procedure: ANTERIOR CERVICAL DECOMPRESSION/DISCECTOMY FUSION 1 LEVEL;  Surgeon: Melina Schools, MD;  Location: [facility];  Service: Orthopedics;  Laterality: Left;  Total Disc Replacement C4-5  . ANTERIOR CERVICAL DECOMP/DISCECTOMY FUSION N/A 05/07/2012   Procedure: ANTERIOR CERVICAL DECOMPRESSION/DISCECTOMY FUSION 1 LEVEL/HARDWARE REMOVAL;  Surgeon: Melina Schools, MD;  Location: [facility];  Service: Orthopedics;  Laterality: N/A;  REMOVAL OF CERVICAL DISC REPLACEMENT AND ACDF C4-5  . BACK SURGERY    . CARPAL TUNNEL RELEASE Right 09/28/2018   Procedure: CARPAL TUNNEL RELEASE;  Surgeon: Eustace Moore, MD;  Location: [facility];  Service: Neurosurgery;  Laterality: Right;  CARPAL TUNNEL RELEASE  . CERVICAL FUSION  05/07/2012   Dr Rolena Infante  . COLONOSCOPY    . CYSTECTOMY     L wrist  . POLYPECTOMY    . ROTATOR CUFF REPAIR     Right  . TRIGGER FINGER RELEASE Left 05/03/2019   Procedure: LEFT LONG AND RING FINGER RELEASE TRIGGER FINGER/A-1 PULLEY;  Surgeon: Leanora Cover, MD;  Location: [facility];  Service: Orthopedics;  Laterality: Left;  beir block  . TYMPANOSTOMY TUBE PLACEMENT      Family History  Problem Relation Age of Onset  . Diabetes Mother   . Hypertension Father   . Pancreatic cancer Sister   . Colon cancer Neg Hx   . Rectal cancer Neg  Hx   . Stomach cancer Neg Hx   . Colon polyps Neg Hx   . Esophageal cancer Neg Hx     Social History   Socioeconomic History  . Marital status: Divorced    Spouse name: Not on file  . Number of children: Not on file  . Years of education: Not on file  . Highest education level: Not on file  Occupational History  . Not on file  Tobacco Use  . Smoking status: Current Every Day Smoker    Packs/day: 0.50    Years: 30.00    Pack years: 15.00    Types: Cigarettes  . Smokeless tobacco: Never Used  . Tobacco comment: 10cigs/day  Substance and Sexual Activity  . Alcohol use: Not Currently  . Drug use: No  . Sexual activity: Yes    Birth control/protection: Surgical    Comment: Hysterectomy  Other Topics Concern  . Not on file  Social History Narrative  . Not on file   Social Determinants of Health  Financial Resource Strain:   . Difficulty of Paying Living Expenses:   Food Insecurity:   . Worried About Charity fundraiser in the Last Year:   . Arboriculturist in the Last Year:   Transportation Needs:   . Film/video editor (Medical):   Marland Kitchen Lack of Transportation (Non-Medical):   Physical Activity:   . Days of Exercise per Week:   . Minutes of Exercise per Session:   Stress:   . Feeling of Stress :   Social Connections:   . Frequency of Communication with Friends and Family:   . Frequency of Social Gatherings with Friends and Family:   . Attends Religious Services:   . Active Member of Clubs or Organizations:   . Attends Archivist Meetings:   Marland Kitchen Marital Status:   Intimate Partner Violence:   . Fear of Current or Ex-Partner:   . Emotionally Abused:   Marland Kitchen Physically Abused:   . Sexually Abused:       Review of systems: All other review of systems negative except as mentioned in the HPI.   Physical Exam: Vitals:   07/21/19 1048  BP: 112/60  Pulse: 94  Temp: 97.9 F (36.6 C)  SpO2: 98%   Body mass index is 30.99 kg/m. Gen:      No acute  distress Neuro: alert and oriented x 3 Psych: normal mood and affect Patient deferred rectal exam  Data Reviewed:  Reviewed labs, radiology imaging, old records and pertinent past GI work up   Assessment and Plan/Recommendations:  54 year old female with history of chronic idiopathic constipation with anorectal pain and burning sensation, symptomatic hemorrhoids.  Unclear etiology for significant anorectal pain and burning sensation other than symptomatic hemorrhoids ?  Functional pain  Continue RectiCare cream, small pea-sized amount per rectum as needed for symptom relief Patient given samples for Carmol hemorrhoidal suppositories She is scheduled for hemorrhoidectomy by [facility] next month Patient is aware that she will have postop pain and it can take her few weeks to recover from the surgery, she wants to proceed with the surgery.  Pelvic floor dysfunction: We will refer to pelvic floor physical therapy for biofeedback  Chronic idiopathic constipation: Continue Linzess Increase dietary fiber and water intake  Return as needed  This visit required 35 minutes of patient care (this includes precharting, chart review, review of results, face-to-face time used for counseling as well as treatment plan and follow-up. The patient was provided an opportunity to ask questions and all were answered. The patient agreed with the plan and demonstrated an understanding of the instructions.  Damaris Hippo , MD    CC: Drue Flirt, MD

## 2019-07-22 NOTE — Therapy (Signed)
Alisha Espinoza, [place], [address] Phone: [phone]   Fax:  [phone]  Physical Therapy Treatment/Discharge  Patient Details  Name: Alisha Espinoza MRN: [id number] Date of Birth: 12-27-1965 Referring Provider (PT): Eunice Blase , MD    Encounter Date: 07/19/2019    Past Medical History:  Diagnosis Date  . Bronchitis   . Diabetes mellitus ([place]) 01/19/2019  . Dyspnea   . Heart murmur    resolved "closed by age 54."   . Hyperlipidemia   . Hypertension   . Seasonal allergies     Past Surgical History:  Procedure Laterality Date  . ABDOMINAL HYSTERECTOMY  2000  . ANTERIOR CERVICAL DECOMP/DISCECTOMY FUSION  03/19/2012   Procedure: ANTERIOR CERVICAL DECOMPRESSION/DISCECTOMY FUSION 1 LEVEL;  Surgeon: Melina Schools, MD;  Location: [facility];  Service: Orthopedics;  Laterality: Left;  Total Disc Replacement C4-5  . ANTERIOR CERVICAL DECOMP/DISCECTOMY FUSION N/A 05/07/2012   Procedure: ANTERIOR CERVICAL DECOMPRESSION/DISCECTOMY FUSION 1 LEVEL/HARDWARE REMOVAL;  Surgeon: Melina Schools, MD;  Location: [facility];  Service: Orthopedics;  Laterality: N/A;  REMOVAL OF CERVICAL DISC REPLACEMENT AND ACDF C4-5  . BACK SURGERY    . CARPAL TUNNEL RELEASE Right 09/28/2018   Procedure: CARPAL TUNNEL RELEASE;  Surgeon: Eustace Moore, MD;  Location: [facility];  Service: Neurosurgery;  Laterality: Right;  CARPAL TUNNEL RELEASE  . CERVICAL FUSION  05/07/2012   Dr Rolena Infante  . COLONOSCOPY    . CYSTECTOMY     L wrist  . POLYPECTOMY    . ROTATOR CUFF REPAIR     Right  . TRIGGER FINGER RELEASE Left 05/03/2019   Procedure: LEFT LONG AND RING FINGER RELEASE TRIGGER FINGER/A-1 PULLEY;  Surgeon: Leanora Cover, MD;  Location: [facility];  Service: Orthopedics;  Laterality: Left;  beir block  . TYMPANOSTOMY TUBE PLACEMENT      There were no vitals filed for this visit.                              PT Short  Term Goals - 07/14/19 1358      PT SHORT TERM GOAL #1   Title  She will be indpendent with initial HEP    Baseline  met    Time  2    Period  Weeks    Status  Achieved      PT SHORT TERM GOAL #2   Title  She will report less pain with walking in parking lot at home.    Baseline  previously noting some mild improvement but continues with limitations, continue goal    Time  2    Period  Weeks    Status  On-going        PT Long Term Goals - 07/14/19 1406      PT LONG TERM GOAL #1   Title  She will be indpendent with all HEP issued    Baseline  updates ongoing    Time  4    Period  Weeks    Status  On-going    Target Date  08/18/19      PT LONG TERM GOAL #2   Title  She will report pain with walking in community  decreased 50% or more with shopping    Baseline  goal ongoing    Time  4    Period  Weeks    Status  On-going    Target Date  08/18/19      PT LONG TERM GOAL #3   Title  She will report 50% increase in home tasks without pain    Baseline  goal ongoing    Time  4    Period  Weeks    Status  On-going    Target Date  08/18/19      PT LONG TERM GOAL #4   Title  She will report understanding of use of cold or heat and stretching to ease any increase pain    Time  4    Period  Weeks    Status  Achieved      PT LONG TERM GOAL #5   Title  Increase bilat. hip extension strength 1/2 MMT grade to improve ability for lifting for chores    Baseline  4/5    Time  4    Period  Weeks    Status  New    Target Date  08/18/19              Patient will benefit from skilled therapeutic intervention in order to improve the following deficits and impairments:  Pain, Increased muscle spasms, Decreased strength, Decreased activity tolerance, Difficulty walking  Visit Diagnosis: Chronic bilateral low back pain with left-sided sciatica  Abnormal posture  Muscle spasm of back     Problem List Patient Active Problem List   Diagnosis Date Noted  . 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 Hemorrhoids 01/21/2019  . Essential hypertension 01/21/2019  . Serum calcium elevated 01/21/2019  . Elevated cholesterol 01/19/2019  . Diabetes mellitus ([place]) 01/19/2019  . Trigger ring finger of left hand 01/11/2019  . Trigger finger of right thumb 01/11/2019  . 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 Acute bronchitis 08/08/2015  . Leukocytosis 08/08/2015  . Hyperglycemia 08/08/2015  . Tobacco abuse 08/08/2015  . 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 BACTERIAL PNEUMONIA, RIGHT LOWER LOBE 04/27/2008  . LUMP OR MASS IN BREAST 04/27/2008  . COUGH 04/27/2008         PHYSICAL THERAPY DISCHARGE SUMMARY  Visits from Start of Care: 10  Current functional level related to goals / functional outcomes: Fair progress for back with continued pain. Patient is pending therapy evaluation next month for pelvic floor therapy-discussed status with her and she is in agreement with plans to d/c formal therapy for back and continue with HEP to conserve visits for pelvic floor therapy as well as OT for hand.   Remaining deficits: LBP and core + hip weakness   Education / Equipment: HEP, POC Plan: Patient agrees to discharge.  Patient goals were partially met. Patient is being discharged due to the patient's request.  ?????           [name], PT, DPT 07/22/19 1:31 PM     [facility] [address] [place], [place], [address] Phone: [phone]   Fax:  [phone]  Name: Alisha Espinoza MRN: [id number] Date of Birth: 12-27-1965

## 2019-07-23 ENCOUNTER — Ambulatory Visit: Payer: Medicaid Other | Admitting: Physical Therapy

## 2019-07-26 ENCOUNTER — Encounter: Payer: Medicaid Other | Admitting: Physical Therapy

## 2019-07-27 ENCOUNTER — Other Ambulatory Visit: Payer: Self-pay | Admitting: Gastroenterology

## 2019-07-28 ENCOUNTER — Encounter: Payer: Medicaid Other | Admitting: Physical Therapy

## 2019-08-02 ENCOUNTER — Encounter: Payer: Medicaid Other | Admitting: Physical Therapy

## 2019-08-04 ENCOUNTER — Encounter: Payer: Medicaid Other | Admitting: Physical Therapy

## 2019-08-04 ENCOUNTER — Other Ambulatory Visit: Payer: Self-pay | Admitting: Family Medicine

## 2019-08-04 ENCOUNTER — Telehealth: Payer: Self-pay | Admitting: Family Medicine

## 2019-08-04 DIAGNOSIS — M5440 Lumbago with sciatica, unspecified side: Secondary | ICD-10-CM

## 2019-08-04 DIAGNOSIS — G8929 Other chronic pain: Secondary | ICD-10-CM

## 2019-08-04 MED ORDER — TRAMADOL HCL 50 MG PO TABS: 50.0000 mg | ORAL_TABLET | Freq: Two times a day (BID) | ORAL | 0 refills | Status: DC | PRN

## 2019-08-04 NOTE — Telephone Encounter (Signed)
I advised the patient nothing stronger can be prescribed. She has run out of the Tramadol, as she had to take it more often than bid due to the pain. Can she get a refill on this?

## 2019-08-04 NOTE — Telephone Encounter (Signed)
I can't prescribe stronger meds, but I will request referral to pain clinic.

## 2019-08-04 NOTE — Telephone Encounter (Signed)
Patient called.   She is requesting a refill on her pain medicine but is wanting to be given something stronger than the tramadol.   Call back: [phone]

## 2019-08-04 NOTE — Telephone Encounter (Signed)
Please advise 

## 2019-08-04 NOTE — Telephone Encounter (Signed)
Rx sent. She needs to take it as directed.

## 2019-08-04 NOTE — Telephone Encounter (Signed)
I called and advised the patient of the refill on the Tramadol and that she needs to take it as written, 1 bid prn. She said she would like to be referred to pain management, as she is having more problems than just her back.

## 2019-08-04 NOTE — Addendum Note (Signed)
Addended by: Hortencia Pilar on: 08/04/2019 05:09 PM   Modules accepted: Orders

## 2019-08-05 NOTE — Telephone Encounter (Signed)
Orders have been placed.

## 2019-08-06 ENCOUNTER — Encounter: Payer: Self-pay | Admitting: Physical Medicine and Rehabilitation

## 2019-08-09 ENCOUNTER — Encounter: Payer: Medicaid Other | Admitting: Physical Therapy

## 2019-08-11 ENCOUNTER — Encounter: Payer: Medicaid Other | Admitting: Physical Therapy

## 2019-08-13 ENCOUNTER — Ambulatory Visit: Payer: Medicaid Other | Attending: Gastroenterology | Admitting: Physical Therapy

## 2019-08-13 ENCOUNTER — Other Ambulatory Visit: Payer: Self-pay | Admitting: Family Medicine

## 2019-08-13 ENCOUNTER — Encounter (INDEPENDENT_AMBULATORY_CARE_PROVIDER_SITE_OTHER): Payer: Self-pay

## 2019-08-13 ENCOUNTER — Other Ambulatory Visit: Payer: Self-pay

## 2019-08-13 ENCOUNTER — Encounter: Payer: Self-pay | Admitting: Physical Therapy

## 2019-08-13 DIAGNOSIS — R278 Other lack of coordination: Secondary | ICD-10-CM | POA: Insufficient documentation

## 2019-08-13 DIAGNOSIS — M62838 Other muscle spasm: Secondary | ICD-10-CM | POA: Insufficient documentation

## 2019-08-13 DIAGNOSIS — R29810 Facial weakness: Secondary | ICD-10-CM | POA: Insufficient documentation

## 2019-08-13 NOTE — Therapy (Addendum)
[facility] [address] [place], [place], [address] Phone: [phone]   Fax:  [phone]  Physical Therapy Evaluation  Patient Details  Name: Alisha Espinoza MRN: [id number] Date of Birth: [birth year] Referring Provider (PT): Mauri Pole, MD   Encounter Date: 08/13/2019  PT End of Session - 08/13/19 0758    Visit Number  1    Date for PT Re-Evaluation  11/05/19    Authorization Type  MCD    Authorization - Visit Number  0    Authorization - Number of Visits  3    PT Start Time  0758    PT Stop Time  0843    PT Time Calculation (min)  45 min    Activity Tolerance  Patient limited by pain;Patient tolerated treatment well    Behavior During Therapy  [facility] for tasks assessed/performed       Past Medical History:  Diagnosis Date  . Bronchitis   . Diabetes mellitus ([place]) 01/19/2019  . Dyspnea   . Heart murmur    resolved "closed by age 23."   . Hyperlipidemia   . Hypertension   . Seasonal allergies     Past Surgical History:  Procedure Laterality Date  . ABDOMINAL HYSTERECTOMY  2000  . ANTERIOR CERVICAL DECOMP/DISCECTOMY FUSION  03/19/2012   Procedure: ANTERIOR CERVICAL DECOMPRESSION/DISCECTOMY FUSION 1 LEVEL;  Surgeon: Melina Schools, MD;  Location: [place];  Service: Orthopedics;  Laterality: Left;  Total Disc Replacement C4-5  . ANTERIOR CERVICAL DECOMP/DISCECTOMY FUSION N/A 05/07/2012   Procedure: ANTERIOR CERVICAL DECOMPRESSION/DISCECTOMY FUSION 1 LEVEL/HARDWARE REMOVAL;  Surgeon: Melina Schools, MD;  Location: [place];  Service: Orthopedics;  Laterality: N/A;  REMOVAL OF CERVICAL DISC REPLACEMENT AND ACDF C4-5  . BACK SURGERY    . CARPAL TUNNEL RELEASE Right 09/28/2018   Procedure: CARPAL TUNNEL RELEASE;  Surgeon: Eustace Moore, MD;  Location: [place];  Service: Neurosurgery;  Laterality: Right;  CARPAL TUNNEL RELEASE  . CERVICAL FUSION  05/07/2012   Dr Rolena Infante  . COLONOSCOPY    . CYSTECTOMY     L wrist   . POLYPECTOMY    . ROTATOR CUFF REPAIR     Right  . TRIGGER FINGER RELEASE Left 05/03/2019   Procedure: LEFT LONG AND RING FINGER RELEASE TRIGGER FINGER/A-1 PULLEY;  Surgeon: Leanora Cover, MD;  Location: [place];  Service: Orthopedics;  Laterality: Left;  beir block  . TYMPANOSTOMY TUBE PLACEMENT      There were no vitals filed for this visit.   Subjective Assessment - 08/13/19 0801    Subjective  Pt states she has been having rectal pain since about October of last year.  Pt was recently in PT for low back pain and states that comes and goes.  Currently using dilitiazem (sp?) cream.  S    Patient Stated Goals  be able to have more solid stool and be able to pass it without pain    Currently in Pain?  Yes    Pain Score  1    having BM is 9/10   Pain Location  Perineum    Pain Orientation  Mid    Pain Descriptors / Indicators  Sore;Burning    Pain Type  Chronic pain    Aggravating Factors   sitting and pressure, having a BM    Pain Relieving Factors  standing and walking, taking pressure off the area    Multiple Pain Sites  No  [facility] PT Assessment - 08/16/19 0001      Assessment   Medical Diagnosis  M62.89 (ICD-10-CM) - Pelvic floor dysfunction    Referring Provider (PT)  Mauri Pole, MD    Onset Date/Surgical Date  --   oct 2020   Prior Therapy  Not for pelvic floor      Precautions   Precautions  None      Balance Screen   Has the patient fallen in the past 6 months  No      [place] residence    Living Arrangements  Alone      Prior Function   Level of Independence  Independent      Cognition   Overall Cognitive Status  Within Functional Limits for tasks assessed      Observation/Other Assessments   Observations  unable to coordinate TrA activation without holding breath      AROM   Lumbar Flexion  90    Lumbar Extension  20      PROM   Overall PROM Comments  Lt hip rotation 25%  limited      Strength   Right Hip Extension  4/5    Left Hip Extension  4/5      Flexibility   Soft Tissue Assessment /Muscle Length  yes    Hamstrings  80 degrees SLR bilateral      Palpation   Palpation comment  lumbar tight paraspinals; tight TFL Rt>Lt      Special Tests    Special Tests  --      Ambulation/Gait   Gait Pattern  Decreased stride length   decreased hip extension                 Objective measurements completed on examination: See above findings.    Pelvic Floor Special Questions - 08/16/19 0001    Prior Pregnancies  No    Currently Sexually Active  No    Urinary Leakage  No    Urinary urgency  No    Fecal incontinence  No   urge with laxatives   Fluid intake  6 bottles of water    External Palpation  TTP anterior pelvic, min excursion palpated with bearing down; 1-2 sec holding contraction    Exam Type  Deferred                 PT Short Term Goals - 08/16/19 1240      PT SHORT TERM GOAL #1   Title  She will be indpendent with initial HEP    Time  4    Period  Weeks    Status  New    [place] Date  09/10/19      PT SHORT TERM GOAL #2   Title  She will report at least 25% less pain with a BM    Time  4    Period  Weeks    Status  New    Target Date  09/10/19      PT SHORT TERM GOAL #3   Title  Pt will report BM is more normal consistency of 3-5 on bristol stool chart    Time  4    Period  Weeks    Status  New    Target Date  09/10/19        PT Long Term Goals - 08/16/19 1247      PT LONG TERM GOAL #1   Title  She will be indpendent with all HEP issued    Time  12    Period  Weeks    Status  New    Target Date  11/05/19      PT LONG TERM GOAL #2   Title  She will report pain with BM decreased 50% or more    Time  12    Period  Weeks    Status  New    Target Date  11/05/19      PT LONG TERM GOAL #3   Title  Pt will be able to perform breathing techniques and bulge pelvic floor so she can improved muscle  elasticity for better bowel movements    Time  12    Period  Weeks    Status  New    Target Date  11/05/19      PT LONG TERM GOAL #4   Title  Pt will be able to perform transfers and lifting techniques correctly without using valsalva maneuver    Time  12    Period  Weeks    Status  New    Target Date  11/05/19      PT LONG TERM GOAL #5   Title  Increase bilat. hip extension strength to 5/5 MMT to improve ability for lifting for chores    Baseline  4/5    Time  12    Period  Weeks    Status  New    Target Date  11/05/19             Plan - 08/16/19 1321    Clinical Impression Statement  Pt presents to skilled PT today due to pain with bowel movements and rectal pain. Pt has history of chronic low back pain that she was recently being treated for but rectal pain is worse at this time.  Pt has tension and weakness as mentioned above.  Pt has decreased excursion of pelvic floor muscles and TTP of anterior pelvic floor.  Pt did not tolerate internal exm today due to pain.  Very minimal movement of pelvic floor.  Pt has tight lumbar paraspinals and decreased lumbar flexion    Personal Factors and Comorbidities  Past/Current Experience;Time since onset of injury/illness/exacerbation;Comorbidity 3+    Comorbidities  2 lumbar surgeries, 2 cervical surgeries; Lt diverticulosis; polyps; rectal pain and bleeding internal and external hemorrhoids hx of hysterectomy; cerivcal fusion and back surgery    Examination-Activity Limitations  Bed Mobility;Carry;Lift;Stand;Locomotion Level;Continence;Toileting    Examination-Participation Restrictions  Community Activity;Laundry;Shop;Cleaning    Stability/Clinical Decision Making  Evolving/Moderate complexity    Clinical Decision Making  Moderate    Rehab Potential  Good    PT Frequency  2x / week    PT Duration  12 weeks    PT Treatment/Interventions  Electrical Stimulation;Iontophoresis 4mg /ml Dexamethasone;Moist Heat;Therapeutic  exercise;Therapeutic activities;Patient/family education;Passive range of motion;Dry needling;Manual techniques;Taping;ADLs/Self Care Home Management;Biofeedback;Cryotherapy;Neuromuscular re-education    PT Next Visit Plan  breathing and bulging, internal assessment if tolerated, toileting techniques; DN to lumbar paraspinals    Consulted and Agree with Plan of Care  Patient       Patient will benefit from skilled therapeutic intervention in order to improve the following deficits and impairments:  Pain, Increased muscle spasms, Decreased strength, Decreased activity tolerance, Difficulty walking, Impaired tone, Decreased coordination  Visit Diagnosis: Other muscle spasm  Other lack of coordination  Facial weakness     Problem List Patient Active Problem List   Diagnosis Date Noted  .  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 Hemorrhoids 01/21/2019  . Essential hypertension 01/21/2019  . Serum calcium elevated 01/21/2019  . Elevated cholesterol 01/19/2019  . Diabetes mellitus ([place]) 01/19/2019  . Trigger ring finger of left hand 01/11/2019  . Trigger finger of right thumb 01/11/2019  . 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 Acute bronchitis 08/08/2015  . Leukocytosis 08/08/2015  . Hyperglycemia 08/08/2015  . Tobacco abuse 08/08/2015  . 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 BACTERIAL PNEUMONIA, RIGHT LOWER LOBE 04/27/2008  . LUMP OR MASS IN BREAST 04/27/2008  . COUGH 04/27/2008    Jule Ser, PT 08/16/2019, 2:03 PM  [facility] [address], [place], [place], [address] Phone: [phone]   Fax:  [phone]  Name: Alisha Espinoza MRN: [id number] Date of Birth: 1965/06/09

## 2019-08-18 ENCOUNTER — Telehealth: Payer: Self-pay | Admitting: Family Medicine

## 2019-08-18 MED ORDER — TRAMADOL HCL 50 MG PO TABS: 50.0000 mg | ORAL_TABLET | Freq: Two times a day (BID) | ORAL | 0 refills | Status: DC | PRN

## 2019-08-18 NOTE — Telephone Encounter (Signed)
Left voice mail message that her medication was refilled.

## 2019-08-18 NOTE — Telephone Encounter (Signed)
Please advise. She has an appointment here on 08/26/19 (with [facility]).

## 2019-08-18 NOTE — Telephone Encounter (Signed)
Patient called requesting a refill on tramadol. Please send to pharmacy on file. Patient phone number is [phone].

## 2019-08-18 NOTE — Telephone Encounter (Signed)
Sent!

## 2019-08-19 ENCOUNTER — Other Ambulatory Visit: Payer: Self-pay | Admitting: Orthopedic Surgery

## 2019-08-24 ENCOUNTER — Other Ambulatory Visit: Payer: Self-pay

## 2019-08-24 ENCOUNTER — Encounter: Payer: Self-pay | Admitting: Physical Medicine and Rehabilitation

## 2019-08-24 ENCOUNTER — Encounter
Payer: Medicaid Other | Attending: Physical Medicine and Rehabilitation | Admitting: Physical Medicine and Rehabilitation

## 2019-08-24 VITALS — BP 119/80 | HR 72 | Temp 96.6°F | Ht 66.0 in | Wt 195.6 lb

## 2019-08-24 DIAGNOSIS — M5417 Radiculopathy, lumbosacral region: Secondary | ICD-10-CM | POA: Insufficient documentation

## 2019-08-24 DIAGNOSIS — G4701 Insomnia due to medical condition: Secondary | ICD-10-CM | POA: Diagnosis not present

## 2019-08-24 MED ORDER — GABAPENTIN 300 MG PO CAPS: 300.0000 mg | ORAL_CAPSULE | Freq: Three times a day (TID) | ORAL | 0 refills | Status: DC

## 2019-08-24 MED ORDER — IBUPROFEN 800 MG PO TABS: 800.0000 mg | ORAL_TABLET | Freq: Three times a day (TID) | ORAL | 0 refills | Status: DC | PRN

## 2019-08-24 NOTE — Progress Notes (Signed)
Subjective:    Patient ID: Alisha Espinoza, female    DOB: 09/06/1965, 54 y.o.   MRN: [id number]  HPI  [name] is a 54 year old woman who presents with lower back pain that radiates into her right groin, anterior thigh, and her medial right ankle.   So far for the pain she has tried walking and eating right. She can walk 1/2 mile, sometimes 1 mile. She sometimes has has to stop and stretch. She learned a lot from physical therapy last month and she does her exercises every day.   She has never gotten any spinal injections and is scared of this option at this time.   Her pain is 8/10, sharp and tingling.  Imaging reviewed.  IMPRESSION: 1. Broad-based disc protrusion and bilateral facet hypertrophy at L3-4 results in severe right and moderate left subarticular narrowing. Moderate foraminal stenosis is present bilaterally. 2. Large right lateral disc protrusion at L4-5 with severe right and mild left subarticular narrowing. 3. Moderate right and mild left foraminal narrowing at L4-5. 4. Leftward disc protrusion at L5-S1 extends into the left neural foramen without significant stenosis. 5. Broad-based disc protrusion and mild bilateral facet hypertrophy at L2-3 with mild subarticular and foraminal narrowing bilaterally.  Pain Inventory Average Pain 8 Pain Right Now 8 My pain is sharp and tingling  In the last 24 hours, has pain interfered with the following? General activity 7 Relation with others 9 Enjoyment of life 6 What TIME of day is your pain at its worst? all Sleep (in general) Poor  Pain is worse with: walking, bending, sitting, standing and some activites Pain improves with: rest, pacing activities and medication Relief from Meds: na  Mobility ability to climb steps?  yes do you drive?  yes  Function disabled: date disabled 4/19  Neuro/Psych bowel control problems numbness tingling trouble walking  Prior Studies Any changes since last visit?   no  Physicians involved in your care Any changes since last visit?  no   Family History  Problem Relation Age of Onset  . Diabetes Mother   . Hypertension Father   . Pancreatic cancer Sister   . Colon cancer Neg Hx   . Rectal cancer Neg Hx   . Stomach cancer Neg Hx   . Colon polyps Neg Hx   . Esophageal cancer Neg Hx    Social History   Socioeconomic History  . Marital status: Divorced    Spouse name: Not on file  . Number of children: Not on file  . Years of education: Not on file  . Highest education level: Not on file  Occupational History  . Not on file  Tobacco Use  . Smoking status: Current Every Day Smoker    Packs/day: 0.50    Years: 30.00    Pack years: 15.00    Types: Cigarettes  . Smokeless tobacco: Never Used  . Tobacco comment: 10cigs/day  Substance and Sexual Activity  . Alcohol use: Yes    Comment: occasionally  . Drug use: No  . Sexual activity: Yes    Birth control/protection: Surgical    Comment: Hysterectomy  Other Topics Concern  . Not on file  Social History Narrative  . Not on file   Social Determinants of Health   Financial Resource Strain:   . Difficulty of Paying Living Expenses:   Food Insecurity:   . Worried About Charity fundraiser in the Last Year:   . [place] in the Last Year:  Transportation Needs:   . Film/video editor (Medical):   Marland Kitchen Lack of Transportation (Non-Medical):   Physical Activity:   . Days of Exercise per Week:   . Minutes of Exercise per Session:   Stress:   . Feeling of Stress :   Social Connections:   . Frequency of Communication with Friends and Family:   . Frequency of Social Gatherings with Friends and Family:   . Attends Religious Services:   . Active Member of Clubs or Organizations:   . Attends Archivist Meetings:   Marland Kitchen Marital Status:    Past Surgical History:  Procedure Laterality Date  . ABDOMINAL HYSTERECTOMY  2000  . ANTERIOR CERVICAL DECOMP/DISCECTOMY FUSION   03/19/2012   Procedure: ANTERIOR CERVICAL DECOMPRESSION/DISCECTOMY FUSION 1 LEVEL;  Surgeon: Melina Schools, MD;  Location: [place];  Service: Orthopedics;  Laterality: Left;  Total Disc Replacement C4-5  . ANTERIOR CERVICAL DECOMP/DISCECTOMY FUSION N/A 05/07/2012   Procedure: ANTERIOR CERVICAL DECOMPRESSION/DISCECTOMY FUSION 1 LEVEL/HARDWARE REMOVAL;  Surgeon: Melina Schools, MD;  Location: [place];  Service: Orthopedics;  Laterality: N/A;  REMOVAL OF CERVICAL DISC REPLACEMENT AND ACDF C4-5  . BACK SURGERY    . CARPAL TUNNEL RELEASE Right 09/28/2018   Procedure: CARPAL TUNNEL RELEASE;  Surgeon: Eustace Moore, MD;  Location: [place];  Service: Neurosurgery;  Laterality: Right;  CARPAL TUNNEL RELEASE  . CERVICAL FUSION  05/07/2012   Dr Rolena Infante  . COLONOSCOPY    . CYSTECTOMY     L wrist  . POLYPECTOMY    . ROTATOR CUFF REPAIR     Right  . TRIGGER FINGER RELEASE Left 05/03/2019   Procedure: LEFT LONG AND RING FINGER RELEASE TRIGGER FINGER/A-1 PULLEY;  Surgeon: Leanora Cover, MD;  Location: [place];  Service: Orthopedics;  Laterality: Left;  beir block  . TYMPANOSTOMY TUBE PLACEMENT     Past Medical History:  Diagnosis Date  . Bronchitis   . Diabetes mellitus ([place]) 01/19/2019  . Dyspnea   . Heart murmur    resolved "closed by age 69."   . Hyperlipidemia   . Hypertension   . Seasonal allergies    BP 119/80   Pulse 72   Temp (!) 96.6 F (35.9 C)   Ht 5\' 6"  (1.676 m)   Wt 195 lb 9.6 oz (88.7 kg)   SpO2 96%   BMI 31.57 kg/m   Opioid Risk Score:   Fall Risk Score:  `1  Depression screen PHQ 2/9  Depression screen [facility] 2/9 08/24/2019 01/25/2019  Decreased Interest 2 0  Down, Depressed, Hopeless 2 1  PHQ - 2 Score 4 1  Altered sleeping 3 -  Tired, decreased energy 1 -  Change in appetite 1 -  Feeling bad or failure about yourself  1 -  Trouble concentrating 1 -  Moving slowly or fidgety/restless 3 -  Suicidal thoughts 0 -  PHQ-9 Score 14 -  Difficult doing work/chores  Somewhat difficult -    Review of Systems  Constitutional: Positive for unexpected weight change.  HENT: Negative.   Eyes: Negative.   Respiratory: Positive for cough and wheezing.   Cardiovascular: Negative.   Gastrointestinal: Positive for constipation.  Endocrine:       High blood sugars  Genitourinary: Negative.   Musculoskeletal: Positive for gait problem.  Skin: Negative.   Allergic/Immunologic: Negative.   Neurological: Positive for numbness.       Tingling  Hematological: Negative.   Psychiatric/Behavioral: Positive for dysphoric mood.  All other systems  reviewed and are negative.      Objective:   Physical Exam Gen: no distress, normal appearing HEENT: oral mucosa pink and moist, NCAT Cardio: Reg rate Chest: normal effort, normal rate of breathing Abd: soft, non-distended Ext: no edema Skin: intact Neuro: Aox3 Musculoskeletal: 5/5 strength throughout, sensation intact, gait normal, + slump test on left and negative on right. Painless extension, painful flexion.  Psych: pleasant, normal affect     Assessment & Plan:  Mrs. Denino is a 54 year old woman who presents with bilateral lower extremity lumbosacral back pain with radiculopathy. Also contributing to insomnia.   -MRI reviewed as above with patient. -Commended on her doing her HEP every day! Advised to continue walking as much as possible for pain relief (improves her pain), and general health. -Increase Gabapentin to 300mg  at night, and can take in morning and afternoon as well if needed. -Prescribed Motrin 800mg  to be taken up to three times as needed. Stop Celebrex while taking this.  -Ordered new MRI as her current is from 2 years ago and her symptoms have since progressed. -Educed regarding spinal epidural, which she may consider in the future.  All questions answered. RTC in 1 month.

## 2019-08-25 ENCOUNTER — Ambulatory Visit: Payer: Medicaid Other | Admitting: Physical Therapy

## 2019-08-25 ENCOUNTER — Encounter: Payer: Self-pay | Admitting: Physical Therapy

## 2019-08-25 DIAGNOSIS — R278 Other lack of coordination: Secondary | ICD-10-CM

## 2019-08-25 DIAGNOSIS — M62838 Other muscle spasm: Secondary | ICD-10-CM | POA: Diagnosis not present

## 2019-08-25 MED ORDER — IBUPROFEN 800 MG PO TABS: 800.0000 mg | ORAL_TABLET | Freq: Three times a day (TID) | ORAL | 0 refills | Status: DC | PRN

## 2019-08-25 NOTE — Patient Instructions (Addendum)
Breathing during bowel movement:  1. Back straight - sit up low back to upper back not rounding forward - look straight ahead 2. Lean forward - only as far as possible with back staying straight 3. Breathe - slow as you can inhaling down into your belly and feeling pressure on the pelvic floor 4. Hard belly - keep belly like a hard ball on the max inhale 5. Blow hard - like blowing up a balloon and pressure down into pelvic floor 6. Squeeze and lift - tighten pelvic floor after BM to reset everything back into place  [facility] [address], [place], [place] [address] Phone # [phone] Fax [phone]   medbridge updates

## 2019-08-25 NOTE — Therapy (Signed)
[facility] [address], [place], [place], [address] Phone: [phone]   Fax:  [phone]  Physical Therapy Treatment  Patient Details  Name: Alisha Espinoza MRN: [id number] Date of Birth: [birth year] Referring Provider (PT): Mauri Pole, MD   Encounter Date: 08/25/2019  PT End of Session - 08/25/19 1319    Visit Number  2    Date for PT Re-Evaluation  11/05/19    Authorization Type  MCD    Authorization Time Period  3 visits 5/26 to    Authorization - Visit Number  1    Authorization - Number of Visits  3    PT Start Time  [id number]    PT Stop Time  1357    PT Time Calculation (min)  39 min    Activity Tolerance  Patient limited by pain;Patient tolerated treatment well    Behavior During Therapy  [facility] for tasks assessed/performed       Past Medical History:  Diagnosis Date  . Bronchitis   . Diabetes mellitus ([place]) 01/19/2019  . Dyspnea   . Heart murmur    resolved "closed by age 75."   . Hyperlipidemia   . Hypertension   . Seasonal allergies     Past Surgical History:  Procedure Laterality Date  . ABDOMINAL HYSTERECTOMY  2000  . ANTERIOR CERVICAL DECOMP/DISCECTOMY FUSION  03/19/2012   Procedure: ANTERIOR CERVICAL DECOMPRESSION/DISCECTOMY FUSION 1 LEVEL;  Surgeon: Melina Schools, MD;  Location: [place];  Service: Orthopedics;  Laterality: Left;  Total Disc Replacement C4-5  . ANTERIOR CERVICAL DECOMP/DISCECTOMY FUSION N/A 05/07/2012   Procedure: ANTERIOR CERVICAL DECOMPRESSION/DISCECTOMY FUSION 1 LEVEL/HARDWARE REMOVAL;  Surgeon: Melina Schools, MD;  Location: [place];  Service: Orthopedics;  Laterality: N/A;  REMOVAL OF CERVICAL DISC REPLACEMENT AND ACDF C4-5  . BACK SURGERY    . CARPAL TUNNEL RELEASE Right 09/28/2018   Procedure: CARPAL TUNNEL RELEASE;  Surgeon: Eustace Moore, MD;  Location: [place];  Service: Neurosurgery;  Laterality: Right;  CARPAL TUNNEL RELEASE  . CERVICAL FUSION  05/07/2012   Dr Rolena Infante   . COLONOSCOPY    . CYSTECTOMY     L wrist  . POLYPECTOMY    . ROTATOR CUFF REPAIR     Right  . TRIGGER FINGER RELEASE Left 05/03/2019   Procedure: LEFT LONG AND RING FINGER RELEASE TRIGGER FINGER/A-1 PULLEY;  Surgeon: Leanora Cover, MD;  Location: [place];  Service: Orthopedics;  Laterality: Left;  beir block  . TYMPANOSTOMY TUBE PLACEMENT      There were no vitals filed for this visit.  Subjective Assessment - 08/25/19 1419    Subjective  Reports she is in the same amount of pain.  Has a hard time sitting still in the chair.    Currently in Pain?  Yes    Pain Score  4     Pain Location  Perineum    Pain Orientation  Posterior    Pain Descriptors / Indicators  Sore    Pain Onset  More than a month ago    Pain Frequency  Intermittent    Multiple Pain Sites  No                        [facility] Adult PT Treatment/Exercise - 08/25/19 0001      Self-Care   Self-Care  Other Self-Care Comments    Other Self-Care Comments   breathing and bulging; toileting techniques  Lumbar Exercises: Stretches   Single Knee to Chest Stretch  Right;Left;3 reps;10 seconds    Figure 4 Stretch  3 reps;30 seconds    Gastroc Stretch Limitations  hip adductor stretch      Manual Therapy   Soft tissue mobilization  STM lumbar and lower thoracic paraspinals       Trigger Point Dry Needling - 08/25/19 0001    Consent Given?  Yes    Education Handout Provided  Yes    Muscles Treated Back/Hip  Thoracic multifidi    Thoracic multifidi response  Twitch response elicited;Palpable increased muscle length   T12 bilat          PT Education - 08/25/19 1403    Education Details  dry needling; Access Code [email or web address]    Person(s) Educated  Patient    Methods  Explanation;Demonstration    Comprehension  Verbalized understanding;Returned demonstration       PT Short Term Goals - 08/16/19 1240      PT SHORT TERM GOAL #1   Title  She will be indpendent with initial HEP     Time  4    Period  Weeks    Status  New    Target Date  09/10/19      PT SHORT TERM GOAL #2   Title  She will report at least 25% less pain with a BM    Time  4    Period  Weeks    Status  New    Target Date  09/10/19      PT SHORT TERM GOAL #3   Title  Pt will report BM is more normal consistency of 3-5 on [place] stool chart    Time  4    Period  Weeks    Status  New    Target Date  09/10/19        PT Long Term Goals - 08/16/19 1247      PT LONG TERM GOAL #1   Title  She will be indpendent with all HEP issued    Time  12    Period  Weeks    Status  New    Target Date  11/05/19      PT LONG TERM GOAL #2   Title  She will report pain with BM decreased 50% or more    Time  12    Period  Weeks    Status  New    Target Date  11/05/19      PT LONG TERM GOAL #3   Title  Pt will be able to perform breathing techniques and bulge pelvic floor so she can improved muscle elasticity for better bowel movements    Time  12    Period  Weeks    Status  New    Target Date  11/05/19      PT LONG TERM GOAL #4   Title  Pt will be able to perform transfers and lifting techniques correctly without using valsalva maneuver    Time  12    Period  Weeks    Status  New    Target Date  11/05/19      PT LONG TERM GOAL #5   Title  Increase bilat. hip extension strength to 5/5 MMT to improve ability for lifting for chores    Baseline  4/5    Time  12    Period  Weeks    Status  New    Target Date  11/05/19            Plan - 08/25/19 1410    Clinical Impression Statement  Pt was limited by pain of the anus today.  Pt was educated in breathing techniques but having pain when doing in sitting.  Pt did well in supine and she felt good adding stretches . Pt also tolerated dry needling and reports some relief after today's treatment.  Pt will benefit from skilled PT to progress with stretches and downtraining for pain management.    Examination-Activity Limitations  Bed  Mobility;Carry;Lift;Stand;Locomotion Level;Continence;Toileting    PT Treatment/Interventions  Printmaker;Iontophoresis 4mg /ml Dexamethasone;Moist Heat;Therapeutic exercise;Therapeutic activities;Patient/family education;Passive range of motion;Dry needling;Manual techniques;Taping;ADLs/Self Care Home Management;Biofeedback;Cryotherapy;Neuromuscular re-education    PT Next Visit Plan  breathing and bulging, internal assessment if tolerated, toileting techniques; f/u DN to T12/L1 paraspinals    Consulted and Agree with Plan of Care  Patient       Patient will benefit from skilled therapeutic intervention in order to improve the following deficits and impairments:  Pain, Increased muscle spasms, Decreased strength, Decreased activity tolerance, Difficulty walking, Impaired tone, Decreased coordination  Visit Diagnosis: Other muscle spasm  Other lack of coordination     Problem List Patient Active Problem List   Diagnosis Date Noted  . 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 Hemorrhoids 01/21/2019  . Essential hypertension 01/21/2019  . Serum calcium elevated 01/21/2019  . Elevated cholesterol 01/19/2019  . Diabetes mellitus ([place]) 01/19/2019  . Trigger ring finger of left hand 01/11/2019  . Trigger finger of right thumb 01/11/2019  . 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 Acute bronchitis 08/08/2015  . Leukocytosis 08/08/2015  . Hyperglycemia 08/08/2015  . Tobacco abuse 08/08/2015  . 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 BACTERIAL PNEUMONIA, RIGHT LOWER LOBE 04/27/2008  . LUMP OR MASS IN BREAST 04/27/2008  . COUGH 04/27/2008    Jule Ser, PT 08/25/2019, 2:20 PM  [facility] [address], [place], [place], [address] Phone: [phone]   Fax:  [phone]  Name: Alisha Espinoza MRN: [id number] Date of Birth: [birth year]

## 2019-08-25 NOTE — Addendum Note (Signed)
Addended by: Caro Hight on: 08/25/2019 03:51 PM   Modules accepted: Orders

## 2019-08-26 ENCOUNTER — Encounter: Payer: Self-pay | Admitting: Family Medicine

## 2019-08-26 ENCOUNTER — Other Ambulatory Visit: Payer: Self-pay

## 2019-08-26 ENCOUNTER — Ambulatory Visit (INDEPENDENT_AMBULATORY_CARE_PROVIDER_SITE_OTHER): Payer: Medicaid Other | Admitting: Family Medicine

## 2019-08-26 DIAGNOSIS — G8929 Other chronic pain: Secondary | ICD-10-CM | POA: Diagnosis not present

## 2019-08-26 DIAGNOSIS — M5440 Lumbago with sciatica, unspecified side: Secondary | ICD-10-CM

## 2019-08-26 NOTE — Progress Notes (Signed)
Office Visit Note   Patient: Alisha Espinoza           Date of Birth: 05-16-1965           MRN: [id number] Visit Date: 08/26/2019 Requested by: Drue Flirt, MD [id number],  [place] [address] PCP: Drue Flirt, MD  Subjective: Chief Complaint  Patient presents with  . Lower Back - Pain, Follow-up    Continues to have bilateral low back pain, with numbness in the medial aspect of the left ankle.    HPI: She is here for follow-up chronic back pain with left leg radicular symptoms.  Since last visit she has been to physical therapy which seems to help to some degree.  It does not give lasting relief.  She went to her pain specialist who has ordered an MRI scan to be done next month.  Patient is try to get off medication.  She is currently taking only ibuprofen.               ROS: No bowel or bladder dysfunction.  All other systems were reviewed and are negative.  Objective: Vital Signs: There were no vitals taken for this visit.  Physical Exam:  General:  Alert and oriented, in no acute distress. Pulm:  Breathing unlabored. Psy:  Normal mood, congruent affect.  Low back: She has some tenderness in the left sciatic notch.  Straight leg raise negative.  She has mild pain in the groin area with bilateral hip internal rotation.  She still has pretty good range of motion.  Lower extremity strength and reflexes remain normal.  Imaging: No results found.  Assessment & Plan: 1.  Chronic low back pain with left-sided sciatica -We will await MRI results.  Depending on the results, she might be a candidate for epidural steroid injection.  She is certainly interested in this option.     Procedures: No procedures performed  No notes on file     PMFS History: Patient Active Problem List   Diagnosis Date Noted  . 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 Hemorrhoids 01/21/2019  . Essential hypertension 01/21/2019  . Serum calcium elevated 01/21/2019  . Elevated  cholesterol 01/19/2019  . Diabetes mellitus ([place]) 01/19/2019  . Trigger ring finger of left hand 01/11/2019  . Trigger finger of right thumb 01/11/2019  . 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 Acute bronchitis 08/08/2015  . Leukocytosis 08/08/2015  . Hyperglycemia 08/08/2015  . Tobacco abuse 08/08/2015  . 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 BACTERIAL PNEUMONIA, RIGHT LOWER LOBE 04/27/2008  . LUMP OR MASS IN BREAST 04/27/2008  . COUGH 04/27/2008   Past Medical History:  Diagnosis Date  . Bronchitis   . Diabetes mellitus ([place]) 01/19/2019  . Dyspnea   . Heart murmur    resolved "closed by age 28."   . Hyperlipidemia   . Hypertension   . Seasonal allergies     Family History  Problem Relation Age of Onset  . Diabetes Mother   . Hypertension Father   . Pancreatic cancer Sister   . Colon cancer Neg Hx   . Rectal cancer Neg Hx   . Stomach cancer Neg Hx   . Colon polyps Neg Hx   . Esophageal cancer Neg Hx     Past Surgical History:  Procedure Laterality Date  . ABDOMINAL HYSTERECTOMY  2000  . ANTERIOR CERVICAL DECOMP/DISCECTOMY FUSION  03/19/2012   Procedure: ANTERIOR CERVICAL DECOMPRESSION/DISCECTOMY FUSION 1 LEVEL;  Surgeon: Melina Schools, MD;  Location: [place];  Service: Orthopedics;  Laterality: Left;  Total Disc Replacement C4-5  . ANTERIOR CERVICAL DECOMP/DISCECTOMY FUSION N/A 05/07/2012   Procedure: ANTERIOR CERVICAL DECOMPRESSION/DISCECTOMY FUSION 1 LEVEL/HARDWARE REMOVAL;  Surgeon: Melina Schools, MD;  Location: [place];  Service: Orthopedics;  Laterality: N/A;  REMOVAL OF CERVICAL DISC REPLACEMENT AND ACDF C4-5  . BACK SURGERY    . CARPAL TUNNEL RELEASE Right 09/28/2018   Procedure: CARPAL TUNNEL RELEASE;  Surgeon: Eustace Moore, MD;  Location: [place];  Service: Neurosurgery;  Laterality: Right;  CARPAL TUNNEL RELEASE  . CERVICAL FUSION  05/07/2012   Dr Rolena Infante  . COLONOSCOPY    . CYSTECTOMY     L wrist  . POLYPECTOMY    . ROTATOR CUFF REPAIR     Right  .  TRIGGER FINGER RELEASE Left 05/03/2019   Procedure: LEFT LONG AND RING FINGER RELEASE TRIGGER FINGER/A-1 PULLEY;  Surgeon: Leanora Cover, MD;  Location: [place];  Service: Orthopedics;  Laterality: Left;  beir block  . TYMPANOSTOMY TUBE PLACEMENT     Social History   Occupational History  . Not on file  Tobacco Use  . Smoking status: Current Every Day Smoker    Packs/day: 0.50    Years: 30.00    Pack years: 15.00    Types: Cigarettes  . Smokeless tobacco: Never Used  . Tobacco comment: 10cigs/day  Substance and Sexual Activity  . Alcohol use: Yes    Comment: occasionally  . Drug use: No  . Sexual activity: Yes    Birth control/protection: Surgical    Comment: Hysterectomy

## 2019-09-01 ENCOUNTER — Telehealth: Payer: Self-pay | Admitting: *Deleted

## 2019-09-01 NOTE — Telephone Encounter (Signed)
Patient left a message asking if Dr. Ranell Patrick would prescribe some tramadol to compliment her other pain medication (presumably Advil/ibuprofen). She is hoping for something stronger, that will last a little longer.

## 2019-09-02 NOTE — Telephone Encounter (Signed)
Good morning Alisha Espinoza, can you please let her know that I can prescribed Tramadol but she would have to sign a pain contract and get a urine sample with [place] first as per our policy. She can move her appointment up to next week if she would like. Thanks so much!

## 2019-09-03 ENCOUNTER — Telehealth: Payer: Self-pay | Admitting: Gastroenterology

## 2019-09-03 NOTE — Telephone Encounter (Signed)
Has not heard back. Checking on status.

## 2019-09-03 NOTE — Telephone Encounter (Signed)
Pt requesting prescription for Tramadol for hemorrhoidal pain. Discussed with pt that we do not prescribe pain medication for hemorrhoids. Discussed with pt that she can try recticare otc for pain.

## 2019-09-08 ENCOUNTER — Telehealth: Payer: Self-pay | Admitting: Family Medicine

## 2019-09-08 NOTE — Telephone Encounter (Signed)
Form received from [employer]. Sent to [place].

## 2019-09-09 ENCOUNTER — Other Ambulatory Visit: Payer: Self-pay

## 2019-09-09 ENCOUNTER — Encounter: Payer: Self-pay | Admitting: Physical Medicine and Rehabilitation

## 2019-09-09 ENCOUNTER — Encounter
Payer: Medicaid Other | Attending: Physical Medicine and Rehabilitation | Admitting: Physical Medicine and Rehabilitation

## 2019-09-09 VITALS — BP 112/78 | HR 85 | Temp 97.3°F | Ht 66.0 in | Wt 192.0 lb

## 2019-09-09 DIAGNOSIS — Z79891 Long term (current) use of opiate analgesic: Secondary | ICD-10-CM | POA: Diagnosis not present

## 2019-09-09 DIAGNOSIS — Z5181 Encounter for therapeutic drug level monitoring: Secondary | ICD-10-CM | POA: Insufficient documentation

## 2019-09-09 DIAGNOSIS — M5417 Radiculopathy, lumbosacral region: Secondary | ICD-10-CM | POA: Insufficient documentation

## 2019-09-09 DIAGNOSIS — G894 Chronic pain syndrome: Secondary | ICD-10-CM | POA: Insufficient documentation

## 2019-09-09 DIAGNOSIS — G4701 Insomnia due to medical condition: Secondary | ICD-10-CM | POA: Insufficient documentation

## 2019-09-09 MED ORDER — IBUPROFEN 800 MG PO TABS: 800.0000 mg | ORAL_TABLET | Freq: Three times a day (TID) | ORAL | 0 refills | Status: DC | PRN

## 2019-09-09 MED ORDER — GABAPENTIN 300 MG PO CAPS: 300.0000 mg | ORAL_CAPSULE | Freq: Three times a day (TID) | ORAL | 0 refills | Status: DC

## 2019-09-09 MED ORDER — TRAMADOL HCL 50 MG PO TABS: 50.0000 mg | ORAL_TABLET | Freq: Two times a day (BID) | ORAL | 0 refills | Status: DC | PRN

## 2019-09-09 NOTE — Telephone Encounter (Signed)
Patient seen today, contract signed, UDS performed at [facility]

## 2019-09-09 NOTE — Progress Notes (Signed)
Subjective:    Patient ID: Alisha Espinoza, female    DOB: 10/03/1965, 54 y.o.   MRN: [id number]  HPI  Alisha Espinoza is a 54 year old woman who presents for follow-up of lower back pain that radiates into her right groin, anterior thigh, and her medial right ankle.    So far for the pain she has tried walking and eating right. She can walk 1/2 mile, sometimes 1 mile. She sometimes has has to stop and stretch. She learned a lot from physical therapy last month and she does her exercises every day.    She has never gotten any spinal injections and is scared of this option at this time. She had   Her pain is 6/10, sharp and tingling,improved from last visit.   She is scheduled to have her MRI on Friday.    Imaging reviewed.  IMPRESSION: 1. Broad-based disc protrusion and bilateral facet hypertrophy at L3-4 results in severe right and moderate left subarticular narrowing. Moderate foraminal stenosis is present bilaterally. 2. Large right lateral disc protrusion at L4-5 with severe right and mild left subarticular narrowing. 3. Moderate right and mild left foraminal narrowing at L4-5. 4. Leftward disc protrusion at L5-S1 extends into the left neural foramen without significant stenosis. 5. Broad-based disc protrusion and mild bilateral facet hypertrophy at L2-3 with mild subarticular and foraminal narrowing bilaterally.    Pain Inventory Average Pain 6 Pain Right Now 6 My pain is sharp, stabbing, tingling and aching  In the last 24 hours, has pain interfered with the following? General activity 5 Relation with others 2 Enjoyment of life 8 What TIME of day is your pain at its worst? all Sleep (in general) Poor  Pain is worse with: walking, sitting and some activites Pain improves with: rest, heat/ice, pacing activities and medication Relief from Meds: 2  Mobility walk without assistance walk with assistance ability to climb steps?  yes do you drive?  yes Do you have any goals  in this area?  yes  Function disabled: date disabled 2019 Do you have any goals in this area?  yes  Neuro/Psych bowel control problems tingling trouble walking  Prior Studies Any changes since last visit?  no  Physicians involved in your care Any changes since last visit?  no   Family History  Problem Relation Age of Onset  . Diabetes Mother   . Hypertension Father   . Pancreatic cancer Sister   . Colon cancer Neg Hx   . Rectal cancer Neg Hx   . Stomach cancer Neg Hx   . Colon polyps Neg Hx   . Esophageal cancer Neg Hx    Social History   Socioeconomic History  . Marital status: Divorced    Spouse name: Not on file  . Number of children: Not on file  . Years of education: Not on file  . Highest education level: Not on file  Occupational History  . Not on file  Tobacco Use  . Smoking status: Current Every Day Smoker    Packs/day: 0.50    Years: 30.00    Pack years: 15.00    Types: Cigarettes  . Smokeless tobacco: Never Used  . Tobacco comment: 10cigs/day  Vaping Use  . Vaping Use: Never used  Substance and Sexual Activity  . Alcohol use: Yes    Comment: occasionally  . Drug use: No  . Sexual activity: Yes    Birth control/protection: Surgical    Comment: Hysterectomy  Other Topics Concern  .  Not on file  Social History Narrative  . Not on file   Social Determinants of Health   Financial Resource Strain:   . Difficulty of Paying Living Expenses:   Food Insecurity:   . Worried About Charity fundraiser in the Last Year:   . Arboriculturist in the Last Year:   Transportation Needs:   . Film/video editor (Medical):   Marland Kitchen Lack of Transportation (Non-Medical):   Physical Activity:   . Days of Exercise per Week:   . Minutes of Exercise per Session:   Stress:   . Feeling of Stress :   Social Connections:   . Frequency of Communication with Friends and Family:   . Frequency of Social Gatherings with Friends and Family:   . Attends Religious  Services:   . Active Member of Clubs or Organizations:   . Attends Archivist Meetings:   Marland Kitchen Marital Status:    Past Surgical History:  Procedure Laterality Date  . ABDOMINAL HYSTERECTOMY  2000  . ANTERIOR CERVICAL DECOMP/DISCECTOMY FUSION  03/19/2012   Procedure: ANTERIOR CERVICAL DECOMPRESSION/DISCECTOMY FUSION 1 LEVEL;  Surgeon: Melina Schools, MD;  Location: [place];  Service: Orthopedics;  Laterality: Left;  Total Disc Replacement C4-5  . ANTERIOR CERVICAL DECOMP/DISCECTOMY FUSION N/A 05/07/2012   Procedure: ANTERIOR CERVICAL DECOMPRESSION/DISCECTOMY FUSION 1 LEVEL/HARDWARE REMOVAL;  Surgeon: Melina Schools, MD;  Location: [place];  Service: Orthopedics;  Laterality: N/A;  REMOVAL OF CERVICAL DISC REPLACEMENT AND ACDF C4-5  . BACK SURGERY    . CARPAL TUNNEL RELEASE Right 09/28/2018   Procedure: CARPAL TUNNEL RELEASE;  Surgeon: Eustace Moore, MD;  Location: [place];  Service: Neurosurgery;  Laterality: Right;  CARPAL TUNNEL RELEASE  . CERVICAL FUSION  05/07/2012   Dr Rolena Infante  . COLONOSCOPY    . CYSTECTOMY     L wrist  . POLYPECTOMY    . ROTATOR CUFF REPAIR     Right  . TRIGGER FINGER RELEASE Left 05/03/2019   Procedure: LEFT LONG AND RING FINGER RELEASE TRIGGER FINGER/A-1 PULLEY;  Surgeon: Leanora Cover, MD;  Location: [place];  Service: Orthopedics;  Laterality: Left;  beir block  . TYMPANOSTOMY TUBE PLACEMENT     Past Medical History:  Diagnosis Date  . Bronchitis   . Diabetes mellitus ([place]) 01/19/2019  . Dyspnea   . Heart murmur    resolved "closed by age 54."   . Hyperlipidemia   . Hypertension   . Seasonal allergies    BP 112/78   Pulse 85   Temp (!) 97.3 F (36.3 C)   Ht 5\' 6"  (1.676 m)   Wt 192 lb (87.1 kg)   SpO2 93%   BMI 30.99 kg/m   Opioid Risk Score:   Fall Risk Score:  `1  Depression screen PHQ 2/9  Depression screen [facility] 2/9 08/24/2019 01/25/2019  Decreased Interest 2 0  Down, Depressed, Hopeless 2 1  PHQ - 2 Score 4 1  Altered  sleeping 3 -  Tired, decreased energy 1 -  Change in appetite 1 -  Feeling bad or failure about yourself  1 -  Trouble concentrating 1 -  Moving slowly or fidgety/restless 3 -  Suicidal thoughts 0 -  PHQ-9 Score 14 -  Difficult doing work/chores Somewhat difficult -    Review of Systems  Gastrointestinal: Positive for constipation.  Endocrine:       High blood sugar  Musculoskeletal: Positive for arthralgias, back pain and gait problem.  Neurological:  Tingling  All other systems reviewed and are negative.      Objective:   Physical Exam Gen: no distress, normal appearing HEENT: oral mucosa pink and moist, NCAT Cardio: Reg rate Chest: normal effort, normal rate of breathing Abd: soft, non-distended Ext: no edema Skin: intact Neuro: Aox3 Musculoskeletal: 5/5 strength throughout, sensation intact, gait normal, + slump test on left and negative on right. Painless extension, painful flexion.  Psych: pleasant, normal affect     Assessment & Plan:  Alisha Espinoza is a 54 year old woman who presents with bilateral lower extremity lumbosacral back pain with radiculopathy. Also contributing to insomnia.    -MRI reviewed as above with patient. New MRI scheduled for this week.  -Commended on her doing her HEP every day! Advised to continue walking as much as possible for pain relief (improves her pain), and general health. -Increase Gabapentin to 300mg  at night, and can take in morning and afternoon as well if needed. -Prescribed Motrin 800mg  to be taken up to three times as needed. Stop Celebrex while taking this.  -Educed regarding spinal epidural, which she may consider in the future. -Pain contract and UDS signed today. Tramadol 50mg  BID PRN 60 pills sent.    All questions answered. RTC in 1 month.

## 2019-09-10 ENCOUNTER — Encounter: Payer: Self-pay | Admitting: Physical Therapy

## 2019-09-10 ENCOUNTER — Ambulatory Visit: Payer: Medicaid Other | Attending: Gastroenterology | Admitting: Physical Therapy

## 2019-09-10 DIAGNOSIS — R278 Other lack of coordination: Secondary | ICD-10-CM | POA: Diagnosis present

## 2019-09-10 DIAGNOSIS — M62838 Other muscle spasm: Secondary | ICD-10-CM

## 2019-09-10 NOTE — Therapy (Signed)
[facility] [address], [place], [place], [address] Phone: [phone]   Fax:  [phone]  Physical Therapy Treatment  Patient Details  Name: JEANNELLE WIENS MRN: [id number] Date of Birth: Feb 18, 1966 Referring Provider (PT): Mauri Pole, MD   Encounter Date: 09/10/2019   PT End of Session - 09/10/19 0852    Visit Number 3    Date for PT Re-Evaluation 11/05/19    Authorization Type MCD    Authorization Time Period 3 visits 5/26 to    Authorization - Visit Number 2    Authorization - Number of Visits 3    PT Start Time [phone]   arrived late   PT Stop Time 0931    PT Time Calculation (min) 39 min    Activity Tolerance Patient limited by pain;Patient tolerated treatment well    Behavior During Therapy [facility] for tasks assessed/performed           Past Medical History:  Diagnosis Date  . Bronchitis   . Diabetes mellitus ([place]) 01/19/2019  . Dyspnea   . Heart murmur    resolved "closed by age 27."   . Hyperlipidemia   . Hypertension   . Seasonal allergies     Past Surgical History:  Procedure Laterality Date  . ABDOMINAL HYSTERECTOMY  2000  . ANTERIOR CERVICAL DECOMP/DISCECTOMY FUSION  03/19/2012   Procedure: ANTERIOR CERVICAL DECOMPRESSION/DISCECTOMY FUSION 1 LEVEL;  Surgeon: Melina Schools, MD;  Location: [place];  Service: Orthopedics;  Laterality: Left;  Total Disc Replacement C4-5  . ANTERIOR CERVICAL DECOMP/DISCECTOMY FUSION N/A 05/07/2012   Procedure: ANTERIOR CERVICAL DECOMPRESSION/DISCECTOMY FUSION 1 LEVEL/HARDWARE REMOVAL;  Surgeon: Melina Schools, MD;  Location: [place];  Service: Orthopedics;  Laterality: N/A;  REMOVAL OF CERVICAL DISC REPLACEMENT AND ACDF C4-5  . BACK SURGERY    . CARPAL TUNNEL RELEASE Right 09/28/2018   Procedure: CARPAL TUNNEL RELEASE;  Surgeon: Eustace Moore, MD;  Location: [place];  Service: Neurosurgery;  Laterality: Right;  CARPAL TUNNEL RELEASE  . CERVICAL FUSION  05/07/2012    Dr Rolena Infante  . COLONOSCOPY    . CYSTECTOMY     L wrist  . POLYPECTOMY    . ROTATOR CUFF REPAIR     Right  . TRIGGER FINGER RELEASE Left 05/03/2019   Procedure: LEFT LONG AND RING FINGER RELEASE TRIGGER FINGER/A-1 PULLEY;  Surgeon: Leanora Cover, MD;  Location: [place];  Service: Orthopedics;  Laterality: Left;  beir block  . TYMPANOSTOMY TUBE PLACEMENT      There were no vitals filed for this visit.   Subjective Assessment - 09/10/19 0854    Subjective Pt states she is a little better and doing the breathing and bulging    Pertinent History L3-4 L4-5  fusion.       Cervical surgery x 2  one a fusion   left trigger finger surgery    Limitations Standing;Walking;House hold activities    Patient Stated Goals be able to have more solid stool and be able to pass it without pain    Currently in Pain? Yes    Pain Score 7     Pain Location Perineum    Pain Descriptors / Indicators Sore    Pain Type Chronic pain    Pain Onset More than a month ago    Pain Frequency Intermittent  [place] Adult PT Treatment/Exercise - 09/10/19 0001      Neuro Re-ed    Neuro Re-ed Details  tactile and verbal cues with breathing and rest and then bulging pelvic floor      Manual Therapy   Manual Therapy Soft tissue mobilization;Myofascial release    Soft tissue mobilization externally to posterior pelvic floor; pressure on anal sphinters; bilateral gluteals    Myofascial Release abdominal colon and rectum fascial movement in all planes                    PT Short Term Goals - 08/16/19 1240      PT SHORT TERM GOAL #1   Title She will be indpendent with initial HEP    Time 4    Period Weeks    Status New    Target Date 09/10/19      PT SHORT TERM GOAL #2   Title She will report at least 25% less pain with a BM    Time 4    Period Weeks    Status New    Target Date 09/10/19      PT SHORT TERM GOAL #3   Title Pt will report BM  is more normal consistency of 3-5 on bristol stool chart    Time 4    Period Weeks    Status New    Target Date 09/10/19             PT Long Term Goals - 08/16/19 1247      PT LONG TERM GOAL #1   Title She will be indpendent with all HEP issued    Time 12    Period Weeks    Status New    Target Date 11/05/19      PT LONG TERM GOAL #2   Title She will report pain with BM decreased 50% or more    Time 12    Period Weeks    Status New    Target Date 11/05/19      PT LONG TERM GOAL #3   Title Pt will be able to perform breathing techniques and bulge pelvic floor so she can improved muscle elasticity for better bowel movements    Time 12    Period Weeks    Status New    Target Date 11/05/19      PT LONG TERM GOAL #4   Title Pt will be able to perform transfers and lifting techniques correctly without using valsalva maneuver    Time 12    Period Weeks    Status New    Target Date 11/05/19      PT LONG TERM GOAL #5   Title Increase bilat. hip extension strength to 5/5 MMT to improve ability for lifting for chores    Baseline 4/5    Time 12    Period Weeks    Status New    Target Date 11/05/19                 Plan - 09/10/19 1116    Clinical Impression Statement Pt responded well to treatment without increased pain.  external palpation and massage to sphincter muscles was applied.  Pt is very TTP to hemorhoid and could not tolerate any pressure there so intenal STM not performed.  Pt was able to improve bulging after STM externally.  Pt was educated on doing this herself when she applied cream.  Pt    PT Treatment/Interventions Electrical Stimulation;Iontophoresis 4mg /ml  Dexamethasone;Moist Heat;Therapeutic exercise;Therapeutic activities;Patient/family education;Passive range of motion;Dry needling;Manual techniques;Taping;ADLs/Self Care Home Management;Biofeedback;Cryotherapy;Neuromuscular re-education    PT Next Visit Plan gluteal and lumbar stretches; f/u on  self massage; toileting techniques    PT Home Exercise Plan [id number]    Consulted and Agree with Plan of Care Patient           Patient will benefit from skilled therapeutic intervention in order to improve the following deficits and impairments:  Pain, Increased muscle spasms, Decreased strength, Decreased activity tolerance, Difficulty walking, Impaired tone, Decreased coordination  Visit Diagnosis: Other muscle spasm  Other lack of coordination     Problem List Patient Active Problem List   Diagnosis Date Noted  . 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 Hemorrhoids 01/21/2019  . Essential hypertension 01/21/2019  . Serum calcium elevated 01/21/2019  . Elevated cholesterol 01/19/2019  . Diabetes mellitus ([place]) 01/19/2019  . Trigger ring finger of left hand 01/11/2019  . Trigger finger of right thumb 01/11/2019  . 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 Acute bronchitis 08/08/2015  . Leukocytosis 08/08/2015  . Hyperglycemia 08/08/2015  . Tobacco abuse 08/08/2015  . 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 BACTERIAL PNEUMONIA, RIGHT LOWER LOBE 04/27/2008  . LUMP OR MASS IN BREAST 04/27/2008  . COUGH 04/27/2008    Jule Ser, PT 09/10/2019, 11:24 AM  [facility] [address], [place], [place], [address] Phone: [phone]   Fax:  [phone]  Name: MEELA WAREING MRN: [id number] Date of Birth: 06-25-1965

## 2019-09-11 ENCOUNTER — Other Ambulatory Visit: Payer: Medicaid Other

## 2019-09-14 ENCOUNTER — Other Ambulatory Visit: Payer: Self-pay

## 2019-09-14 ENCOUNTER — Ambulatory Visit: Payer: Medicaid Other | Admitting: Physical Therapy

## 2019-09-14 ENCOUNTER — Encounter: Payer: Self-pay | Admitting: Physical Therapy

## 2019-09-14 DIAGNOSIS — R278 Other lack of coordination: Secondary | ICD-10-CM

## 2019-09-14 DIAGNOSIS — M62838 Other muscle spasm: Secondary | ICD-10-CM | POA: Diagnosis not present

## 2019-09-14 LAB — TOXASSURE SELECT,+ANTIDEPR,UR

## 2019-09-15 ENCOUNTER — Encounter (HOSPITAL_BASED_OUTPATIENT_CLINIC_OR_DEPARTMENT_OTHER): Payer: Self-pay | Admitting: Orthopedic Surgery

## 2019-09-15 ENCOUNTER — Other Ambulatory Visit: Payer: Self-pay

## 2019-09-15 NOTE — Therapy (Signed)
[facility] [address], [place], [place], [address] Phone: [phone]   Fax:  [phone]  Physical Therapy Treatment  Patient Details  Name: Alisha Espinoza MRN: [id number] Date of Birth: Jul 01, 1965 Referring Provider (PT): Mauri Pole, MD   Encounter Date: 09/14/2019   PT End of Session - 09/14/19 1155    Visit Number 4    Date for PT Re-Evaluation 11/05/19    Authorization Type MCD    Authorization Time Period check re-auth    Authorization - Visit Number 3    Authorization - Number of Visits 3    PT Start Time 3267    PT Stop Time 1229    PT Time Calculation (min) 41 min    Activity Tolerance Patient limited by pain;Patient tolerated treatment well    Behavior During Therapy [facility] for tasks assessed/performed           Past Medical History:  Diagnosis Date  . Bronchitis   . Diabetes mellitus ([place]) 01/19/2019  . Dyspnea   . Heart murmur    resolved "closed by age 89."   . Hyperlipidemia   . Hypertension   . Seasonal allergies     Past Surgical History:  Procedure Laterality Date  . ABDOMINAL HYSTERECTOMY  2000  . ANTERIOR CERVICAL DECOMP/DISCECTOMY FUSION  03/19/2012   Procedure: ANTERIOR CERVICAL DECOMPRESSION/DISCECTOMY FUSION 1 LEVEL;  Surgeon: Melina Schools, MD;  Location: [place];  Service: Orthopedics;  Laterality: Left;  Total Disc Replacement C4-5  . ANTERIOR CERVICAL DECOMP/DISCECTOMY FUSION N/A 05/07/2012   Procedure: ANTERIOR CERVICAL DECOMPRESSION/DISCECTOMY FUSION 1 LEVEL/HARDWARE REMOVAL;  Surgeon: Melina Schools, MD;  Location: [place];  Service: Orthopedics;  Laterality: N/A;  REMOVAL OF CERVICAL DISC REPLACEMENT AND ACDF C4-5  . BACK SURGERY    . CARPAL TUNNEL RELEASE Right 09/28/2018   Procedure: CARPAL TUNNEL RELEASE;  Surgeon: Eustace Moore, MD;  Location: [place];  Service: Neurosurgery;  Laterality: Right;  CARPAL TUNNEL RELEASE  . CERVICAL FUSION  05/07/2012   Dr Rolena Infante  .  COLONOSCOPY    . CYSTECTOMY     L wrist  . POLYPECTOMY    . ROTATOR CUFF REPAIR     Right  . TRIGGER FINGER RELEASE Left 05/03/2019   Procedure: LEFT LONG AND RING FINGER RELEASE TRIGGER FINGER/A-1 PULLEY;  Surgeon: Leanora Cover, MD;  Location: [place];  Service: Orthopedics;  Laterality: Left;  beir block  . TYMPANOSTOMY TUBE PLACEMENT      There were no vitals filed for this visit.   Subjective Assessment - 09/14/19 1151    Subjective States she is doing better doing self massage with the medicine she is using.  Reports 10% better overall    Pertinent History L3-4 L4-5  fusion.       Cervical surgery x 2  one a fusion   left trigger finger surgery    Limitations Standing;Walking;House hold activities    Patient Stated Goals be able to have more solid stool and be able to pass it without pain    Currently in Pain? Yes    Pain Score 6     Pain Location Generalized    Pain Descriptors / Indicators Sore    Pain Type Chronic pain    Pain Radiating Towards rectum is the worst but pain all over    Pain Onset More than a month ago    Pain Frequency Constant  [facility] PT Assessment - 09/15/19 0001      ROM / Strength   AROM / PROM / Strength AROM      AROM   Overall AROM Comments able to do small pelvic tilt with max TC and VC                      Pelvic Floor Special Questions - 09/15/19 0001    External Palpation TTP at hemorroid on anal sphincter; no excursion with bulging             [facility] Adult PT Treatment/Exercise - 09/15/19 0001      Neuro Re-ed    Neuro Re-ed Details  max TC and VC for pelvic tilt cat cow and agains the wall      Exercises   Exercises Lumbar      Lumbar Exercises: Standing   Other Standing Lumbar Exercises pelvic tilt agains the wall      Lumbar Exercises: Quadruped   [place] 5 reps      Moist Heat Therapy   Number Minutes Moist Heat 10 Minutes    Moist Heat Location Lumbar Spine   during  manual to thoracic     Manual Therapy   Soft tissue mobilization thoracic lumbar paraspinals and gluteals bilateral   became stiff lying face down during the STM                   PT Short Term Goals - 09/14/19 1218      PT SHORT TERM GOAL #1   Title She will be indpendent with initial HEP    Status Achieved      PT SHORT TERM GOAL #2   Title She will report at least 25% less pain with a BM    Baseline 10% improved    Status On-going      PT SHORT TERM GOAL #3   Title Pt will report BM is more normal consistency of 3-5 on bristol stool chart    Baseline hard to tell    Status On-going             PT Long Term Goals - 09/14/19 1220      PT LONG TERM GOAL #1   Title She will be indpendent with all HEP issued    Status On-going      PT LONG TERM GOAL #2   Title She will report pain with BM decreased 50% or more    Baseline 10% better and it doesn't hurt for as much time    Status On-going      PT LONG TERM GOAL #3   Title Pt will be able to perform breathing techniques and bulge pelvic floor so she can improved muscle elasticity for better bowel movements      PT LONG TERM GOAL #4   Title Pt will be able to perform transfers and lifting techniques correctly without using valsalva maneuver    Status On-going      PT LONG TERM GOAL #5   Title Increase bilat. hip extension strength to 5/5 MMT to improve ability for lifting for chores    Status On-going                 Plan - 09/14/19 1657    Clinical Impression Statement Today's session focused on re-assessment of goals.  She continues to have a lot of pain around the anus and cannot tolerate pressure or TC but she is  working on improved ability to relax anal sphincter muscles with self massage using the cream she was perscribed by the doctor.  Pt has muscle spasms along posterior kinetic chain and is doing well with STM and fascial release.  She has been able to gain some mobility of the spine needed  for overall improved pressure management of her trunk and reduce the muscle spasms of the pelvic floor.  Her back stiffness and pain is preventing her from getting into the ideal position on the toilet to have a bowel movement.  She is recommended to continue skilled PT to address these impairments.    PT Treatment/Interventions Electrical Stimulation;Iontophoresis 4mg /ml Dexamethasone;Moist Heat;Therapeutic exercise;Therapeutic activities;Patient/family education;Passive range of motion;Dry needling;Manual techniques;Taping;ADLs/Self Care Home Management;Biofeedback;Cryotherapy;Neuromuscular re-education    PT Next Visit Plan gluteal and lumbar stretches; f/u on self massage and toileting as needed    PT Home Exercise Plan [id number]    Consulted and Agree with Plan of Care Patient           Patient will benefit from skilled therapeutic intervention in order to improve the following deficits and impairments:  Pain, Increased muscle spasms, Decreased strength, Decreased activity tolerance, Difficulty walking, Impaired tone, Decreased coordination  Visit Diagnosis: Other muscle spasm  Other lack of coordination     Problem List Patient Active Problem List   Diagnosis Date Noted  . 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 Hemorrhoids 01/21/2019  . Essential hypertension 01/21/2019  . Serum calcium elevated 01/21/2019  . Elevated cholesterol 01/19/2019  . Diabetes mellitus ([place]) 01/19/2019  . Trigger ring finger of left hand 01/11/2019  . Trigger finger of right thumb 01/11/2019  . 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 Acute bronchitis 08/08/2015  . Leukocytosis 08/08/2015  . Hyperglycemia 08/08/2015  . Tobacco abuse 08/08/2015  . 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 BACTERIAL PNEUMONIA, RIGHT LOWER LOBE 04/27/2008  . LUMP OR MASS IN BREAST 04/27/2008  . COUGH 04/27/2008    Jule Ser, PT 09/15/2019, 7:09 AM  [facility] [address], [place], [place], [address] Phone: [phone]   Fax:  [phone]  Name: Alisha Espinoza MRN: [id number] Date of Birth: 1965/04/05

## 2019-09-20 ENCOUNTER — Encounter (HOSPITAL_BASED_OUTPATIENT_CLINIC_OR_DEPARTMENT_OTHER)
Admission: RE | Admit: 2019-09-20 | Discharge: 2019-09-20 | Disposition: A | Discharge status: Home / Self Care | Payer: Medicaid Other | Source: Ambulatory Visit | Attending: Orthopedic Surgery | Admitting: Orthopedic Surgery

## 2019-09-20 ENCOUNTER — Telehealth: Payer: Self-pay | Admitting: *Deleted

## 2019-09-20 ENCOUNTER — Other Ambulatory Visit (HOSPITAL_COMMUNITY)
Admission: RE | Admit: 2019-09-20 | Discharge: 2019-09-20 | Disposition: A | Discharge status: Home / Self Care | Payer: Medicaid Other | Source: Ambulatory Visit | Attending: Orthopedic Surgery | Admitting: Orthopedic Surgery

## 2019-09-20 DIAGNOSIS — Z20822 Contact with and (suspected) exposure to covid-19: Secondary | ICD-10-CM | POA: Insufficient documentation

## 2019-09-20 DIAGNOSIS — Z01818 Encounter for other preprocedural examination: Secondary | ICD-10-CM | POA: Insufficient documentation

## 2019-09-20 LAB — SARS CORONAVIRUS 2 (TAT 6-24 HRS): SARS Coronavirus 2: NEGATIVE

## 2019-09-20 LAB — BASIC METABOLIC PANEL
Anion gap: 9 (ref 5–15)
BUN: 9 mg/dL (ref 6–20)
CO2: 25 mmol/L (ref 22–32)
Calcium: 9.7 mg/dL (ref 8.9–10.3)
Chloride: 105 mmol/L (ref 98–111)
Creatinine, Ser: 0.74 mg/dL (ref 0.44–1.00)
GFR calc Af Amer: >60 mL/min (ref >60)
GFR calc non Af Amer: >60 mL/min (ref >60)
Glucose, Bld: 126 mg/dL — ABNORMAL HIGH (ref 70–99)
Potassium: 3.8 mmol/L (ref 3.5–5.1)
Sodium: 139 mmol/L (ref 135–145)

## 2019-09-20 NOTE — Telephone Encounter (Signed)
Urine drug screen for this encounter is consistent for no controlled medication. 

## 2019-09-20 NOTE — Progress Notes (Signed)

## 2019-09-22 ENCOUNTER — Ambulatory Visit: Payer: Medicaid Other | Admitting: Physical Medicine and Rehabilitation

## 2019-09-23 ENCOUNTER — Ambulatory Visit (HOSPITAL_BASED_OUTPATIENT_CLINIC_OR_DEPARTMENT_OTHER)
Admission: RE | Admit: 2019-09-23 | Discharge: 2019-09-23 | Disposition: A | Discharge status: Home / Self Care | Payer: Medicaid Other | Attending: Orthopedic Surgery | Admitting: Orthopedic Surgery

## 2019-09-23 ENCOUNTER — Encounter (HOSPITAL_BASED_OUTPATIENT_CLINIC_OR_DEPARTMENT_OTHER): Payer: Self-pay | Admitting: Orthopedic Surgery

## 2019-09-23 ENCOUNTER — Ambulatory Visit (HOSPITAL_BASED_OUTPATIENT_CLINIC_OR_DEPARTMENT_OTHER): Payer: Medicaid Other | Admitting: Anesthesiology

## 2019-09-23 ENCOUNTER — Encounter (HOSPITAL_BASED_OUTPATIENT_CLINIC_OR_DEPARTMENT_OTHER)
Admission: RE | Disposition: A | Discharge status: Home / Self Care | Payer: Self-pay | Source: Home / Self Care | Attending: Orthopedic Surgery

## 2019-09-23 ENCOUNTER — Other Ambulatory Visit: Payer: Self-pay

## 2019-09-23 DIAGNOSIS — E119 Type 2 diabetes mellitus without complications: Secondary | ICD-10-CM | POA: Diagnosis not present

## 2019-09-23 DIAGNOSIS — E785 Hyperlipidemia, unspecified: Secondary | ICD-10-CM | POA: Insufficient documentation

## 2019-09-23 DIAGNOSIS — Z888 Allergy status to other drugs, medicaments and biological substances status: Secondary | ICD-10-CM | POA: Diagnosis not present

## 2019-09-23 DIAGNOSIS — Z7984 Long term (current) use of oral hypoglycemic drugs: Secondary | ICD-10-CM | POA: Diagnosis not present

## 2019-09-23 DIAGNOSIS — M65311 Trigger thumb, right thumb: Secondary | ICD-10-CM | POA: Diagnosis not present

## 2019-09-23 DIAGNOSIS — I10 Essential (primary) hypertension: Secondary | ICD-10-CM | POA: Diagnosis not present

## 2019-09-23 DIAGNOSIS — F17210 Nicotine dependence, cigarettes, uncomplicated: Secondary | ICD-10-CM | POA: Insufficient documentation

## 2019-09-23 DIAGNOSIS — M65341 Trigger finger, right ring finger: Secondary | ICD-10-CM | POA: Insufficient documentation

## 2019-09-23 DIAGNOSIS — M65331 Trigger finger, right middle finger: Secondary | ICD-10-CM | POA: Insufficient documentation

## 2019-09-23 DIAGNOSIS — Z79899 Other long term (current) drug therapy: Secondary | ICD-10-CM | POA: Insufficient documentation

## 2019-09-23 HISTORY — PX: TRIGGER FINGER RELEASE: SHX641

## 2019-09-23 LAB — GLUCOSE, CAPILLARY
Glucose-Capillary: 100 mg/dL — ABNORMAL HIGH (ref 70–99)
Glucose-Capillary: 115 mg/dL — ABNORMAL HIGH (ref 70–99)

## 2019-09-23 SURGERY — RELEASE, A1 PULLEY, FOR TRIGGER FINGER
Anesthesia: Monitor Anesthesia Care | Site: Hand | Laterality: Right

## 2019-09-23 MED ORDER — FENTANYL CITRATE (PF) 100 MCG/2ML IJ SOLN: 25.0000 ug | INTRAMUSCULAR | Status: DC | PRN

## 2019-09-23 MED ORDER — PROPOFOL 500 MG/50ML IV EMUL
INTRAVENOUS | Status: DC | PRN
  Administered 2019-09-23: 50 ug/kg/min via INTRAVENOUS

## 2019-09-23 MED ORDER — OXYCODONE HCL 5 MG PO TABS
5.0000 mg | ORAL_TABLET | Freq: Once | ORAL | Status: AC | PRN
  Administered 2019-09-23: 5 mg via ORAL

## 2019-09-23 MED ORDER — CEFAZOLIN SODIUM-DEXTROSE 2-4 GM/100ML-% IV SOLN
2.0000 g | INTRAVENOUS | Status: AC
  Administered 2019-09-23: 2 g via INTRAVENOUS

## 2019-09-23 MED ORDER — MIDAZOLAM HCL 2 MG/2ML IJ SOLN
INTRAMUSCULAR | Status: AC
  Filled 2019-09-23: qty 2

## 2019-09-23 MED ORDER — LIDOCAINE HCL (CARDIAC) PF 100 MG/5ML IV SOSY
PREFILLED_SYRINGE | INTRAVENOUS | Status: DC | PRN
  Administered 2019-09-23: 100 mg via INTRAVENOUS

## 2019-09-23 MED ORDER — HYDROCODONE-ACETAMINOPHEN 5-325 MG PO TABS: ORAL_TABLET | ORAL | 0 refills | Status: DC

## 2019-09-23 MED ORDER — CEFAZOLIN SODIUM-DEXTROSE 2-4 GM/100ML-% IV SOLN
INTRAVENOUS | Status: AC
  Filled 2019-09-23: qty 100

## 2019-09-23 MED ORDER — FENTANYL CITRATE (PF) 100 MCG/2ML IJ SOLN
INTRAMUSCULAR | Status: AC
  Filled 2019-09-23: qty 2

## 2019-09-23 MED ORDER — DEXAMETHASONE SODIUM PHOSPHATE 10 MG/ML IJ SOLN
INTRAMUSCULAR | Status: AC
  Filled 2019-09-23: qty 1

## 2019-09-23 MED ORDER — BUPIVACAINE HCL (PF) 0.5 % IJ SOLN
INTRAMUSCULAR | Status: DC | PRN
  Administered 2019-09-23: 8 mL

## 2019-09-23 MED ORDER — OXYCODONE HCL 5 MG/5ML PO SOLN: 5.0000 mg | Freq: Once | ORAL | Status: AC | PRN

## 2019-09-23 MED ORDER — OXYCODONE HCL 5 MG PO TABS
ORAL_TABLET | ORAL | Status: AC
  Filled 2019-09-23: qty 1

## 2019-09-23 MED ORDER — ONDANSETRON HCL 4 MG/2ML IJ SOLN: 4.0000 mg | Freq: Once | INTRAMUSCULAR | Status: DC | PRN

## 2019-09-23 MED ORDER — MIDAZOLAM HCL 5 MG/5ML IJ SOLN
INTRAMUSCULAR | Status: DC | PRN
  Administered 2019-09-23: 1 mg via INTRAVENOUS

## 2019-09-23 MED ORDER — FENTANYL CITRATE (PF) 100 MCG/2ML IJ SOLN
INTRAMUSCULAR | Status: DC | PRN
  Administered 2019-09-23: 50 ug via INTRAVENOUS

## 2019-09-23 MED ORDER — LIDOCAINE HCL (PF) 0.5 % IJ SOLN
INTRAMUSCULAR | Status: AC
  Filled 2019-09-23: qty 150

## 2019-09-23 MED ORDER — ONDANSETRON HCL 4 MG/2ML IJ SOLN
INTRAMUSCULAR | Status: AC
  Filled 2019-09-23: qty 2

## 2019-09-23 MED ORDER — LACTATED RINGERS IV SOLN: INTRAVENOUS | Status: DC

## 2019-09-23 MED ORDER — BUPIVACAINE HCL (PF) 0.25 % IJ SOLN: INTRAMUSCULAR | Status: DC | PRN

## 2019-09-23 MED ORDER — LIDOCAINE 2% (20 MG/ML) 5 ML SYRINGE
INTRAMUSCULAR | Status: AC
  Filled 2019-09-23: qty 5

## 2019-09-23 MED ORDER — 0.9 % SODIUM CHLORIDE (POUR BTL) OPTIME
TOPICAL | Status: DC | PRN
  Administered 2019-09-23: 200 mL

## 2019-09-23 SURGICAL SUPPLY — 29 items
BLADE SURG 15 STRL LF DISP TIS (BLADE) ×2 IMPLANT
BLADE SURG 15 STRL SS (BLADE) ×2
BNDG COHESIVE 2X5 TAN STRL LF (GAUZE/BANDAGES/DRESSINGS) ×2 IMPLANT
BNDG ESMARK 4X9 LF (GAUZE/BANDAGES/DRESSINGS) ×2 IMPLANT
CHLORAPREP W/TINT 26 (MISCELLANEOUS) ×2 IMPLANT
CORD BIPOLAR FORCEPS 12FT (ELECTRODE) ×2 IMPLANT
COVER BACK TABLE 60X90IN (DRAPES) ×2 IMPLANT
COVER MAYO STAND STRL (DRAPES) ×2 IMPLANT
COVER WAND RF STERILE (DRAPES) IMPLANT
CUFF TOURN SGL QUICK 18X4 (TOURNIQUET CUFF) ×2 IMPLANT
DRAPE EXTREMITY T 121X128X90 (DISPOSABLE) ×2 IMPLANT
DRAPE SURG 17X23 STRL (DRAPES) ×2 IMPLANT
GAUZE SPONGE 4X4 12PLY STRL (GAUZE/BANDAGES/DRESSINGS) ×2 IMPLANT
GAUZE XEROFORM 1X8 LF (GAUZE/BANDAGES/DRESSINGS) ×2 IMPLANT
GLOVE BIO SURGEON STRL SZ7.5 (GLOVE) ×2 IMPLANT
GLOVE BIOGEL PI IND STRL 8 (GLOVE) ×1 IMPLANT
GLOVE BIOGEL PI INDICATOR 8 (GLOVE) ×1
GOWN STRL REUS W/ TWL LRG LVL3 (GOWN DISPOSABLE) ×1 IMPLANT
GOWN STRL REUS W/TWL LRG LVL3 (GOWN DISPOSABLE) ×1
GOWN STRL REUS W/TWL XL LVL3 (GOWN DISPOSABLE) ×2 IMPLANT
NEEDLE HYPO 25X1 1.5 SAFETY (NEEDLE) ×2 IMPLANT
NS IRRIG 1000ML POUR BTL (IV SOLUTION) ×2 IMPLANT
SET BASIN DAY SURGERY F.S. (CUSTOM PROCEDURE TRAY) ×2 IMPLANT
STOCKINETTE 4X48 STRL (DRAPES) ×2 IMPLANT
SUT ETHILON 4 0 PS 2 18 (SUTURE) ×2 IMPLANT
SYR BULB EAR ULCER 3OZ GRN STR (SYRINGE) ×2 IMPLANT
SYR CONTROL 10ML LL (SYRINGE) ×2 IMPLANT
TOWEL GREEN STERILE FF (TOWEL DISPOSABLE) ×2 IMPLANT
UNDERPAD 30X36 HEAVY ABSORB (UNDERPADS AND DIAPERS) ×2 IMPLANT

## 2019-09-23 NOTE — Anesthesia Procedure Notes (Signed)
Anesthesia Regional Block: Bier block (IV Regional)   Pre-Anesthetic Checklist: ,, timeout performed, Correct Patient, Correct Site, Correct Laterality, Correct Procedure,, site marked, surgical consent,, at surgeon's request  Laterality: Right  Prep: alcohol swabs       Needles:  Injection technique: Single-shot  Needle Type: Other      Needle Gauge: 20     Additional Needles:   Procedures:,,,,, intact distal pulses, Esmarch exsanguination, single tourniquet utilized,  Narrative:  Start time: 09/23/2019 10:25 AM End time: 09/23/2019 10:26 AM Injection made incrementally with aspirations every 35 mL.  Performed by: Personally

## 2019-09-23 NOTE — Anesthesia Preprocedure Evaluation (Signed)
Anesthesia Evaluation  Patient identified by MRN, date of birth, ID band Patient awake    Reviewed: Allergy & Precautions, NPO status , Patient's Chart, lab work & pertinent test results  History of Anesthesia Complications Negative for: history of anesthetic complications  Airway Mallampati: II  TM Distance: >3 FB Neck ROM: Full    Dental   Pulmonary neg pulmonary ROS, Current Smoker and Patient abstained from smoking.,    Pulmonary exam normal        Cardiovascular hypertension, Normal cardiovascular exam     Neuro/Psych negative neurological ROS  negative psych ROS   GI/Hepatic negative GI ROS, Neg liver ROS,   Endo/Other  diabetes  Renal/GU negative Renal ROS  negative genitourinary   Musculoskeletal negative musculoskeletal ROS (+)   Abdominal   Peds  Hematology negative hematology ROS (+)   Anesthesia Other Findings   Reproductive/Obstetrics                            Anesthesia Physical Anesthesia Plan  ASA: II  Anesthesia Plan: MAC and Bier Block and Bier Block-LIDOCAINE ONLY   Post-op Pain Management:    Induction: Intravenous  PONV Risk Score and Plan: 2 and Propofol infusion, TIVA and Treatment may vary due to age or medical condition  Airway Management Planned: Natural Airway, Nasal Cannula and Simple Face Mask  Additional Equipment: None  Intra-op Plan:   Post-operative Plan:   Informed Consent: I have reviewed the patients History and Physical, chart, labs and discussed the procedure including the risks, benefits and alternatives for the proposed anesthesia with the patient or authorized representative who has indicated his/her understanding and acceptance.       Plan Discussed with:   Anesthesia Plan Comments:         Anesthesia Quick Evaluation

## 2019-09-23 NOTE — Transfer of Care (Signed)
Immediate Anesthesia Transfer of Care Note  Patient: Alisha Espinoza  Procedure(s) Performed: RELEASE TRIGGER FINGER/A-1 PULLEY RIGHT THUMB, LONG AND RING FINGERS (Right Hand)  Patient Location: PACU  Anesthesia Type:MAC and Bier block  Level of Consciousness: awake and drowsy  Airway & Oxygen Therapy: Patient Spontanous Breathing and Patient connected to face mask oxygen  Post-op Assessment: Report given to RN and Post -op Vital signs reviewed and stable  Post vital signs: Reviewed and stable  Last Vitals:  Vitals Value Taken Time  BP    Temp    Pulse 65 09/23/19 1103  Resp 17 09/23/19 1103  SpO2 95 % 09/23/19 1103  Vitals shown include unvalidated device data.  Last Pain:  Vitals:   09/23/19 0844  TempSrc: Oral  PainSc: 6       Patients Stated Pain Goal: 3 (91/50/56 9794)  Complications: No complications documented.

## 2019-09-23 NOTE — Discharge Instructions (Addendum)

## 2019-09-23 NOTE — Op Note (Addendum)
09/23/2019 [facility] OPERATIVE REPORT   PREOPERATIVE DIAGNOSIS: Right thumb, long, ring finger trigger digits  POSTOPERATIVE DIAGNOSIS:  Right thumb, long, ring finger trigger digits  PROCEDURE:  1. Right trigger thumb release. 2. Right long finger trigger release 3. Right ring finger trigger release  SURGEON:  Leanora Cover, MD  ASSISTANT:  None.  ANESTHESIA:  Bier block with sedation.  IV FLUIDS:  Per anesthesia flow sheet.  ESTIMATED BLOOD LOSS:  Minimal.  COMPLICATIONS:  None.  SPECIMENS:  None.  TOURNIQUET TIME:  Total Tourniquet Time Documented: Forearm (Right) - 31 minutes Total: Forearm (Right) - 31 minutes   DISPOSITION:  Stable to PACU.  LOCATION:  SURGERY CENTER  INDICATIONS: Alisha Espinoza is a 54 y.o. female with triggering right thumb, long, ring fingers.  These have been injected without lasting resolution.  She wishes to have trigger releases.  Risks, benefits and alternatives of surgery were discussed including the risk of blood loss, infection, damage to nerves, vessels, tendons, ligaments, bone, failure of surgery, need for additional surgery, complications with wound healing, continued pain, continued triggering and need for repeat surgery.  She voiced understanding of these risks and elected to proceed.  OPERATIVE COURSE:  After being identified preoperatively by myself, the patient and I agreed upon the procedure and site of procedure.  The surgical site was marked. The risks, benefits, and alternatives of surgery were reviewed and she wished to proceed.  Surgical consent had been signed. She was given IV Ancef as preoperative antibiotic prophylaxis. She was transported to the operating room and placed on the operating room table in supine position with the Right upper extremity on an arm board. Bier block anesthesia was induced by the anesthesiologist.  The Right upper extremity was prepped and draped in normal sterile orthopedic  fashion. A surgical pause was performed between surgeons, anesthesia, and operating room staff, and all were in agreement as to the patient, procedure, and site of procedure.  Tourniquet at the proximal aspect of the forearm had been inflated for the Bier block.  An incision was made transversely at the MP flexion crease of the thumb.  This was made through the skin only.  Spreading technique was used.  The radial and ulnar digital nerves were identified and were protected throughout the case. The flexor sheath was identified.  The A1 pulley was identified.  It was sharply incised.  It was released in its entirety.  Care was taken to avoid any release of the oblique pulley. The thumb was placed through a range of motion, there was noted to be no catching.  The wound was copiously irrigated with sterile saline. It was then closed with 4-0 nylon in a horizontal mattress fashion.  An incision was made at the volar aspect of the MP joint of the long finger.  This was carried into the subcutaneous tissues by preading technique.  Bipolar electrocautery was used to obtain hemostasis.  The radial and ulnar digital nerves were protected throughout the case. The flexor sheath was identified.  The A1 pulley was identified and sharply incised.  It was released in its entirety.  The proximal 1-2 mm of the A2 pulley was vented to allow better excursion of the tendons.  The finger was placed through a range of motion and there was noted to be no catching.  The tendons were brought through the wound and any adherences released.  The wound was then copiously irrigated with sterile saline.  An incision was made  at the volar aspect of the MP joint of the ring finger.  This was carried into the subcutaneous tissues by preading technique.  Bipolar electrocautery was used to obtain hemostasis.  The radial and ulnar digital nerves were protected throughout the case. The flexor sheath was identified.  The A1 pulley was identified and  sharply incised.  It was released in its entirety.  The proximal 1-2 mm of the A2 pulley was vented to allow better excursion of the tendons.  The finger was placed through a range of motion and there was noted to be no catching.  The tendons were brought through the wound and any adherences released.  The wounds were then copiously irrigated with sterile saline.  They were closed with 4-0 nylon in a horizontal mattress fashion. They were injected with  0.5% plain Marcaine to aid in postoperative analgesia.  They were then dressed with sterile Xeroform, 4x4s, and wrapped lightly with a Coban dressing.  Tourniquet was deflated at 31 minutes.  The fingertips were pink with brisk capillary refill after deflation of the tourniquet.  The operative drapes were broken down and the patient was awoken from anesthesia safely.  She was transferred back to the stretcher and taken to the PACU in stable condition.   I will see her back in the office in 1 week for postoperative followup.  I will give her a prescription for Norco 5/325 1-2 tabs PO q6 hours prn pain, dispense # 20.    Leanora Cover, MD Electronically signed, 09/23/19

## 2019-09-23 NOTE — H&P (Signed)
Alisha Espinoza is an 54 y.o. female.   Chief Complaint: trigger digits HPI: 54 yo female with triggering right thumb, long, and ring fingers.  These have been injected without lasting resolution.  She wishes to have trigger releases.  Allergies:  Allergies  Allergen Reactions  . Meloxicam Hives  . Mobic [Meloxicam]     Other reaction(s): Hives    Past Medical History:  Diagnosis Date  . Bronchitis   . Diabetes mellitus ([place]) 01/19/2019  . Dyspnea   . Heart murmur    resolved "closed by age 49."   . Hyperlipidemia   . Hypertension   . Seasonal allergies     Past Surgical History:  Procedure Laterality Date  . ABDOMINAL HYSTERECTOMY  2000  . ANTERIOR CERVICAL DECOMP/DISCECTOMY FUSION  03/19/2012   Procedure: ANTERIOR CERVICAL DECOMPRESSION/DISCECTOMY FUSION 1 LEVEL;  Surgeon: Melina Schools, MD;  Location: [facility];  Service: Orthopedics;  Laterality: Left;  Total Disc Replacement C4-5  . ANTERIOR CERVICAL DECOMP/DISCECTOMY FUSION N/A 05/07/2012   Procedure: ANTERIOR CERVICAL DECOMPRESSION/DISCECTOMY FUSION 1 LEVEL/HARDWARE REMOVAL;  Surgeon: Melina Schools, MD;  Location: [place];  Service: Orthopedics;  Laterality: N/A;  REMOVAL OF CERVICAL DISC REPLACEMENT AND ACDF C4-5  . BACK SURGERY    . CARPAL TUNNEL RELEASE Right 09/28/2018   Procedure: CARPAL TUNNEL RELEASE;  Surgeon: Eustace Moore, MD;  Location: [facility];  Service: Neurosurgery;  Laterality: Right;  CARPAL TUNNEL RELEASE  . CERVICAL FUSION  05/07/2012   Dr Rolena Infante  . COLONOSCOPY    . CYSTECTOMY     L wrist  . POLYPECTOMY    . ROTATOR CUFF REPAIR     Right  . TRIGGER FINGER RELEASE Left 05/03/2019   Procedure: LEFT LONG AND RING FINGER RELEASE TRIGGER FINGER/A-1 PULLEY;  Surgeon: Leanora Cover, MD;  Location: [facility];  Service: Orthopedics;  Laterality: Left;  beir block  . TYMPANOSTOMY TUBE PLACEMENT      Family History: Family History  Problem Relation Age of Onset  . Diabetes Mother   . Hypertension  Father   . Pancreatic cancer Sister   . Colon cancer Neg Hx   . Rectal cancer Neg Hx   . Stomach cancer Neg Hx   . Colon polyps Neg Hx   . Esophageal cancer Neg Hx     Social History:   reports that she has been smoking cigarettes. She has a 15.00 pack-year smoking history. She has never used smokeless tobacco. She reports current alcohol use. She reports that she does not use drugs.  Medications: Medications Prior to Admission  Medication Sig Dispense Refill  . albuterol (PROVENTIL HFA;VENTOLIN HFA) 108 (90 Base) MCG/ACT inhaler Inhale 1-2 puffs into the lungs every 4 (four) hours as needed for wheezing or shortness of breath (or coughing). 1 Inhaler 1  . Blood Glucose Monitoring Suppl (CONTOUR NEXT MONITOR) w/Device KIT 1 each by Does not apply route daily. Use to test blood sugars 1-2 times daily. 1 kit 0  . gabapentin (NEURONTIN) 300 MG capsule Take 1 capsule (300 mg total) by mouth 3 (three) times daily. 90 capsule 0  . glucose blood (CONTOUR NEXT TEST) test strip Use to test 1-2 times daily. 100 each 12  . ibuprofen (ADVIL) 800 MG tablet Take 1 tablet (800 mg total) by mouth every 8 (eight) hours as needed. 90 tablet 0  . LINZESS 290 MCG CAPS capsule TAKE 1 CAPSULE(290 MCG) BY MOUTH DAILY BEFORE BREAKFAST 30 capsule 5  . metFORMIN (GLUCOPHAGE) 500 MG  tablet Take 1 tablet (500 mg total) by mouth 2 (two) times daily with a meal. 180 tablet 3  . Microlet Lancets MISC Use to test blood sugars 1-2 times daily. 100 each 12  . simvastatin (ZOCOR) 20 MG tablet Take 20 mg by mouth daily.  1  . tiZANidine (ZANAFLEX) 4 MG tablet Take 1 tablet by mouth every 8 (eight) hours as needed for muscle spasms.     . traMADol (ULTRAM) 50 MG tablet Take 1 tablet (50 mg total) by mouth 2 (two) times daily as needed. 60 tablet 0  . valsartan-hydrochlorothiazide (DIOVAN-HCT) 160-12.5 MG tablet Take 1 tablet by mouth daily.    . hydrocortisone (ANUSOL-HC) 2.5 % rectal cream Place 1 application rectally 2  (two) times daily. 30 g 1  . tretinoin (RETIN-A) 0.1 % cream Apply 1 application topically at bedtime.       No results found for this or any previous visit (from the past 48 hour(s)).  No results found.   A comprehensive review of systems was negative.  Height _0  (1.676 m), weight 87 kg.  General appearance: alert, cooperative and appears stated age Head: Normocephalic, without obvious abnormality, atraumatic Neck: supple, symmetrical, trachea midline Cardio: regular rate and rhythm Resp: clear to auscultation bilaterally Extremities: Intact sensation and capillary refill all digits.  +epl/fpl/io.  No wounds.  Pulses: 2+ and symmetric Skin: Skin color, texture, turgor normal. No rashes or lesions Neurologic: Grossly normal Incision/Wound: none  Assessment/Plan Right thumb, long, and ring finger trigger digits.  Non operative and operative treatment options have been discussed with the patient and patient wishes to proceed with operative treatment. Risks, benefits, and alternatives of surgery have been discussed and the patient agrees with the plan of care.   Leanora Cover 09/23/2019, 8:43 AM

## 2019-09-23 NOTE — Anesthesia Postprocedure Evaluation (Signed)
Anesthesia Post Note  Patient: Alisha Espinoza  Procedure(s) Performed: RELEASE TRIGGER FINGER/A-1 PULLEY RIGHT THUMB, LONG AND RING FINGERS (Right Hand)     Patient location during evaluation: PACU Anesthesia Type: MAC Level of consciousness: awake and alert Pain management: pain level controlled Vital Signs Assessment: post-procedure vital signs reviewed and stable Respiratory status: spontaneous breathing, nonlabored ventilation and respiratory function stable Cardiovascular status: blood pressure returned to baseline and stable Postop Assessment: no apparent nausea or vomiting Anesthetic complications: no   No complications documented.  Last Vitals:  Vitals:   09/23/19 1130 09/23/19 1155  BP: 139/81 137/88  Pulse: 72 68  Resp: 15 16  Temp:  (!) 36.3 C  SpO2: 98% 99%    Last Pain:  Vitals:   09/23/19 1155  TempSrc:   PainSc: 4                  Lidia Collum

## 2019-09-24 ENCOUNTER — Encounter (HOSPITAL_BASED_OUTPATIENT_CLINIC_OR_DEPARTMENT_OTHER): Payer: Self-pay | Admitting: Orthopedic Surgery

## 2019-09-25 ENCOUNTER — Emergency Department (HOSPITAL_COMMUNITY): Payer: Medicaid Other

## 2019-09-25 ENCOUNTER — Other Ambulatory Visit: Payer: Self-pay

## 2019-09-25 ENCOUNTER — Emergency Department (HOSPITAL_COMMUNITY)
Admission: EM | Admit: 2019-09-25 | Discharge: 2019-09-25 | Disposition: A | Discharge status: Home / Self Care | Payer: Medicaid Other | Attending: Emergency Medicine | Admitting: Emergency Medicine

## 2019-09-25 DIAGNOSIS — F17210 Nicotine dependence, cigarettes, uncomplicated: Secondary | ICD-10-CM | POA: Diagnosis not present

## 2019-09-25 DIAGNOSIS — R109 Unspecified abdominal pain: Secondary | ICD-10-CM | POA: Diagnosis present

## 2019-09-25 DIAGNOSIS — K5900 Constipation, unspecified: Secondary | ICD-10-CM | POA: Diagnosis not present

## 2019-09-25 DIAGNOSIS — E1165 Type 2 diabetes mellitus with hyperglycemia: Secondary | ICD-10-CM | POA: Insufficient documentation

## 2019-09-25 DIAGNOSIS — Z79899 Other long term (current) drug therapy: Secondary | ICD-10-CM | POA: Insufficient documentation

## 2019-09-25 DIAGNOSIS — Z7984 Long term (current) use of oral hypoglycemic drugs: Secondary | ICD-10-CM | POA: Diagnosis not present

## 2019-09-25 DIAGNOSIS — I10 Essential (primary) hypertension: Secondary | ICD-10-CM | POA: Insufficient documentation

## 2019-09-25 DIAGNOSIS — R1084 Generalized abdominal pain: Secondary | ICD-10-CM

## 2019-09-25 LAB — CBC WITH DIFFERENTIAL/PLATELET
Abs Immature Granulocytes: 0.03 10*3/uL (ref 0.00–0.07)
Basophils Absolute: 0.1 10*3/uL (ref 0.0–0.1)
Basophils Relative: 1 %
Eosinophils Absolute: 0.2 10*3/uL (ref 0.0–0.5)
Eosinophils Relative: 3 %
HCT: 49.3 % — ABNORMAL HIGH (ref 36.0–46.0)
Hemoglobin: 15.8 g/dL — ABNORMAL HIGH (ref 12.0–15.0)
Immature Granulocytes: 0 %
Lymphocytes Relative: 35 %
Lymphs Abs: 3.4 10*3/uL (ref 0.7–4.0)
MCH: 26.2 pg (ref 26.0–34.0)
MCHC: 32 g/dL (ref 30.0–36.0)
MCV: 81.9 fL (ref 80.0–100.0)
Monocytes Absolute: 0.6 10*3/uL (ref 0.1–1.0)
Monocytes Relative: 6 %
Neutro Abs: 5.5 10*3/uL (ref 1.7–7.7)
Neutrophils Relative %: 55 %
Platelets: 223 10*3/uL (ref 150–400)
RBC: 6.02 MIL/uL — ABNORMAL HIGH (ref 3.87–5.11)
RDW: 16.4 % — ABNORMAL HIGH (ref 11.5–15.5)
WBC: 9.8 10*3/uL (ref 4.0–10.5)
nRBC: 0 % (ref 0.0–0.2)

## 2019-09-25 LAB — COMPREHENSIVE METABOLIC PANEL
ALT: 27 U/L (ref 0–44)
AST: 26 U/L (ref 15–41)
Albumin: 4.6 g/dL (ref 3.5–5.0)
Alkaline Phosphatase: 70 U/L (ref 38–126)
Anion gap: 10 (ref 5–15)
BUN: 12 mg/dL (ref 6–20)
CO2: 25 mmol/L (ref 22–32)
Calcium: 9.8 mg/dL (ref 8.9–10.3)
Chloride: 106 mmol/L (ref 98–111)
Creatinine, Ser: 0.81 mg/dL (ref 0.44–1.00)
GFR calc Af Amer: >60 mL/min (ref >60)
GFR calc non Af Amer: >60 mL/min (ref >60)
Glucose, Bld: 105 mg/dL — ABNORMAL HIGH (ref 70–99)
Potassium: 4.2 mmol/L (ref 3.5–5.1)
Sodium: 141 mmol/L (ref 135–145)
Total Bilirubin: 0.8 mg/dL (ref 0.3–1.2)
Total Protein: 8 g/dL (ref 6.5–8.1)

## 2019-09-25 LAB — LIPASE, BLOOD: Lipase: 19 U/L (ref 11–51)

## 2019-09-25 MED ORDER — IOHEXOL 300 MG/ML  SOLN
100.0000 mL | Freq: Once | INTRAMUSCULAR | Status: AC | PRN
  Administered 2019-09-25: 100 mL via INTRAVENOUS

## 2019-09-25 MED ORDER — FENTANYL CITRATE (PF) 100 MCG/2ML IJ SOLN
50.0000 ug | Freq: Once | INTRAMUSCULAR | Status: AC
  Administered 2019-09-25: 50 ug via INTRAVENOUS
  Filled 2019-09-25: qty 2

## 2019-09-25 MED ORDER — POLYETHYLENE GLYCOL 3350 17 GM/SCOOP PO POWD: 1.0000 | Freq: Once | ORAL | 0 refills | Status: AC

## 2019-09-25 MED ORDER — ONDANSETRON 4 MG PO TBDP
4.0000 mg | ORAL_TABLET | Freq: Three times a day (TID) | ORAL | 0 refills | Status: DC | PRN
Start: 2019-09-25 — End: 2019-12-10

## 2019-09-25 MED ORDER — SODIUM CHLORIDE (PF) 0.9 % IJ SOLN
INTRAMUSCULAR | Status: AC
  Filled 2019-09-25: qty 50

## 2019-09-25 NOTE — Discharge Instructions (Addendum)
Please drink plenty of fluid.  I will also prescribe you a MiraLAX cleanout.  You can buy MiraLAX over-the-counter please do so on the way home tonight.  Cleanout:  Miralax cleanout 5 capfuls in 24-36 oz gatorade, drink over 4-6 hrs. If stool is not clear after 24 hours, then repeat this dose for a second day.   After Cleanout:  Give Miralax 1 capful in 8 oz juice or gatorade daily for at least 4-6 weeks.  Schedule follow up with pediatrician if no improvement in constipation in 1-2 months, sooner if not resolved after cleanout.  If possible please stop taking the opioid pain medication as this is likely contributing/causing her symptoms.  He may use Tylenol 1000 mg per 6 hours.  Please drink plenty of water.  You may also use ibuprofen 400 mg in between doses of Tylenol. I have prescribed you some Zofran for nausea as you may have this.  However I recommend return to the emergency department if you start having intractable vomiting that is not improved with Zofran.

## 2019-09-25 NOTE — ED Provider Notes (Signed)
[facility] DEPT Provider Note   CSN: [id number] Arrival date & time: 09/25/19  1246     History Chief Complaint  Patient presents with  . Constipation    Alisha Espinoza is a 54 y.o. female.  HPI   Patient presents to the emergency department with chief complaint of abdominal pain that is been going on since Thursday.  Patient recently had surgery on her right hand on Wednesday and was placed on oxycodone.  She states she has been unable to pass stool for the last 4 days, she denies nausea, vomiting and but admits to passing gas and is tolerating p.o.  She describes her abdominal pain as episodic, diffuse crampy pain in her abdomen.  She also admits that she has hemorrhoids which were under control but since the surgery she has rectal pain which she thinks is from constipation. Patient denies any alleviating or aggravating factors.  Patient has been taking at home laxatives as well as suppositories without any relief.  Patient does not have any significant medical history, she had hysterectomy many years ago denies any other surgical history.  Patient denies fever, chills, cough, congestion, chest pain, shortness of breath, nausea, vomiting, difficulty urinating, pedal edema.  Past Medical History:  Diagnosis Date  . Bronchitis   . Diabetes mellitus ([place]) 01/19/2019  . Dyspnea   . Heart murmur    resolved "closed by age 33."   . Hyperlipidemia   . Hypertension   . Seasonal allergies     Patient Active Problem List   Diagnosis Date Noted  . 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 Hemorrhoids 01/21/2019  . Essential hypertension 01/21/2019  . Serum calcium elevated 01/21/2019  . Elevated cholesterol 01/19/2019  . Diabetes mellitus ([place]) 01/19/2019  . Trigger ring finger of left hand 01/11/2019  . Trigger finger of right thumb 01/11/2019  . 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 Acute bronchitis 08/08/2015  . Leukocytosis 08/08/2015  . Hyperglycemia 08/08/2015  .  Tobacco abuse 08/08/2015  . 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 BACTERIAL PNEUMONIA, RIGHT LOWER LOBE 04/27/2008  . LUMP OR MASS IN BREAST 04/27/2008  . COUGH 04/27/2008    Past Surgical History:  Procedure Laterality Date  . ABDOMINAL HYSTERECTOMY  2000  . ANTERIOR CERVICAL DECOMP/DISCECTOMY FUSION  03/19/2012   Procedure: ANTERIOR CERVICAL DECOMPRESSION/DISCECTOMY FUSION 1 LEVEL;  Surgeon: Melina Schools, MD;  Location: [facility];  Service: Orthopedics;  Laterality: Left;  Total Disc Replacement C4-5  . ANTERIOR CERVICAL DECOMP/DISCECTOMY FUSION N/A 05/07/2012   Procedure: ANTERIOR CERVICAL DECOMPRESSION/DISCECTOMY FUSION 1 LEVEL/HARDWARE REMOVAL;  Surgeon: Melina Schools, MD;  Location: [place];  Service: Orthopedics;  Laterality: N/A;  REMOVAL OF CERVICAL DISC REPLACEMENT AND ACDF C4-5  . BACK SURGERY    . CARPAL TUNNEL RELEASE Right 09/28/2018   Procedure: CARPAL TUNNEL RELEASE;  Surgeon: Eustace Moore, MD;  Location: [facility];  Service: Neurosurgery;  Laterality: Right;  CARPAL TUNNEL RELEASE  . CERVICAL FUSION  05/07/2012   Dr Rolena Infante  . COLONOSCOPY    . CYSTECTOMY     L wrist  . POLYPECTOMY    . ROTATOR CUFF REPAIR     Right  . TRIGGER FINGER RELEASE Left 05/03/2019   Procedure: LEFT LONG AND RING FINGER RELEASE TRIGGER FINGER/A-1 PULLEY;  Surgeon: Leanora Cover, MD;  Location: [facility];  Service: Orthopedics;  Laterality: Left;  beir block  . TRIGGER FINGER RELEASE Right 09/23/2019   Procedure: RELEASE TRIGGER FINGER/A-1 PULLEY RIGHT THUMB, LONG AND  RING FINGERS;  Surgeon: Leanora Cover, MD;  Location: [facility];  Service: Orthopedics;  Laterality: Right;  . TYMPANOSTOMY TUBE PLACEMENT       OB History   No obstetric history on file.     Family History  Problem Relation Age of Onset  . Diabetes Mother   . Hypertension Father   . Pancreatic cancer Sister   . Colon cancer Neg Hx   . Rectal cancer Neg Hx   . Stomach cancer Neg Hx   .  Colon polyps Neg Hx   . Esophageal cancer Neg Hx     Social History   Tobacco Use  . Smoking status: Current Every Day Smoker    Packs/day: 0.50    Years: 30.00    Pack years: 15.00    Types: Cigarettes  . Smokeless tobacco: Never Used  . Tobacco comment: 10cigs/day  Vaping Use  . Vaping Use: Never used  Substance Use Topics  . Alcohol use: Yes    Comment: occasionally  . Drug use: No    Home Medications Prior to Admission medications   Medication Sig Start Date End Date Taking? Authorizing Provider  albuterol (PROVENTIL HFA;VENTOLIN HFA) 108 (90 Base) MCG/ACT inhaler Inhale 1-2 puffs into the lungs every 4 (four) hours as needed for wheezing or shortness of breath (or coughing). 8/89/16  Yes Delora Fuel, MD  gabapentin (NEURONTIN) 300 MG capsule Take 1 capsule (300 mg total) by mouth 3 (three) times daily. 09/09/19  Yes Raulkar, Clide Deutscher, MD  hydrocortisone (ANUSOL-HC) 2.5 % rectal cream Place 1 application rectally 2 (two) times daily. Patient taking differently: Place 1 application rectally 2 (two) times daily as needed for hemorrhoids or anal itching.  07/02/19  Yes Gatha Mayer, MD  ibuprofen (ADVIL) 800 MG tablet Take 1 tablet (800 mg total) by mouth every 8 (eight) hours as needed. Patient taking differently: Take 800 mg by mouth every 8 (eight) hours as needed for moderate pain.  09/09/19  Yes Raulkar, Clide Deutscher, MD  LINZESS 290 MCG CAPS capsule TAKE 1 CAPSULE(290 MCG) BY MOUTH DAILY BEFORE BREAKFAST Patient taking differently: Take 290 mcg by mouth daily as needed (abdominal cramping).  07/28/19  Yes Mauri Pole, MD  metFORMIN (GLUCOPHAGE) 500 MG tablet Take 1 tablet (500 mg total) by mouth 2 (two) times daily with a meal. 01/21/19  Yes Libby Maw, MD  simvastatin (ZOCOR) 20 MG tablet Take 20 mg by mouth daily. 01/26/18  Yes [provider]  tiZANidine (ZANAFLEX) 4 MG tablet Take 1 tablet by mouth every 8 (eight) hours as needed for muscle  spasms.  01/04/19  Yes [provider]  tretinoin (RETIN-A) 0.1 % cream Apply 1 application topically at bedtime.  02/17/19  Yes [provider]  valsartan-hydrochlorothiazide (DIOVAN-HCT) 160-12.5 MG tablet Take 1 tablet by mouth daily. 02/08/19  Yes [provider]  Blood Glucose Monitoring Suppl (CONTOUR NEXT MONITOR) w/Device KIT 1 each by Does not apply route daily. Use to test blood sugars 1-2 times daily. 02/02/19   Libby Maw, MD  glucose blood (CONTOUR NEXT TEST) test strip Use to test 1-2 times daily. 02/02/19   Libby Maw, MD  HYDROcodone-acetaminophen [facility]) 5-325 MG tablet 1-2 tabs po q6 hours prn pain Patient not taking: Reported on 09/25/2019 09/23/19   Leanora Cover, MD  Microlet Lancets MISC Use to test blood sugars 1-2 times daily. 02/02/19   Libby Maw, MD    Allergies    Meloxicam  and Mobic [meloxicam]  Review of Systems   Review of Systems  Constitutional: Negative for chills and fever.  HENT: Negative for congestion, sore throat and trouble swallowing.   Eyes: Negative for pain.  Respiratory: Negative for cough and shortness of breath.   Cardiovascular: Negative for chest pain and leg swelling.  Gastrointestinal: Positive for abdominal distention, abdominal pain, constipation and rectal pain. Negative for diarrhea, nausea and vomiting.  Genitourinary: Negative for dysuria, enuresis, flank pain and pelvic pain.  Musculoskeletal: Negative for back pain and joint swelling.  Skin: Negative for rash.  Neurological: Negative for dizziness and headaches.  Hematological: Does not bruise/bleed easily.    Physical Exam Updated Vital Signs BP 116/69   Pulse 82   Temp 98.1 F (36.7 C) (Oral)   Resp 17   Ht _0  (1.676 m)   Wt 88.7 kg   SpO2 95%   BMI 31.56 kg/m   Physical Exam Vitals and nursing note reviewed.  Constitutional:      General: She is not in acute distress.    Appearance: She is not  ill-appearing.  HENT:     Head: Normocephalic and atraumatic.     Nose: No congestion.     Mouth/Throat:     Mouth: Mucous membranes are moist.     Pharynx: Oropharynx is clear.  Eyes:     General: No scleral icterus. Cardiovascular:     Rate and Rhythm: Normal rate and regular rhythm.     Pulses: Normal pulses.     Heart sounds: No murmur heard.  No friction rub. No gallop.   Pulmonary:     Effort: No respiratory distress.     Breath sounds: No wheezing, rhonchi or rales.  Abdominal:     General: There is no distension.     Tenderness: There is no abdominal tenderness. There is no guarding.     Comments: Patient's abdomen was visualized, distended, normal active bowel sounds, tympanic to percussion, diffuse tenderness, negative rebound tenderness, negative Murphy sign, no peritoneal sign present  Musculoskeletal:        General: No swelling.     Comments: Patient's right hand is wrapped in a bandage from her surgery.  Skin:    General: Skin is warm and dry.     Findings: No rash.  Neurological:     Mental Status: She is alert.  Psychiatric:        Mood and Affect: Mood normal.     ED Results / Procedures / Treatments   Labs (all labs ordered are listed, but only abnormal results are displayed) Labs Reviewed  COMPREHENSIVE METABOLIC PANEL - Abnormal; Notable for the following components:      Result Value   Glucose, Bld 105 (*)    All other components within normal limits  CBC WITH DIFFERENTIAL/PLATELET - Abnormal; Notable for the following components:   RBC 6.02 (*)    Hemoglobin 15.8 (*)    HCT 49.3 (*)    RDW 16.4 (*)    All other components within normal limits  LIPASE, BLOOD  URINALYSIS, ROUTINE W REFLEX MICROSCOPIC    EKG None  Radiology No results found.  Procedures Procedures (including critical care time)  Medications Ordered in ED Medications  sodium chloride (PF) 0.9 % injection (has no administration in time range)  fentaNYL (SUBLIMAZE)  injection 50 mcg (50 mcg Intravenous Given 09/25/19 1639)  iohexol (OMNIPAQUE) 300 MG/ML solution 100 mL (100 mLs Intravenous Contrast Given 09/25/19 1739)    ED Course  I have reviewed the triage vital signs and the nursing notes.  Pertinent labs & imaging results that were available during my care of the patient were reviewed by me and considered in my medical decision making (see chart for details).    MDM Rules/Calculators/A&P                          I have personally reviewed all imaging, labs and have interpreted them.  Due to patient's presentation most concerned for constipation versus bowel obstruction versus diverticulitis versus perforation. Unlikely patient suffering from pancreatitis as she has low risk factors, nondiabetic, not a heavy drinker, no history of stones, lipase was 19.  Patient's CBC does not show leukocytosis or anemia.  CMP does not show any electrolyte abnormalities, no signs of AKI, no elevated liver enzymes.  CT abdomen pelvis was ordered for further evaluation of distended abdomen.  Differential includes constipation versus small bowel obstruction versus perforation versus diverticulitis.  If CT scan is unremarkable patient can be discharged with laxatives and follow-up closely with primary care.  If patient CT scan shows small bowel obstruction or other acute abnormalities consult general surgery for possible admission for ops.  Patient will be handed over to [facility], he was briefed on patient given hospital course.   Final Clinical Impression(s) / ED Diagnoses Final diagnoses:  Constipation, unspecified constipation type  Generalized abdominal pain    Rx / DC Orders ED Discharge Orders    None       Marcello Fennel, PA-C 09/25/19 1803    Lucrezia Starch, MD 09/25/19 1919

## 2019-09-25 NOTE — ED Provider Notes (Signed)
Accepted handoff at shift change from [facility]. Please see prior provider note for more detail.   Briefly: Patient is 54 y.o.   DDX: concern for diverticulitis versus SBO  Plan: Discharged with laxatives and PCP follow-up if CT negative.  If CT positive disposition appropriately.  Physical Exam  BP 116/69   Pulse 82   Temp 98.1 F (36.7 C) (Oral)   Resp 17   Ht 5\' 6"  (1.676 m)   Wt 88.7 kg   SpO2 95%   BMI 31.56 kg/m   CONSTITUTIONAL:  well-appearing, NAD NEURO:  Alert and oriented x 3, no focal deficits EYES:  pupils equal and reactive ENT/NECK:  trachea midline, no JVD CARDIO:  reg rate, reg rhythm, well-perfused PULM:  None labored breathing GI/GU:  Abdomen is mildly distended, soft, diffusely tender with palpation.  No guarding or rebound. MSK/SPINE:  No gross deformities, no edema SKIN:  no rash obvious, atraumatic, no ecchymosis  PSYCH:  Appropriate speech and behavior  ED Course/Procedures     Procedures  Results for orders placed or performed during the hospital encounter of 09/25/19  Comprehensive metabolic panel  Result Value Ref Range   Sodium 141 135 - 145 mmol/L   Potassium 4.2 3.5 - 5.1 mmol/L   Chloride 106 98 - 111 mmol/L   CO2 25 22 - 32 mmol/L   Glucose, Bld 105 (H) 70 - 99 mg/dL   BUN 12 6 - 20 mg/dL   Creatinine, Ser 0.81 0.44 - 1.00 mg/dL   Calcium 9.8 8.9 - 10.3 mg/dL   Total Protein 8.0 6.5 - 8.1 g/dL   Albumin 4.6 3.5 - 5.0 g/dL   AST 26 15 - 41 U/L   ALT 27 0 - 44 U/L   Alkaline Phosphatase 70 38 - 126 U/L   Total Bilirubin 0.8 0.3 - 1.2 mg/dL   GFR calc non Af Amer >60 >60 mL/min   GFR calc Af Amer >60 >60 mL/min   Anion gap 10 5 - 15  CBC with Differential  Result Value Ref Range   WBC 9.8 4.0 - 10.5 K/uL   RBC 6.02 (H) 3.87 - 5.11 MIL/uL   Hemoglobin 15.8 (H) 12.0 - 15.0 g/dL   HCT 49.3 (H) 36 - 46 %   MCV 81.9 80.0 - 100.0 fL   MCH 26.2 26.0 - 34.0 pg   MCHC 32.0 30.0 - 36.0 g/dL   RDW 16.4 (H) 11.5 - 15.5 %    Platelets 223 150 - 400 K/uL   nRBC 0.0 0.0 - 0.2 %   Neutrophils Relative % 55 %   Neutro Abs 5.5 1.7 - 7.7 K/uL   Lymphocytes Relative 35 %   Lymphs Abs 3.4 0.7 - 4.0 K/uL   Monocytes Relative 6 %   Monocytes Absolute 0.6 0 - 1 K/uL   Eosinophils Relative 3 %   Eosinophils Absolute 0.2 0 - 0 K/uL   Basophils Relative 1 %   Basophils Absolute 0.1 0 - 0 K/uL   Immature Granulocytes 0 %   Abs Immature Granulocytes 0.03 0.00 - 0.07 K/uL  Lipase, blood  Result Value Ref Range   Lipase 19 11 - 51 U/L   CT Abdomen Pelvis W Contrast  Result Date: 09/25/2019 CLINICAL DATA:  Abdominal distension EXAM: CT ABDOMEN AND PELVIS WITH CONTRAST TECHNIQUE: Multidetector CT imaging of the abdomen and pelvis was performed using the standard protocol following bolus administration of intravenous contrast. CONTRAST:  123mL OMNIPAQUE IOHEXOL 300 MG/ML  SOLN  COMPARISON:  02/10/2019 FINDINGS: Lower chest: Lung bases are clear. No effusions. Heart is normal size. Hepatobiliary: No focal hepatic abnormality. Gallbladder unremarkable. Pancreas: No focal abnormality or ductal dilatation. Spleen: No focal abnormality.  Normal size. Adrenals/Urinary Tract: Low-density lesions within both adrenal glands most compatible with adenomas. Kidneys and urinary bladder unremarkable. No hydronephrosis. Stomach/Bowel: Colon is fluid and stool filled and moderately distended. Small bowel fluid-filled. Bowel is distended and fluid-filled to the rectum with no caliber change to suggest obstruction. Stomach decompressed, unremarkable. Normal appendix. Vascular/Lymphatic: Aortic atherosclerosis. No enlarged abdominal or pelvic lymph nodes. Reproductive: Prior hysterectomy.  No adnexal masses. Other: No free fluid or free air. Musculoskeletal: No acute bony abnormality. Postoperative changes in the lumbar spine IMPRESSION: Fluid-filled large and small bowel without transition. This continues to the rectum. No evidence for bowel obstruction.  Moderate stool burden also throughout the colon. Aortic atherosclerosis. Stable bilateral adrenal adenomas. Electronically Signed   By: Rolm Baptise M.D.   On: 09/25/2019 18:09     MDM   CT scan shows diffuse fluid-filled small bowel without transition.  This continues to the rectum.  There is no evidence of SBO.  Patient still passing gas.  She has moderate swelling throughout the colon but no evidence of obstruction.  On my reevaluation patient states that she has not eaten anything today and ate very little of the last couple days.  Suspect that patient has borderline ileus more likely constipation due to opioid use and recent surgery.  I discussed this case with my attending physician who cosigned this note including patient's presenting symptoms, physical exam, and planned diagnostics and interventions. Attending physician stated agreement with plan or made changes to plan which were implemented.   We will discharge with MiraLAX cleanout and maintenance MiraLAX.  We will also prescribe Zofran for nausea as needed.  Recommended strict return precautions to ED if any fever, nausea or vomiting or worsening symptoms.   Patient is understanding of plan is agreeable to discharge at this time.    Pati Gallo [place], [place] 09/25/19 1912    Lucrezia Starch, MD 09/25/19 1919

## 2019-09-25 NOTE — ED Notes (Signed)
Patient transported to CT 

## 2019-09-25 NOTE — ED Triage Notes (Signed)
Per ems, patient had surgery a few days ago, took oxycodone. Patient is now constipated. Patient reports her last bowel movement was Wednesday. Pain rated 10/10

## 2019-10-06 ENCOUNTER — Other Ambulatory Visit: Payer: Self-pay | Admitting: Physical Medicine and Rehabilitation

## 2019-10-08 ENCOUNTER — Other Ambulatory Visit: Payer: Self-pay

## 2019-10-08 ENCOUNTER — Encounter
Payer: Medicaid Other | Attending: Physical Medicine and Rehabilitation | Admitting: Physical Medicine and Rehabilitation

## 2019-10-08 ENCOUNTER — Telehealth: Payer: Self-pay | Admitting: *Deleted

## 2019-10-08 ENCOUNTER — Encounter: Payer: Self-pay | Admitting: Physical Medicine and Rehabilitation

## 2019-10-08 VITALS — BP 130/91 | HR 89 | Temp 97.6°F | Ht 61.0 in | Wt 194.4 lb

## 2019-10-08 DIAGNOSIS — K5903 Drug induced constipation: Secondary | ICD-10-CM | POA: Diagnosis not present

## 2019-10-08 DIAGNOSIS — Z79891 Long term (current) use of opiate analgesic: Secondary | ICD-10-CM | POA: Diagnosis present

## 2019-10-08 DIAGNOSIS — G894 Chronic pain syndrome: Secondary | ICD-10-CM

## 2019-10-08 DIAGNOSIS — Z5181 Encounter for therapeutic drug level monitoring: Secondary | ICD-10-CM | POA: Insufficient documentation

## 2019-10-08 DIAGNOSIS — M5417 Radiculopathy, lumbosacral region: Secondary | ICD-10-CM | POA: Diagnosis not present

## 2019-10-08 DIAGNOSIS — G4701 Insomnia due to medical condition: Secondary | ICD-10-CM

## 2019-10-08 MED ORDER — TRAMADOL HCL 50 MG PO TABS: 50.0000 mg | ORAL_TABLET | Freq: Two times a day (BID) | ORAL | 0 refills | Status: DC | PRN

## 2019-10-08 NOTE — Telephone Encounter (Signed)
Pharmacy only dispensed 10 tramadol. I called and spoke with pharmacy and she has new insurance [employer]) and most likely requires a PA to be done.

## 2019-10-08 NOTE — Progress Notes (Signed)
Subjective:    Patient ID: Alisha Espinoza, female    DOB: 1965-09-25, 54 y.o.   MRN: [id number]  HPI  Alisha Espinoza is a 54 year old woman who presents for follow-up of lower back pain that radiates into her right groin, anterior thigh, and her medial right ankle. Pain continues in the same distribution today.    So far for the pain she has tried walking and eating right. She can walk 1/2 mile, sometimes 1 mile. She sometimes has has to stop and stretch. She learned a lot from physical therapy last month and she does her exercises every day. She has been walking one lap around a track around 3-4 times per week and has to stop and stretch during this walk.     She has never gotten any spinal injections and is scared of this option at this time.    Her pain is 7/10, sharp and tingling.   Her MRI was canceled. Her insurance has changed so she would like to try again to have this done.    Imaging reviewed.  IMPRESSION: 1. Broad-based disc protrusion and bilateral facet hypertrophy at L3-4 results in severe right and moderate left subarticular narrowing. Moderate foraminal stenosis is present bilaterally. 2. Large right lateral disc protrusion at L4-5 with severe right and mild left subarticular narrowing. 3. Moderate right and mild left foraminal narrowing at L4-5. 4. Leftward disc protrusion at L5-S1 extends into the left neural foramen without significant stenosis. 5. Broad-based disc protrusion and mild bilateral facet hypertrophy at L2-3 with mild subarticular and foraminal narrowing bilaterally.   Tramadol has been helping with pain. Gabapentin makes her too sleepy during the day. She has also been taking Motrin 800mg  TID as needed and has not experienced any abdominal pain with this.   Continues to sleep poorly at night.   Had recent left hand surgery and is currently doing occupational therapy for this. Will have the stitches removed today. Took hydrocodone after this surgery and it  severely constipated her.   Pain Inventory Average Pain 7 Pain Right Now 6 My pain is intermittent, sharp and tingling  In the last 24 hours, has pain interfered with the following? General activity 5 Relation with others 7 Enjoyment of life 7 What TIME of day is your pain at its worst? All the time.  Sleep (in general) Poor  Pain is worse with: walking, bending and standing Pain improves with: rest and medication Relief from Meds: 8  Mobility walk without assistance how many minutes can you walk? 5 mins ability to climb steps?  yes do you drive?  yes Do you have any goals in this area?  yes  Function disabled: date disabled 2019 I need assistance with the following:  No help needed. Do you have any goals in this area?  yes  Neuro/Psych bowel control problems tingling trouble walking  Prior Studies Any changes since last visit?  no  Physicians involved in your care Any changes since last visit?  no   Family History  Problem Relation Age of Onset  . Diabetes Mother   . Hypertension Father   . Pancreatic cancer Sister   . Colon cancer Neg Hx   . Rectal cancer Neg Hx   . Stomach cancer Neg Hx   . Colon polyps Neg Hx   . Esophageal cancer Neg Hx    Social History   Socioeconomic History  . Marital status: Divorced    Spouse name: Not on file  . Number  of children: Not on file  . Years of education: Not on file  . Highest education level: Not on file  Occupational History  . Not on file  Tobacco Use  . Smoking status: Current Every Day Smoker    Packs/day: 0.50    Years: 30.00    Pack years: 15.00    Types: Cigarettes  . Smokeless tobacco: Never Used  . Tobacco comment: 10cigs/day  Vaping Use  . Vaping Use: Never used  Substance and Sexual Activity  . Alcohol use: Yes    Comment: occasionally  . Drug use: No  . Sexual activity: Yes    Birth control/protection: Surgical    Comment: Hysterectomy  Other Topics Concern  . Not on file  Social  History Narrative  . Not on file   Social Determinants of Health   Financial Resource Strain:   . Difficulty of Paying Living Expenses:   Food Insecurity:   . Worried About Charity fundraiser in the Last Year:   . Arboriculturist in the Last Year:   Transportation Needs:   . Film/video editor (Medical):   Marland Kitchen Lack of Transportation (Non-Medical):   Physical Activity:   . Days of Exercise per Week:   . Minutes of Exercise per Session:   Stress:   . Feeling of Stress :   Social Connections:   . Frequency of Communication with Friends and Family:   . Frequency of Social Gatherings with Friends and Family:   . Attends Religious Services:   . Active Member of Clubs or Organizations:   . Attends Archivist Meetings:   Marland Kitchen Marital Status:    Past Surgical History:  Procedure Laterality Date  . ABDOMINAL HYSTERECTOMY  2000  . ANTERIOR CERVICAL DECOMP/DISCECTOMY FUSION  03/19/2012   Procedure: ANTERIOR CERVICAL DECOMPRESSION/DISCECTOMY FUSION 1 LEVEL;  Surgeon: Melina Schools, MD;  Location: [facility];  Service: Orthopedics;  Laterality: Left;  Total Disc Replacement C4-5  . ANTERIOR CERVICAL DECOMP/DISCECTOMY FUSION N/A 05/07/2012   Procedure: ANTERIOR CERVICAL DECOMPRESSION/DISCECTOMY FUSION 1 LEVEL/HARDWARE REMOVAL;  Surgeon: Melina Schools, MD;  Location: [facility];  Service: Orthopedics;  Laterality: N/A;  REMOVAL OF CERVICAL DISC REPLACEMENT AND ACDF C4-5  . BACK SURGERY    . CARPAL TUNNEL RELEASE Right 09/28/2018   Procedure: CARPAL TUNNEL RELEASE;  Surgeon: Eustace Moore, MD;  Location: [facility];  Service: Neurosurgery;  Laterality: Right;  CARPAL TUNNEL RELEASE  . CERVICAL FUSION  05/07/2012   Dr Rolena Infante  . COLONOSCOPY    . CYSTECTOMY     L wrist  . POLYPECTOMY    . ROTATOR CUFF REPAIR     Right  . TRIGGER FINGER RELEASE Left 05/03/2019   Procedure: LEFT LONG AND RING FINGER RELEASE TRIGGER FINGER/A-1 PULLEY;  Surgeon: Leanora Cover, MD;  Location: [facility];   Service: Orthopedics;  Laterality: Left;  beir block  . TRIGGER FINGER RELEASE Right 09/23/2019   Procedure: RELEASE TRIGGER FINGER/A-1 PULLEY RIGHT THUMB, LONG AND RING FINGERS;  Surgeon: Leanora Cover, MD;  Location: [place];  Service: Orthopedics;  Laterality: Right;  . TYMPANOSTOMY TUBE PLACEMENT     Past Medical History:  Diagnosis Date  . Bronchitis   . Diabetes mellitus ([place]) 01/19/2019  . Dyspnea   . Heart murmur    resolved "closed by age 54."   . Hyperlipidemia   . Hypertension   . Seasonal allergies    Temp 97.6 F (36.4 C)   Ht 5'  1" (1.549 m)   Wt 194 lb 6.4 oz (88.2 kg)   BMI 36.73 kg/m   Opioid Risk Score:   Fall Risk Score:  `1  Depression screen PHQ 2/9  Depression screen [facility] 2/9 10/08/2019 08/24/2019 01/25/2019  Decreased Interest 0 2 0  Down, Depressed, Hopeless 0 2 1  PHQ - 2 Score 0 4 1  Altered sleeping - 3 -  Tired, decreased energy - 1 -  Change in appetite - 1 -  Feeling bad or failure about yourself  - 1 -  Trouble concentrating - 1 -  Moving slowly or fidgety/restless - 3 -  Suicidal thoughts - 0 -  PHQ-9 Score - 14 -  Difficult doing work/chores - Somewhat difficult -  Some recent data might be hidden    Review of Systems  Constitutional: Negative.   HENT: Negative.   Eyes: Negative.   Respiratory: Negative.   Cardiovascular: Negative.   Gastrointestinal: Positive for constipation.  Endocrine: Negative.        High blood sugar  Genitourinary: Negative.   Musculoskeletal: Positive for arthralgias, back pain and gait problem.  Skin: Negative.   Allergic/Immunologic: Negative.   Neurological: Positive for weakness and numbness.       Tingling  Hematological: Negative.   Psychiatric/Behavioral: Negative.   All other systems reviewed and are negative.      Objective:   Physical Exam Gen: no distress, normal appearing HEENT: oral mucosa pink and moist, NCAT Cardio: Reg rate Chest: normal effort, normal rate of  breathing Abd: soft, non-distended Ext: no edema Skin: left hand with stitches. Healing well. Neuro: Aox3 Musculoskeletal: 5/5 strength throughout, sensation intact, gait normal, + slump test on left and negative on right. Painless extension, painful flexion.  Psych: pleasant, normal affect      Assessment & Plan:  Alisha Espinoza is a 54 year old woman who presents with bilateral lower extremity lumbosacral back pain with radiculopathy. Also contributing to insomnia.    -MRI reviewed as above with patient. Ordered new MRI for her today.  -Commended on her doing her HEP every day! Advised to continue walking as much as possible for pain relief (improves her pain), and general health. -Continue Gabapentin 300mg  at night, can increase to 400mg  for better sleep. Does not tolerate during day.  -Prescribed Motrin 800mg  to be taken up to three times as needed. Stop Celebrex while taking this. Denies pain in the belly with that.  -Educed regarding spinal epidural, which she may consider in the future. -Pain contract and UDS signed previously. Tramadol 50mg  BID PRN 60 pills sent.   Constipation: Takes laxative every morning and tries to eat high fiber. Eats prunes every day. She drinks coffee, eats meat. Will continue to monitor. Minimize carbs. Likely secondary to the hydrocodone she needed after hand surgery.   All questions answered. RTC in 1 month.

## 2019-10-11 ENCOUNTER — Other Ambulatory Visit: Payer: Self-pay | Admitting: Family Medicine

## 2019-10-11 DIAGNOSIS — Z1231 Encounter for screening mammogram for malignant neoplasm of breast: Secondary | ICD-10-CM

## 2019-10-11 DIAGNOSIS — N63 Unspecified lump in unspecified breast: Secondary | ICD-10-CM

## 2019-10-12 ENCOUNTER — Other Ambulatory Visit: Payer: Self-pay | Admitting: Family Medicine

## 2019-10-12 DIAGNOSIS — N63 Unspecified lump in unspecified breast: Secondary | ICD-10-CM

## 2019-10-12 NOTE — Telephone Encounter (Addendum)
Prior authorization submitted to private medicaid carrier, [employer].  Approved   10/12/2019 - 04/13/2020

## 2019-10-14 ENCOUNTER — Ambulatory Visit: Payer: Medicaid Other | Attending: Gastroenterology | Admitting: Physical Therapy

## 2019-10-14 ENCOUNTER — Encounter: Payer: Self-pay | Admitting: Physical Therapy

## 2019-10-14 DIAGNOSIS — M62838 Other muscle spasm: Secondary | ICD-10-CM | POA: Diagnosis present

## 2019-10-14 DIAGNOSIS — R278 Other lack of coordination: Secondary | ICD-10-CM | POA: Diagnosis present

## 2019-10-14 NOTE — Therapy (Signed)
[facility] [address] Al[place]ino[place], [place], [address] Phone: [phone]   Fax:  [phone]  Physical Therapy Treatment  Patient Details  Name: Alisha Espinoza MRN: [id number] Date of Birth: July 28, 1965 Referring Provider (PT): Mauri Pole, MD   Encounter Date: 10/14/2019   PT End of Session - 10/14/19 1137    Visit Number 5    Date for PT Re-Evaluation 11/05/19    Authorization Type out of network    PT Start Time 1101    PT Stop Time 1143    PT Time Calculation (min) 42 min    Activity Tolerance Patient limited by pain;Patient tolerated treatment well    Behavior During Therapy [facility] for tasks assessed/performed           Past Medical History:  Diagnosis Date  . Bronchitis   . Diabetes mellitus ([place]) 01/19/2019  . Dyspnea   . Heart murmur    resolved "closed by age 10."   . Hyperlipidemia   . Hypertension   . Seasonal allergies     Past Surgical History:  Procedure Laterality Date  . ABDOMINAL HYSTERECTOMY  2000  . ANTERIOR CERVICAL DECOMP/DISCECTOMY FUSION  03/19/2012   Procedure: ANTERIOR CERVICAL DECOMPRESSION/DISCECTOMY FUSION 1 LEVEL;  Surgeon: Melina Schools, MD;  Location: [place];  Service: Orthopedics;  Laterality: Left;  Total Disc Replacement C4-5  . ANTERIOR CERVICAL DECOMP/DISCECTOMY FUSION N/A 05/07/2012   Procedure: ANTERIOR CERVICAL DECOMPRESSION/DISCECTOMY FUSION 1 LEVEL/HARDWARE REMOVAL;  Surgeon: Melina Schools, MD;  Location: [place];  Service: Orthopedics;  Laterality: N/A;  REMOVAL OF CERVICAL DISC REPLACEMENT AND ACDF C4-5  . BACK SURGERY    . CARPAL TUNNEL RELEASE Right 09/28/2018   Procedure: CARPAL TUNNEL RELEASE;  Surgeon: Eustace Moore, MD;  Location: [place];  Service: Neurosurgery;  Laterality: Right;  CARPAL TUNNEL RELEASE  . CERVICAL FUSION  05/07/2012   Dr Rolena Infante  . COLONOSCOPY    . CYSTECTOMY     L wrist  . POLYPECTOMY    . ROTATOR CUFF REPAIR     Right  . TRIGGER  FINGER RELEASE Left 05/03/2019   Procedure: LEFT LONG AND RING FINGER RELEASE TRIGGER FINGER/A-1 PULLEY;  Surgeon: Leanora Cover, MD;  Location: [facility];  Service: Orthopedics;  Laterality: Left;  beir block  . TRIGGER FINGER RELEASE Right 09/23/2019   Procedure: RELEASE TRIGGER FINGER/A-1 PULLEY RIGHT THUMB, LONG AND RING FINGERS;  Surgeon: Leanora Cover, MD;  Location: [facility];  Service: Orthopedics;  Laterality: Right;  . TYMPANOSTOMY TUBE PLACEMENT      There were no vitals filed for this visit.   Subjective Assessment - 10/14/19 1104    Subjective Pt states overall feeling about 60% better.  States it is fine after using the restroom and showering, cream and pain pill.  Before that it really hurts.  States her stool is mostly runny.    Patient Stated Goals be able to have more solid stool and be able to pass it without pain    Currently in Pain? No/denies                             [name] Adult PT Treatment/Exercise - 10/14/19 0001      Lumbar Exercises: Aerobic   Nustep L3 x 6 min      Manual Therapy   Manual therapy comments sidelying - used addaday    Soft tissue mobilization thoracic lumbar  paraspinals and gluteals bilateral            Trigger Point Dry Needling - 10/14/19 0001    Consent Given? Yes    Education Handout Provided Previously provided    Muscles Treated Back/Hip Gluteus minimus;Gluteus medius    Gluteus Minimus Response Twitch response elicited;Palpable increased muscle length    Gluteus Medius Response Twitch response elicited;Palpable increased muscle length                  PT Short Term Goals - 09/14/19 1218      PT SHORT TERM GOAL #1   Title She will be indpendent with initial HEP    Status Achieved      PT SHORT TERM GOAL #2   Title She will report at least 25% less pain with a BM    Baseline 10% improved    Status On-going      PT SHORT TERM GOAL #3   Title Pt will report BM is  more normal consistency of 3-5 on [place] stool chart    Baseline hard to tell    Status On-going             PT Long Term Goals - 09/14/19 1220      PT LONG TERM GOAL #1   Title She will be indpendent with all HEP issued    Status On-going      PT LONG TERM GOAL #2   Title She will report pain with BM decreased 50% or more    Baseline 10% better and it doesn't hurt for as much time    Status On-going      PT LONG TERM GOAL #3   Title Pt will be able to perform breathing techniques and bulge pelvic floor so she can improved muscle elasticity for better bowel movements      PT LONG TERM GOAL #4   Title Pt will be able to perform transfers and lifting techniques correctly without using valsalva maneuver    Status On-going      PT LONG TERM GOAL #5   Title Increase bilat. hip extension strength to 5/5 MMT to improve ability for lifting for chores    Status On-going                 Plan - 10/14/19 1226    Clinical Impression Statement Pt had lapse in treatment due to scheduling. She has been doing well with adding the exercises into her routine and reports 60% improved so far.  PT focused on fascial and soft tissue release.  Tight and tender trigger points especially in Rt gluteals. Dry needling was performed in this area to release and pt had good release with dry needling added to STM.  Pt will benefit from skilled PT to continue to work on strength and posture with pain management.    PT Treatment/Interventions Electrical Stimulation;Iontophoresis 4mg /ml Dexamethasone;Moist Heat;Therapeutic exercise;Therapeutic activities;Patient/family education;Passive range of motion;Dry needling;Manual techniques;Taping;ADLs/Self Care Home Management;Biofeedback;Cryotherapy;Neuromuscular re-education    PT Next Visit Plan progress core strength; gluteal and lumbar stretches; f/u on self massage and toileting as needed    PT Home Exercise Plan [id number]    Consulted and Agree with Plan  of Care Patient           Patient will benefit from skilled therapeutic intervention in order to improve the following deficits and impairments:  Pain, Increased muscle spasms, Decreased strength, Decreased activity tolerance, Difficulty walking, Impaired tone, Decreased coordination  Visit Diagnosis: Other  muscle spasm  Other lack of coordination     Problem List Patient Active Problem List   Diagnosis Date Noted  . 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 Hemorrhoids 01/21/2019  . Essential hypertension 01/21/2019  . Serum calcium elevated 01/21/2019  . Elevated cholesterol 01/19/2019  . Diabetes mellitus ([place]) 01/19/2019  . Trigger ring finger of left hand 01/11/2019  . Trigger finger of right thumb 01/11/2019  . 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 Acute bronchitis 08/08/2015  . Leukocytosis 08/08/2015  . Hyperglycemia 08/08/2015  . Tobacco abuse 08/08/2015  . 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 BACTERIAL PNEUMONIA, RIGHT LOWER LOBE 04/27/2008  . LUMP OR MASS IN BREAST 04/27/2008  . COUGH 04/27/2008    Jule Ser, PT 10/14/2019, 2:06 PM  [facility] [address], [place], [place], [address] Phone: [phone]   Fax:  [phone]  Name: NADINA FOMBY MRN: [id number] Date of Birth: 1965-06-08

## 2019-10-18 ENCOUNTER — Other Ambulatory Visit: Payer: Self-pay | Admitting: Physical Medicine and Rehabilitation

## 2019-10-18 ENCOUNTER — Ambulatory Visit: Payer: Self-pay | Admitting: General Surgery

## 2019-10-19 ENCOUNTER — Telehealth: Payer: Self-pay

## 2019-10-19 ENCOUNTER — Other Ambulatory Visit: Payer: Self-pay | Admitting: Physical Medicine and Rehabilitation

## 2019-10-19 MED ORDER — TRAMADOL HCL 50 MG PO TABS: 50.0000 mg | ORAL_TABLET | Freq: Two times a day (BID) | ORAL | 0 refills | Status: DC | PRN

## 2019-10-19 NOTE — Telephone Encounter (Signed)
Patient was only able to get a 7 day supply of Tramadol to await the prior authorization. Tramadol has been approved. Need new prescription sent in the get the rest of the month Tramadol.

## 2019-10-19 NOTE — Telephone Encounter (Signed)
Patient notified

## 2019-10-19 NOTE — Telephone Encounter (Signed)
Sent, thanks

## 2019-10-19 NOTE — Telephone Encounter (Signed)
Last clinic note indicates gabapentin 300 mg QHS, not 3 times daily

## 2019-10-20 ENCOUNTER — Telehealth: Payer: Self-pay | Admitting: *Deleted

## 2019-10-20 NOTE — Telephone Encounter (Signed)
Prior authorization tramadol approved from 10/12/2019 - 04/13/2020

## 2019-10-21 ENCOUNTER — Encounter: Payer: Medicaid Other | Admitting: Physical Therapy

## 2019-10-26 ENCOUNTER — Other Ambulatory Visit: Payer: Self-pay | Admitting: Physical Medicine and Rehabilitation

## 2019-10-26 ENCOUNTER — Telehealth: Payer: Self-pay | Admitting: *Deleted

## 2019-10-26 MED ORDER — GABAPENTIN 400 MG PO CAPS
400.0000 mg | ORAL_CAPSULE | Freq: Three times a day (TID) | ORAL | 2 refills | Status: DC
Start: 2019-10-26 — End: 2020-01-25

## 2019-10-26 NOTE — Telephone Encounter (Signed)
Patient left a message asking for a call back regarding uncontrolled pain.   I contacted the patient and she states that tramadol, ibuprofen and gabapentin are not helping with her pain.  She states it is predominantly in her calf and thigh.  She believes it is sciatica related.  Please contact patient

## 2019-10-28 ENCOUNTER — Ambulatory Visit: Payer: Medicaid Other | Admitting: Physical Therapy

## 2019-10-28 ENCOUNTER — Encounter: Payer: Self-pay | Admitting: Physical Therapy

## 2019-10-28 ENCOUNTER — Other Ambulatory Visit: Payer: Self-pay

## 2019-10-28 DIAGNOSIS — R278 Other lack of coordination: Secondary | ICD-10-CM

## 2019-10-28 DIAGNOSIS — M62838 Other muscle spasm: Secondary | ICD-10-CM

## 2019-10-28 NOTE — Therapy (Signed)
[facility] [address], [place], [place], [address] Phone: [phone]   Fax:  [phone]  Physical Therapy Treatment  Patient Details  Name: Alisha Espinoza MRN: [id number] Date of Birth: 05/23/1965 Referring Provider (PT): Mauri Pole, MD   Encounter Date: 10/28/2019   PT End of Session - 10/28/19 1144    Visit Number 6    Date for PT Re-Evaluation 11/05/19    Authorization Type out of network    PT Start Time 1103    PT Stop Time 1145    PT Time Calculation (min) 42 min    Activity Tolerance Patient limited by pain;Patient tolerated treatment well    Behavior During Therapy [facility] for tasks assessed/performed           Past Medical History:  Diagnosis Date  . Bronchitis   . Diabetes mellitus ([place]) 01/19/2019  . Dyspnea   . Heart murmur    resolved "closed by age 89."   . Hyperlipidemia   . Hypertension   . Seasonal allergies     Past Surgical History:  Procedure Laterality Date  . ABDOMINAL HYSTERECTOMY  2000  . ANTERIOR CERVICAL DECOMP/DISCECTOMY FUSION  03/19/2012   Procedure: ANTERIOR CERVICAL DECOMPRESSION/DISCECTOMY FUSION 1 LEVEL;  Surgeon: Melina Schools, MD;  Location: [place];  Service: Orthopedics;  Laterality: Left;  Total Disc Replacement C4-5  . ANTERIOR CERVICAL DECOMP/DISCECTOMY FUSION N/A 05/07/2012   Procedure: ANTERIOR CERVICAL DECOMPRESSION/DISCECTOMY FUSION 1 LEVEL/HARDWARE REMOVAL;  Surgeon: Melina Schools, MD;  Location: [place];  Service: Orthopedics;  Laterality: N/A;  REMOVAL OF CERVICAL DISC REPLACEMENT AND ACDF C4-5  . BACK SURGERY    . CARPAL TUNNEL RELEASE Right 09/28/2018   Procedure: CARPAL TUNNEL RELEASE;  Surgeon: Eustace Moore, MD;  Location: [place];  Service: Neurosurgery;  Laterality: Right;  CARPAL TUNNEL RELEASE  . CERVICAL FUSION  05/07/2012   Dr Rolena Infante  . COLONOSCOPY    . CYSTECTOMY     L wrist  . POLYPECTOMY    . ROTATOR CUFF REPAIR     Right  . TRIGGER  FINGER RELEASE Left 05/03/2019   Procedure: LEFT LONG AND RING FINGER RELEASE TRIGGER FINGER/A-1 PULLEY;  Surgeon: Leanora Cover, MD;  Location: [place];  Service: Orthopedics;  Laterality: Left;  beir block  . TRIGGER FINGER RELEASE Right 09/23/2019   Procedure: RELEASE TRIGGER FINGER/A-1 PULLEY RIGHT THUMB, LONG AND RING FINGERS;  Surgeon: Leanora Cover, MD;  Location: [place];  Service: Orthopedics;  Laterality: Right;  . TYMPANOSTOMY TUBE PLACEMENT      There were no vitals filed for this visit.   Subjective Assessment - 10/28/19 1233    Subjective Pt states she is 100% improved with discomfort and difficulty with BM.  Pt is having pain in Lt calf and thigh today when moving and standing on it. Denies pain at rest    Patient Stated Goals be able to have more solid stool and be able to pass it without pain    Currently in Pain? No/denies                             [facility] Adult PT Treatment/Exercise - 10/28/19 0001      Lumbar Exercises: Stretches   Gastroc Stretch Right;Left;1 rep;30 seconds    Gastroc Stretch Limitations small bend and straighten LE for nerve glide on Lt       Lumbar Exercises: Aerobic  Nustep L3 x 5 min      Lumbar Exercises: Machines for Strengthening   Leg Press 50# bilat - 8 reps then stopping due to pain in thigh      Lumbar Exercises: Standing   Functional Squats 10 reps    Functional Squats Limitations bilat UE support on railing      Lumbar Exercises: Supine   Bent Knee Raise 10 reps      Lumbar Exercises: Sidelying   Clam Right;Left;15 reps      Manual Therapy   Soft tissue mobilization Lt calf                    PT Short Term Goals - 10/28/19 1107      PT SHORT TERM GOAL #1   Title She will be indpendent with initial HEP    Status Achieved      PT SHORT TERM GOAL #2   Title She will report at least 25% less pain with a BM    Baseline no pain    Status Achieved      PT SHORT  TERM GOAL #3   Title Pt will report BM is more normal consistency of 3-5 on bristol stool chart    Status Achieved             PT Long Term Goals - 10/28/19 1108      PT LONG TERM GOAL #1   Title She will be indpendent with all HEP issued    Status On-going      PT LONG TERM GOAL #2   Title She will report pain with BM decreased 50% or more    Baseline 100% better; no pain    Status Achieved      PT LONG TERM GOAL #3   Title Pt will be able to perform breathing techniques and bulge pelvic floor so she can improved muscle elasticity for better bowel movements    Baseline 100% improved    Status Achieved      PT LONG TERM GOAL #4   Title Pt will be able to perform transfers and lifting techniques correctly without using valsalva maneuver    Baseline demonstrates squat with good technique    Status Achieved      PT LONG TERM GOAL #5   Title Increase bilat. hip extension strength to 5/5 MMT to improve ability for lifting for chores    Status Achieved                 Plan - 10/28/19 1241    Clinical Impression Statement Pt did well with exercises today but has pain with nerve glides.  Overall, she has met long term goals of pain with BM and has increased hip ext MMT 5/5 without pian.  Pt worked on core strength today.  She did well with mini squats needing bilat UE support.  She had some pain with hip flexion but no increased pain at the end of the session today.    Comorbidities 2 lumbar surgeries, 2 cervical surgeries; Lt diverticulosis; polyps; rectal pain and bleeding internal and external hemorrhoids hx of hysterectomy; cerivcal fusion and back surgery    PT Treatment/Interventions Electrical Stimulation;Iontophoresis 47m/ml Dexamethasone;Moist Heat;Therapeutic exercise;Therapeutic activities;Patient/family education;Passive range of motion;Dry needling;Manual techniques;Taping;ADLs/Self Care Home Management;Biofeedback;Cryotherapy;Neuromuscular re-education            Patient will benefit from skilled therapeutic intervention in order to improve the following deficits and impairments:  Pain, Increased muscle spasms, Decreased strength,  Decreased activity tolerance, Difficulty walking, Impaired tone, Decreased coordination  Visit Diagnosis: Other muscle spasm  Other lack of coordination     Problem List Patient Active Problem List   Diagnosis Date Noted  . 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 Hemorrhoids 01/21/2019  . Essential hypertension 01/21/2019  . Serum calcium elevated 01/21/2019  . Elevated cholesterol 01/19/2019  . Diabetes mellitus ([place]) 01/19/2019  . Trigger ring finger of left hand 01/11/2019  . Trigger finger of right thumb 01/11/2019  . 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 Acute bronchitis 08/08/2015  . Leukocytosis 08/08/2015  . Hyperglycemia 08/08/2015  . Tobacco abuse 08/08/2015  . 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 BACTERIAL PNEUMONIA, RIGHT LOWER LOBE 04/27/2008  . LUMP OR MASS IN BREAST 04/27/2008  . COUGH 04/27/2008    Jule Ser, PT 10/28/2019, 12:59 PM  [facility] [address] [place], [place], [address] Phone: [phone]   Fax:  [phone]  Name: Alisha Espinoza MRN: [id number] Date of Birth: 1966-03-05

## 2019-10-28 NOTE — Telephone Encounter (Signed)
I have called her and scheduled her for epidural with Dr. Letta Pate and increased her Gabapentin dose, thank you!

## 2019-10-29 ENCOUNTER — Telehealth: Payer: Self-pay | Admitting: Family Medicine

## 2019-10-29 NOTE — Telephone Encounter (Signed)
I contacted patient and asked if she was on blood thinners.  She stated 'no'.  I informed patient that a driver is required to take her home following procedure

## 2019-10-29 NOTE — Telephone Encounter (Signed)
Patient called checking on STD Forms. I advised [employer] has logged they mailed her packet 6/21. She stated she mailed back to them what they needed. I gave her their number to followup with.

## 2019-11-01 ENCOUNTER — Encounter: Payer: Self-pay | Admitting: Family Medicine

## 2019-11-01 ENCOUNTER — Ambulatory Visit (INDEPENDENT_AMBULATORY_CARE_PROVIDER_SITE_OTHER): Payer: Medicaid Other | Admitting: Family Medicine

## 2019-11-01 ENCOUNTER — Other Ambulatory Visit: Payer: Self-pay

## 2019-11-01 ENCOUNTER — Telehealth: Payer: Self-pay

## 2019-11-01 DIAGNOSIS — G8929 Other chronic pain: Secondary | ICD-10-CM

## 2019-11-01 DIAGNOSIS — M5440 Lumbago with sciatica, unspecified side: Secondary | ICD-10-CM | POA: Diagnosis not present

## 2019-11-01 NOTE — Telephone Encounter (Signed)
Dr. Junius Roads has now put in the order for PT, for this location.

## 2019-11-01 NOTE — Telephone Encounter (Signed)
Patient called in wanting to speak about getting PT set up

## 2019-11-01 NOTE — Progress Notes (Signed)
Office Visit Note   Patient: Alisha Espinoza           Date of Birth: 11-22-1965           MRN: [id number] Visit Date: 11/01/2019 Requested by: Drue Flirt, MD [phone],  [place] [address] PCP: Drue Flirt, MD  Subjective: Chief Complaint  Patient presents with  . Lower Back - Pain    Continues to have pain in the lower back and down the left leg. Has been going to PT for some other problem but has had one visit where the therapist worked on her leg.    HPI: She is here with low back pain.  Ongoing pain in the low back with occasional pain down the left leg.  She has been to physical therapy for her low back, but had to switch to a different body part for another injury.  She has not been doing PT consistently for her back pain.              ROS:   All other systems were reviewed and are negative.  Objective: Vital Signs: There were no vitals taken for this visit.  Physical Exam:  General:  Alert and oriented, in no acute distress. Pulm:  Breathing unlabored. Psy:  Normal mood, congruent affect  Back:  Tender near the left sciatic notch.  Negative SLR.  Normal strength and reflexes.  Tender with tinel's at tibial tunnel.  Imaging: No results found.  Assessment & Plan: 1.  Chronic LBP with left-sided sciatica. - Will try PT more consistently. - Awaiting MRI approval. - ESI if fails to improve.      Procedures: No procedures performed  No notes on file     PMFS History: Patient Active Problem List   Diagnosis Date Noted  . 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 Hemorrhoids 01/21/2019  . Essential hypertension 01/21/2019  . Serum calcium elevated 01/21/2019  . Elevated cholesterol 01/19/2019  . Diabetes mellitus ([place]) 01/19/2019  . Trigger ring finger of left hand 01/11/2019  . Trigger finger of right thumb 01/11/2019  . 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 Acute bronchitis 08/08/2015  . Leukocytosis 08/08/2015  . Hyperglycemia 08/08/2015  .  Tobacco abuse 08/08/2015  . 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 BACTERIAL PNEUMONIA, RIGHT LOWER LOBE 04/27/2008  . LUMP OR MASS IN BREAST 04/27/2008  . COUGH 04/27/2008   Past Medical History:  Diagnosis Date  . Bronchitis   . Diabetes mellitus ([place]) 01/19/2019  . Dyspnea   . Heart murmur    resolved "closed by age 35."   . Hyperlipidemia   . Hypertension   . Seasonal allergies     Family History  Problem Relation Age of Onset  . Diabetes Mother   . Hypertension Father   . Pancreatic cancer Sister   . Colon cancer Neg Hx   . Rectal cancer Neg Hx   . Stomach cancer Neg Hx   . Colon polyps Neg Hx   . Esophageal cancer Neg Hx     Past Surgical History:  Procedure Laterality Date  . ABDOMINAL HYSTERECTOMY  2000  . ANTERIOR CERVICAL DECOMP/DISCECTOMY FUSION  03/19/2012   Procedure: ANTERIOR CERVICAL DECOMPRESSION/DISCECTOMY FUSION 1 LEVEL;  Surgeon: Melina Schools, MD;  Location: [place];  Service: Orthopedics;  Laterality: Left;  Total Disc Replacement C4-5  . ANTERIOR CERVICAL DECOMP/DISCECTOMY FUSION N/A 05/07/2012   Procedure: ANTERIOR CERVICAL DECOMPRESSION/DISCECTOMY FUSION 1 LEVEL/HARDWARE REMOVAL;  Surgeon: Melina Schools, MD;  Location: [place];  Service: Orthopedics;  Laterality: N/A;  REMOVAL OF CERVICAL DISC REPLACEMENT AND ACDF C4-5  . BACK SURGERY    . CARPAL TUNNEL RELEASE Right 09/28/2018   Procedure: CARPAL TUNNEL RELEASE;  Surgeon: Eustace Moore, MD;  Location: [place];  Service: Neurosurgery;  Laterality: Right;  CARPAL TUNNEL RELEASE  . CERVICAL FUSION  05/07/2012   Dr Rolena Infante  . COLONOSCOPY    . CYSTECTOMY     L wrist  . POLYPECTOMY    . ROTATOR CUFF REPAIR     Right  . TRIGGER FINGER RELEASE Left 05/03/2019   Procedure: LEFT LONG AND RING FINGER RELEASE TRIGGER FINGER/A-1 PULLEY;  Surgeon: Leanora Cover, MD;  Location: [place];  Service: Orthopedics;  Laterality: Left;  beir block  . TRIGGER FINGER RELEASE Right 09/23/2019    Procedure: RELEASE TRIGGER FINGER/A-1 PULLEY RIGHT THUMB, LONG AND RING FINGERS;  Surgeon: Leanora Cover, MD;  Location: [place];  Service: Orthopedics;  Laterality: Right;  . TYMPANOSTOMY TUBE PLACEMENT     Social History   Occupational History  . Not on file  Tobacco Use  . Smoking status: Current Every Day Smoker    Packs/day: 0.50    Years: 30.00    Pack years: 15.00    Types: Cigarettes  . Smokeless tobacco: Never Used  . Tobacco comment: 10cigs/day  Vaping Use  . Vaping Use: Never used  Substance and Sexual Activity  . Alcohol use: Yes    Comment: occasionally  . Drug use: No  . Sexual activity: Yes    Birth control/protection: Surgical    Comment: Hysterectomy

## 2019-11-04 ENCOUNTER — Other Ambulatory Visit: Payer: Self-pay

## 2019-11-04 ENCOUNTER — Ambulatory Visit: Payer: Medicaid Other | Attending: Gastroenterology | Admitting: Physical Therapy

## 2019-11-04 DIAGNOSIS — R278 Other lack of coordination: Secondary | ICD-10-CM | POA: Diagnosis present

## 2019-11-04 DIAGNOSIS — M5442 Lumbago with sciatica, left side: Secondary | ICD-10-CM | POA: Diagnosis present

## 2019-11-04 DIAGNOSIS — G8929 Other chronic pain: Secondary | ICD-10-CM | POA: Diagnosis present

## 2019-11-04 DIAGNOSIS — M62830 Muscle spasm of back: Secondary | ICD-10-CM | POA: Diagnosis present

## 2019-11-04 DIAGNOSIS — R293 Abnormal posture: Secondary | ICD-10-CM | POA: Insufficient documentation

## 2019-11-04 DIAGNOSIS — M62838 Other muscle spasm: Secondary | ICD-10-CM | POA: Diagnosis not present

## 2019-11-04 NOTE — Patient Instructions (Signed)
Access Code: [email or web address] URL: [email or web address] Date: 11/04/2019 Prepared by: Jari Favre  Exercises Seated Figure 4 Piriformis Stretch - 1 x daily - 7 x weekly - 3 reps - 1 sets - 30 hold Doorway Piriformis Stretch - 1 x daily - 7 x weekly - 1 sets - 3 reps - 30 hold Supine Hip Adductor Stretch - 1 x daily - 7 x weekly - 1 sets - 3 reps - 30 hold Hooklying Single Knee to Chest Stretch - 1 x daily - 7 x weekly - 5 reps - 1 sets - 20 sec hold Standing with Back Flat Against Wall - 1 x daily - 7 x weekly - 10 reps - 1 sets - 5 sec hold Supine March - 1 x daily - 7 x weekly - 3 sets - 10 reps Clamshell - 1 x daily - 7 x weekly - 3 sets - 10 reps Standing Lumbar Extension at Wall - Forearms - 1 x daily - 7 x weekly - 3 sets - 10 reps Swiss Ball March - 1 x daily - 7 x weekly - 3 sets - 10 reps Seated March with Opposite Arm Flexion on Swiss Ball - 1 x daily - 7 x weekly - 3 sets - 10 reps Seated Swiss Ball Pelvic Circles - 1 x daily - 7 x weekly - 3 sets - 10 reps  Patient Education Trigger Point Dry Needling

## 2019-11-04 NOTE — Therapy (Addendum)
[facility] [address], [place], [place], [address] Phone: [phone]   Fax:  [phone]  Physical Therapy Treatment  Patient Details  Name: Alisha Espinoza MRN: [id number] Date of Birth: 14-Sep-1965 Referring Provider (PT): Mauri Pole, MD   Encounter Date: 11/04/2019   PT End of Session - 11/04/19 1159    Visit Number 7    Date for PT Re-Evaluation 11/05/19    Authorization Type out of network    PT Start Time 1157   arrived late   PT Stop Time 1227    PT Time Calculation (min) 30 min    Activity Tolerance Patient limited by pain;Patient tolerated treatment well    Behavior During Therapy [facility] for tasks assessed/performed           Past Medical History:  Diagnosis Date  . Bronchitis   . Diabetes mellitus ([place]) 01/19/2019  . Dyspnea   . Heart murmur    resolved "closed by age 89."   . Hyperlipidemia   . Hypertension   . Seasonal allergies     Past Surgical History:  Procedure Laterality Date  . ABDOMINAL HYSTERECTOMY  2000  . ANTERIOR CERVICAL DECOMP/DISCECTOMY FUSION  03/19/2012   Procedure: ANTERIOR CERVICAL DECOMPRESSION/DISCECTOMY FUSION 1 LEVEL;  Surgeon: Melina Schools, MD;  Location: [place];  Service: Orthopedics;  Laterality: Left;  Total Disc Replacement C4-5  . ANTERIOR CERVICAL DECOMP/DISCECTOMY FUSION N/A 05/07/2012   Procedure: ANTERIOR CERVICAL DECOMPRESSION/DISCECTOMY FUSION 1 LEVEL/HARDWARE REMOVAL;  Surgeon: Melina Schools, MD;  Location: [place];  Service: Orthopedics;  Laterality: N/A;  REMOVAL OF CERVICAL DISC REPLACEMENT AND ACDF C4-5  . BACK SURGERY    . CARPAL TUNNEL RELEASE Right 09/28/2018   Procedure: CARPAL TUNNEL RELEASE;  Surgeon: Eustace Moore, MD;  Location: [place];  Service: Neurosurgery;  Laterality: Right;  CARPAL TUNNEL RELEASE  . CERVICAL FUSION  05/07/2012   Dr Rolena Infante  . COLONOSCOPY    . CYSTECTOMY     L wrist  . POLYPECTOMY    . ROTATOR CUFF REPAIR     Right    . TRIGGER FINGER RELEASE Left 05/03/2019   Procedure: LEFT LONG AND RING FINGER RELEASE TRIGGER FINGER/A-1 PULLEY;  Surgeon: Leanora Cover, MD;  Location: [place];  Service: Orthopedics;  Laterality: Left;  beir block  . TRIGGER FINGER RELEASE Right 09/23/2019   Procedure: RELEASE TRIGGER FINGER/A-1 PULLEY RIGHT THUMB, LONG AND RING FINGERS;  Surgeon: Leanora Cover, MD;  Location: [place];  Service: Orthopedics;  Laterality: Right;  . TYMPANOSTOMY TUBE PLACEMENT      There were no vitals filed for this visit.   Subjective Assessment - 11/04/19 1200    Subjective Pt reports a little pain today but not much.  Pt states the pain is better    Currently in Pain? No/denies                             [facility] Adult PT Treatment/Exercise - 11/04/19 0001      Self-Care   Other Self-Care Comments  tactile cues for bulging in toileting review of position ; rolling to calf fascial release wand      Lumbar Exercises: Aerobic   Nustep L3 x 8 min PT present for status update      Lumbar Exercises: Seated   Long Arc Quad on Justice Strengthening;Both;15 reps    Hip Flexion on [employer]  Other Seated Lumbar Exercises bouncing and circles on ball for pelvic relaxation 1 min each                    PT Short Term Goals - 10/28/19 1107      PT SHORT TERM GOAL #1   Title She will be indpendent with initial HEP    Status Achieved      PT SHORT TERM GOAL #2   Title She will report at least 25% less pain with a BM    Baseline no pain    Status Achieved      PT SHORT TERM GOAL #3   Title Pt will report BM is more normal consistency of 3-5 on bristol stool chart    Status Achieved             PT Long Term Goals - 11/04/19 1412      PT LONG TERM GOAL #1   Title She will be indpendent with all HEP issued    Status Achieved      PT LONG TERM GOAL #2   Title She will report pain with BM decreased 50% or more     Status Achieved      PT LONG TERM GOAL #3   Title Pt will be able to perform breathing techniques and bulge pelvic floor so she can improved muscle elasticity for better bowel movements    Status Achieved      PT LONG TERM GOAL #4   Title Pt will be able to perform transfers and lifting techniques correctly without using valsalva maneuver    Status Achieved      PT LONG TERM GOAL #5   Title Increase bilat. hip extension strength to 5/5 MMT to improve ability for lifting for chores    Status Achieved                 Plan - 11/04/19 1227    Clinical Impression Statement Pt did well with exercises today.  She is ind with HEP and toileting techniques.  She demonstrates good bulging of pelvic floor and she is not using excess force or bearing down.  Pt has met all goals and will discharge today.    PT Treatment/Interventions Electrical Stimulation;Iontophoresis 62m/ml Dexamethasone;Moist Heat;Therapeutic exercise;Therapeutic activities;Patient/family education;Passive range of motion;Dry needling;Manual techniques;Taping;ADLs/Self Care Home Management;Biofeedback;Cryotherapy;Neuromuscular re-education    PT Next Visit Plan progress core strength; gluteal and lumbar stretches; f/u on self massage and toileting as needed    PT Home Exercise Plan [email or web address]    Consulted and Agree with Plan of Care Patient           Patient will benefit from skilled therapeutic intervention in order to improve the following deficits and impairments:  Pain, Increased muscle spasms, Decreased strength, Decreased activity tolerance, Difficulty walking, Impaired tone, Decreased coordination  Visit Diagnosis: Other muscle spasm  Other lack of coordination     Problem List Patient Active Problem List   Diagnosis Date Noted  . 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 Hemorrhoids 01/21/2019  . Essential hypertension 01/21/2019  . Serum calcium elevated 01/21/2019  . Elevated cholesterol 01/19/2019  . Diabetes  mellitus ([place] 01/19/2019  . Trigger ring finger of left hand 01/11/2019  . Trigger finger of right thumb 01/11/2019  . 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 Acute bronchitis 08/08/2015  . Leukocytosis 08/08/2015  . Hyperglycemia 08/08/2015  . Tobacco abuse 08/08/2015  . 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 BACTERIAL PNEUMONIA, RIGHT LOWER LOBE  04/27/2008  . LUMP OR MASS IN BREAST 04/27/2008  . COUGH 04/27/2008    Jule Ser, PT 11/04/2019, 2:16 PM  [facility] [address] [place], [place], [address] Phone: [phone]   Fax:  [phone]  Name: Alisha Espinoza MRN: [id number] Date of Birth: Jun 09, 1965  PHYSICAL THERAPY DISCHARGE SUMMARY  Visits from Start of Care: 7  Current functional level related to goals / functional outcomes: See above goals   Remaining deficits: See above   Education / Equipment: HEP Plan: Patient agrees to discharge.  Patient goals were met. Patient is being discharged due to meeting the stated rehab goals.  ?????    [employer], PT 11/08/19 8:55 AM

## 2019-11-05 ENCOUNTER — Ambulatory Visit: Payer: Medicaid Other | Admitting: Physical Medicine and Rehabilitation

## 2019-11-11 ENCOUNTER — Encounter: Payer: Medicaid Other | Attending: Physical Medicine and Rehabilitation | Admitting: Physical Medicine & Rehabilitation

## 2019-11-11 ENCOUNTER — Encounter: Payer: Self-pay | Admitting: Physical Medicine & Rehabilitation

## 2019-11-11 ENCOUNTER — Other Ambulatory Visit: Payer: Self-pay

## 2019-11-11 VITALS — BP 114/83 | HR 87 | Temp 98.2°F | Ht 66.0 in | Wt 193.0 lb

## 2019-11-11 DIAGNOSIS — G894 Chronic pain syndrome: Secondary | ICD-10-CM | POA: Insufficient documentation

## 2019-11-11 DIAGNOSIS — M5417 Radiculopathy, lumbosacral region: Secondary | ICD-10-CM | POA: Insufficient documentation

## 2019-11-11 DIAGNOSIS — Z5181 Encounter for therapeutic drug level monitoring: Secondary | ICD-10-CM | POA: Insufficient documentation

## 2019-11-11 DIAGNOSIS — G4701 Insomnia due to medical condition: Secondary | ICD-10-CM | POA: Insufficient documentation

## 2019-11-11 DIAGNOSIS — Z79891 Long term (current) use of opiate analgesic: Secondary | ICD-10-CM | POA: Diagnosis present

## 2019-11-11 NOTE — Progress Notes (Signed)
  PROCEDURE RECORD [facility]   Name: Alisha Espinoza DOB:25-Nov-1965 MRN: [id number]  Date:11/11/2019  Physician: Alysia Penna, MD    Nurse/CMA: Truman Hayward, CMA  Allergies:  Allergies  Allergen Reactions  . Meloxicam Hives  . Mobic [Meloxicam]     Other reaction(s): Hives    Consent Signed: Yes.    Is patient diabetic? No.  CBG today? 106  Pregnant: No. LMP: No LMP recorded. Patient has had a hysterectomy. (age 54-55)  Anticoagulants: no Anti-inflammatory: yes (Ibuprofen) Antibiotics: no  Procedure:Bilateral L4-5, L5-S1 Epidural Steroid Injection  Position: Prone Start Time:2:27 pm  End Time: 2:36 pm Fluoro Time:35  RN/CMA Jerline Pain, RMA Darrel Gloss, CMA    Time 1:50 pm 2:44pm    BP 114/83 140/89    Pulse 87 80    Respirations 16 16    O2 Sat 96 97    S/S 6 6    Pain Level 6/10 0/10     D/C home with friend, patient A & O X 3, D/C instructions reviewed, and sits independently.

## 2019-11-11 NOTE — Progress Notes (Signed)
Left L4-5. L5-S1 Lumbar transforaminal epidural steroid injection under fluoroscopic guidance with contrast enhancement  Indication: Lumbosacral radiculitis is not relieved by medication management or other conservative care and interfering with self-care and mobility.   Informed consent was obtained after describing risk and benefits of the procedure with the patient, this includes bleeding, bruising, infection, paralysis and medication side effects.  The patient wishes to proceed and has given written consent.  Patient was placed in prone position.  The lumbar area was marked and prepped with Betadine.  It was entered with a 25-gauge 1-1/2 inch needle and one mL of 1% lidocaine was injected into the skin and subcutaneous tissue.  Then a 22-gauge 5in spinal needle was inserted into the Left L4-5 and L5-S1 intervertebral foramen under AP, lateral, and oblique view.  Once needle tip was within the foramen on lateral views an dnor exceeding 6 o clock position on th epedical on AP viewed Isovue 200 was inected x 82ml Then a solution containing one mL of 10 mg per mL dexamethasone and 2 mL of 1% lidocaine was injected.  The patient tolerated procedure well.  Post procedure instructions were given.  Please see post procedure form.

## 2019-11-11 NOTE — Patient Instructions (Signed)
You received an epidural steroid injection under fluoroscopic guidance. This is the most accurate way to perform an epidural injection. This injection was performed to relieve thigh or leg or foot pain that may be related to a pinched nerve in the lumbar spine. The local anesthetic injected today may cause numbness in your leg for a couple hours. If it is severe we may need to observe you for 30-60 minutes after the injection. The cortisone medicine injected today may take several days to take full effect. This medicine can also cause facial flushing or feeling of being warm.  This injection may last for days weeks or months. It can be repeated if needed. If it is not effective, another spinal level may need to be injected. Other treatments include medication management as well as physical therapy. In some cases surgery may be an option.  If your pain switches to Right side you may benefit from the same injections performed on the right side

## 2019-11-16 ENCOUNTER — Encounter: Payer: Self-pay | Admitting: Physical Medicine and Rehabilitation

## 2019-11-16 ENCOUNTER — Other Ambulatory Visit: Payer: Self-pay

## 2019-11-16 ENCOUNTER — Encounter: Payer: Medicaid Other | Admitting: Physical Medicine and Rehabilitation

## 2019-11-16 VITALS — BP 126/86 | HR 86 | Temp 98.2°F | Ht 66.0 in | Wt 199.2 lb

## 2019-11-16 DIAGNOSIS — L905 Scar conditions and fibrosis of skin: Secondary | ICD-10-CM | POA: Diagnosis not present

## 2019-11-16 DIAGNOSIS — Z6832 Body mass index (BMI) 32.0-32.9, adult: Secondary | ICD-10-CM | POA: Diagnosis not present

## 2019-11-16 DIAGNOSIS — M5417 Radiculopathy, lumbosacral region: Secondary | ICD-10-CM | POA: Diagnosis not present

## 2019-11-16 DIAGNOSIS — E669 Obesity, unspecified: Secondary | ICD-10-CM

## 2019-11-16 MED ORDER — TRAMADOL HCL 50 MG PO TABS: 50.0000 mg | ORAL_TABLET | Freq: Three times a day (TID) | ORAL | 0 refills | Status: DC | PRN

## 2019-11-16 NOTE — Progress Notes (Signed)
Subjective:    Patient ID: Alisha Espinoza, female    DOB: 10/17/1965, 54 y.o.   MRN: [id number]  HPI  Alisha Espinoza is a 54 year old woman who presents for follow-up of left sided lumbosacral radiculopathy.  She had a left L4-L5 and L5-S1 lumbar transforaminal epidural steroid injection with Dr. Letta Pate on 8/12 and had one day of complete relief from the procedure. Discussed that this was likely from the lidocaine and is diagnostic that this radiculitis is the contributor to her pain.  Since then her pain has returned and has been severe. It radiated down the posterior aspect of her left leg down to her foot. She has been taking the Tramadol 50mg  twice per day and once in while she takes a third if she really needs.  She also has some pain at the incision site on her right palm where she said a suture was retained after her recent trigger finger surgery. This area can be painful to her and has hypertrophic scarring but the other sites have healed well.   Tries to push herself to walk for exercise despite the pain. She will restart physical therapy for her radiculopathy next week. She drinks tea with turermic, garlic, honey once per month.   Pain Inventory Average Pain 6 Pain Right Now 6 My pain is intermittent, sharp, stabbing and aching  In the last 24 hours, has pain interfered with the following? General activity 7 Relation with others 8 Enjoyment of life 9 What TIME of day is your pain at its worst? morning , daytime, evening and night Sleep (in general) Poor  Pain is worse with: walking, sitting and standing Pain improves with: rest, pacing activities and heat Relief from Meds: 3  Family History  Problem Relation Age of Onset  . Diabetes Mother   . Hypertension Father   . Pancreatic cancer Sister   . Colon cancer Neg Hx   . Rectal cancer Neg Hx   . Stomach cancer Neg Hx   . Colon polyps Neg Hx   . Esophageal cancer Neg Hx    Social History   Socioeconomic History    . Marital status: Divorced    Spouse name: Not on file  . Number of children: Not on file  . Years of education: Not on file  . Highest education level: Not on file  Occupational History  . Not on file  Tobacco Use  . Smoking status: Current Every Day Smoker    Packs/day: 0.50    Years: 30.00    Pack years: 15.00    Types: Cigarettes  . Smokeless tobacco: Never Used  . Tobacco comment: 10cigs/day  Vaping Use  . Vaping Use: Never used  Substance and Sexual Activity  . Alcohol use: Yes    Comment: occasionally  . Drug use: No  . Sexual activity: Yes    Birth control/protection: Surgical    Comment: Hysterectomy  Other Topics Concern  . Not on file  Social History Narrative  . Not on file   Social Determinants of Health   Financial Resource Strain:   . Difficulty of Paying Living Expenses:   Food Insecurity:   . Worried About Charity fundraiser in the Last Year:   . Arboriculturist in the Last Year:   Transportation Needs:   . Film/video editor (Medical):   Marland Kitchen Lack of Transportation (Non-Medical):   Physical Activity:   . Days of Exercise per Week:   . Minutes  of Exercise per Session:   Stress:   . Feeling of Stress :   Social Connections:   . Frequency of Communication with Friends and Family:   . Frequency of Social Gatherings with Friends and Family:   . Attends Religious Services:   . Active Member of Clubs or Organizations:   . Attends Archivist Meetings:   Marland Kitchen Marital Status:    Past Surgical History:  Procedure Laterality Date  . ABDOMINAL HYSTERECTOMY  2000  . ANTERIOR CERVICAL DECOMP/DISCECTOMY FUSION  03/19/2012   Procedure: ANTERIOR CERVICAL DECOMPRESSION/DISCECTOMY FUSION 1 LEVEL;  Surgeon: Melina Schools, MD;  Location: [place];  Service: Orthopedics;  Laterality: Left;  Total Disc Replacement C4-5  . ANTERIOR CERVICAL DECOMP/DISCECTOMY FUSION N/A 05/07/2012   Procedure: ANTERIOR CERVICAL DECOMPRESSION/DISCECTOMY FUSION 1 LEVEL/HARDWARE  REMOVAL;  Surgeon: Melina Schools, MD;  Location: [place];  Service: Orthopedics;  Laterality: N/A;  REMOVAL OF CERVICAL DISC REPLACEMENT AND ACDF C4-5  . BACK SURGERY    . CARPAL TUNNEL RELEASE Right 09/28/2018   Procedure: CARPAL TUNNEL RELEASE;  Surgeon: Eustace Moore, MD;  Location: [place];  Service: Neurosurgery;  Laterality: Right;  CARPAL TUNNEL RELEASE  . CERVICAL FUSION  05/07/2012   Dr Rolena Infante  . COLONOSCOPY    . CYSTECTOMY     L wrist  . POLYPECTOMY    . ROTATOR CUFF REPAIR     Right  . TRIGGER FINGER RELEASE Left 05/03/2019   Procedure: LEFT LONG AND RING FINGER RELEASE TRIGGER FINGER/A-1 PULLEY;  Surgeon: Leanora Cover, MD;  Location: [place];  Service: Orthopedics;  Laterality: Left;  beir block  . TRIGGER FINGER RELEASE Right 09/23/2019   Procedure: RELEASE TRIGGER FINGER/A-1 PULLEY RIGHT THUMB, LONG AND RING FINGERS;  Surgeon: Leanora Cover, MD;  Location: [place];  Service: Orthopedics;  Laterality: Right;  . TYMPANOSTOMY TUBE PLACEMENT     Past Surgical History:  Procedure Laterality Date  . ABDOMINAL HYSTERECTOMY  2000  . ANTERIOR CERVICAL DECOMP/DISCECTOMY FUSION  03/19/2012   Procedure: ANTERIOR CERVICAL DECOMPRESSION/DISCECTOMY FUSION 1 LEVEL;  Surgeon: Melina Schools, MD;  Location: [place];  Service: Orthopedics;  Laterality: Left;  Total Disc Replacement C4-5  . ANTERIOR CERVICAL DECOMP/DISCECTOMY FUSION N/A 05/07/2012   Procedure: ANTERIOR CERVICAL DECOMPRESSION/DISCECTOMY FUSION 1 LEVEL/HARDWARE REMOVAL;  Surgeon: Melina Schools, MD;  Location: [place];  Service: Orthopedics;  Laterality: N/A;  REMOVAL OF CERVICAL DISC REPLACEMENT AND ACDF C4-5  . BACK SURGERY    . CARPAL TUNNEL RELEASE Right 09/28/2018   Procedure: CARPAL TUNNEL RELEASE;  Surgeon: Eustace Moore, MD;  Location: [place];  Service: Neurosurgery;  Laterality: Right;  CARPAL TUNNEL RELEASE  . CERVICAL FUSION  05/07/2012   Dr Rolena Infante  . COLONOSCOPY    . CYSTECTOMY     L wrist  .  POLYPECTOMY    . ROTATOR CUFF REPAIR     Right  . TRIGGER FINGER RELEASE Left 05/03/2019   Procedure: LEFT LONG AND RING FINGER RELEASE TRIGGER FINGER/A-1 PULLEY;  Surgeon: Leanora Cover, MD;  Location: [place];  Service: Orthopedics;  Laterality: Left;  beir block  . TRIGGER FINGER RELEASE Right 09/23/2019   Procedure: RELEASE TRIGGER FINGER/A-1 PULLEY RIGHT THUMB, LONG AND RING FINGERS;  Surgeon: Leanora Cover, MD;  Location: [place];  Service: Orthopedics;  Laterality: Right;  . TYMPANOSTOMY TUBE PLACEMENT     Past Medical History:  Diagnosis Date  . Bronchitis   . Diabetes mellitus ([place]) 01/19/2019  .  Dyspnea   . Heart murmur    resolved "closed by age 76."   . Hyperlipidemia   . Hypertension   . Seasonal allergies    BP 126/86   Pulse 86   Temp 98.2 F (36.8 C)   Ht 5\' 6"  (1.676 m)   Wt 199 lb 3.2 oz (90.4 kg)   SpO2 96%   BMI 32.15 kg/m   Opioid Risk Score:   Fall Risk Score:  `1  Depression screen PHQ 2/9  Depression screen [facility] 2/9 10/08/2019 08/24/2019 01/25/2019  Decreased Interest 0 2 0  Down, Depressed, Hopeless 0 2 1  PHQ - 2 Score 0 4 1  Altered sleeping - 3 -  Tired, decreased energy - 1 -  Change in appetite - 1 -  Feeling bad or failure about yourself  - 1 -  Trouble concentrating - 1 -  Moving slowly or fidgety/restless - 3 -  Suicidal thoughts - 0 -  PHQ-9 Score - 14 -  Difficult doing work/chores - Somewhat difficult -  Some recent data might be hidden   Review of Systems  Constitutional: Negative.   HENT: Negative.   Eyes: Negative.   Respiratory: Negative.   Cardiovascular: Negative.   Gastrointestinal: Negative.   Endocrine: Negative.   Genitourinary: Negative.   Musculoskeletal: Positive for back pain and gait problem.       Left leg  Skin: Negative.   Allergic/Immunologic: Negative.   Hematological: Negative.   Psychiatric/Behavioral: Negative.   All other systems reviewed and are negative.        Objective:   Physical Exam Gen: no distress, normal appearing HEENT: oral mucosa pink and moist, NCAT Cardio: Reg rate Chest: normal effort, normal rate of breathing Abd: soft, non-distended Ext: no edema Skin: post-surgical scarring in bilateral hands Neuro: Alert and oriented x3.  Musculoskeletal: 5/5 strength throughout except for 4/5 in left lower extremity. Post slump test for pain radiating down the left leg posteriorly down to foot. Antalgic gait.  Psych: pleasant, normal affect    Assessment & Plan:  Mrs. Labine is a 54 year old woman who presents with bilateral lower extremity lumbosacral back pain with radiculopathy. Also contributing to insomnia.   -MRI of lumbar 2019 reviewed with patient as follows: broad based disc protrusion and bilateral facet hypertrophy at L3-L4 which results in severe right and moderate left subarticular narrowing. Moderate foraminal stenosis present bilaterally. Large right lateral disc protrusion at L4-L5 with severe right sided and mild left subarticular narrowing. Moderate right and mild left foraminal narrowing at L4-L5. Leftward disc protrusion at L5-S1 extends into the left neural foramen without significant stenosis. Broad based disc protrusion and mild bilateral facet hypertrophy at L2-L3 with mild subarticular and foraminal narrowing bilaterally.   -She is s/p ESI with Dr. Letta Pate on 8/12 with 1 day of complete relief. Educated that this is diagnostic and we can consider repeat injection in 3 months.   -Commended on her doing her HEP every day! Advised to continue walking as much as possible for pain relief (improves her pain), and general health. She will be restarting PT next week as well.   -Continue Gabapentin 300mg  at night, can increase to 400mg  for better sleep. Does not tolerate during day. Does not need refill.   -Prescribed Motrin 800mg  to be taken up to three times as needed. Stop Celebrex while taking this. Denies pain in the  belly with that.   -Pain contract and UDS signed previously. Tramadol 50mg  TID PRN  90 pills sent today.   Constipation: Takes laxative every morning and tries to eat high fiber. Eats prunes every day. She drinks coffee, eats meat. Will continue to monitor. Minimize carbs. Likely secondary to the hydrocodone she needed after hand surgery. Has improved. Educated regarding foods that can prevent constipation.  Right hand hypertrophic scarring: This is painful. Prescribed Diclofenac 3%, Gabapentin 5%, Menthol 1% compounded at [facility]:  apply 1-2 pumps three to four times per day as needed. Can also use for lower back pain but pain not be as effective there.   Obesity BMI 32.15, weight 199: educated regarding anti-inflammatory diet. She makes an excellent anti-inflammatory tea. Advised to increase frequency from once per month to once per week. Advised regarding low carb diet.   All questions answered. RTC in 1 month.  40 minutes spent in physical examination of patient's lower back and leg pain, hypertrophic scarring in right hand; discussion of pain and treatment; discussion of anti-inflammatory diet, physical therapy, encouraging exercise; discussion of compounding cream and calling into [facility]

## 2019-11-18 ENCOUNTER — Encounter: Payer: Medicaid Other | Admitting: Physical Therapy

## 2019-11-22 ENCOUNTER — Other Ambulatory Visit: Payer: Medicaid Other

## 2019-11-23 ENCOUNTER — Ambulatory Visit: Payer: Medicaid Other

## 2019-11-25 ENCOUNTER — Ambulatory Visit: Payer: Medicaid Other

## 2019-11-25 ENCOUNTER — Other Ambulatory Visit: Payer: Self-pay

## 2019-11-25 ENCOUNTER — Encounter: Payer: Medicaid Other | Admitting: Physical Therapy

## 2019-11-25 DIAGNOSIS — G8929 Other chronic pain: Secondary | ICD-10-CM

## 2019-11-25 DIAGNOSIS — M62830 Muscle spasm of back: Secondary | ICD-10-CM

## 2019-11-25 DIAGNOSIS — R293 Abnormal posture: Secondary | ICD-10-CM

## 2019-11-25 DIAGNOSIS — M62838 Other muscle spasm: Secondary | ICD-10-CM | POA: Diagnosis not present

## 2019-11-25 NOTE — Therapy (Signed)
[place], [place], [address] Phone: [phone]   Fax:  [phone]  Physical Therapy Evaluation  Patient Details  Name: Alisha Espinoza MRN: [id number] Date of Birth: 04-Jul-1965 Referring Provider (PT): Eunice Blase, MD   Encounter Date: 11/25/2019   PT End of Session - 11/25/19 1606    Visit Number 1    Number of Visits 12    Date for PT Re-Evaluation 01/14/20    Authorization Type [facility]    PT Start Time 0400    PT Stop Time 0450    PT Time Calculation (min) 50 min    Activity Tolerance Patient limited by pain;Patient tolerated treatment well    Behavior During Therapy [facility] for tasks assessed/performed           Past Medical History:  Diagnosis Date  . Bronchitis   . Diabetes mellitus ([place]) 01/19/2019  . Dyspnea   . Heart murmur    resolved "closed by age 33."   . Hyperlipidemia   . Hypertension   . Seasonal allergies     Past Surgical History:  Procedure Laterality Date  . ABDOMINAL HYSTERECTOMY  2000  . ANTERIOR CERVICAL DECOMP/DISCECTOMY FUSION  03/19/2012   Procedure: ANTERIOR CERVICAL DECOMPRESSION/DISCECTOMY FUSION 1 LEVEL;  Surgeon: Melina Schools, MD;  Location: [place];  Service: Orthopedics;  Laterality: Left;  Total Disc Replacement C4-5  . ANTERIOR CERVICAL DECOMP/DISCECTOMY FUSION N/A 05/07/2012   Procedure: ANTERIOR CERVICAL DECOMPRESSION/DISCECTOMY FUSION 1 LEVEL/HARDWARE REMOVAL;  Surgeon: Melina Schools, MD;  Location: [place];  Service: Orthopedics;  Laterality: N/A;  REMOVAL OF CERVICAL DISC REPLACEMENT AND ACDF C4-5  . BACK SURGERY    . CARPAL TUNNEL RELEASE Right 09/28/2018   Procedure: CARPAL TUNNEL RELEASE;  Surgeon: Eustace Moore, MD;  Location: [place];  Service: Neurosurgery;  Laterality: Right;  CARPAL TUNNEL RELEASE  . CERVICAL FUSION  05/07/2012   Dr Rolena Infante  . COLONOSCOPY    . CYSTECTOMY     L wrist  . POLYPECTOMY    . ROTATOR CUFF REPAIR     Right  .  TRIGGER FINGER RELEASE Left 05/03/2019   Procedure: LEFT LONG AND RING FINGER RELEASE TRIGGER FINGER/A-1 PULLEY;  Surgeon: Leanora Cover, MD;  Location: [place];  Service: Orthopedics;  Laterality: Left;  beir block  . TRIGGER FINGER RELEASE Right 09/23/2019   Procedure: RELEASE TRIGGER FINGER/A-1 PULLEY RIGHT THUMB, LONG AND RING FINGERS;  Surgeon: Leanora Cover, MD;  Location: [place];  Service: Orthopedics;  Laterality: Right;  . TYMPANOSTOMY TUBE PLACEMENT      There were no vitals filed for this visit.    Subjective Assessment - 11/25/19 1615    Subjective She reports  injection in spine a week ago and  the shot stopped leg from hurting and now back hurts more  Using ice and heat help alot so better in past day or so. She reports  she was doing HEP 2-3x/day but incr pain in back  she stopped    Pertinent History L3-4 L4-5  fusion. 09/28/19       Cervical surgery x 2  one a fusion   left trigger finger surgery    Limitations --   hard to straighten up   How long can you walk comfortably? 3-4 min    Diagnostic tests MRI ordered    Patient Stated Goals feel better in AM and be more flexible decr pain.    Pain Score 4  Pain Location Back    Pain Orientation Left    Pain Descriptors / Indicators Sore    Pain Type Chronic pain    Pain Onset More than a month ago    Pain Frequency Constant    Aggravating Factors  standing and walking.    Pain Relieving Factors medication, stretch, ice/heat              [facility] PT Assessment - 11/25/19 0001      Assessment   Medical Diagnosis chronic back pain    Referring Provider (PT) Eunice Blase, MD    Onset Date/Surgical Date --   2019   Next MD Visit As needed    Prior Therapy yes      Precautions   Precautions None      Restrictions   Weight Bearing Restrictions No      Balance Screen   Has the patient fallen in the past 6 months No      Prior Function   Level of Independence Independent       Cognition   Overall Cognitive Status Within Functional Limits for tasks assessed      Posture/Postural Control   Posture Comments WNL      AROM   Lumbar Flexion 65   calf pain   Lumbar Extension 10    Lumbar - Right Side Bend 10    Lumbar - Left Side Bend 10   iincr LT back pain   Lumbar - Right Rotation 60%    Lumbar - Left Rotation 60%       PROM   Overall PROM Comments Lt hip rotation 25% limited with inernal rotation.      Strength   Right Hip Flexion 4/5    Right Hip Extension 4/5    Right Hip External Rotation  4/5    Right Hip Internal Rotation 5/5    Right Hip ABduction 4/5    Left Hip Flexion 4+/5    Left Hip Extension 4/5    Left Hip External Rotation 5/5    Left Hip Internal Rotation 4+/5    Left Hip ABduction 4/5    Right Knee Extension 5/5    Left Knee Extension 5/5    Right Ankle Dorsiflexion 5/5    Left Ankle Dorsiflexion 5/5                      Objective measurements completed on examination: See above findings.        HEP : prone lye then prone on elbows  X 30 - 60 sec  X 2 2-3x/day       PT Education - 11/25/19 1712    Education Details POC HEP    Person(s) Educated Patient    Methods Explanation;Tactile cues;Verbal cues;Handout    Comprehension Returned demonstration;Verbalized understanding            PT Short Term Goals - 11/25/19 1705      PT SHORT TERM GOAL #1   Title She will be indpendent with initial HEP    Baseline Needs to be reviewed fully to assess some she may need to stop and added extension ext today    Time 3    Period Weeks    Status New      PT SHORT TERM GOAL #2   Title She will report at least 25% less pain  in lower back on LT side    Baseline moderate to sever Lt LBP, calf pain with  flexion of trunk    Time 3    Period Weeks    Status New      PT SHORT TERM GOAL #3   Title She will be able to bend to 60 degrees without calf pain    Baseline calf pain starts at 60 degrees flexion    Time  3    Period Weeks    Status New             PT Long Term Goals - 11/25/19 1707      PT LONG TERM GOAL #1   Title She will be indpendent with all HEP issued    Baseline independent with initial hEP    Time 6    Period Weeks    Status New      PT LONG TERM GOAL #2   Title She will report pain decreased 50% in LT lower back and no calf pain    Baseline pain 25% in back    Time 6    Period Weeks    Status New      PT LONG TERM GOAL #3   Title She will be able o bend as far as able with trunk without calf pain    Baseline pain after 60 degrees.    Time 6    Period Weeks    Status New      PT LONG TERM GOAL #4   Title She will be able to walk for exercise 1/4 to 1/2 mile on track without stopping due to pain    Baseline not able to walk 100 yards without incr pain    Time 6    Period Weeks    Status New                  Plan - 11/25/19 1607    Clinical Impression Statement Ms Milillo returns after PT for pelvic floor. She report LBP LT with less leg pain post her back injections. She continues to have LT lower back pain that is mod to severe and calf pain with flexion  with Lt leg extended . She is limited with walking and standing.Sitting is painful but she has no leg pain.  Extended positions of spine in standing and prone do not elicit Lt calf/leg pain.   Her spinal ROM is limited . Her LE strength is good.  Abdominals are poor to fair. She is tender in lower lumbar spine Lt > RT. She has had pain for over a year and with the calf pain she may be limited with progress. She needs  skilled PT to see if we can decrease her lower back and episodes of LT leg. pain. She has an MRI in hte future so may have more info then. She appears to do her HEP.    Personal Factors and Comorbidities Past/Current Experience;Time since onset of injury/illness/exacerbation;Comorbidity 3+    Comorbidities 2 lumbar surgeries, 2 cervical surgeries; Lt diverticulosis; polyps; rectal pain and  bleeding internal and external hemorrhoids hx of hysterectomy; cerivcal fusion and back surgery    Examination-Activity Limitations Bed Mobility;Carry;Lift;Stand;Locomotion Level;Continence;Toileting    Examination-Participation Restrictions Community Activity;Laundry;Shop;Cleaning    Stability/Clinical Decision Making Evolving/Moderate complexity    Clinical Decision Making Moderate    Rehab Potential Good    PT Frequency --   3   PT Duration --   over 2-3 weeks then 2x/week for 4 weeks   PT Treatment/Interventions Electrical Stimulation;Iontophoresis 4mg /ml Dexamethasone;Moist Heat;Therapeutic exercise;Therapeutic activities;Patient/family education;Passive range of  motion;Dry needling;Manual techniques;Taping;ADLs/Self Care Home Management;Biofeedback;Cryotherapy;Neuromuscular re-education    PT Next Visit Plan progress core strength; gluteal and lumbar stretches; f/u on self massage  as needed    PT Home Exercise Plan [id number]    Consulted and Agree with Plan of Care Patient         Check all possible CPT codes:      []  97110 (Therapeutic Exercise)  []  92507 (SLP Treatment)  []  97112 (Neuro Re-ed)   []  92526 (Swallowing Treatment)   []  97116 (Gait Training)   []  [id number] (Cognitive Training, 1st 15 minutes) []  97140 (Manual Therapy)   []  97130 (Cognitive Training, each add'l 15 minutes)  []  97530 (Therapeutic Activities)  []  Other, List CPT Code ____________    []  16109 (Self Care)       [x]  All codes above (97110 - 97535)  []  97012 (Mechanical Traction)  [x]  97014 (E-stim Unattended)  []  97032 (E-stim manual)  [x]  97033 (Ionto)  [x]  97035 (Ultrasound)  []  97016 (Vaso)  []  97760 (Orthotic Fit) []  [id number] (Prosthetic Training) []  [id number] (Physical Performance Training) []  [id number] (Aquatic Therapy) []  [id number] (Canalith Repositioning) []  [id number] (Contrast Bath) []  [id number] (Paraffin) []  97597 (Wound Care 1st 20 sq cm) []  97598 (Wound Care each add'l 20 sq cm)     Patient will benefit from  skilled therapeutic intervention in order to improve the following deficits and impairments:  Pain, Increased muscle spasms, Decreased strength, Decreased activity tolerance, Difficulty walking, Impaired tone, Decreased coordination  Visit Diagnosis: Chronic bilateral low back pain with left-sided sciatica  Abnormal posture  Muscle spasm of back     Problem List Patient Active Problem List   Diagnosis Date Noted  . 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 Hemorrhoids 01/21/2019  . Essential hypertension 01/21/2019  . Serum calcium elevated 01/21/2019  . Elevated cholesterol 01/19/2019  . Diabetes mellitus ([place]) 01/19/2019  . Trigger ring finger of left hand 01/11/2019  . Trigger finger of right thumb 01/11/2019  . 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 Acute bronchitis 08/08/2015  . Leukocytosis 08/08/2015  . Hyperglycemia 08/08/2015  . Tobacco abuse 08/08/2015  . 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 BACTERIAL PNEUMONIA, RIGHT LOWER LOBE 04/27/2008  . LUMP OR MASS IN BREAST 04/27/2008  . COUGH 04/27/2008    Darrel Hoover  PT 11/25/2019, 5:13 PM  [facility] [address] [place], [place], [address] Phone: [phone]   Fax:  [phone]  Name: Alisha Espinoza MRN: [id number] Date of Birth: 1965/04/24

## 2019-12-09 ENCOUNTER — Ambulatory Visit: Payer: Medicaid Other | Attending: Gastroenterology

## 2019-12-09 ENCOUNTER — Ambulatory Visit
Admission: RE | Admit: 2019-12-09 | Discharge: 2019-12-09 | Disposition: A | Discharge status: Home / Self Care | Payer: Medicaid Other | Source: Ambulatory Visit | Attending: Family Medicine | Admitting: Family Medicine

## 2019-12-09 ENCOUNTER — Telehealth: Payer: Self-pay | Admitting: Family Medicine

## 2019-12-09 ENCOUNTER — Other Ambulatory Visit: Payer: Self-pay

## 2019-12-09 DIAGNOSIS — M62830 Muscle spasm of back: Secondary | ICD-10-CM | POA: Diagnosis present

## 2019-12-09 DIAGNOSIS — M5442 Lumbago with sciatica, left side: Secondary | ICD-10-CM | POA: Diagnosis not present

## 2019-12-09 DIAGNOSIS — N63 Unspecified lump in unspecified breast: Secondary | ICD-10-CM

## 2019-12-09 DIAGNOSIS — G8929 Other chronic pain: Secondary | ICD-10-CM | POA: Diagnosis present

## 2019-12-09 DIAGNOSIS — R293 Abnormal posture: Secondary | ICD-10-CM | POA: Diagnosis present

## 2019-12-09 NOTE — Therapy (Signed)
[place], [place], [address] Phone: [phone]   Fax:  [phone]  Physical Therapy Treatment  Patient Details  Name: Alisha Espinoza MRN: [id number] Date of Birth: [birth year] Referring Provider (PT): Eunice Blase, MD   Encounter Date: 12/09/2019   PT End of Session - 12/09/19 0919    Visit Number 2    Number of Visits 12    Date for PT Re-Evaluation 01/14/20    Authorization Type [employer]    Authorization Time Period 12/28/19    Authorization - Visit Number 1    Authorization - Number of Visits 27    PT Start Time 0917    PT Stop Time 1000    PT Time Calculation (min) 43 min    Activity Tolerance Patient limited by pain;Patient tolerated treatment well    Behavior During Therapy [facility] for tasks assessed/performed           Past Medical History:  Diagnosis Date  . Bronchitis   . Diabetes mellitus ([place]) 01/19/2019  . Dyspnea   . Heart murmur    resolved "closed by age 57."   . Hyperlipidemia   . Hypertension   . Seasonal allergies     Past Surgical History:  Procedure Laterality Date  . ABDOMINAL HYSTERECTOMY  2000  . ANTERIOR CERVICAL DECOMP/DISCECTOMY FUSION  03/19/2012   Procedure: ANTERIOR CERVICAL DECOMPRESSION/DISCECTOMY FUSION 1 LEVEL;  Surgeon: Melina Schools, MD;  Location: [place];  Service: Orthopedics;  Laterality: Left;  Total Disc Replacement C4-5  . ANTERIOR CERVICAL DECOMP/DISCECTOMY FUSION N/A 05/07/2012   Procedure: ANTERIOR CERVICAL DECOMPRESSION/DISCECTOMY FUSION 1 LEVEL/HARDWARE REMOVAL;  Surgeon: Melina Schools, MD;  Location: [place];  Service: Orthopedics;  Laterality: N/A;  REMOVAL OF CERVICAL DISC REPLACEMENT AND ACDF C4-5  . BACK SURGERY    . CARPAL TUNNEL RELEASE Right 09/28/2018   Procedure: CARPAL TUNNEL RELEASE;  Surgeon: Eustace Moore, MD;  Location: [place];  Service: Neurosurgery;  Laterality: Right;  CARPAL TUNNEL RELEASE  . CERVICAL FUSION  05/07/2012   Dr  Rolena Infante  . COLONOSCOPY    . CYSTECTOMY     L wrist  . POLYPECTOMY    . ROTATOR CUFF REPAIR     Right  . TRIGGER FINGER RELEASE Left 05/03/2019   Procedure: LEFT LONG AND RING FINGER RELEASE TRIGGER FINGER/A-1 PULLEY;  Surgeon: Leanora Cover, MD;  Location: [place];  Service: Orthopedics;  Laterality: Left;  beir block  . TRIGGER FINGER RELEASE Right 09/23/2019   Procedure: RELEASE TRIGGER FINGER/A-1 PULLEY RIGHT THUMB, LONG AND RING FINGERS;  Surgeon: Leanora Cover, MD;  Location: [place];  Service: Orthopedics;  Laterality: Right;  . TYMPANOSTOMY TUBE PLACEMENT      There were no vitals filed for this visit.   Subjective Assessment - 12/09/19 0922    Subjective Back hurts a little today.   Still getting a llittle numbness Lt leg .  i was better yeasterday and went out to walk. More sore today    Pertinent History L3-4 L4-5  fusion. 09/28/19       Cervical surgery x 2  one a fusion   left trigger finger surgery    Pain Score 3     Pain Location Back    Pain Orientation Left    Pain Type Chronic pain    Pain Onset More than a month ago    Pain Frequency Constant    Aggravating Factors  stand Majel Homer.  Pain Relieving Factors meds  cold /heat                             [facility] Adult PT Treatment/Exercise - 12/09/19 0001      Lumbar Exercises: Stretches   Single Knee to Chest Stretch Right;Left;2 reps;30 seconds    Pelvic Tilt 5 seconds    Pelvic Tilt Limitations 12 reps cued for lower ab draw with this      Lumbar Exercises: Aerobic   Nustep L3 4 min      Lumbar Exercises: Supine   Bridge Limitations short range glute set x 15      Modalities   Modalities Electrical Stimulation      Moist Heat Therapy   Number Minutes Moist Heat 15 Minutes    Moist Heat Location Lumbar Spine      Electrical Stimulation   Electrical Stimulation Location Lower back    Electrical Stimulation Action IFC    Electrical Stimulation Parameters to  tolerance     Electrical Stimulation Goals Pain                    PT Short Term Goals - 11/25/19 1705      PT SHORT TERM GOAL #1   Title She will be indpendent with initial HEP    Baseline Needs to be reviewed fully to assess some she may need to stop and added extension ext today    Time 3    Period Weeks    Status New      PT SHORT TERM GOAL #2   Title She will report at least 25% less pain  in lower back on LT side    Baseline moderate to sever Lt LBP, calf pain with flexion of trunk    Time 3    Period Weeks    Status New      PT SHORT TERM GOAL #3   Title She will be able to bend to 60 degrees without calf pain    Baseline calf pain starts at 60 degrees flexion    Time 3    Period Weeks    Status New             PT Long Term Goals - 11/25/19 1707      PT LONG TERM GOAL #1   Title She will be indpendent with all HEP issued    Baseline independent with initial hEP    Time 6    Period Weeks    Status New      PT LONG TERM GOAL #2   Title She will report pain decreased 50% in LT lower back and no calf pain    Baseline pain 25% in back    Time 6    Period Weeks    Status New      PT LONG TERM GOAL #3   Title She will be able o bend as far as able with trunk without calf pain    Baseline pain after 60 degrees.    Time 6    Period Weeks    Status New      PT LONG TERM GOAL #4   Title She will be able to walk for exercise 1/4 to 1/2 mile on track without stopping due to pain    Baseline not able to walk 100 yards without incr pain    Time 6    Period Weeks  Status New                 Plan - 12/09/19 0920    Clinical Impression Statement Increased soreness but this improved with stretching. Discussed the need to stretch and strengthen but to not have increased intense pain and should stop or change postions. Will use modalities and  manual nextt session. Asked her to contact MD about ordered MRI    PT Treatment/Interventions  Electrical Stimulation;Iontophoresis 4mg /ml Dexamethasone;Moist Heat;Therapeutic exercise;Therapeutic activities;Patient/family education;Passive range of motion;Dry needling;Manual techniques;Taping;ADLs/Self Care Home Management;Biofeedback;Cryotherapy;Neuromuscular re-education    PT Next Visit Plan progress core strength; gluteal and lumbar stretches; f/u on self massage  as needed  Modalities and manual    PT Home Exercise Plan [id number]    Consulted and Agree with Plan of Care Patient           Patient will benefit from skilled therapeutic intervention in order to improve the following deficits and impairments:  Pain, Increased muscle spasms, Decreased strength, Decreased activity tolerance, Difficulty walking, Impaired tone, Decreased coordination  Visit Diagnosis: Chronic bilateral low back pain with left-sided sciatica  Abnormal posture  Muscle spasm of back     Problem List Patient Active Problem List   Diagnosis Date Noted  . 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 Hemorrhoids 01/21/2019  . Essential hypertension 01/21/2019  . Serum calcium elevated 01/21/2019  . Elevated cholesterol 01/19/2019  . Diabetes mellitus ([place]) 01/19/2019  . Trigger ring finger of left hand 01/11/2019  . Trigger finger of right thumb 01/11/2019  . 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 Acute bronchitis 08/08/2015  . Leukocytosis 08/08/2015  . Hyperglycemia 08/08/2015  . Tobacco abuse 08/08/2015  . 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 BACTERIAL PNEUMONIA, RIGHT LOWER LOBE 04/27/2008  . LUMP OR MASS IN BREAST 04/27/2008  . COUGH 04/27/2008    Darrel Hoover  PT 12/09/2019, 9:58 AM  [facility] [address] [place], [place], [address] Phone: [phone]   Fax:  [phone]  Name: Alisha Espinoza MRN: [id number] Date of Birth: May 30, 1965

## 2019-12-09 NOTE — Telephone Encounter (Signed)
Patient called requesting for Dr. Junius Roads to send a referral for patient to have an MRI for hip area. Please call patient at [phone].

## 2019-12-09 NOTE — Telephone Encounter (Signed)
I called the patient: her left hip has been hurting. Advised her that she would need an ov to evaluate that hip, to determine if an MRI is needed. Coming in tomorrow morning at 10:20 to see Dr. Junius Roads for this.

## 2019-12-10 ENCOUNTER — Encounter: Payer: Self-pay | Admitting: Family Medicine

## 2019-12-10 ENCOUNTER — Ambulatory Visit (INDEPENDENT_AMBULATORY_CARE_PROVIDER_SITE_OTHER): Payer: Medicaid Other | Admitting: Family Medicine

## 2019-12-10 DIAGNOSIS — G8929 Other chronic pain: Secondary | ICD-10-CM | POA: Diagnosis not present

## 2019-12-10 DIAGNOSIS — M5440 Lumbago with sciatica, unspecified side: Secondary | ICD-10-CM

## 2019-12-10 NOTE — Progress Notes (Signed)
Office Visit Note   Patient: Alisha Espinoza           Date of Birth: 29-Mar-1966           MRN: [id number] Visit Date: 12/10/2019 Requested by: Drue Flirt, MD [phone],  [place] [address] PCP: Drue Flirt, MD  Subjective: Chief Complaint  Patient presents with  . Lower Back - Pain    Pain in the center of her lower back. Pain in the left groin with much walking. Yesterday, was in tears due to the pain. Some days she has no pain. The ESI helped the pain that radiated down the left leg.    HPI: She is here with left hip pain.  Pain in the anterior, intermittently for the past few months but pretty intense this week when trying to walk.  At times she is in tears because of her pain.  She had a lumbar epidural injection which helped her radicular type pain, but the groin pain did not respond to that.  She is doing physical therapy.               ROS:   All other systems were reviewed and are negative.  Objective: Vital Signs: There were no vitals taken for this visit.  Physical Exam:  General:  Alert and oriented, in no acute distress. Pulm:  Breathing unlabored. Psy:  Normal mood, congruent affect.  Left hip: She has significant pain with passive flexion and internal rotation.  Range of motion is still good.  She has a little bit of pain with hip flexion against resistance.  No significant tenderness over the greater trochanter.  Imaging: X-rays from March 22, 2018 were reviewed today, lumbar spine.  The visible portion of the left hip has well-preserved joint space, no sign of AVN.  Assessment & Plan: 1.  Left groin pain, question underlying DJD.  Cannot rule out early AVN. -We will proceed with MRI to further evaluate.  Depending on the results, could contemplate injection.     Procedures: No procedures performed  No notes on file     PMFS History: Patient Active Problem List   Diagnosis Date Noted  . 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  Hemorrhoids 01/21/2019  . Essential hypertension 01/21/2019  . Serum calcium elevated 01/21/2019  . Elevated cholesterol 01/19/2019  . Diabetes mellitus ([place]) 01/19/2019  . Trigger ring finger of left hand 01/11/2019  . Trigger finger of right thumb 01/11/2019  . 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 Acute bronchitis 08/08/2015  . Leukocytosis 08/08/2015  . Hyperglycemia 08/08/2015  . Tobacco abuse 08/08/2015  . 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 BACTERIAL PNEUMONIA, RIGHT LOWER LOBE 04/27/2008  . LUMP OR MASS IN BREAST 04/27/2008  . COUGH 04/27/2008   Past Medical History:  Diagnosis Date  . Bronchitis   . Diabetes mellitus ([place]) 01/19/2019  . Dyspnea   . Heart murmur    resolved "closed by age 50."   . Hyperlipidemia   . Hypertension   . Seasonal allergies     Family History  Problem Relation Age of Onset  . Diabetes Mother   . Hypertension Father   . Pancreatic cancer Sister   . Colon cancer Neg Hx   . Rectal cancer Neg Hx   . Stomach cancer Neg Hx   . Colon polyps Neg Hx   . Esophageal cancer Neg Hx     Past Surgical History:  Procedure Laterality Date  . ABDOMINAL  HYSTERECTOMY  2000  . ANTERIOR CERVICAL DECOMP/DISCECTOMY FUSION  03/19/2012   Procedure: ANTERIOR CERVICAL DECOMPRESSION/DISCECTOMY FUSION 1 LEVEL;  Surgeon: Melina Schools, MD;  Location: [place];  Service: Orthopedics;  Laterality: Left;  Total Disc Replacement C4-5  . ANTERIOR CERVICAL DECOMP/DISCECTOMY FUSION N/A 05/07/2012   Procedure: ANTERIOR CERVICAL DECOMPRESSION/DISCECTOMY FUSION 1 LEVEL/HARDWARE REMOVAL;  Surgeon: Melina Schools, MD;  Location: [place];  Service: Orthopedics;  Laterality: N/A;  REMOVAL OF CERVICAL DISC REPLACEMENT AND ACDF C4-5  . BACK SURGERY    . CARPAL TUNNEL RELEASE Right 09/28/2018   Procedure: CARPAL TUNNEL RELEASE;  Surgeon: Eustace Moore, MD;  Location: [place];  Service: Neurosurgery;  Laterality: Right;  CARPAL TUNNEL RELEASE  . CERVICAL FUSION  05/07/2012   Dr  Rolena Infante  . COLONOSCOPY    . CYSTECTOMY     L wrist  . POLYPECTOMY    . ROTATOR CUFF REPAIR     Right  . TRIGGER FINGER RELEASE Left 05/03/2019   Procedure: LEFT LONG AND RING FINGER RELEASE TRIGGER FINGER/A-1 PULLEY;  Surgeon: Leanora Cover, MD;  Location: [place];  Service: Orthopedics;  Laterality: Left;  beir block  . TRIGGER FINGER RELEASE Right 09/23/2019   Procedure: RELEASE TRIGGER FINGER/A-1 PULLEY RIGHT THUMB, LONG AND RING FINGERS;  Surgeon: Leanora Cover, MD;  Location: [place];  Service: Orthopedics;  Laterality: Right;  . TYMPANOSTOMY TUBE PLACEMENT     Social History   Occupational History  . Not on file  Tobacco Use  . Smoking status: Current Every Day Smoker    Packs/day: 0.50    Years: 30.00    Pack years: 15.00    Types: Cigarettes  . Smokeless tobacco: Never Used  . Tobacco comment: 10cigs/day  Vaping Use  . Vaping Use: Never used  Substance and Sexual Activity  . Alcohol use: Yes    Comment: occasionally  . Drug use: No  . Sexual activity: Yes    Birth control/protection: Surgical    Comment: Hysterectomy

## 2019-12-13 ENCOUNTER — Other Ambulatory Visit: Payer: Self-pay

## 2019-12-13 ENCOUNTER — Ambulatory Visit: Payer: Medicaid Other

## 2019-12-13 DIAGNOSIS — M5442 Lumbago with sciatica, left side: Secondary | ICD-10-CM | POA: Diagnosis not present

## 2019-12-13 DIAGNOSIS — M62830 Muscle spasm of back: Secondary | ICD-10-CM

## 2019-12-13 DIAGNOSIS — R293 Abnormal posture: Secondary | ICD-10-CM

## 2019-12-13 DIAGNOSIS — G8929 Other chronic pain: Secondary | ICD-10-CM

## 2019-12-13 NOTE — Therapy (Addendum)
[place], [place], [address] Phone: [phone]   Fax:  [phone]  Physical Therapy Treatment/Discharge  Patient Details  Name: Alisha Espinoza MRN: [id number] Date of Birth: 06/18/1965 Referring Provider (PT): Eunice Blase, MD   Encounter Date: 12/13/2019   PT End of Session - 12/13/19 1442    Visit Number 3    Number of Visits 12    Date for PT Re-Evaluation 01/14/20    Authorization Type [employer]    Authorization Time Period 12/28/19    Authorization - Visit Number 2    Authorization - Number of Visits 27    PT Start Time 0200    PT Stop Time 0250    PT Time Calculation (min) 50 min    Activity Tolerance Patient limited by pain;Patient tolerated treatment well    Behavior During Therapy [facility] for tasks assessed/performed           Past Medical History:  Diagnosis Date  . Bronchitis   . Diabetes mellitus ([place]) 01/19/2019  . Dyspnea   . Heart murmur    resolved "closed by age 64."   . Hyperlipidemia   . Hypertension   . Seasonal allergies     Past Surgical History:  Procedure Laterality Date  . ABDOMINAL HYSTERECTOMY  2000  . ANTERIOR CERVICAL DECOMP/DISCECTOMY FUSION  03/19/2012   Procedure: ANTERIOR CERVICAL DECOMPRESSION/DISCECTOMY FUSION 1 LEVEL;  Surgeon: Melina Schools, MD;  Location: [place];  Service: Orthopedics;  Laterality: Left;  Total Disc Replacement C4-5  . ANTERIOR CERVICAL DECOMP/DISCECTOMY FUSION N/A 05/07/2012   Procedure: ANTERIOR CERVICAL DECOMPRESSION/DISCECTOMY FUSION 1 LEVEL/HARDWARE REMOVAL;  Surgeon: Melina Schools, MD;  Location: [place];  Service: Orthopedics;  Laterality: N/A;  REMOVAL OF CERVICAL DISC REPLACEMENT AND ACDF C4-5  . BACK SURGERY    . CARPAL TUNNEL RELEASE Right 09/28/2018   Procedure: CARPAL TUNNEL RELEASE;  Surgeon: Eustace Moore, MD;  Location: [place];  Service: Neurosurgery;  Laterality: Right;  CARPAL TUNNEL RELEASE  . CERVICAL FUSION  05/07/2012    Dr Rolena Infante  . COLONOSCOPY    . CYSTECTOMY     L wrist  . POLYPECTOMY    . ROTATOR CUFF REPAIR     Right  . TRIGGER FINGER RELEASE Left 05/03/2019   Procedure: LEFT LONG AND RING FINGER RELEASE TRIGGER FINGER/A-1 PULLEY;  Surgeon: Leanora Cover, MD;  Location: [place];  Service: Orthopedics;  Laterality: Left;  beir block  . TRIGGER FINGER RELEASE Right 09/23/2019   Procedure: RELEASE TRIGGER FINGER/A-1 PULLEY RIGHT THUMB, LONG AND RING FINGERS;  Surgeon: Leanora Cover, MD;  Location: [place];  Service: Orthopedics;  Laterality: Right;  . TYMPANOSTOMY TUBE PLACEMENT      There were no vitals filed for this visit.   Subjective Assessment - 12/13/19 1445    Subjective She was better with all Exercises today.    pain less .   Still painful with ROM of back and hips.    Pain Score 3     Pain Location Back   and hips   Pain Orientation Left    Pain Descriptors / Indicators Sore    Pain Type Chronic pain    Pain Onset More than a month ago    Pain Frequency Constant    Aggravating Factors  stand /walk    Pain Relieving Factors meds , heat  [place] Adult PT Treatment/Exercise - 12/13/19 0001      Lumbar Exercises: Stretches   Single Knee to Chest Stretch Right;Left;2 reps;30 seconds    Lower Trunk Rotation 5 reps;10 seconds    Other Lumbar Stretch Exercise bilateral clam x 12      Lumbar Exercises: Aerobic   Nustep L 5 UE/LE 6 min      Lumbar Exercises: Supine   Pelvic Tilt 15 reps;5 seconds    Clam 15 reps    Clam Limitations red band with glute set    Bent Knee Raise 15 reps    Bridge 15 reps    Large Ball Abdominal Isometric 10 reps;2 seconds    Other Supine Lumbar Exercises Ball squeeze at knees with PPT/ab set      Moist Heat Therapy   Number Minutes Moist Heat 15 Minutes    Moist Heat Location Lumbar Spine      Electrical Stimulation   Electrical Stimulation Location Lower back     Electrical Stimulation Action IFC    Electrical Stimulation Parameters to tolerance    Electrical Stimulation Goals Pain      Manual Therapy   Manual Therapy Manual Traction    Manual Traction RT/LT leg  Some back pain so will not do again                    PT Short Term Goals - 12/13/19 1440      PT SHORT TERM GOAL #1   Title She will be indpendent with initial HEP    Status On-going      PT SHORT TERM GOAL #2   Title She will report at least 25% less pain  in lower back on LT side    Status On-going      PT SHORT TERM GOAL #3   Title She will be able to bend to 60 degrees without calf pain    Status Unable to assess             PT Long Term Goals - 11/25/19 1707      PT LONG TERM GOAL #1   Title She will be indpendent with all HEP issued    Baseline independent with initial hEP    Time 6    Period Weeks    Status New      PT LONG TERM GOAL #2   Title She will report pain decreased 50% in LT lower back and no calf pain    Baseline pain 25% in back    Time 6    Period Weeks    Status New      PT LONG TERM GOAL #3   Title She will be able o bend as far as able with trunk without calf pain    Baseline pain after 60 degrees.    Time 6    Period Weeks    Status New      PT LONG TERM GOAL #4   Title She will be able to walk for exercise 1/4 to 1/2 mile on track without stopping due to pain    Baseline not able to walk 100 yards without incr pain    Time 6    Period Weeks    Status New                 Plan - 12/13/19 1439    Clinical Impression Statement Less pain today compared to last week She is cheduled for MRI in next  2-3 weeks. If MRI does not show anything specific will push pain education for chronic pain.  She liked to have her leg pulled.    PT Treatment/Interventions Electrical Stimulation;Iontophoresis 54m/ml Dexamethasone;Moist Heat;Therapeutic exercise;Therapeutic activities;Patient/family education;Passive range of motion;Dry  needling;Manual techniques;Taping;ADLs/Self Care Home Management;Biofeedback;Cryotherapy;Neuromuscular re-education    PT Next Visit Plan progress core strength; gluteal and lumbar stretches;   as needed  Modalities and manual   check ROM    Consulted and Agree with Plan of Care Patient           Patient will benefit from skilled therapeutic intervention in order to improve the following deficits and impairments:  Pain, Increased muscle spasms, Decreased strength, Decreased activity tolerance, Difficulty walking, Impaired tone, Decreased coordination  Visit Diagnosis: Chronic bilateral low back pain with left-sided sciatica  Abnormal posture  Muscle spasm of back     Problem List Patient Active Problem List   Diagnosis Date Noted  . 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 Hemorrhoids 01/21/2019  . Essential hypertension 01/21/2019  . Serum calcium elevated 01/21/2019  . Elevated cholesterol 01/19/2019  . Diabetes mellitus ([place] 01/19/2019  . Trigger ring finger of left hand 01/11/2019  . Trigger finger of right thumb 01/11/2019  . 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 Acute bronchitis 08/08/2015  . Leukocytosis 08/08/2015  . Hyperglycemia 08/08/2015  . Tobacco abuse 08/08/2015  . 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 BACTERIAL PNEUMONIA, RIGHT LOWER LOBE 04/27/2008  . LUMP OR MASS IN BREAST 04/27/2008  . COUGH 04/27/2008    CDarrel Hoover  PT  12/13/2019, 2:48 PM  [facility] [address]Phone: [phone]  Fax:  [phone] Name: Alisha KILLIANMRN: [id number] of Birth: 04/05/1965-04-16 PHYSICAL THERAPY DISCHARGE SUMMARY  Visits from Start of Care: 3  Current functional level related to goals / functional outcomes: She has had some transportation issues so has not been to PT   Will need new order to resume PT   Remaining deficits: Unkown   Education /  Equipment: HEP Plan:                                                    Patient goals were not met. Patient is being discharged due to not returning since the last visit.  ?????   SPearson ForsterPT    02/08/20

## 2019-12-16 ENCOUNTER — Ambulatory Visit: Payer: Medicaid Other

## 2019-12-17 ENCOUNTER — Encounter
Payer: Medicaid Other | Attending: Physical Medicine and Rehabilitation | Admitting: Physical Medicine and Rehabilitation

## 2019-12-17 DIAGNOSIS — G4701 Insomnia due to medical condition: Secondary | ICD-10-CM | POA: Insufficient documentation

## 2019-12-17 DIAGNOSIS — M5417 Radiculopathy, lumbosacral region: Secondary | ICD-10-CM | POA: Insufficient documentation

## 2019-12-17 DIAGNOSIS — Z5181 Encounter for therapeutic drug level monitoring: Secondary | ICD-10-CM | POA: Insufficient documentation

## 2019-12-17 DIAGNOSIS — G894 Chronic pain syndrome: Secondary | ICD-10-CM | POA: Insufficient documentation

## 2019-12-17 DIAGNOSIS — Z79891 Long term (current) use of opiate analgesic: Secondary | ICD-10-CM | POA: Insufficient documentation

## 2019-12-20 ENCOUNTER — Ambulatory Visit: Payer: Medicaid Other | Admitting: Podiatry

## 2020-01-01 ENCOUNTER — Other Ambulatory Visit: Payer: Self-pay

## 2020-01-01 ENCOUNTER — Ambulatory Visit
Admission: RE | Admit: 2020-01-01 | Discharge: 2020-01-01 | Disposition: A | Discharge status: Home / Self Care | Payer: Medicaid Other | Source: Ambulatory Visit | Attending: Family Medicine | Admitting: Family Medicine

## 2020-01-01 DIAGNOSIS — M5440 Lumbago with sciatica, unspecified side: Secondary | ICD-10-CM

## 2020-01-01 DIAGNOSIS — G8929 Other chronic pain: Secondary | ICD-10-CM

## 2020-01-03 ENCOUNTER — Telehealth: Payer: Self-pay | Admitting: Family Medicine

## 2020-01-03 NOTE — Telephone Encounter (Signed)
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  Otherwise unremarkable.  If still in severe pain, could contemplate a hip injection.  If pain is milder, then I would suggest:  Glucosamine sulfate:  1,000 mg twice daily  Turmeric:  500 mg twice daily

## 2020-01-04 ENCOUNTER — Other Ambulatory Visit: Payer: Self-pay

## 2020-01-04 ENCOUNTER — Encounter
Payer: Medicaid Other | Attending: Physical Medicine and Rehabilitation | Admitting: Physical Medicine and Rehabilitation

## 2020-01-04 ENCOUNTER — Encounter: Payer: Self-pay | Admitting: Physical Medicine and Rehabilitation

## 2020-01-04 VITALS — BP 112/81 | HR 87 | Temp 97.9°F | Wt 198.0 lb

## 2020-01-04 DIAGNOSIS — M7918 Myalgia, other site: Secondary | ICD-10-CM

## 2020-01-04 DIAGNOSIS — M25552 Pain in left hip: Secondary | ICD-10-CM | POA: Insufficient documentation

## 2020-01-04 DIAGNOSIS — M48062 Spinal stenosis, lumbar region with neurogenic claudication: Secondary | ICD-10-CM | POA: Insufficient documentation

## 2020-01-04 MED ORDER — TRAMADOL HCL 50 MG PO TABS: 50.0000 mg | ORAL_TABLET | Freq: Three times a day (TID) | ORAL | 0 refills | Status: DC | PRN

## 2020-01-04 NOTE — Progress Notes (Signed)
Subjective:    Patient ID: Alisha Espinoza, female    DOB: Jan 20, 1966, 54 y.o.   MRN: [id number]  HPI  [name] is a 54 year old woman s/p multiple spinal surgeries, who presents for follow-up of left sided lower back pain radiating into her left leg.  Current medications: Tramadol 50mg  2-3 time per day.  She takes the Ibuprofen once per day. She has been eating with this. It does not bother her belly. She has been taking 800mg  once per day.  These are both helping.  Her car broke down so she has not been waling as much recently. She was able to do half a mile. She walked 1 mile one day after she got the steroid injection.   The pain has started to come back a little bit after her injection.   MRI lumbar spine reviewed with patient as follows: 1. Broad-based disc protrusion and bilateral facet hypertrophy at L3-4 results in severe right and moderate left subarticular narrowing. Moderate foraminal stenosis is present bilaterally. 2. Large right lateral disc protrusion at L4-5 with severe right and mild left subarticular narrowing. 3. Moderate right and mild left foraminal narrowing at L4-5. 4. Leftward disc protrusion at L5-S1 extends into the left neural foramen without significant stenosis. 5. Broad-based disc protrusion and mild bilateral facet hypertrophy at L2-3 with mild subarticular and foraminal narrowing bilaterally.   Pain Inventory Average Pain 6 Pain Right Now 4 My pain is sharp and aching  In the last 24 hours, has pain interfered with the following? General activity 5 Relation with others 5 Enjoyment of life 7 What TIME of day is your pain at its worst? morning , daytime, evening and night Sleep (in general) Poor  Pain is worse with: walking, bending, sitting, standing and some activites Pain improves with: rest, heat/ice, therapy/exercise, pacing activities and medication Relief from Meds: 5  Family History  Problem Relation Age of Onset  . Diabetes  Mother   . Hypertension Father   . Pancreatic cancer Sister   . Colon cancer Neg Hx   . Rectal cancer Neg Hx   . Stomach cancer Neg Hx   . Colon polyps Neg Hx   . Esophageal cancer Neg Hx    Social History   Socioeconomic History  . Marital status: Divorced    Spouse name: Not on file  . Number of children: Not on file  . Years of education: Not on file  . Highest education level: Not on file  Occupational History  . Not on file  Tobacco Use  . Smoking status: Current Every Day Smoker    Packs/day: 0.50    Years: 30.00    Pack years: 15.00    Types: Cigarettes  . Smokeless tobacco: Never Used  . Tobacco comment: 10cigs/day  Vaping Use  . Vaping Use: Never used  Substance and Sexual Activity  . Alcohol use: Yes    Comment: occasionally  . Drug use: No  . Sexual activity: Yes    Birth control/protection: Surgical    Comment: Hysterectomy  Other Topics Concern  . Not on file  Social History Narrative  . Not on file   Social Determinants of Health   Financial Resource Strain:   . Difficulty of Paying Living Expenses: Not on file  Food Insecurity:   . Worried About Charity fundraiser in the Last Year: Not on file  . Ran Out of Food in the Last Year: Not on file  Transportation Needs:   .  Lack of Transportation (Medical): Not on file  . Lack of Transportation (Non-Medical): Not on file  Physical Activity:   . Days of Exercise per Week: Not on file  . Minutes of Exercise per Session: Not on file  Stress:   . Feeling of Stress : Not on file  Social Connections:   . Frequency of Communication with Friends and Family: Not on file  . Frequency of Social Gatherings with Friends and Family: Not on file  . Attends Religious Services: Not on file  . Active Member of Clubs or Organizations: Not on file  . Attends Archivist Meetings: Not on file  . Marital Status: Not on file   Past Surgical History:  Procedure Laterality Date  . ABDOMINAL HYSTERECTOMY   2000  . ANTERIOR CERVICAL DECOMP/DISCECTOMY FUSION  03/19/2012   Procedure: ANTERIOR CERVICAL DECOMPRESSION/DISCECTOMY FUSION 1 LEVEL;  Surgeon: Melina Schools, MD;  Location: [place];  Service: Orthopedics;  Laterality: Left;  Total Disc Replacement C4-5  . ANTERIOR CERVICAL DECOMP/DISCECTOMY FUSION N/A 05/07/2012   Procedure: ANTERIOR CERVICAL DECOMPRESSION/DISCECTOMY FUSION 1 LEVEL/HARDWARE REMOVAL;  Surgeon: Melina Schools, MD;  Location: [place];  Service: Orthopedics;  Laterality: N/A;  REMOVAL OF CERVICAL DISC REPLACEMENT AND ACDF C4-5  . BACK SURGERY    . CARPAL TUNNEL RELEASE Right 09/28/2018   Procedure: CARPAL TUNNEL RELEASE;  Surgeon: Eustace Moore, MD;  Location: [place];  Service: Neurosurgery;  Laterality: Right;  CARPAL TUNNEL RELEASE  . CERVICAL FUSION  05/07/2012   Dr Rolena Infante  . COLONOSCOPY    . CYSTECTOMY     L wrist  . POLYPECTOMY    . ROTATOR CUFF REPAIR     Right  . TRIGGER FINGER RELEASE Left 05/03/2019   Procedure: LEFT LONG AND RING FINGER RELEASE TRIGGER FINGER/A-1 PULLEY;  Surgeon: Leanora Cover, MD;  Location: [place];  Service: Orthopedics;  Laterality: Left;  beir block  . TRIGGER FINGER RELEASE Right 09/23/2019   Procedure: RELEASE TRIGGER FINGER/A-1 PULLEY RIGHT THUMB, LONG AND RING FINGERS;  Surgeon: Leanora Cover, MD;  Location: [place];  Service: Orthopedics;  Laterality: Right;  . TYMPANOSTOMY TUBE PLACEMENT     Past Surgical History:  Procedure Laterality Date  . ABDOMINAL HYSTERECTOMY  2000  . ANTERIOR CERVICAL DECOMP/DISCECTOMY FUSION  03/19/2012   Procedure: ANTERIOR CERVICAL DECOMPRESSION/DISCECTOMY FUSION 1 LEVEL;  Surgeon: Melina Schools, MD;  Location: [place];  Service: Orthopedics;  Laterality: Left;  Total Disc Replacement C4-5  . ANTERIOR CERVICAL DECOMP/DISCECTOMY FUSION N/A 05/07/2012   Procedure: ANTERIOR CERVICAL DECOMPRESSION/DISCECTOMY FUSION 1 LEVEL/HARDWARE REMOVAL;  Surgeon: Melina Schools, MD;  Location: [place];   Service: Orthopedics;  Laterality: N/A;  REMOVAL OF CERVICAL DISC REPLACEMENT AND ACDF C4-5  . BACK SURGERY    . CARPAL TUNNEL RELEASE Right 09/28/2018   Procedure: CARPAL TUNNEL RELEASE;  Surgeon: Eustace Moore, MD;  Location: [place];  Service: Neurosurgery;  Laterality: Right;  CARPAL TUNNEL RELEASE  . CERVICAL FUSION  05/07/2012   Dr Rolena Infante  . COLONOSCOPY    . CYSTECTOMY     L wrist  . POLYPECTOMY    . ROTATOR CUFF REPAIR     Right  . TRIGGER FINGER RELEASE Left 05/03/2019   Procedure: LEFT LONG AND RING FINGER RELEASE TRIGGER FINGER/A-1 PULLEY;  Surgeon: Leanora Cover, MD;  Location: [place];  Service: Orthopedics;  Laterality: Left;  beir block  . TRIGGER FINGER RELEASE Right 09/23/2019   Procedure: RELEASE TRIGGER FINGER/A-1 PULLEY RIGHT  THUMB, LONG AND RING FINGERS;  Surgeon: Leanora Cover, MD;  Location: [place];  Service: Orthopedics;  Laterality: Right;  . TYMPANOSTOMY TUBE PLACEMENT     Past Medical History:  Diagnosis Date  . Bronchitis   . Diabetes mellitus ([place]) 01/19/2019  . Dyspnea   . Heart murmur    resolved "closed by age 27."   . Hyperlipidemia   . Hypertension   . Seasonal allergies    BP 112/81   Pulse 87   Temp 97.9 F (36.6 C)   Wt 198 lb (89.8 kg)   SpO2 96%   BMI 31.96 kg/m   Opioid Risk Score:   Fall Risk Score:  `1  Depression screen PHQ 2/9  Depression screen [facility] 2/9 11/16/2019 10/08/2019 08/24/2019 01/25/2019  Decreased Interest 0 0 2 0  Down, Depressed, Hopeless 0 0 2 1  PHQ - 2 Score 0 0 4 1  Altered sleeping - - 3 -  Tired, decreased energy - - 1 -  Change in appetite - - 1 -  Feeling bad or failure about yourself  - - 1 -  Trouble concentrating - - 1 -  Moving slowly or fidgety/restless - - 3 -  Suicidal thoughts - - 0 -  PHQ-9 Score - - 14 -  Difficult doing work/chores - - Somewhat difficult -  Some recent data might be hidden    Review of Systems  Constitutional: Negative.   HENT: Negative.    Eyes: Negative.   Respiratory: Negative.   Cardiovascular: Negative.   Gastrointestinal: Negative.   Endocrine: Negative.   Genitourinary: Negative.   Musculoskeletal: Positive for arthralgias and back pain.  Skin: Negative.   Allergic/Immunologic: Negative.   Neurological: Positive for weakness.       Tingling   Psychiatric/Behavioral: Negative.   All other systems reviewed and are negative.      Objective:   Physical Exam Gen: no distress, normal appearing HEENT: oral mucosa pink and moist, NCAT Cardio: Reg rate Chest: normal effort, normal rate of breathing Abd: soft, non-distended Ext: no edema Skin: intact Neuro: Alert and oriented x3.  Musculoskeletal: 5/5 strength throughout bilateral lower extremities. Sensation intact. Left hip tenderness to palpation over greater trochanter Psych: pleasant, normal affect     Assessment & Plan:  Mrs. Boxley is a 54 year old woman who presents with left sided low back pain radiating into her leg secondary to her spinal stenosis.  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 -Discussed current symptoms of pain and history of pain.  -Discussed benefits of exercise in reducing pain -Made goal to walk three times per week 1/2 a mile at least and eat 2TB olive oil per day for anti-inflammatory benefits. -Refilled Tramadol, MME 15  Left hip pain -reviewed MRI with patient that shows mild degeneration and fraying of superior labrum -Advised continued PT and home exercises -Discussed prolotherapy and advised her to read more about this option.

## 2020-01-04 NOTE — Patient Instructions (Signed)
Prolotherapy

## 2020-01-04 NOTE — Telephone Encounter (Signed)
I called and advised the patient. The pain is mild and off & on right now. Advised her of the glucosamine and turmeric to try bid. If her pain worsens, she will let us know.

## 2020-01-17 ENCOUNTER — Ambulatory Visit: Payer: Medicaid Other | Admitting: Family Medicine

## 2020-01-19 ENCOUNTER — Ambulatory Visit (INDEPENDENT_AMBULATORY_CARE_PROVIDER_SITE_OTHER): Payer: Medicaid Other | Admitting: Family Medicine

## 2020-01-19 ENCOUNTER — Other Ambulatory Visit: Payer: Self-pay

## 2020-01-19 ENCOUNTER — Encounter: Payer: Self-pay | Admitting: Family Medicine

## 2020-01-19 DIAGNOSIS — M5440 Lumbago with sciatica, unspecified side: Secondary | ICD-10-CM

## 2020-01-19 DIAGNOSIS — M25552 Pain in left hip: Secondary | ICD-10-CM | POA: Diagnosis not present

## 2020-01-19 DIAGNOSIS — G8929 Other chronic pain: Secondary | ICD-10-CM

## 2020-01-19 NOTE — Progress Notes (Signed)
Office Visit Note   Patient: Alisha Espinoza           Date of Birth: 1966-03-28           MRN: [id number] Visit Date: 01/19/2020 Requested by: Drue Flirt, MD [phone],  [place] [address] PCP: Drue Flirt, MD  Subjective: Chief Complaint  Patient presents with  . Lower Back - Pain, Follow-up    Some improvement in the pain, but continues to have pain in the lower back and into the lateral hips with much walking. Was unable to continue PT due to transportation issues - car broke down & hasn't been able to get it fixed yet.  . Left Hip - Pain, Follow-up    HPI: She is here for follow-up chronic back and hip pain.  MRI of the hip showed mild to moderate osteoarthritis.  She still having pain mostly on the anterolateral aspect.  Still having midline low back pain as well.  She had an epidural injection which gave her temporary mild relief.  She has difficulty walking longer distances, especially when walking downhill.                ROS:   All other systems were reviewed and are negative.  Objective: Vital Signs: There were no vitals taken for this visit.  Physical Exam:  General:  Alert and oriented, in no acute distress. Pulm:  Breathing unlabored. Psy:  Normal mood, congruent affect.  Hips: She has good range of motion still with passive flexion and internal/external rotation.  Mild pain with internal rotation but most of her pain is with palpation of the greater trochanter of both hips.  Lower extremity strength and reflexes are normal.  Imaging: No results found.  Assessment & Plan: 1.  Chronic back pain, managed by pain clinic.  2.  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 -Resume physical therapy when transportation allows. -Could do greater trochanter injection in the future if needed.     Procedures: No procedures performed  No notes on file     PMFS  History: Patient Active Problem List   Diagnosis Date Noted  . 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 Hemorrhoids 01/21/2019  . Essential hypertension 01/21/2019  . Serum calcium elevated 01/21/2019  . Elevated cholesterol 01/19/2019  . Diabetes mellitus ([place]) 01/19/2019  . Trigger ring finger of left hand 01/11/2019  . Trigger finger of right thumb 01/11/2019  . 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 Acute bronchitis 08/08/2015  . Leukocytosis 08/08/2015  . Hyperglycemia 08/08/2015  . Tobacco abuse 08/08/2015  . 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 BACTERIAL PNEUMONIA, RIGHT LOWER LOBE 04/27/2008  . LUMP OR MASS IN BREAST 04/27/2008  . COUGH 04/27/2008   Past Medical History:  Diagnosis Date  . Bronchitis   . Diabetes mellitus ([place]) 01/19/2019  . Dyspnea   . Heart murmur    resolved "closed by age 18."   . Hyperlipidemia   . Hypertension   . Seasonal allergies     Family History  Problem Relation Age of Onset  . Diabetes Mother   . Hypertension Father   . Pancreatic cancer Sister   . Colon cancer Neg Hx   . Rectal cancer Neg Hx   . Stomach cancer Neg Hx   . Colon polyps Neg Hx   . Esophageal cancer Neg Hx     Past Surgical History:  Procedure Laterality Date  . ABDOMINAL  HYSTERECTOMY  2000  . ANTERIOR CERVICAL DECOMP/DISCECTOMY FUSION  03/19/2012   Procedure: ANTERIOR CERVICAL DECOMPRESSION/DISCECTOMY FUSION 1 LEVEL;  Surgeon: Melina Schools, MD;  Location: [place];  Service: Orthopedics;  Laterality: Left;  Total Disc Replacement C4-5  . ANTERIOR CERVICAL DECOMP/DISCECTOMY FUSION N/A 05/07/2012   Procedure: ANTERIOR CERVICAL DECOMPRESSION/DISCECTOMY FUSION 1 LEVEL/HARDWARE REMOVAL;  Surgeon: Melina Schools, MD;  Location: [place];  Service: Orthopedics;  Laterality: N/A;  REMOVAL OF CERVICAL DISC REPLACEMENT AND ACDF C4-5  . BACK SURGERY    . CARPAL TUNNEL RELEASE Right 09/28/2018   Procedure: CARPAL TUNNEL RELEASE;  Surgeon: Eustace Moore, MD;  Location: [place];  Service:  Neurosurgery;  Laterality: Right;  CARPAL TUNNEL RELEASE  . CERVICAL FUSION  05/07/2012   Dr Rolena Infante  . COLONOSCOPY    . CYSTECTOMY     L wrist  . POLYPECTOMY    . ROTATOR CUFF REPAIR     Right  . TRIGGER FINGER RELEASE Left 05/03/2019   Procedure: LEFT LONG AND RING FINGER RELEASE TRIGGER FINGER/A-1 PULLEY;  Surgeon: Leanora Cover, MD;  Location: [place];  Service: Orthopedics;  Laterality: Left;  beir block  . TRIGGER FINGER RELEASE Right 09/23/2019   Procedure: RELEASE TRIGGER FINGER/A-1 PULLEY RIGHT THUMB, LONG AND RING FINGERS;  Surgeon: Leanora Cover, MD;  Location: [place];  Service: Orthopedics;  Laterality: Right;  . TYMPANOSTOMY TUBE PLACEMENT     Social History   Occupational History  . Not on file  Tobacco Use  . Smoking status: Current Every Day Smoker    Packs/day: 0.50    Years: 30.00    Pack years: 15.00    Types: Cigarettes  . Smokeless tobacco: Never Used  . Tobacco comment: 10cigs/day  Vaping Use  . Vaping Use: Never used  Substance and Sexual Activity  . Alcohol use: Yes    Comment: occasionally  . Drug use: No  . Sexual activity: Yes    Birth control/protection: Surgical    Comment: Hysterectomy

## 2020-01-25 ENCOUNTER — Other Ambulatory Visit: Payer: Self-pay | Admitting: Physical Medicine and Rehabilitation

## 2020-01-31 ENCOUNTER — Ambulatory Visit: Payer: Medicaid Other | Admitting: Podiatry

## 2020-02-11 ENCOUNTER — Telehealth: Payer: Self-pay

## 2020-02-11 ENCOUNTER — Telehealth: Payer: Self-pay | Admitting: Family Medicine

## 2020-02-11 NOTE — Telephone Encounter (Signed)
I called the patient. She will try calling the PT facility on Monday to set up a new appointment.

## 2020-02-11 NOTE — Telephone Encounter (Signed)
Pt called asking Dr. Junius Roads resend her PT referral to the [facility] location since she wasn't able to set any appts when he sent the referral last time.

## 2020-02-11 NOTE — Telephone Encounter (Signed)
I have called her to discuss!

## 2020-02-11 NOTE — Telephone Encounter (Signed)
Would her last referral not still be good?

## 2020-02-11 NOTE — Telephone Encounter (Signed)
Patient called stating she wanted to discuss shots that she is suppose to be getting at next visit. I do not see where patient is suppose to be getting an injection. Please advice.

## 2020-02-11 NOTE — Telephone Encounter (Signed)
I have no idea what she is talking about, her referral was sent to [facility] st pt and she was scheduled.

## 2020-02-14 ENCOUNTER — Other Ambulatory Visit: Payer: Self-pay

## 2020-02-14 ENCOUNTER — Ambulatory Visit: Payer: Medicaid Other | Admitting: Podiatry

## 2020-02-14 ENCOUNTER — Telehealth: Payer: Self-pay | Admitting: Family Medicine

## 2020-02-14 DIAGNOSIS — L989 Disorder of the skin and subcutaneous tissue, unspecified: Secondary | ICD-10-CM | POA: Diagnosis not present

## 2020-02-14 DIAGNOSIS — L600 Ingrowing nail: Secondary | ICD-10-CM

## 2020-02-14 MED ORDER — OXYCODONE-ACETAMINOPHEN 5-325 MG PO TABS: 1.0000 | ORAL_TABLET | Freq: Four times a day (QID) | ORAL | 0 refills | Status: DC | PRN

## 2020-02-14 NOTE — Progress Notes (Signed)
   Subjective: 54 y.o. female presenting today for follow up evaluation of a painful skin lesion noted to the plantar aspect of the left foot as well as the lateral aspect of the left fifth toe.  Patient states that simple debridement only alleviates her symptoms for approximately 2-3 weeks.  She would like to have something more definitive performed today.    Past Medical History:  Diagnosis Date  . Bronchitis   . Diabetes mellitus ([place]) 01/19/2019  . Dyspnea   . Heart murmur    resolved "closed by age 64."   . Hyperlipidemia   . Hypertension   . Seasonal allergies     Objective: Physical Exam General: The patient is alert and oriented x3 in no acute distress.   Dermatology: Hyperkeratotic skin lesion(s) noted to the plantar aspect of the left foot approximately 1 cm in diameter. Pinpoint bleeding noted upon debridement. Skin is warm, dry and supple bilateral lower extremities. Negative for open lesions or macerations.   Vascular: Palpable pedal pulses bilaterally. No edema or erythema noted. Capillary refill within normal limits.   Neurological: Epicritic and protective threshold grossly intact bilaterally.    Musculoskeletal Exam: Pain on palpation to the noted skin lesion(s).  There is also some nail dystrophy to the lateral border of the left fifth toe that appears to have some evidence of ingrowing. Range of motion within normal limits to all pedal and ankle joints bilateral. Muscle strength 5/5 in all groups bilateral.   Assessment: #1 plantar wart left sub-fourth MPJ #2 dystrophic ingrowing nail lateral border of the left fifth toe    Plan of Care:  #1 Patient was evaluated.   #2 Excisional debridement of the plantar wart lesion(s) was performed using a chisel blade.  Cantharone was applied and the lesion(s) was dressed with a dry sterile dressing. #3 decision was made to perform a surgical nail matricectomy of the lateral border of the left fifth toe.  The procedure was  explained and all patient questions were answered.  No guarantees were expressed or implied.  The patient consented to have the procedure performed today.  The toe was prepped in aseptic manner and 3 mL of lidocaine plain was infiltrated in a digital block fashion.  A surgical #15 scalpel was utilized to surgically excised the lateral offending border of the nail plate and nail matrix.  Primary closure was achieved using 4-0 nylon suture. #4 dry sterile dressings were applied with instructions to keep clean dry and intact x1 week #5 prescription for Percocet 5/325 mg  #6 return to clinic in 1 week  Edrick Kins, DPM [facility]  Dr. Edrick Kins, [address]                                        [place], [place] [address]                Office [phone]  Fax [phone]

## 2020-02-14 NOTE — Telephone Encounter (Signed)
[place] forms received. Sent to [facility] 

## 2020-02-23 ENCOUNTER — Ambulatory Visit (INDEPENDENT_AMBULATORY_CARE_PROVIDER_SITE_OTHER): Payer: Medicaid Other | Admitting: Podiatry

## 2020-02-23 ENCOUNTER — Other Ambulatory Visit: Payer: Self-pay

## 2020-02-23 DIAGNOSIS — L600 Ingrowing nail: Secondary | ICD-10-CM

## 2020-02-23 DIAGNOSIS — B070 Plantar wart: Secondary | ICD-10-CM

## 2020-02-23 DIAGNOSIS — L989 Disorder of the skin and subcutaneous tissue, unspecified: Secondary | ICD-10-CM

## 2020-02-23 NOTE — Patient Instructions (Signed)
Corn and callus remover or Wart Remover daily available at the pharmacy.

## 2020-03-01 ENCOUNTER — Other Ambulatory Visit: Payer: Self-pay

## 2020-03-01 ENCOUNTER — Ambulatory Visit (INDEPENDENT_AMBULATORY_CARE_PROVIDER_SITE_OTHER): Payer: Medicaid Other | Admitting: Podiatry

## 2020-03-01 DIAGNOSIS — L600 Ingrowing nail: Secondary | ICD-10-CM | POA: Diagnosis not present

## 2020-03-01 DIAGNOSIS — L989 Disorder of the skin and subcutaneous tissue, unspecified: Secondary | ICD-10-CM | POA: Diagnosis not present

## 2020-03-01 DIAGNOSIS — B070 Plantar wart: Secondary | ICD-10-CM | POA: Diagnosis not present

## 2020-03-01 NOTE — Progress Notes (Signed)
   Subjective: 54 y.o. female presenting today for follow up evaluation of a painful skin lesion noted to the plantar aspect of the left foot as well as a surgical nail matricectomy with suture for primary closure to the left foot fifth toe.  Patient states she does have some moderate pain associated to the area.  No new complaints at this time   Past Medical History:  Diagnosis Date  . Bronchitis   . Diabetes mellitus ([place]) 01/19/2019  . Dyspnea   . Heart murmur    resolved "closed by age 60."   . Hyperlipidemia   . Hypertension   . Seasonal allergies     Objective: Physical Exam General: The patient is alert and oriented x3 in no acute distress.   Dermatology: Hyperkeratotic skin lesion(s) noted to the plantar aspect of the left foot approximately 1 cm in diameter. Pinpoint bleeding noted upon debridement. Skin is warm, dry and supple bilateral lower extremities. Negative for open lesions or macerations.   Vascular: Palpable pedal pulses bilaterally. No edema or erythema noted. Capillary refill within normal limits.   Neurological: Epicritic and protective threshold grossly intact bilaterally.    Musculoskeletal Exam:  Sutures are intact to the left fifth toe.  No evidence of infection.  Intimal erythema and edema noted around the toe  Assessment: #1 plantar wart left sub-fourth MPJ #2  Status post nail matricectomy left fifth toe   Plan of Care:  #1 Patient was evaluated.   #2 Excisional debridement of the plantar wart lesion(s) was performed using a chisel blade.  Salicylic acid was applied and the lesion(s) was dressed with a dry sterile dressing.  Recommend OTC wart remover x1 week #3 discontinue postsurgical shoe.  Recommend good supportive sneakers #4 sutures removed today #5 return to clinic 4 weeks   Edrick Kins, DPM [facility]  Dr. Edrick Kins, [address]                                        [place], [place] [address]                 Office [phone]  Fax [phone]

## 2020-03-01 NOTE — Progress Notes (Signed)
   Subjective: 54 y.o. female presenting today for follow up evaluation of a painful skin lesion noted to the plantar aspect of the left foot as well as a surgical nail matricectomy with suture for primary closure to the left foot fifth toe.  Patient states she does have some moderate pain associated to the area.  No new complaints at this time   Past Medical History:  Diagnosis Date  . Bronchitis   . Diabetes mellitus ([place]) 01/19/2019  . Dyspnea   . Heart murmur    resolved "closed by age 36."   . Hyperlipidemia   . Hypertension   . Seasonal allergies     Objective: Physical Exam General: The patient is alert and oriented x3 in no acute distress.   Dermatology: Hyperkeratotic skin lesion(s) noted to the plantar aspect of the left foot approximately 1 cm in diameter. Pinpoint bleeding noted upon debridement. Skin is warm, dry and supple bilateral lower extremities. Negative for open lesions or macerations.   Vascular: Palpable pedal pulses bilaterally. No edema or erythema noted. Capillary refill within normal limits.   Neurological: Epicritic and protective threshold grossly intact bilaterally.    Musculoskeletal Exam:  Sutures are intact to the left fifth toe.  No evidence of infection.  Intimal erythema and edema noted around the toe  Assessment: #1 plantar wart left sub-fourth MPJ #2  Status post nail matricectomy left fifth toe   Plan of Care:  #1 Patient was evaluated.   #2 Excisional debridement of the plantar wart lesion(s) was performed using a chisel blade.  Salicylic acid was applied and the lesion(s) was dressed with a dry sterile dressing.  Recommend OTC wart remover x1 week #3  Continue wearing postsurgical shoe #4 sutures were left intact today #5 return to clinic in 1 week for suture removal   Edrick Kins, DPM [facility]  Dr. Edrick Kins, [address]                                        [place], [place] [address]                 Office [phone]  Fax [phone]

## 2020-03-02 ENCOUNTER — Other Ambulatory Visit: Payer: Self-pay

## 2020-03-02 ENCOUNTER — Encounter
Payer: Medicaid Other | Attending: Physical Medicine and Rehabilitation | Admitting: Physical Medicine and Rehabilitation

## 2020-03-02 ENCOUNTER — Encounter: Payer: Self-pay | Admitting: Physical Medicine and Rehabilitation

## 2020-03-02 VITALS — BP 126/87 | HR 85 | Temp 98.5°F | Ht 66.0 in | Wt 196.8 lb

## 2020-03-02 DIAGNOSIS — M7918 Myalgia, other site: Secondary | ICD-10-CM | POA: Insufficient documentation

## 2020-03-02 DIAGNOSIS — M25552 Pain in left hip: Secondary | ICD-10-CM | POA: Diagnosis present

## 2020-03-02 DIAGNOSIS — Z6832 Body mass index (BMI) 32.0-32.9, adult: Secondary | ICD-10-CM | POA: Diagnosis present

## 2020-03-02 DIAGNOSIS — E669 Obesity, unspecified: Secondary | ICD-10-CM | POA: Diagnosis present

## 2020-03-02 DIAGNOSIS — M5417 Radiculopathy, lumbosacral region: Secondary | ICD-10-CM

## 2020-03-02 DIAGNOSIS — M48062 Spinal stenosis, lumbar region with neurogenic claudication: Secondary | ICD-10-CM | POA: Diagnosis present

## 2020-03-02 NOTE — Progress Notes (Signed)
Subjective:    Patient ID: Alisha Espinoza, female    DOB: 1965-11-18, 54 y.o.   MRN: [id number]  HPI  Alisha Espinoza is a 54 year old woman s/p multiple spinal surgeries, who presents for follow-up of left sided lower back pain radiating into her left leg.  Her pain comes and goes and she feels ready to return to walking. She did a mile before her foot procedure and felt good. Her current pain is worse in her bilateral groin.   She feels ready to return to work.   She lose 2 lbs since last visit.  Has been using Tramadol as needed. She is using Tramadol approximately once per day. She is tired of taking medications, getting shots, getting surgery. She does not need refill at this time.   Prior history: Current medications: Tramadol 50mg  2-3 time per day.  She takes the Ibuprofen once per day. She has been eating with this. It does not bother her belly. She has been taking 800mg  once per day.  These are both helping.  Her car broke down so she has not been waling as much recently. She was able to do half a mile. She walked 1 mile one day after she got the steroid injection.   The pain has started to come back a little bit after her injection.   MRI lumbar spine reviewed with patient as follows: 1. Broad-based disc protrusion and bilateral facet hypertrophy at L3-4 results in severe right and moderate left subarticular narrowing. Moderate foraminal stenosis is present bilaterally. 2. Large right lateral disc protrusion at L4-5 with severe right and mild left subarticular narrowing. 3. Moderate right and mild left foraminal narrowing at L4-5. 4. Leftward disc protrusion at L5-S1 extends into the left neural foramen without significant stenosis. 5. Broad-based disc protrusion and mild bilateral facet hypertrophy at L2-3 with mild subarticular and foraminal narrowing bilaterally.   Pain Inventory Average Pain 4 Pain Right Now 3 My pain is aching  In the last 24 hours, has pain  interfered with the following? General activity 3 Relation with others 0 Enjoyment of life 8 What TIME of day is your pain at its worst? morning  and daytime Sleep (in general) Poor  Pain is worse with: walking, bending, sitting, standing and some activites Pain improves with: rest, heat/ice, therapy/exercise, pacing activities and medication Relief from Meds: 2  Family History  Problem Relation Age of Onset  . Diabetes Mother   . Hypertension Father   . Pancreatic cancer Sister   . Colon cancer Neg Hx   . Rectal cancer Neg Hx   . Stomach cancer Neg Hx   . Colon polyps Neg Hx   . Esophageal cancer Neg Hx    Social History   Socioeconomic History  . Marital status: Divorced    Spouse name: Not on file  . Number of children: Not on file  . Years of education: Not on file  . Highest education level: Not on file  Occupational History  . Not on file  Tobacco Use  . Smoking status: Current Every Day Smoker    Packs/day: 0.50    Years: 30.00    Pack years: 15.00    Types: Cigarettes  . Smokeless tobacco: Never Used  . Tobacco comment: 10cigs/day  Vaping Use  . Vaping Use: Never used  Substance and Sexual Activity  . Alcohol use: Yes    Comment: occasionally  . Drug use: No  . Sexual activity: Yes  Birth control/protection: Surgical    Comment: Hysterectomy  Other Topics Concern  . Not on file  Social History Narrative  . Not on file   Social Determinants of Health   Financial Resource Strain:   . Difficulty of Paying Living Expenses: Not on file  Food Insecurity:   . Worried About Charity fundraiser in the Last Year: Not on file  . Ran Out of Food in the Last Year: Not on file  Transportation Needs:   . Lack of Transportation (Medical): Not on file  . Lack of Transportation (Non-Medical): Not on file  Physical Activity:   . Days of Exercise per Week: Not on file  . Minutes of Exercise per Session: Not on file  Stress:   . Feeling of Stress : Not on file   Social Connections:   . Frequency of Communication with Friends and Family: Not on file  . Frequency of Social Gatherings with Friends and Family: Not on file  . Attends Religious Services: Not on file  . Active Member of Clubs or Organizations: Not on file  . Attends Archivist Meetings: Not on file  . Marital Status: Not on file   Past Surgical History:  Procedure Laterality Date  . ABDOMINAL HYSTERECTOMY  2000  . ANTERIOR CERVICAL DECOMP/DISCECTOMY FUSION  03/19/2012   Procedure: ANTERIOR CERVICAL DECOMPRESSION/DISCECTOMY FUSION 1 LEVEL;  Surgeon: Melina Schools, MD;  Location: [place];  Service: Orthopedics;  Laterality: Left;  Total Disc Replacement C4-5  . ANTERIOR CERVICAL DECOMP/DISCECTOMY FUSION N/A 05/07/2012   Procedure: ANTERIOR CERVICAL DECOMPRESSION/DISCECTOMY FUSION 1 LEVEL/HARDWARE REMOVAL;  Surgeon: Melina Schools, MD;  Location: [place];  Service: Orthopedics;  Laterality: N/A;  REMOVAL OF CERVICAL DISC REPLACEMENT AND ACDF C4-5  . BACK SURGERY    . CARPAL TUNNEL RELEASE Right 09/28/2018   Procedure: CARPAL TUNNEL RELEASE;  Surgeon: Eustace Moore, MD;  Location: [place];  Service: Neurosurgery;  Laterality: Right;  CARPAL TUNNEL RELEASE  . CERVICAL FUSION  05/07/2012   Dr Rolena Infante  . COLONOSCOPY    . CYSTECTOMY     L wrist  . POLYPECTOMY    . ROTATOR CUFF REPAIR     Right  . TRIGGER FINGER RELEASE Left 05/03/2019   Procedure: LEFT LONG AND RING FINGER RELEASE TRIGGER FINGER/A-1 PULLEY;  Surgeon: Leanora Cover, MD;  Location: [place];  Service: Orthopedics;  Laterality: Left;  beir block  . TRIGGER FINGER RELEASE Right 09/23/2019   Procedure: RELEASE TRIGGER FINGER/A-1 PULLEY RIGHT THUMB, LONG AND RING FINGERS;  Surgeon: Leanora Cover, MD;  Location: [place];  Service: Orthopedics;  Laterality: Right;  . TYMPANOSTOMY TUBE PLACEMENT     Past Surgical History:  Procedure Laterality Date  . ABDOMINAL HYSTERECTOMY  2000  . ANTERIOR  CERVICAL DECOMP/DISCECTOMY FUSION  03/19/2012   Procedure: ANTERIOR CERVICAL DECOMPRESSION/DISCECTOMY FUSION 1 LEVEL;  Surgeon: Melina Schools, MD;  Location: [place];  Service: Orthopedics;  Laterality: Left;  Total Disc Replacement C4-5  . ANTERIOR CERVICAL DECOMP/DISCECTOMY FUSION N/A 05/07/2012   Procedure: ANTERIOR CERVICAL DECOMPRESSION/DISCECTOMY FUSION 1 LEVEL/HARDWARE REMOVAL;  Surgeon: Melina Schools, MD;  Location: [place];  Service: Orthopedics;  Laterality: N/A;  REMOVAL OF CERVICAL DISC REPLACEMENT AND ACDF C4-5  . BACK SURGERY    . CARPAL TUNNEL RELEASE Right 09/28/2018   Procedure: CARPAL TUNNEL RELEASE;  Surgeon: Eustace Moore, MD;  Location: [place];  Service: Neurosurgery;  Laterality: Right;  CARPAL TUNNEL RELEASE  . CERVICAL FUSION  05/07/2012  Dr Rolena Infante  . COLONOSCOPY    . CYSTECTOMY     L wrist  . POLYPECTOMY    . ROTATOR CUFF REPAIR     Right  . TRIGGER FINGER RELEASE Left 05/03/2019   Procedure: LEFT LONG AND RING FINGER RELEASE TRIGGER FINGER/A-1 PULLEY;  Surgeon: Leanora Cover, MD;  Location: [place];  Service: Orthopedics;  Laterality: Left;  beir block  . TRIGGER FINGER RELEASE Right 09/23/2019   Procedure: RELEASE TRIGGER FINGER/A-1 PULLEY RIGHT THUMB, LONG AND RING FINGERS;  Surgeon: Leanora Cover, MD;  Location: [place];  Service: Orthopedics;  Laterality: Right;  . TYMPANOSTOMY TUBE PLACEMENT     Past Medical History:  Diagnosis Date  . Bronchitis   . Diabetes mellitus ([place]) 01/19/2019  . Dyspnea   . Heart murmur    resolved "closed by age 22."   . Hyperlipidemia   . Hypertension   . Seasonal allergies    There were no vitals taken for this visit.  Opioid Risk Score:   Fall Risk Score:  `1  Depression screen PHQ 2/9  Depression screen [facility] 2/9 11/16/2019 10/08/2019 08/24/2019 01/25/2019  Decreased Interest 0 0 2 0  Down, Depressed, Hopeless 0 0 2 1  PHQ - 2 Score 0 0 4 1  Altered sleeping - - 3 -  Tired, decreased energy -  - 1 -  Change in appetite - - 1 -  Feeling bad or failure about yourself  - - 1 -  Trouble concentrating - - 1 -  Moving slowly or fidgety/restless - - 3 -  Suicidal thoughts - - 0 -  PHQ-9 Score - - 14 -  Difficult doing work/chores - - Somewhat difficult -  Some recent data might be hidden    Review of Systems  Constitutional: Negative.   HENT: Negative.   Eyes: Negative.   Respiratory: Negative.   Cardiovascular: Negative.   Gastrointestinal: Negative.   Endocrine: Negative.   Genitourinary: Negative.   Musculoskeletal: Positive for arthralgias and back pain.  Skin: Negative.   Allergic/Immunologic: Negative.   Neurological: Positive for weakness.       Tingling   Psychiatric/Behavioral: Negative.   All other systems reviewed and are negative.      Objective:   Physical Exam Gen: no distress, normal appearing HEENT: oral mucosa pink and moist, NCAT Cardio: Reg rate Chest: normal effort, normal rate of breathing Abd: soft, non-distended Ext: no edema Skin: intact Neuro: Alert and oriented x3.  Musculoskeletal: 5/5 strength throughout bilateral lower extremities. Sensation intact. Left hip tenderness to palpation over greater trochanter Psych: pleasant, normal affect     Assessment & Plan:  Alisha Espinoza is a 54 year old woman who presents with left sided low back pain radiating into her leg secondary to her spinal stenosis.  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 -Discussed current symptoms of pain and history of pain.  -Discussed benefits of exercise in reducing pain -Made goal to walk three times per week 1/2 a mile at least and eat 2TB olive oil per day for anti-inflammatory benefits. -ContinueTramadol, only taking 1 per day as needed- discussed weaning off, hopefully by  next visit. -She feels ready to return to work and exercise- walked 1 mile recently.   Left hip pain -reviewed MRI with patient that shows mild degeneration and fraying  of superior labrum -Advised conitnued PT and home exercises -Discussed prolotherapy and advised her to read more about this option.

## 2020-03-08 ENCOUNTER — Telehealth: Payer: Self-pay | Admitting: Family Medicine

## 2020-03-08 NOTE — Telephone Encounter (Signed)
Pt called stating she would like to be cleared for light duty work and would ike a letter written up with some restrictions and she would like a CB when this is ready for pick up  [phone]

## 2020-03-09 ENCOUNTER — Encounter: Payer: Self-pay | Admitting: Family Medicine

## 2020-03-09 NOTE — Telephone Encounter (Signed)
Letter printed.

## 2020-03-09 NOTE — Telephone Encounter (Signed)
I called and advised the patient the letter is ready for pickup.

## 2020-03-09 NOTE — Telephone Encounter (Signed)
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 She is asking for a letter that would limit her lifting to know more than 20 lbs and that says that she should be able to sit to rest as needed. Please advise if this is ok.

## 2020-03-16 ENCOUNTER — Telehealth: Payer: Self-pay | Admitting: Family Medicine

## 2020-03-16 NOTE — Telephone Encounter (Signed)
Alisha Espinoza with the [facility] called wanting to make sure we received the forms they faxed on 03/15/20; if so she would like to have them filled out and faxed back. Stanton Kidney states we should direct any questions to a Ladona Horns CB# [phone] Fax# [phone]

## 2020-03-16 NOTE — Telephone Encounter (Signed)
I called and left a voice mail that the form was received.

## 2020-03-17 ENCOUNTER — Other Ambulatory Visit: Payer: Self-pay | Admitting: Family Medicine

## 2020-03-17 DIAGNOSIS — M25552 Pain in left hip: Secondary | ICD-10-CM

## 2020-03-17 DIAGNOSIS — G8929 Other chronic pain: Secondary | ICD-10-CM

## 2020-03-17 DIAGNOSIS — M5440 Lumbago with sciatica, unspecified side: Secondary | ICD-10-CM

## 2020-03-20 NOTE — Telephone Encounter (Signed)
I called the patient and informed her of the plan as well. She voiced understanding.

## 2020-03-20 NOTE — Telephone Encounter (Signed)
I called and left a voice mail that the patient is being referred for an FCE -- cannot fill out the form until this is done.

## 2020-03-27 ENCOUNTER — Other Ambulatory Visit: Payer: Self-pay | Admitting: Physical Medicine and Rehabilitation

## 2020-04-03 ENCOUNTER — Ambulatory Visit: Payer: Medicaid Other | Admitting: Podiatry

## 2020-04-05 ENCOUNTER — Ambulatory Visit: Payer: Medicaid Other | Attending: Family Medicine

## 2020-04-05 ENCOUNTER — Other Ambulatory Visit: Payer: Self-pay

## 2020-04-05 ENCOUNTER — Other Ambulatory Visit: Payer: Self-pay | Admitting: Physical Medicine and Rehabilitation

## 2020-04-05 DIAGNOSIS — G8929 Other chronic pain: Secondary | ICD-10-CM

## 2020-04-05 DIAGNOSIS — M5442 Lumbago with sciatica, left side: Secondary | ICD-10-CM | POA: Diagnosis present

## 2020-04-05 NOTE — Therapy (Signed)
[facility] [address] [place], [place], [address] Phone: [phone]   Fax:  [phone]  Physical Therapy Evaluation/ FCE  Patient Details  Name: Alisha Espinoza MRN: [id number] Date of Birth: [birth year] Referring Provider (PT): Lavada Mesi   MD   Encounter Date: 04/05/2020   PT End of Session - 04/05/20 1209    Visit Number 1    Number of Visits 1    Authorization Type [employer]    PT Start Time 1202    PT Stop Time 1511    PT Time Calculation (min) 189 min    Activity Tolerance Patient limited by pain    Behavior During Therapy --   elevated pain expressions          Past Medical History:  Diagnosis Date  . Bronchitis   . Diabetes mellitus ([facility]) 01/19/2019  . Dyspnea   . Heart murmur    resolved "closed by age 55."   . Hyperlipidemia   . Hypertension   . Seasonal allergies     Past Surgical History:  Procedure Laterality Date  . ABDOMINAL HYSTERECTOMY  2000  . ANTERIOR CERVICAL DECOMP/DISCECTOMY FUSION  03/19/2012   Procedure: ANTERIOR CERVICAL DECOMPRESSION/DISCECTOMY FUSION 1 LEVEL;  Surgeon: Venita Lick, MD;  [facility];  Service: Orthopedics;  Laterality: Left;  Total Disc Replacement C4-5  . ANTERIOR CERVICAL DECOMP/DISCECTOMY FUSION N/A 05/07/2012   Procedure: ANTERIOR CERVICAL DECOMPRESSION/DISCECTOMY FUSION 1 LEVEL/HARDWARE REMOVAL;  Surgeon: Venita Lick, MD;  [facility];  Service: Orthopedics;  Laterality: N/A;  REMOVAL OF CERVICAL DISC REPLACEMENT AND ACDF C4-5  . BACK SURGERY    . CARPAL TUNNEL RELEASE Right 09/28/2018   Procedure: CARPAL TUNNEL RELEASE;  Surgeon: Tia Alert, MD;  Location: [facility];  Service: Neurosurgery;  Laterality: Right;  CARPAL TUNNEL RELEASE  . CERVICAL FUSION  05/07/2012   Dr Shon Baton  . COLONOSCOPY    . CYSTECTOMY     L wrist  . POLYPECTOMY    . ROTATOR CUFF REPAIR     Right  . TRIGGER FINGER RELEASE Left 05/03/2019   Procedure: LEFT LONG AND RING  FINGER RELEASE TRIGGER FINGER/A-1 PULLEY;  Surgeon: Betha Loa, MD;  Location: [facility];  Service: Orthopedics;  Laterality: Left;  beir block  . TRIGGER FINGER RELEASE Right 09/23/2019   Procedure: RELEASE TRIGGER FINGER/A-1 PULLEY RIGHT THUMB, LONG AND RING FINGERS;  Surgeon: Betha Loa, MD;  Location: [facility];  Service: Orthopedics;  Laterality: Right;  . TYMPANOSTOMY TUBE PLACEMENT      There were no vitals filed for this visit.    Subjective Assessment - 04/05/20 1212    Subjective She reports she tried to return to work but is doing part time.     She wants testing to assess ability to work.              [facility] PT Assessment - 04/05/20 0001      Assessment   Medical Diagnosis Chronic LBP    Referring Provider (PT) Lavada Mesi   MD    Prior Therapy Yes      Precautions   Precautions None      Restrictions   Weight Bearing Restrictions No      Cognition   Overall Cognitive Status Within Functional Limits for tasks assessed                      Objective measurements completed on examination: See above  findings.                            Plan - 04/05/20 1543    Clinical Impression Statement Ms Megee completed the FCE testing rated at , sedentary level though this was the minimal rating  As her max was not attained due to significnat  self limiting behavior.   I suspect she would be pressed to be able to do a sedentary job for 8 hours. She expressed  significant pain levels  but pain her was generally in the 5-6/10 range. She developed a headache during the testing and this may have also limited her.    Personal Factors and Comorbidities Time since onset of injury/illness/exacerbation    PT Next Visit Plan The FCE report will be sent to Dr Junius Roads by fax and the test will be scanned into EPIC.    Consulted and Agree with Plan of Care Patient           Patient will benefit from skilled  therapeutic intervention in order to improve the following deficits and impairments:     Visit Diagnosis: Chronic bilateral low back pain with left-sided sciatica     Problem List Patient Active Problem List   Diagnosis Date Noted  . 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 Hemorrhoids 01/21/2019  . Essential hypertension 01/21/2019  . Serum calcium elevated 01/21/2019  . Elevated cholesterol 01/19/2019  . Diabetes mellitus ([place]) 01/19/2019  . Trigger ring finger of left hand 01/11/2019  . Trigger finger of right thumb 01/11/2019  . 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 Acute bronchitis 08/08/2015  . Leukocytosis 08/08/2015  . Hyperglycemia 08/08/2015  . Tobacco abuse 08/08/2015  . 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 BACTERIAL PNEUMONIA, RIGHT LOWER LOBE 04/27/2008  . LUMP OR MASS IN BREAST 04/27/2008  . COUGH 04/27/2008    Darrel Hoover  PT 04/05/2020, 3:47 PM  [facility] [address] [place], [place], [address] Phone: [phone]   Fax:  [phone]  Name: Alisha Espinoza MRN: [id number] Date of Birth: June 23, 1965

## 2020-04-11 ENCOUNTER — Telehealth: Payer: Self-pay

## 2020-04-11 NOTE — Telephone Encounter (Signed)
I called the patient, advising her the FCE paperwork had been faxed here -- Dr. Junius Roads filled out the form for [facility] and it was faxed to them this morning. I will place a copy of it in the mail to the patient, for her records.

## 2020-04-12 ENCOUNTER — Telehealth: Payer: Self-pay | Admitting: Physical Medicine and Rehabilitation

## 2020-04-12 NOTE — Telephone Encounter (Signed)
Alisha Espinoza the [facility] [employer] called regarding Alisha Espinoza for there Disability. Per the last office note it indicates patient feels ready to return to work, they are wanting to know what restrictions.  The position is Full Time sedentary A fax can be sent with any work restriction to [phone] or call and leave vm on her direct line at [phone]

## 2020-04-13 NOTE — Telephone Encounter (Signed)
I have called and left VM!

## 2020-04-21 ENCOUNTER — Ambulatory Visit: Payer: Medicaid Other | Admitting: Family Medicine

## 2020-04-23 ENCOUNTER — Other Ambulatory Visit: Payer: Self-pay | Admitting: Physical Medicine and Rehabilitation

## 2020-04-25 ENCOUNTER — Encounter: Payer: Self-pay | Admitting: Family Medicine

## 2020-04-25 ENCOUNTER — Ambulatory Visit (INDEPENDENT_AMBULATORY_CARE_PROVIDER_SITE_OTHER): Payer: Medicaid Other | Admitting: Family Medicine

## 2020-04-25 ENCOUNTER — Other Ambulatory Visit: Payer: Self-pay

## 2020-04-25 DIAGNOSIS — M5440 Lumbago with sciatica, unspecified side: Secondary | ICD-10-CM | POA: Diagnosis not present

## 2020-04-25 DIAGNOSIS — G8929 Other chronic pain: Secondary | ICD-10-CM

## 2020-04-25 NOTE — Progress Notes (Signed)
Office Visit Note   Patient: Alisha Espinoza           Date of Birth: 1965/09/21           MRN: [id number] Visit Date: 04/25/2020 Requested by: Drue Flirt, MD [phone] [place],  [place] [address] PCP: Drue Flirt, MD  Subjective: Chief Complaint  Patient presents with  . Lower Back - Follow-up, Pain    Continues to have pain in the middle of the lower back, with pain radiating around the waist bilaterally. Pain in the legs. Her balance feels off somewhat. She wonders if PT may help her.  . Right Little Finger - Pain    Finger is clicking and getting stuck. H/o trigger finger releases in other fingers.    HPI: She is here for follow-up chronic low back pain.  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  She is attempting to work 8-hour shifts but gets very sore after 4 hours if she does not think she can keep this up much longer.  Pain is bilateral lumbosacral area with occasional radiation down into the tailbone.               ROS:   All other systems were reviewed and are negative.  Objective: Vital Signs: There were no vitals taken for this visit.  Physical Exam:  General:  Alert and oriented, in no acute distress. Pulm:  Breathing unlabored. Psy:  Normal mood, congruent affect.  Low back: She is tender near L5-S1 and also near both SI joints.  No pain in the sciatic notch.  Negative straight leg raise, lower extremity strength and reflexes are normal.    Imaging: No results found.  Assessment & Plan: 1.  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 -Discussed options with her and elected to refer her to Dr. Ernestina Patches for diagnostic/therapeutic SI joint injections followed by RF neurotomy if indicated.     Procedures: No procedures performed        PMFS History: Patient Active Problem List   Diagnosis Date Noted  . 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 Hemorrhoids  01/21/2019  . Essential hypertension 01/21/2019  . Serum calcium elevated 01/21/2019  . Elevated cholesterol 01/19/2019  . Diabetes mellitus ([place]) 01/19/2019  . Trigger ring finger of left hand 01/11/2019  . Trigger finger of right thumb 01/11/2019  . 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 Acute bronchitis 08/08/2015  . Leukocytosis 08/08/2015  . Hyperglycemia 08/08/2015  . Tobacco abuse 08/08/2015  . 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 BACTERIAL PNEUMONIA, RIGHT LOWER LOBE 04/27/2008  . LUMP OR MASS IN BREAST 04/27/2008  . COUGH 04/27/2008   Past Medical History:  Diagnosis Date  . Bronchitis   . Diabetes mellitus ([place]) 01/19/2019  . Dyspnea   . Heart murmur    resolved "closed by age 77."   . Hyperlipidemia   . Hypertension   . Seasonal allergies     Family History  Problem Relation Age of Onset  . Diabetes Mother   . Hypertension Father   . Pancreatic cancer Sister   . Colon cancer Neg Hx   . Rectal cancer Neg Hx   . Stomach cancer Neg Hx   . Colon polyps Neg Hx   . Esophageal cancer Neg Hx     Past Surgical History:  Procedure Laterality Date  . ABDOMINAL HYSTERECTOMY  2000  . ANTERIOR CERVICAL  DECOMP/DISCECTOMY FUSION  03/19/2012   Procedure: ANTERIOR CERVICAL DECOMPRESSION/DISCECTOMY FUSION 1 LEVEL;  Surgeon: Melina Schools, MD;  Location: [place];  Service: Orthopedics;  Laterality: Left;  Total Disc Replacement C4-5  . ANTERIOR CERVICAL DECOMP/DISCECTOMY FUSION N/A 05/07/2012   Procedure: ANTERIOR CERVICAL DECOMPRESSION/DISCECTOMY FUSION 1 LEVEL/HARDWARE REMOVAL;  Surgeon: Melina Schools, MD;  Location: [place];  Service: Orthopedics;  Laterality: N/A;  REMOVAL OF CERVICAL DISC REPLACEMENT AND ACDF C4-5  . BACK SURGERY    . CARPAL TUNNEL RELEASE Right 09/28/2018   Procedure: CARPAL TUNNEL RELEASE;  Surgeon: Eustace Moore, MD;  Location: [place];  Service: Neurosurgery;  Laterality: Right;  CARPAL TUNNEL RELEASE  . CERVICAL FUSION  05/07/2012   Dr Rolena Infante  .  COLONOSCOPY    . CYSTECTOMY     L wrist  . POLYPECTOMY    . ROTATOR CUFF REPAIR     Right  . TRIGGER FINGER RELEASE Left 05/03/2019   Procedure: LEFT LONG AND RING FINGER RELEASE TRIGGER FINGER/A-1 PULLEY;  Surgeon: Leanora Cover, MD;  Location: [place];  Service: Orthopedics;  Laterality: Left;  beir block  . TRIGGER FINGER RELEASE Right 09/23/2019   Procedure: RELEASE TRIGGER FINGER/A-1 PULLEY RIGHT THUMB, LONG AND RING FINGERS;  Surgeon: Leanora Cover, MD;  Location: [place];  Service: Orthopedics;  Laterality: Right;  . TYMPANOSTOMY TUBE PLACEMENT     Social History   Occupational History  . Not on file  Tobacco Use  . Smoking status: Current Every Day Smoker    Packs/day: 0.50    Years: 30.00    Pack years: 15.00    Types: Cigarettes  . Smokeless tobacco: Never Used  . Tobacco comment: 10cigs/day  Vaping Use  . Vaping Use: Never used  Substance and Sexual Activity  . Alcohol use: Not Currently    Comment: occasionally  . Drug use: No  . Sexual activity: Yes    Birth control/protection: Surgical    Comment: Hysterectomy

## 2020-05-05 ENCOUNTER — Other Ambulatory Visit: Payer: Self-pay | Admitting: Physical Medicine and Rehabilitation

## 2020-05-15 ENCOUNTER — Ambulatory Visit (INDEPENDENT_AMBULATORY_CARE_PROVIDER_SITE_OTHER): Payer: Medicaid Other | Admitting: Physical Medicine and Rehabilitation

## 2020-05-15 ENCOUNTER — Other Ambulatory Visit: Payer: Self-pay

## 2020-05-15 ENCOUNTER — Ambulatory Visit: Payer: Self-pay

## 2020-05-15 DIAGNOSIS — M461 Sacroiliitis, not elsewhere classified: Secondary | ICD-10-CM | POA: Diagnosis not present

## 2020-05-15 NOTE — Progress Notes (Signed)
Numeric Pain Rating Scale and Functional Assessment Average Pain 7   In the last MONTH (on 0-10 scale) has pain interfered with the following?  1. General activity like being  able to carry out your everyday physical activities such as walking, climbing stairs, carrying groceries, or moving a chair?  Rating(5) Lower back pain with left leg radiculopathy. Constant pain.  +Driver, -BT, -Dye Allergies.

## 2020-05-15 NOTE — Progress Notes (Unsigned)
[name] - 55 y.o. female MRN [id number]  Date of birth: 1965/09/23  Office Visit Note: Visit Date: 05/15/2020 PCP: Drue Flirt, MD Referred by: Drue Flirt, MD  Subjective: Chief Complaint  Patient presents with  . Lower Back - Pain   HPI:  Alisha Espinoza is a 55 y.o. female who comes in today at the request of Dr. Eunice Blase for planned Bilateral anesthetic sacroiliac joint arthrogram with fluoroscopic guidance.  The patient has failed conservative care including home exercise, medications, time and activity modification.  This injection will be diagnostic and hopefully therapeutic.  Please see requesting physician notes for further details and justification.   ROS Otherwise per HPI.  Assessment & Plan: Visit Diagnoses:    ICD-10-CM   1. Sacroiliitis (HCC)  M46.1 Sacroiliac Joint Inj    XR C-ARM NO REPORT    Plan: No additional findings.   Meds & Orders: No orders of the defined types were placed in this encounter.   Orders Placed This Encounter  Procedures  . Sacroiliac Joint Inj  . XR C-ARM NO REPORT    Follow-up: Return if symptoms worsen or fail to improve.   Procedures: Sacroiliac Joint Inj on 05/15/2020 3:03 PM Indications: pain and diagnostic evaluation Details: 22 G 3.5 in needle, fluoroscopy-guided posterior approach Medications (Right): 40 mg triamcinolone acetonide 40 MG/ML; 2 mL bupivacaine 0.5 % Medications (Left): 40 mg triamcinolone acetonide 40 MG/ML; 2 mL bupivacaine 0.5 % Outcome: tolerated well, no immediate complications  There was excellent flow of contrast producing a partial arthrogram of the sacroiliac joint.  Procedure, treatment alternatives, risks and benefits explained, specific risks discussed. Consent was given by the patient. Immediately prior to procedure a time out was called to verify the correct patient, procedure, equipment, support staff and site/side marked as required. Patient was prepped and draped in the  usual sterile fashion.          Clinical History: No specialty comments available.     Objective:  VS:  HT:    WT:   BMI:     BP:   HR: bpm  TEMP: ( )  RESP:  Physical Exam Vitals and nursing note reviewed.  Constitutional:      General: She is not in acute distress.    Appearance: Normal appearance. She is not ill-appearing.  HENT:     Head: Normocephalic and atraumatic.     Right Ear: External ear normal.     Left Ear: External ear normal.  Eyes:     Extraocular Movements: Extraocular movements intact.  Cardiovascular:     Rate and Rhythm: Normal rate.     Pulses: Normal pulses.  Pulmonary:     Effort: Pulmonary effort is normal. No respiratory distress.  Abdominal:     General: There is no distension.     Palpations: Abdomen is soft.  Musculoskeletal:        General: Tenderness present.     Cervical back: Neck supple.     Right lower leg: No edema.     Left lower leg: No edema.     Comments: Patient has good distal strength with no pain over the greater trochanters.  No clonus or focal weakness.  Positive pain over the PSIS with positive Fortin finger sign bilaterally.  Equivocal Patrick's.  Skin:    Findings: No erythema, lesion or rash.  Neurological:     General: No focal deficit present.     Mental Status: She is alert and  oriented to person, place, and time.     Sensory: No sensory deficit.     Motor: No weakness or abnormal muscle tone.     Coordination: Coordination normal.  Psychiatric:        Mood and Affect: Mood normal.        Behavior: Behavior normal.      Imaging: No results found.

## 2020-05-18 ENCOUNTER — Ambulatory Visit (INDEPENDENT_AMBULATORY_CARE_PROVIDER_SITE_OTHER): Payer: Medicaid Other | Admitting: Podiatry

## 2020-05-18 ENCOUNTER — Other Ambulatory Visit: Payer: Self-pay

## 2020-05-18 ENCOUNTER — Ambulatory Visit (INDEPENDENT_AMBULATORY_CARE_PROVIDER_SITE_OTHER): Payer: Medicaid Other

## 2020-05-18 DIAGNOSIS — M2042 Other hammer toe(s) (acquired), left foot: Secondary | ICD-10-CM | POA: Diagnosis not present

## 2020-05-18 DIAGNOSIS — M79672 Pain in left foot: Secondary | ICD-10-CM

## 2020-05-18 DIAGNOSIS — L989 Disorder of the skin and subcutaneous tissue, unspecified: Secondary | ICD-10-CM

## 2020-05-18 NOTE — Patient Instructions (Signed)

## 2020-05-22 ENCOUNTER — Telehealth: Payer: Self-pay | Admitting: Family Medicine

## 2020-05-22 ENCOUNTER — Other Ambulatory Visit: Payer: Self-pay | Admitting: Physical Medicine and Rehabilitation

## 2020-05-22 NOTE — Telephone Encounter (Signed)
I called the patient. We had a cancellation for tomorrow at 11:00 on Dr. Junius Roads' schedule --- we will see her then.

## 2020-05-22 NOTE — Telephone Encounter (Signed)
Pt states that her leg is in a lot of pain and would like to know if she could be squeezed in today or tomorrow! CB [phone]

## 2020-05-22 NOTE — Progress Notes (Signed)
Subjective: 55 year old female presents the office today for second opinion given chronic pain a callus to her left foot on the plantar aspect of foot as well as spur on the fifth toe.  The toe is curled which causes discomfort as it is causing pressure.  She has had the callus trimmed she has tried offloading pads but any improvement to continue to cause discomfort.  She was discussed other treatment options at this point. Denies any systemic complaints such as fevers, chills, nausea, vomiting. No acute changes since last appointment, and no other complaints at this time.   Objective: AAO x3, NAD DP/PT pulses palpable bilaterally, CRT less than 3 seconds Adductovarus is present in the left fifth toe resulting in hyperkeratotic lesion at the lateral aspect of the fifth digit toe distally.  No ulcerations identified there is no edema or erythema.  Hyperkeratotic tissue present submetatarsal area but does not directly correlate on metatarsal head plantarly.  Possible verruca versus hyperkeratotic lesion.  Tenderness palpation of the area.  MMT 5/5.  No pain with calf compression, swelling, warmth, erythema  Assessment: Symptomatic adductovarus left fifth toe, skin lesion left foot  Plan: -All treatment options discussed with the patient including all alternatives, risks, complications.  -X-rays obtained reviewed.  No evidence of acute fracture, calcifications.  Adductovarus is present. -We discussed with conservative as well surgical treatment options.  Was proceed with surgical intervention.  She is attempted numerous conservative treatments with any resolution.  I discussed with her fifth digit PIPJ arthroplasty with exostectomy as well as excision of soft tissue mass plantar aspect the foot.  She wishes to proceed with this. -The incision placement as well as the postoperative course was discussed with the patient. I discussed risks of the surgery which include, but not limited to, infection,  bleeding, pain, swelling, need for further surgery, delayed or nonhealing, painful or ugly scar, numbness or sensation changes, over/under correction, recurrence, transfer lesions, further deformity, hardware failure, DVT/PE, loss of toe/foot. Patient understands these risks and wishes to proceed with surgery. The surgical consent was reviewed with the patient all 3 pages were signed. No promises or guarantees were given to the outcome of the procedure. All questions were answered to the best of my ability. Before the surgery the patient was encouraged to call the office if there is any further questions. The surgery will be performed at the [facility] on an outpatient basis. -Patient encouraged to call the office with any questions, concerns, change in symptoms.   Trula Slade DPM

## 2020-05-23 ENCOUNTER — Encounter: Payer: Self-pay | Admitting: Family Medicine

## 2020-05-23 ENCOUNTER — Other Ambulatory Visit: Payer: Self-pay

## 2020-05-23 ENCOUNTER — Ambulatory Visit (INDEPENDENT_AMBULATORY_CARE_PROVIDER_SITE_OTHER): Payer: Medicaid Other | Admitting: Family Medicine

## 2020-05-23 ENCOUNTER — Telehealth: Payer: Self-pay | Admitting: Physical Medicine and Rehabilitation

## 2020-05-23 DIAGNOSIS — M5442 Lumbago with sciatica, left side: Secondary | ICD-10-CM | POA: Diagnosis not present

## 2020-05-23 NOTE — Progress Notes (Signed)
Office Visit Note   Patient: Alisha Espinoza           Date of Birth: 05-18-1965           MRN: [id number] Visit Date: 05/23/2020 Requested by: Drue Flirt, MD [phone],  [place] [address] PCP: Drue Flirt, MD  Subjective: Chief Complaint  Patient presents with  . Lower Back - Pain    Pain down the back of the left leg. Has had this pain since around 05/18/20, 3 days after an injection from Dr. Ernestina Patches.    HPI: She is here with worsening low back and left leg pain.  Symptoms started about 3 days after getting SI joint injection.  Severe pain down the back of her left leg into the calf.  She has been unable to work because of her pain.  No bowel or bladder dysfunction.  Her chronic pain meds are not touching this pain.              ROS:   All other systems were reviewed and are negative.  Objective: Vital Signs: There were no vitals taken for this visit.  Physical Exam:  General:  Alert and oriented, in no acute distress. Pulm:  Breathing unlabored. Psy:  Normal mood, congruent affect. Skin: No rash or erythema in the lower back. Low back: She is tender to palpation near the SI joints and in the left sciatic notch.  No pain with internal hip rotation but she does have pain with straight leg raise on the left.  Lower extremity strength and reflexes remain normal.  Imaging: No results found.  Assessment & Plan: 1.  Worsening low back pain with left-sided sciatica, question new disc protrusion. -We will proceed with lumbar MRI scan.  Out of work until we get results.     Procedures: No procedures performed        PMFS History: Patient Active Problem List   Diagnosis Date Noted  . 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 Hemorrhoids 01/21/2019  . Essential hypertension 01/21/2019  . Serum calcium elevated 01/21/2019  . Elevated cholesterol 01/19/2019  . Diabetes mellitus ([place]) 01/19/2019  . Trigger ring finger of left hand 01/11/2019  . Trigger finger  of right thumb 01/11/2019  . 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 Acute bronchitis 08/08/2015  . Leukocytosis 08/08/2015  . Hyperglycemia 08/08/2015  . Tobacco abuse 08/08/2015  . 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 BACTERIAL PNEUMONIA, RIGHT LOWER LOBE 04/27/2008  . LUMP OR MASS IN BREAST 04/27/2008  . COUGH 04/27/2008   Past Medical History:  Diagnosis Date  . Bronchitis   . Diabetes mellitus ([place]) 01/19/2019  . Dyspnea   . Heart murmur    resolved "closed by age 55."   . Hyperlipidemia   . Hypertension   . Seasonal allergies     Family History  Problem Relation Age of Onset  . Diabetes Mother   . Hypertension Father   . Pancreatic cancer Sister   . Colon cancer Neg Hx   . Rectal cancer Neg Hx   . Stomach cancer Neg Hx   . Colon polyps Neg Hx   . Esophageal cancer Neg Hx     Past Surgical History:  Procedure Laterality Date  . ABDOMINAL HYSTERECTOMY  2000  . ANTERIOR CERVICAL DECOMP/DISCECTOMY FUSION  03/19/2012   Procedure: ANTERIOR CERVICAL DECOMPRESSION/DISCECTOMY FUSION 1 LEVEL;  Surgeon: Melina Schools, MD;  Location: [place];  Service: Orthopedics;  Laterality: Left;  Total Disc  Replacement C4-5  . ANTERIOR CERVICAL DECOMP/DISCECTOMY FUSION N/A 05/07/2012   Procedure: ANTERIOR CERVICAL DECOMPRESSION/DISCECTOMY FUSION 1 LEVEL/HARDWARE REMOVAL;  Surgeon: Melina Schools, MD;  Location: [place];  Service: Orthopedics;  Laterality: N/A;  REMOVAL OF CERVICAL DISC REPLACEMENT AND ACDF C4-5  . BACK SURGERY    . CARPAL TUNNEL RELEASE Right 09/28/2018   Procedure: CARPAL TUNNEL RELEASE;  Surgeon: Eustace Moore, MD;  Location: [place];  Service: Neurosurgery;  Laterality: Right;  CARPAL TUNNEL RELEASE  . CERVICAL FUSION  05/07/2012   Dr Rolena Infante  . COLONOSCOPY    . CYSTECTOMY     L wrist  . POLYPECTOMY    . ROTATOR CUFF REPAIR     Right  . TRIGGER FINGER RELEASE Left 05/03/2019   Procedure: LEFT LONG AND RING FINGER RELEASE TRIGGER FINGER/A-1 PULLEY;  Surgeon: Leanora Cover, MD;  Location: [place];  Service: Orthopedics;  Laterality: Left;  beir block  . TRIGGER FINGER RELEASE Right 09/23/2019   Procedure: RELEASE TRIGGER FINGER/A-1 PULLEY RIGHT THUMB, LONG AND RING FINGERS;  Surgeon: Leanora Cover, MD;  Location: [place];  Service: Orthopedics;  Laterality: Right;  . TYMPANOSTOMY TUBE PLACEMENT     Social History   Occupational History  . Not on file  Tobacco Use  . Smoking status: Current Every Day Smoker    Packs/day: 0.50    Years: 30.00    Pack years: 15.00    Types: Cigarettes  . Smokeless tobacco: Never Used  . Tobacco comment: 10cigs/day  Vaping Use  . Vaping Use: Never used  Substance and Sexual Activity  . Alcohol use: Not Currently    Comment: occasionally  . Drug use: No  . Sexual activity: Yes    Birth control/protection: Surgical    Comment: Hysterectomy

## 2020-05-23 NOTE — Telephone Encounter (Signed)
Can you please put her on the waitlist for an earlier appointment if possible to discuss this? I see she is already scheduled for 3/3

## 2020-05-23 NOTE — Telephone Encounter (Signed)
Orthopedics has taken her out of work til the MRI is complete, she is at a pain level of 8.  Would like to know if there is something stronger.

## 2020-05-24 ENCOUNTER — Encounter: Payer: Self-pay | Admitting: Physical Medicine and Rehabilitation

## 2020-05-24 ENCOUNTER — Encounter
Payer: Medicaid Other | Attending: Physical Medicine and Rehabilitation | Admitting: Physical Medicine and Rehabilitation

## 2020-05-24 ENCOUNTER — Other Ambulatory Visit: Payer: Self-pay

## 2020-05-24 VITALS — BP 125/83 | HR 81 | Temp 98.8°F | Ht 66.0 in | Wt 204.0 lb

## 2020-05-24 DIAGNOSIS — G4701 Insomnia due to medical condition: Secondary | ICD-10-CM | POA: Insufficient documentation

## 2020-05-24 DIAGNOSIS — M48062 Spinal stenosis, lumbar region with neurogenic claudication: Secondary | ICD-10-CM

## 2020-05-24 MED ORDER — AMITRIPTYLINE HCL 10 MG PO TABS: 10.0000 mg | ORAL_TABLET | Freq: Every day | ORAL | 1 refills | Status: DC

## 2020-05-24 MED ORDER — HYDROCODONE-ACETAMINOPHEN 5-325 MG PO TABS: 1.0000 | ORAL_TABLET | Freq: Two times a day (BID) | ORAL | 0 refills | Status: DC

## 2020-05-24 NOTE — Progress Notes (Signed)
Subjective:    Patient ID: Alisha Espinoza, female    DOB: September 13, 1965, 55 y.o.   MRN: [id number]  HPI  Alisha Espinoza is a 55 year old woman s/p multiple spinal surgeries, who presents for follow-up of left sided lower back pain radiating into the left leg.  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 Ernestina Patches. He has ordered an MRI  -She gets hives to Meloxicam -Gabapentin makes her eyes cross- she does take 400mg  at night and it puts her to sleep.  -The Tramadol has stopped working -She asks for something stronger for pain.  -Her goal is to be able to be out of pain to shower and function.  2) Insomnia: -she only sleeps 3 hours at night, but does take naps during the day.  -She is willing to try Amitriptyline 10mg .   Prior history: Her pain comes and goes and she feels ready to return to walking. She did a mile before her foot procedure and felt good. Her current pain is worse in her bilateral groin.   She feels ready to return to work.   She lose 2 lbs since last visit.  Has been using Tramadol as needed. She is using Tramadol approximately once per day. She is tired of taking medications, getting shots, getting surgery. She does not need refill at this time.   Prior history: Current medications: Tramadol 50mg  2-3 time per day.  She takes the Ibuprofen once per day. She has been eating with this. It does not bother her belly. She has been taking 800mg  once per day.  These are both helping.  Her car broke down so she has not been waling as much recently. She was able to do half a mile. She walked 1 mile one day after she got the steroid injection.   The pain has started to come back a little bit after her injection.   MRI lumbar spine reviewed with patient as follows: 1. Broad-based disc protrusion and bilateral facet hypertrophy at L3-4 results in severe right and moderate left subarticular narrowing. Moderate  foraminal stenosis is present bilaterally. 2. Large right lateral disc protrusion at L4-5 with severe right and mild left subarticular narrowing. 3. Moderate right and mild left foraminal narrowing at L4-5. 4. Leftward disc protrusion at L5-S1 extends into the left neural foramen without significant stenosis. 5. Broad-based disc protrusion and mild bilateral facet hypertrophy at L2-3 with mild subarticular and foraminal narrowing bilaterally.   Pain Inventory Average Pain 8 Pain Right Now 8 My pain is sharp, stabbing and aching  In the last 24 hours, has pain interfered with the following? General activity 10 Relation with others 10 Enjoyment of life 10 What TIME of day is your pain at its worst? morning , daytime, evening and night Sleep (in general) Poor  Pain is worse with: walking, sitting and some activites Pain improves with: nothing Relief from Meds: 0  Family History  Problem Relation Age of Onset  . Diabetes Mother   . Hypertension Father   . Pancreatic cancer Sister   . Colon cancer Neg Hx   . Rectal cancer Neg Hx   . Stomach cancer Neg Hx   . Colon polyps Neg Hx   . Esophageal cancer Neg Hx    Social History   Socioeconomic History  . Marital status: Divorced    Spouse name: Not on file  . Number of children: Not on file  . Years  of education: Not on file  . Highest education level: Not on file  Occupational History  . Not on file  Tobacco Use  . Smoking status: Current Every Day Smoker    Packs/day: 0.50    Years: 30.00    Pack years: 15.00    Types: Cigarettes  . Smokeless tobacco: Never Used  . Tobacco comment: 10cigs/day  Vaping Use  . Vaping Use: Never used  Substance and Sexual Activity  . Alcohol use: Not Currently    Comment: occasionally  . Drug use: No  . Sexual activity: Yes    Birth control/protection: Surgical    Comment: Hysterectomy  Other Topics Concern  . Not on file  Social History Narrative  . Not on file   Social  Determinants of Health   Financial Resource Strain: Not on file  Food Insecurity: Not on file  Transportation Needs: Not on file  Physical Activity: Not on file  Stress: Not on file  Social Connections: Not on file   Past Surgical History:  Procedure Laterality Date  . ABDOMINAL HYSTERECTOMY  2000  . ANTERIOR CERVICAL DECOMP/DISCECTOMY FUSION  03/19/2012   Procedure: ANTERIOR CERVICAL DECOMPRESSION/DISCECTOMY FUSION 1 LEVEL;  Surgeon: Melina Schools, MD;  Location: [place];  Service: Orthopedics;  Laterality: Left;  Total Disc Replacement C4-5  . ANTERIOR CERVICAL DECOMP/DISCECTOMY FUSION N/A 05/07/2012   Procedure: ANTERIOR CERVICAL DECOMPRESSION/DISCECTOMY FUSION 1 LEVEL/HARDWARE REMOVAL;  Surgeon: Melina Schools, MD;  Location: [facility];  Service: Orthopedics;  Laterality: N/A;  REMOVAL OF CERVICAL DISC REPLACEMENT AND ACDF C4-5  . BACK SURGERY    . CARPAL TUNNEL RELEASE Right 09/28/2018   Procedure: CARPAL TUNNEL RELEASE;  Surgeon: Eustace Moore, MD;  Location: [place];  Service: Neurosurgery;  Laterality: Right;  CARPAL TUNNEL RELEASE  . CERVICAL FUSION  05/07/2012   Dr Rolena Infante  . COLONOSCOPY    . CYSTECTOMY     L wrist  . POLYPECTOMY    . ROTATOR CUFF REPAIR     Right  . TRIGGER FINGER RELEASE Left 05/03/2019   Procedure: LEFT LONG AND RING FINGER RELEASE TRIGGER FINGER/A-1 PULLEY;  Surgeon: Leanora Cover, MD;  Location: [place];  Service: Orthopedics;  Laterality: Left;  beir block  . TRIGGER FINGER RELEASE Right 09/23/2019   Procedure: RELEASE TRIGGER FINGER/A-1 PULLEY RIGHT THUMB, LONG AND RING FINGERS;  Surgeon: Leanora Cover, MD;  Location: [facility];  Service: Orthopedics;  Laterality: Right;  . TYMPANOSTOMY TUBE PLACEMENT     Past Surgical History:  Procedure Laterality Date  . ABDOMINAL HYSTERECTOMY  2000  . ANTERIOR CERVICAL DECOMP/DISCECTOMY FUSION  03/19/2012   Procedure: ANTERIOR CERVICAL DECOMPRESSION/DISCECTOMY FUSION 1 LEVEL;  Surgeon: Melina Schools, MD;  Location: [facility];  Service: Orthopedics;  Laterality: Left;  Total Disc Replacement C4-5  . ANTERIOR CERVICAL DECOMP/DISCECTOMY FUSION N/A 05/07/2012   Procedure: ANTERIOR CERVICAL DECOMPRESSION/DISCECTOMY FUSION 1 LEVEL/HARDWARE REMOVAL;  Surgeon: Melina Schools, MD;  Location: [facility];  Service: Orthopedics;  Laterality: N/A;  REMOVAL OF CERVICAL DISC REPLACEMENT AND ACDF C4-5  . BACK SURGERY    . CARPAL TUNNEL RELEASE Right 09/28/2018   Procedure: CARPAL TUNNEL RELEASE;  Surgeon: Eustace Moore, MD;  Location: [place];  Service: Neurosurgery;  Laterality: Right;  CARPAL TUNNEL RELEASE  . CERVICAL FUSION  05/07/2012   Dr Rolena Infante  . COLONOSCOPY    . CYSTECTOMY     L wrist  . POLYPECTOMY    . ROTATOR CUFF REPAIR     Right  .  TRIGGER FINGER RELEASE Left 05/03/2019   Procedure: LEFT LONG AND RING FINGER RELEASE TRIGGER FINGER/A-1 PULLEY;  Surgeon: Leanora Cover, MD;  Location: [place];  Service: Orthopedics;  Laterality: Left;  beir block  . TRIGGER FINGER RELEASE Right 09/23/2019   Procedure: RELEASE TRIGGER FINGER/A-1 PULLEY RIGHT THUMB, LONG AND RING FINGERS;  Surgeon: Leanora Cover, MD;  Location: [place];  Service: Orthopedics;  Laterality: Right;  . TYMPANOSTOMY TUBE PLACEMENT     Past Medical History:  Diagnosis Date  . Bronchitis   . Diabetes mellitus ([place]) 01/19/2019  . Dyspnea   . Heart murmur    resolved "closed by age 74."   . Hyperlipidemia   . Hypertension   . Seasonal allergies    There were no vitals taken for this visit.  Opioid Risk Score:   Fall Risk Score:  `1  Depression screen PHQ 2/9  Depression screen [facility] 2/9 03/02/2020 11/16/2019 10/08/2019 08/24/2019 01/25/2019  Decreased Interest 0 0 0 2 0  Down, Depressed, Hopeless 0 0 0 2 1  PHQ - 2 Score 0 0 0 4 1  Altered sleeping - - - 3 -  Tired, decreased energy - - - 1 -  Change in appetite - - - 1 -  Feeling bad or failure about yourself  - - - 1 -  Trouble concentrating - -  - 1 -  Moving slowly or fidgety/restless - - - 3 -  Suicidal thoughts - - - 0 -  PHQ-9 Score - - - 14 -  Difficult doing work/chores - - - Somewhat difficult -  Some recent data might be hidden    Review of Systems  Constitutional: Negative.   HENT: Negative.   Eyes: Negative.   Respiratory: Negative.   Cardiovascular: Negative.   Gastrointestinal: Negative.   Endocrine: Negative.   Genitourinary: Negative.   Musculoskeletal: Positive for arthralgias, back pain and gait problem.  Skin: Negative.   Allergic/Immunologic: Negative.   Neurological: Positive for weakness.       Tingling   Psychiatric/Behavioral: Negative.   All other systems reviewed and are negative.      Objective:   Physical Exam Gen: no distress, normal appearing HEENT: oral mucosa pink and moist, NCAT Cardio: Reg rate Chest: normal effort, normal rate of breathing Abd: soft, non-distended Ext: no edema Psych: pleasant, normal affect Skin: intact Neuro: Alert and oriented x3.  Musculoskeletal: 5/5 strength throughout bilateral lower extremities. Sensation intact. Left hip tenderness to palpation over greater trochanter Psych: pleasant, normal affect     Assessment & Plan:  Alisha Espinoza is a 55 year old woman who presents with left sided low back pain radiating into her leg secondary to her spinal stenosis.  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  -Reviewed MRI lumbar spine results with patient that shows left sided foraminal stenosis, severe at L3-L4 and facet arthropathy. -Discussed current symptoms of pain and history of pain.  -Discussed benefits of exercise in reducing pain -Made goal to walk three times per week 1/2 a mile at least and eat 2TB olive oil per day for anti-inflammatory benefits. -Stop Tramadol and Gabapentin -Start Norco 5mg  BID PRN (MME 10). Advised  regarding side effects and this is not intended for chronic treatment. She is agreeable.  -She is no longer ready to return to work.   -Discussed current symptoms of pain and history of  pain.  -Discussed benefits of exercise in reducing pain. -Discussed following foods that may reduce pain: 1) Ginger 2) Blueberries 3) Salmon 4) Pumpkin seeds 5) dark chocolate 6) turmeric 7) tart cherries 8) virgin olive oil 9) chilli peppers 10) mint 11) red wine 12) garlic  Link to further information on diet for chronic pain: http://www.randall.com/   Left hip pain -reviewed MRI with patient that shows mild degeneration and fraying of superior labrum -Advised conitnued PT and home exercises -Discussed prolotherapy and advised her to read more about this option.  Insomnia: -discussed trying Amitriptyline 10mg  at night and she is agreeable -Discussed that this medication can also help with neuropathic pain.

## 2020-05-24 NOTE — Patient Instructions (Signed)
-  Discussed following foods that may reduce pain: 1) Ginger 2) Blueberries 3) Salmon 4) Pumpkin seeds 5) dark chocolate 6) turmeric 7) tart cherries 8) virgin olive oil 9) chilli peppers 10) mint 11) red wine 12) garlic  Link to further information on diet for chronic pain: https://[email or web address]/[email or web address]/diet-patients-[email or web address]-pain  

## 2020-05-25 ENCOUNTER — Encounter: Payer: Self-pay | Admitting: *Deleted

## 2020-05-25 DIAGNOSIS — M545 Low back pain, unspecified: Secondary | ICD-10-CM | POA: Insufficient documentation

## 2020-05-31 ENCOUNTER — Ambulatory Visit: Payer: Medicaid Other | Admitting: Physical Medicine and Rehabilitation

## 2020-05-31 ENCOUNTER — Telehealth: Payer: Self-pay | Admitting: Urology

## 2020-05-31 NOTE — Telephone Encounter (Signed)
DOS: 06/07/20   Hammer toe repair 5th left -16435 Exc Benign lesion left- 11421  Received auth fax from [employer]. Cpt [id number], [id number] approved, auth # [id number] good from 06/07/20-08/06/20

## 2020-06-01 ENCOUNTER — Ambulatory Visit: Payer: Medicaid Other | Admitting: Physical Medicine and Rehabilitation

## 2020-06-06 ENCOUNTER — Other Ambulatory Visit: Payer: Self-pay

## 2020-06-06 ENCOUNTER — Telehealth: Payer: Self-pay

## 2020-06-06 ENCOUNTER — Emergency Department (HOSPITAL_COMMUNITY): Payer: Medicaid Other

## 2020-06-06 ENCOUNTER — Emergency Department (HOSPITAL_COMMUNITY)
Admission: EM | Admit: 2020-06-06 | Discharge: 2020-06-06 | Disposition: A | Discharge status: Home / Self Care | Payer: Medicaid Other | Attending: Emergency Medicine | Admitting: Emergency Medicine

## 2020-06-06 ENCOUNTER — Encounter (HOSPITAL_COMMUNITY): Payer: Self-pay

## 2020-06-06 ENCOUNTER — Ambulatory Visit
Admission: RE | Admit: 2020-06-06 | Discharge: 2020-06-06 | Disposition: A | Discharge status: Home / Self Care | Payer: Medicaid Other | Source: Ambulatory Visit | Attending: Family Medicine | Admitting: Family Medicine

## 2020-06-06 DIAGNOSIS — R059 Cough, unspecified: Secondary | ICD-10-CM | POA: Diagnosis present

## 2020-06-06 DIAGNOSIS — Z5321 Procedure and treatment not carried out due to patient leaving prior to being seen by health care provider: Secondary | ICD-10-CM | POA: Insufficient documentation

## 2020-06-06 DIAGNOSIS — M5442 Lumbago with sciatica, left side: Secondary | ICD-10-CM

## 2020-06-06 NOTE — Telephone Encounter (Signed)
Alisha Espinoza call to cancel her surgery with Dr. Jacqualyn Posey on 06/07/2020. She is sick, she stated she will call back to reschedule. Notified Dr. Jacqualyn Posey and Caren Griffins with [facility]

## 2020-06-06 NOTE — ED Notes (Signed)
Called 3x for room placement. Eloped from waiting area.  

## 2020-06-06 NOTE — ED Triage Notes (Signed)
Pt sts cough x 1 week. Dx with bronchitis. Took prednisone cycle. Doesn't feel better.

## 2020-06-07 ENCOUNTER — Telehealth: Payer: Self-pay | Admitting: Family Medicine

## 2020-06-07 NOTE — Telephone Encounter (Signed)
Patient called needing the results of her MRI. Patient had MRI yesterday. The number to contact patient is [phone]

## 2020-06-07 NOTE — Telephone Encounter (Signed)
I called and left a voice mail -- still awaiting the results to be released to your chart. They will likely be sent to Dr. Junius Roads tomorrow and she will receive a call once he has reviewed them.

## 2020-06-08 MED ORDER — BUPIVACAINE HCL 0.5 % IJ SOLN
2.0000 mL | INTRAMUSCULAR | Status: AC | PRN
  Administered 2020-05-15: 2 mL via INTRA_ARTICULAR

## 2020-06-08 MED ORDER — TRIAMCINOLONE ACETONIDE 40 MG/ML IJ SUSP
40.0000 mg | INTRAMUSCULAR | Status: AC | PRN
  Administered 2020-05-15: 40 mg via INTRA_ARTICULAR

## 2020-06-09 ENCOUNTER — Encounter: Payer: Self-pay | Admitting: Physical Medicine and Rehabilitation

## 2020-06-09 ENCOUNTER — Other Ambulatory Visit: Payer: Self-pay | Admitting: Physical Medicine and Rehabilitation

## 2020-06-12 ENCOUNTER — Telehealth: Payer: Self-pay | Admitting: Family Medicine

## 2020-06-12 ENCOUNTER — Encounter: Payer: Medicaid Other | Admitting: Podiatry

## 2020-06-12 NOTE — Telephone Encounter (Signed)
I called and advised the patient that there was not enough of the MRI done to read. She said she had a cough - had gone to the ED with a COPD exacerbation on 3/07 - that is why she could not lie still. Advised her to call [facility] back to schedule a new appointment for the MRI. She said she will call them tomorrow.

## 2020-06-12 NOTE — Telephone Encounter (Signed)
Received medical records release form and disability paperwork from patient. Patient advised she spoke with [employer] and was told she didn't have to pay the $25.00 fee    Fording to [facility] today

## 2020-06-12 NOTE — Telephone Encounter (Signed)
Spoke with patient she advised that she had her MRI 06/06/2020 and did not get the results yet. Patient asked if Dr Junius Roads could call her with the results of the MRI   The number  To contact patient is [phone]

## 2020-06-12 NOTE — Telephone Encounter (Signed)
The patient says she had an Lsp MRI at [facility] on 06/06/20, but there are no results in the chart. The patient says they had some kind of problem with the MRI and "only got one good view." Would you mind calling [facility] to check on this?

## 2020-06-12 NOTE — Telephone Encounter (Signed)
[facility] states that they had to cancel the MRI because the patient was moving too much due to pain.

## 2020-06-14 ENCOUNTER — Telehealth: Payer: Self-pay | Admitting: Physical Medicine and Rehabilitation

## 2020-06-14 NOTE — Telephone Encounter (Signed)
Spoke to patient and relayed the message sent from Dr Ranell Patrick.

## 2020-06-14 NOTE — Telephone Encounter (Signed)
Please let her know we can increase pain medications with next refill but are unable to provide early refills- in the interim she can take up to six tylenol 650mg  or three Ibuprofen 800mg  per day

## 2020-06-14 NOTE — Telephone Encounter (Signed)
Patient is having a lot of pain, and would like an increase in pain meds

## 2020-06-15 ENCOUNTER — Other Ambulatory Visit: Payer: Self-pay

## 2020-06-15 ENCOUNTER — Ambulatory Visit
Admission: RE | Admit: 2020-06-15 | Discharge: 2020-06-15 | Disposition: A | Discharge status: Home / Self Care | Payer: Medicaid Other | Source: Ambulatory Visit | Attending: Family Medicine | Admitting: Family Medicine

## 2020-06-15 ENCOUNTER — Encounter: Payer: Self-pay | Admitting: Family Medicine

## 2020-06-16 ENCOUNTER — Other Ambulatory Visit: Payer: Self-pay

## 2020-06-16 ENCOUNTER — Encounter
Payer: Medicaid Other | Attending: Physical Medicine and Rehabilitation | Admitting: Physical Medicine and Rehabilitation

## 2020-06-16 ENCOUNTER — Telehealth: Payer: Self-pay | Admitting: Family Medicine

## 2020-06-16 ENCOUNTER — Encounter: Payer: Self-pay | Admitting: Physical Medicine and Rehabilitation

## 2020-06-16 DIAGNOSIS — M5440 Lumbago with sciatica, unspecified side: Secondary | ICD-10-CM

## 2020-06-16 DIAGNOSIS — M48062 Spinal stenosis, lumbar region with neurogenic claudication: Secondary | ICD-10-CM | POA: Insufficient documentation

## 2020-06-16 DIAGNOSIS — G4701 Insomnia due to medical condition: Secondary | ICD-10-CM | POA: Diagnosis not present

## 2020-06-16 DIAGNOSIS — G8929 Other chronic pain: Secondary | ICD-10-CM

## 2020-06-16 NOTE — Addendum Note (Signed)
Addended by: Hortencia Pilar on: 06/16/2020 04:06 PM   Modules accepted: Orders

## 2020-06-16 NOTE — Telephone Encounter (Signed)
I called and advised the patient of the results. She already goes to pain management - would now like to try going to a chiropractor. She has an appointment for follow up here on 06/21/20 - she said she would still like to come in for that.

## 2020-06-16 NOTE — Telephone Encounter (Signed)
Referral made to Dr. Noberto Retort.

## 2020-06-16 NOTE — Telephone Encounter (Signed)
Lumbar MRI scan shows some disc bulging and arthritis at L2-3 and L5-S1, similar to MRI scan from February 2020.  No nerve compression, no indication for surgery.  The fusion looks solid.

## 2020-06-16 NOTE — Progress Notes (Signed)
Subjective:    Patient ID: Alisha Espinoza, female    DOB: 1965-04-24, 55 y.o.   MRN: [id number]  HPI  Due to national recommendations of social distancing because of COVID 51, an audio/video tele-health visit is felt to be the most appropriate encounter for this patient at this time. See MyChart message from today for the patient's consent to a tele-health encounter with [facility]. This is a follow up tele-visit via phone. The patient is at home. MD is at office.   Alisha Espinoza is a 55 year old woman s/p multiple spinal surgeries, who presents for follow-up of left sided lower back pain radiating into the left leg secondary to spinal stenosis.  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 Ernestina Patches. He has ordered an MRI. I discussed results of lumbar MRI with her today -She gets hives to Meloxicam -Gabapentin makes her eyes cross- she does take 400mg  at night and it puts her to sleep.  -her Tramadol stopped providing relief, we started low dose Norco and this is also not helping her enough. -Her goal is to be able to be out of pain to shower and function.   2) Insomnia: -she only sleeps 3 hours at night, but does take naps during the day.  -She is willing to try Amitriptyline 10mg .   Prior history: Her pain comes and goes and she feels ready to return to walking. She did a mile before her foot procedure and felt good. Her current pain is worse in her bilateral groin.   She feels ready to return to work.   She lose 2 lbs since last visit.  Has been using Tramadol as needed. She is using Tramadol approximately once per day. She is tired of taking medications, getting shots, getting surgery. She does not need refill at this time.   Prior history: Current medications: Tramadol 50mg  2-3 time per day.  She takes the Ibuprofen once per day. She has been eating with this. It does not bother her  belly. She has been taking 800mg  once per day.  These are both helping.  Her car broke down so she has not been waling as much recently. She was able to do half a mile. She walked 1 mile one day after she got the steroid injection.   The pain has started to come back a little bit after her injection.   MRI lumbar spine reviewed with patient as follows: 1. Broad-based disc protrusion and bilateral facet hypertrophy at L3-4 results in severe right and moderate left subarticular narrowing. Moderate foraminal stenosis is present bilaterally. 2. Large right lateral disc protrusion at L4-5 with severe right and mild left subarticular narrowing. 3. Moderate right and mild left foraminal narrowing at L4-5. 4. Leftward disc protrusion at L5-S1 extends into the left neural foramen without significant stenosis. 5. Broad-based disc protrusion and mild bilateral facet hypertrophy at L2-3 with mild subarticular and foraminal narrowing bilaterally.   Pain Inventory Average Pain 9 Pain Right Now 9 My pain is constant, sharp, stabbing and aching  In the last 24 hours, has pain interfered with the following? General activity 9 Relation with others 3 Enjoyment of life 10 What TIME of day is your pain at its worst? morning , daytime, evening and night Sleep (in general) Poor  Pain is worse with: walking, sitting and some activites Pain improves with: rest and heat/ice Relief from Meds: 0  Family History  Problem Relation Age of Onset  . Diabetes Mother   . Hypertension Father   . Pancreatic cancer Sister   . Colon cancer Neg Hx   . Rectal cancer Neg Hx   . Stomach cancer Neg Hx   . Colon polyps Neg Hx   . Esophageal cancer Neg Hx    Social History   Socioeconomic History  . Marital status: Divorced    Spouse name: Not on file  . Number of children: Not on file  . Years of education: Not on file  . Highest education level: Not on file  Occupational History  . Not on file  Tobacco  Use  . Smoking status: Current Every Day Smoker    Packs/day: 0.50    Years: 30.00    Pack years: 15.00    Types: Cigarettes  . Smokeless tobacco: Never Used  . Tobacco comment: 10cigs/day  Vaping Use  . Vaping Use: Never used  Substance and Sexual Activity  . Alcohol use: Not Currently    Comment: occasionally  . Drug use: No  . Sexual activity: Yes    Birth control/protection: Surgical    Comment: Hysterectomy  Other Topics Concern  . Not on file  Social History Narrative  . Not on file   Social Determinants of Health   Financial Resource Strain: Not on file  Food Insecurity: Not on file  Transportation Needs: Not on file  Physical Activity: Not on file  Stress: Not on file  Social Connections: Not on file   Past Surgical History:  Procedure Laterality Date  . ABDOMINAL HYSTERECTOMY  2000  . ANTERIOR CERVICAL DECOMP/DISCECTOMY FUSION  03/19/2012   Procedure: ANTERIOR CERVICAL DECOMPRESSION/DISCECTOMY FUSION 1 LEVEL;  Surgeon: Melina Schools, MD;  Location: [facility];  Service: Orthopedics;  Laterality: Left;  Total Disc Replacement C4-5  . ANTERIOR CERVICAL DECOMP/DISCECTOMY FUSION N/A 05/07/2012   Procedure: ANTERIOR CERVICAL DECOMPRESSION/DISCECTOMY FUSION 1 LEVEL/HARDWARE REMOVAL;  Surgeon: Melina Schools, MD;  Location: [facility];  Service: Orthopedics;  Laterality: N/A;  REMOVAL OF CERVICAL DISC REPLACEMENT AND ACDF C4-5  . BACK SURGERY    . CARPAL TUNNEL RELEASE Right 09/28/2018   Procedure: CARPAL TUNNEL RELEASE;  Surgeon: Eustace Moore, MD;  Location: [facility];  Service: Neurosurgery;  Laterality: Right;  CARPAL TUNNEL RELEASE  . CERVICAL FUSION  05/07/2012   Dr Rolena Infante  . COLONOSCOPY    . CYSTECTOMY     L wrist  . POLYPECTOMY    . ROTATOR CUFF REPAIR     Right  . TRIGGER FINGER RELEASE Left 05/03/2019   Procedure: LEFT LONG AND RING FINGER RELEASE TRIGGER FINGER/A-1 PULLEY;  Surgeon: Leanora Cover, MD;  Location: [facility];  Service: Orthopedics;  Laterality:  Left;  beir block  . TRIGGER FINGER RELEASE Right 09/23/2019   Procedure: RELEASE TRIGGER FINGER/A-1 PULLEY RIGHT THUMB, LONG AND RING FINGERS;  Surgeon: Leanora Cover, MD;  Location: [facility];  Service: Orthopedics;  Laterality: Right;  . TYMPANOSTOMY TUBE PLACEMENT     Past Surgical History:  Procedure Laterality Date  . ABDOMINAL HYSTERECTOMY  2000  . ANTERIOR CERVICAL DECOMP/DISCECTOMY FUSION  03/19/2012   Procedure: ANTERIOR CERVICAL DECOMPRESSION/DISCECTOMY FUSION 1 LEVEL;  Surgeon: Melina Schools, MD;  Location: [facility];  Service: Orthopedics;  Laterality: Left;  Total Disc Replacement C4-5  . ANTERIOR CERVICAL DECOMP/DISCECTOMY FUSION N/A 05/07/2012   Procedure: ANTERIOR CERVICAL DECOMPRESSION/DISCECTOMY FUSION 1 LEVEL/HARDWARE REMOVAL;  Surgeon: Melina Schools, MD;  Location: [facility];  Service: Orthopedics;  Laterality:  N/A;  REMOVAL OF CERVICAL DISC REPLACEMENT AND ACDF C4-5  . BACK SURGERY    . CARPAL TUNNEL RELEASE Right 09/28/2018   Procedure: CARPAL TUNNEL RELEASE;  Surgeon: Eustace Moore, MD;  Location: [facility];  Service: Neurosurgery;  Laterality: Right;  CARPAL TUNNEL RELEASE  . CERVICAL FUSION  05/07/2012   Dr Rolena Infante  . COLONOSCOPY    . CYSTECTOMY     L wrist  . POLYPECTOMY    . ROTATOR CUFF REPAIR     Right  . TRIGGER FINGER RELEASE Left 05/03/2019   Procedure: LEFT LONG AND RING FINGER RELEASE TRIGGER FINGER/A-1 PULLEY;  Surgeon: Leanora Cover, MD;  Location: [facility];  Service: Orthopedics;  Laterality: Left;  beir block  . TRIGGER FINGER RELEASE Right 09/23/2019   Procedure: RELEASE TRIGGER FINGER/A-1 PULLEY RIGHT THUMB, LONG AND RING FINGERS;  Surgeon: Leanora Cover, MD;  Location: [facility];  Service: Orthopedics;  Laterality: Right;  . TYMPANOSTOMY TUBE PLACEMENT     Past Medical History:  Diagnosis Date  . Bronchitis   . Diabetes mellitus ([place]) 01/19/2019  . Dyspnea   . Heart murmur    resolved "closed by age 18."   .  Hyperlipidemia   . Hypertension   . Seasonal allergies    Ht 5\' 6"  (1.676 m)   Wt 204 lb (92.5 kg)   BMI 32.93 kg/m   Opioid Risk Score:   Fall Risk Score:  `1  Depression screen PHQ 2/9  Depression screen [facility] 2/9 03/02/2020 11/16/2019 10/08/2019 08/24/2019 01/25/2019  Decreased Interest 0 0 0 2 0  Down, Depressed, Hopeless 0 0 0 2 1  PHQ - 2 Score 0 0 0 4 1  Altered sleeping - - - 3 -  Tired, decreased energy - - - 1 -  Change in appetite - - - 1 -  Feeling bad or failure about yourself  - - - 1 -  Trouble concentrating - - - 1 -  Moving slowly or fidgety/restless - - - 3 -  Suicidal thoughts - - - 0 -  PHQ-9 Score - - - 14 -  Difficult doing work/chores - - - Somewhat difficult -  Some recent data might be hidden    Review of Systems  Constitutional: Negative.   HENT: Negative.   Eyes: Negative.   Respiratory: Negative.   Cardiovascular: Negative.   Gastrointestinal: Negative.   Endocrine: Negative.   Genitourinary: Negative.   Musculoskeletal: Positive for arthralgias, back pain and gait problem.  Skin: Negative.   Allergic/Immunologic: Negative.   Neurological: Positive for weakness.       Tingling   Psychiatric/Behavioral: Negative.   All other systems reviewed and are negative.      Objective:   Physical Exam Patient was seen via phone    Assessment & Plan:  Mrs. 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 Leg pain >back. -excellent benefit from [facility] -Right sided sciatic pain aggravated by recent SI joint injection.  -Reviewed MRI lumbar spine results with patient that shows left sided foraminal stenosis, severe at L3-L4 and facet arthropathy. Given severe pain, we repeated MRI and it is mostly stable. Discussed repeating ESI and  she would like to clear with her other physician first.  -Discussed current symptoms of pain and  history of pain.  -Discussed benefits of exercise in reducing pain -Made goal to walk three times per week 1/2 a mile at least and eat 2TB olive oil per day for anti-inflammatory benefits. -Stop Tramadol and Gabapentin -Continue Norco 5mg  BID PRN (MME 10). Advised regarding side effects and this is not intended for chronic treatment. She is agreeable.  -She is no longer ready to return to work.   -Discussed current symptoms of pain and history of pain.  -Discussed benefits of exercise in reducing pain. -Discussed following foods that may reduce pain: 1) Ginger 2) Blueberries 3) Salmon 4) Pumpkin seeds 5) dark chocolate 6) turmeric 7) tart cherries 8) virgin olive oil 9) chilli peppers 10) mint 11) red wine 12) garlic  Link to further information on diet for chronic pain: http://[email or web address].[email or web address]   Left hip pain -reviewed MRI with patient that shows mild degeneration and fraying of superior labrum -Advised conitnued PT and home exercises -Discussed prolotherapy and advised her to read more about this option.  Insomnia: -discussed trying Amitriptyline 10mg  at night and she is agreeable -Discussed that this medication can also help with neuropathic pain.   10 minutes spent in explaining to patient results of lumbar MRI- explaining facet arthritis, spinal stenosis, and recommending repeat ESI since she had excellent benefit from this in August

## 2020-06-19 ENCOUNTER — Other Ambulatory Visit: Payer: Self-pay | Admitting: Physical Medicine and Rehabilitation

## 2020-06-20 ENCOUNTER — Telehealth: Payer: Self-pay

## 2020-06-20 NOTE — Telephone Encounter (Signed)
Would you please fax the [place] MRI results to [facility]?

## 2020-06-20 NOTE — Telephone Encounter (Signed)
Faxed [phone]

## 2020-06-20 NOTE — Telephone Encounter (Signed)
Patient called she stated she needs her mri results to be sent to her insurance company call [phone]

## 2020-06-21 ENCOUNTER — Ambulatory Visit (INDEPENDENT_AMBULATORY_CARE_PROVIDER_SITE_OTHER): Payer: Medicaid Other | Admitting: Family Medicine

## 2020-06-21 ENCOUNTER — Other Ambulatory Visit: Payer: Self-pay

## 2020-06-21 DIAGNOSIS — M5440 Lumbago with sciatica, unspecified side: Secondary | ICD-10-CM

## 2020-06-21 DIAGNOSIS — G8929 Other chronic pain: Secondary | ICD-10-CM

## 2020-06-21 MED ORDER — HYDROCODONE-ACETAMINOPHEN 5-325 MG PO TABS: 1.0000 | ORAL_TABLET | Freq: Two times a day (BID) | ORAL | 0 refills | Status: DC

## 2020-06-21 NOTE — Progress Notes (Signed)
Office Visit Note   Patient: Alisha Espinoza           Date of Birth: Feb 04, 1966           MRN: [id number] Visit Date: 06/21/2020 Requested by: Drue Flirt, MD [phone],  [place] [address] PCP: Drue Flirt, MD  Subjective: Chief Complaint  Patient presents with  . Lower Back - Pain, Follow-up    MRI review    HPI: She is here for follow-up low back pain, to discuss MRI results.  MRI showed some disc bulging and degenerative change, with solid surgical fusion.  No new nerve compression, no significant change since last MRI scan.  She is still in quite a bit of pain, left lower back with radiation into the left leg.  At this point she does not think she will be able to return to work.              ROS:   All other systems were reviewed and are negative.  Objective: Vital Signs: There were no vitals taken for this visit.  Physical Exam:  General:  Alert and oriented, in no acute distress. Pulm:  Breathing unlabored. Psy:  Normal mood, congruent affect.  Low back: She is tender near the left SI joint.  Also tender in the midline L4-5 and L5-S1 area.  Pain with straight leg raise.  Imaging: No results found.  Assessment & Plan: 1.  Chronic left lower back pain status post fusion.  Suspect some of her pain is from sacroiliac dysfunction.  No indication for surgery based on new MRI scan. -Out of work indefinitely while pursuing treatment.  She will start seeing Dr. Noberto Retort for chiropractic treatments.  Possible SI joint injection next month if she fails to improve.  Based on her ongoing troubles, I think the chances are not very good that she will be able to resume gainful employment.     Procedures: No procedures performed        PMFS History: Patient Active Problem List   Diagnosis Date Noted  . Low back pain 05/25/2020  . 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 Hemorrhoids 01/21/2019  . Essential hypertension 01/21/2019  . Serum calcium elevated  01/21/2019  . Elevated cholesterol 01/19/2019  . Diabetes mellitus ([place]) 01/19/2019  . Trigger ring finger of left hand 01/11/2019  . Trigger finger of right thumb 01/11/2019  . 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 Acute bronchitis 08/08/2015  . Leukocytosis 08/08/2015  . Hyperglycemia 08/08/2015  . Tobacco abuse 08/08/2015  . 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 BACTERIAL PNEUMONIA, RIGHT LOWER LOBE 04/27/2008  . LUMP OR MASS IN BREAST 04/27/2008  . COUGH 04/27/2008   Past Medical History:  Diagnosis Date  . Bronchitis   . Diabetes mellitus ([place]) 01/19/2019  . Dyspnea   . Heart murmur    resolved "closed by age 2."   . Hyperlipidemia   . Hypertension   . Seasonal allergies     Family History  Problem Relation Age of Onset  . Diabetes Mother   . Hypertension Father   . Pancreatic cancer Sister   . Colon cancer Neg Hx   . Rectal cancer Neg Hx   . Stomach cancer Neg Hx   . Colon polyps Neg Hx   . Esophageal cancer Neg Hx     Past Surgical History:  Procedure Laterality Date  . ABDOMINAL HYSTERECTOMY  2000  . ANTERIOR CERVICAL DECOMP/DISCECTOMY FUSION  03/19/2012   Procedure:  ANTERIOR CERVICAL DECOMPRESSION/DISCECTOMY FUSION 1 LEVEL;  Surgeon: Melina Schools, MD;  Location: [facility];  Service: Orthopedics;  Laterality: Left;  Total Disc Replacement C4-5  . ANTERIOR CERVICAL DECOMP/DISCECTOMY FUSION N/A 05/07/2012   Procedure: ANTERIOR CERVICAL DECOMPRESSION/DISCECTOMY FUSION 1 LEVEL/HARDWARE REMOVAL;  Surgeon: Melina Schools, MD;  Location: [facility];  Service: Orthopedics;  Laterality: N/A;  REMOVAL OF CERVICAL DISC REPLACEMENT AND ACDF C4-5  . BACK SURGERY    . CARPAL TUNNEL RELEASE Right 09/28/2018   Procedure: CARPAL TUNNEL RELEASE;  Surgeon: Eustace Moore, MD;  Location: [facility];  Service: Neurosurgery;  Laterality: Right;  CARPAL TUNNEL RELEASE  . CERVICAL FUSION  05/07/2012   Dr Rolena Infante  . COLONOSCOPY    . CYSTECTOMY     L wrist  . POLYPECTOMY    . ROTATOR CUFF  REPAIR     Right  . TRIGGER FINGER RELEASE Left 05/03/2019   Procedure: LEFT LONG AND RING FINGER RELEASE TRIGGER FINGER/A-1 PULLEY;  Surgeon: Leanora Cover, MD;  Location: [facility];  Service: Orthopedics;  Laterality: Left;  beir block  . TRIGGER FINGER RELEASE Right 09/23/2019   Procedure: RELEASE TRIGGER FINGER/A-1 PULLEY RIGHT THUMB, LONG AND RING FINGERS;  Surgeon: Leanora Cover, MD;  Location: [facility];  Service: Orthopedics;  Laterality: Right;  . TYMPANOSTOMY TUBE PLACEMENT     Social History   Occupational History  . Not on file  Tobacco Use  . Smoking status: Current Every Day Smoker    Packs/day: 0.50    Years: 30.00    Pack years: 15.00    Types: Cigarettes  . Smokeless tobacco: Never Used  . Tobacco comment: 10cigs/day  Vaping Use  . Vaping Use: Never used  Substance and Sexual Activity  . Alcohol use: Not Currently    Comment: occasionally  . Drug use: No  . Sexual activity: Yes    Birth control/protection: Surgical    Comment: Hysterectomy

## 2020-06-22 ENCOUNTER — Encounter: Payer: Medicaid Other | Admitting: Podiatry

## 2020-06-25 ENCOUNTER — Other Ambulatory Visit: Payer: Medicaid Other

## 2020-06-26 ENCOUNTER — Ambulatory Visit
Admission: RE | Admit: 2020-06-26 | Discharge: 2020-06-26 | Disposition: A | Discharge status: Home / Self Care | Payer: Medicaid Other | Source: Ambulatory Visit | Attending: Family Medicine | Admitting: Family Medicine

## 2020-06-26 ENCOUNTER — Other Ambulatory Visit: Payer: Self-pay | Admitting: Family Medicine

## 2020-06-26 DIAGNOSIS — R062 Wheezing: Secondary | ICD-10-CM

## 2020-07-03 ENCOUNTER — Encounter: Payer: Medicare Other | Admitting: Physical Medicine and Rehabilitation

## 2020-07-11 ENCOUNTER — Ambulatory Visit: Payer: Medicaid Other | Admitting: Physical Medicine and Rehabilitation

## 2020-07-17 ENCOUNTER — Encounter: Payer: Medicaid Other | Admitting: Podiatry

## 2020-07-18 ENCOUNTER — Encounter: Payer: Medicare Other | Attending: Physical Medicine and Rehabilitation | Admitting: Physical Medicine & Rehabilitation

## 2020-07-18 ENCOUNTER — Encounter: Payer: Self-pay | Admitting: Physical Medicine & Rehabilitation

## 2020-07-18 ENCOUNTER — Other Ambulatory Visit: Payer: Self-pay

## 2020-07-18 VITALS — BP 118/81 | HR 85 | Temp 98.5°F | Ht 66.0 in | Wt 204.6 lb

## 2020-07-18 DIAGNOSIS — M5417 Radiculopathy, lumbosacral region: Secondary | ICD-10-CM | POA: Diagnosis present

## 2020-07-18 DIAGNOSIS — Z79891 Long term (current) use of opiate analgesic: Secondary | ICD-10-CM | POA: Diagnosis present

## 2020-07-18 DIAGNOSIS — Z5181 Encounter for therapeutic drug level monitoring: Secondary | ICD-10-CM | POA: Insufficient documentation

## 2020-07-18 DIAGNOSIS — G894 Chronic pain syndrome: Secondary | ICD-10-CM | POA: Insufficient documentation

## 2020-07-18 MED ORDER — GABAPENTIN 300 MG PO CAPS: 600.0000 mg | ORAL_CAPSULE | Freq: Three times a day (TID) | ORAL | 0 refills | Status: DC

## 2020-07-18 NOTE — Progress Notes (Signed)
Left  L5-S1 Lumbar transforaminal epidural steroid injection under fluoroscopic guidance with contrast enhancement  Indication: Lumbosacral radiculitis is not relieved by medication management or other conservative care and interfering with self-care and mobility.   Informed consent was obtained after describing risk and benefits of the procedure with the patient, this includes bleeding, bruising, infection, paralysis and medication side effects.  The patient wishes to proceed and has given written consent.  Patient was placed in prone position.  The lumbar area was marked and prepped with Betadine.  It was entered with a 25-gauge 1-1/2 inch needle and one mL of 1% lidocaine was injected into the skin and subcutaneous tissue.  Then a 22-gauge 5in spinal needle was inserted into the Left L4-5 and L5-S1 intervertebral foramen under AP, lateral, and oblique view.  Once needle tip was within the foramen on lateral views an dnor exceeding 6 o clock position on th epedical on AP viewed Isovue 200 was inected x 34ml Then a solution containing one mL of 10 mg per mL dexamethasone and 2 mL of 1% lidocaine was injected.  The patient complained of LLE pain which subsided after needle repositioning but otherwise tolerated procedure well.  Post procedure instructions were given.  Please see post procedure form.

## 2020-07-18 NOTE — Progress Notes (Signed)
  PROCEDURE RECORD [facility]   Name: Alisha Espinoza DOB:Jun 22, 1965 MRN: [id number]  Date:07/18/2020  Physician: Alysia Penna, MD    Nurse/CMA: Geryl Rankins  Allergies:  Allergies  Allergen Reactions  . Meloxicam Hives  . Mobic [Meloxicam]     Other reaction(s): Hives    Consent Signed: Yes.    Is patient diabetic? No.  CBG today? N/A  Pregnant: No. LMP: No LMP recorded. Patient has had a hysterectomy. (age 55-55)  Anticoagulants: no Anti-inflammatory: no Antibiotics: no  Procedure:left L4-5, L5-S1 transforaminal epidural steroid injection  Position: Prone Start Time: 12:22pm End Time:12:29PM Fluoro Time: 23s   RN/CMA Jerline Pain MA [place] CMA    Time 11:57 AM 12:33PM    BP 118/81 124/83    Pulse 85 86    Respirations 16 16    O2 Sat 98 96    S/S 6 6    Pain Level 7/10 5/10     D/C home with friend   , patient A & O X 3, D/C instructions reviewed, and sits independently.

## 2020-07-18 NOTE — Patient Instructions (Signed)
You received an epidural steroid injection under fluoroscopic guidance. This is the most accurate way to perform an epidural injection. This injection was performed to relieve thigh or leg or foot pain that may be related to a pinched nerve in the lumbar spine. The local anesthetic injected today may cause numbness in your leg for a couple hours. If it is severe we may need to observe you for 30-60 minutes after the injection. The cortisone medicine injected today may take several days to take full effect. This medicine can also cause facial flushing or feeling of being warm.  This injection may last for days weeks or months. It can be repeated if needed. If it is not effective, another spinal level may need to be injected. Other treatments include medication management as well as physical therapy. In some cases surgery may be an option.  If left leg pain does not subside you have a prescription for gabapentin 2 tablets po every 6 hours for 1 day

## 2020-07-19 ENCOUNTER — Encounter (HOSPITAL_BASED_OUTPATIENT_CLINIC_OR_DEPARTMENT_OTHER): Payer: Medicare Other | Admitting: Physical Medicine and Rehabilitation

## 2020-07-19 ENCOUNTER — Other Ambulatory Visit: Payer: Self-pay

## 2020-07-19 ENCOUNTER — Encounter: Payer: Self-pay | Admitting: Physical Medicine and Rehabilitation

## 2020-07-19 VITALS — BP 138/89 | HR 89 | Temp 97.9°F | Ht 66.0 in | Wt 205.8 lb

## 2020-07-19 DIAGNOSIS — Z5181 Encounter for therapeutic drug level monitoring: Secondary | ICD-10-CM

## 2020-07-19 DIAGNOSIS — M5417 Radiculopathy, lumbosacral region: Secondary | ICD-10-CM | POA: Diagnosis not present

## 2020-07-19 DIAGNOSIS — Z79891 Long term (current) use of opiate analgesic: Secondary | ICD-10-CM

## 2020-07-19 DIAGNOSIS — G894 Chronic pain syndrome: Secondary | ICD-10-CM

## 2020-07-19 MED ORDER — HYDROCODONE-ACETAMINOPHEN 5-325 MG PO TABS: 1.0000 | ORAL_TABLET | Freq: Every day | ORAL | 0 refills | Status: DC | PRN

## 2020-07-19 NOTE — Progress Notes (Signed)
Subjective:    Patient ID: Alisha Espinoza, female    DOB: Feb 10, 1966, 55 y.o.   MRN: [id number]  HPI  Alisha Espinoza is a 55 year old woman s/p multiple spinal surgeries, who presents for f/u of left sided lower back pain radiating into the left leg secondary to lumbar spinal stenosis.  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 Ernestina Patches. He has ordered an MRI. I discussed results of lumbar MRI with her today -She gets hives to Meloxicam -Gabapentin makes her eyes cross- she does take 400mg  at night and it puts her to sleep.  -her Tramadol stopped providing relief, we started low dose Norco and this is also not helping her enough. -Her goal is to be able to be out of pain to shower and function. -she had repeated ESI with Dr. Letta Pate with good relief. -Continues to take the Norco.   2) Insomnia: -she only sleeps 3 hours at night, but does take naps during the day.  -She is willing to try Amitriptyline 10mg .   3) Constipation: -using an over the counter laxative. -using the bathroom once per day in the morning.   Prior history: Her pain comes and goes and she feels ready to return to walking. She did a mile before her foot procedure and felt good. Her current pain is worse in her bilateral groin.   She feels ready to return to work.   She lose 2 lbs since last visit.  Has been using Tramadol as needed. She is using Tramadol approximately once per day. She is tired of taking medications, getting shots, getting surgery. She does not need refill at this time.   Prior history: Current medications: Tramadol 50mg  2-3 time per day.  She takes the Ibuprofen once per day. She has been eating with this. It does not bother her belly. She has been taking 800mg  once per day.  These are both helping.  Her car broke down so she has not been waling as much recently. She was able to do half a mile. She walked 1 mile one day after she  got the steroid injection.   The pain has started to come back a little bit after her injection.   MRI lumbar spine reviewed with patient as follows: 1. Broad-based disc protrusion and bilateral facet hypertrophy at L3-4 results in severe right and moderate left subarticular narrowing. Moderate foraminal stenosis is present bilaterally. 2. Large right lateral disc protrusion at L4-5 with severe right and mild left subarticular narrowing. 3. Moderate right and mild left foraminal narrowing at L4-5. 4. Leftward disc protrusion at L5-S1 extends into the left neural foramen without significant stenosis. 5. Broad-based disc protrusion and mild bilateral facet hypertrophy at L2-3 with mild subarticular and foraminal narrowing bilaterally.   Pain Inventory Average Pain 5 Pain Right Now 4 My pain is sharp, dull, tingling and aching  In the last 24 hours, has pain interfered with the following? General activity 7 Relation with others 6 Enjoyment of life 9 What TIME of day is your pain at its worst? morning , daytime, evening and night Sleep (in general) NA  Pain is worse with: walking, bending, sitting, standing and some activites Pain improves with: rest, heat/ice and medication Relief from Meds: 3  Family History  Problem Relation Age of Onset  . Diabetes Mother   . Hypertension Father   . Pancreatic cancer Sister   . Colon cancer Neg Hx   .  Rectal cancer Neg Hx   . Stomach cancer Neg Hx   . Colon polyps Neg Hx   . Esophageal cancer Neg Hx    Social History   Socioeconomic History  . Marital status: Divorced    Spouse name: Not on file  . Number of children: Not on file  . Years of education: Not on file  . Highest education level: Not on file  Occupational History  . Not on file  Tobacco Use  . Smoking status: Current Every Day Smoker    Packs/day: 0.50    Years: 30.00    Pack years: 15.00    Types: Cigarettes  . Smokeless tobacco: Never Used  . Tobacco comment:  10cigs/day  Vaping Use  . Vaping Use: Never used  Substance and Sexual Activity  . Alcohol use: Not Currently    Comment: occasionally  . Drug use: No  . Sexual activity: Yes    Birth control/protection: Surgical    Comment: Hysterectomy  Other Topics Concern  . Not on file  Social History Narrative  . Not on file   Social Determinants of Health   Financial Resource Strain: Not on file  Food Insecurity: Not on file  Transportation Needs: Not on file  Physical Activity: Not on file  Stress: Not on file  Social Connections: Not on file   Past Surgical History:  Procedure Laterality Date  . ABDOMINAL HYSTERECTOMY  2000  . ANTERIOR CERVICAL DECOMP/DISCECTOMY FUSION  03/19/2012   Procedure: ANTERIOR CERVICAL DECOMPRESSION/DISCECTOMY FUSION 1 LEVEL;  Surgeon: Melina Schools, MD;  Location: [place];  Service: Orthopedics;  Laterality: Left;  Total Disc Replacement C4-5  . ANTERIOR CERVICAL DECOMP/DISCECTOMY FUSION N/A 05/07/2012   Procedure: ANTERIOR CERVICAL DECOMPRESSION/DISCECTOMY FUSION 1 LEVEL/HARDWARE REMOVAL;  Surgeon: Melina Schools, MD;  Location: [place];  Service: Orthopedics;  Laterality: N/A;  REMOVAL OF CERVICAL DISC REPLACEMENT AND ACDF C4-5  . BACK SURGERY    . CARPAL TUNNEL RELEASE Right 09/28/2018   Procedure: CARPAL TUNNEL RELEASE;  Surgeon: Eustace Moore, MD;  Location: [place];  Service: Neurosurgery;  Laterality: Right;  CARPAL TUNNEL RELEASE  . CERVICAL FUSION  05/07/2012   Dr Rolena Infante  . COLONOSCOPY    . CYSTECTOMY     L wrist  . POLYPECTOMY    . ROTATOR CUFF REPAIR     Right  . TRIGGER FINGER RELEASE Left 05/03/2019   Procedure: LEFT LONG AND RING FINGER RELEASE TRIGGER FINGER/A-1 PULLEY;  Surgeon: Leanora Cover, MD;  Location: [place];  Service: Orthopedics;  Laterality: Left;  beir block  . TRIGGER FINGER RELEASE Right 09/23/2019   Procedure: RELEASE TRIGGER FINGER/A-1 PULLEY RIGHT THUMB, LONG AND RING FINGERS;  Surgeon: Leanora Cover, MD;  Location:  [place];  Service: Orthopedics;  Laterality: Right;  . TYMPANOSTOMY TUBE PLACEMENT     Past Surgical History:  Procedure Laterality Date  . ABDOMINAL HYSTERECTOMY  2000  . ANTERIOR CERVICAL DECOMP/DISCECTOMY FUSION  03/19/2012   Procedure: ANTERIOR CERVICAL DECOMPRESSION/DISCECTOMY FUSION 1 LEVEL;  Surgeon: Melina Schools, MD;  Location: [place];  Service: Orthopedics;  Laterality: Left;  Total Disc Replacement C4-5  . ANTERIOR CERVICAL DECOMP/DISCECTOMY FUSION N/A 05/07/2012   Procedure: ANTERIOR CERVICAL DECOMPRESSION/DISCECTOMY FUSION 1 LEVEL/HARDWARE REMOVAL;  Surgeon: Melina Schools, MD;  Location: [place];  Service: Orthopedics;  Laterality: N/A;  REMOVAL OF CERVICAL DISC REPLACEMENT AND ACDF C4-5  . BACK SURGERY    . CARPAL TUNNEL RELEASE Right 09/28/2018   Procedure: CARPAL TUNNEL RELEASE;  Surgeon: Eustace Moore, MD;  Location: [place];  Service: Neurosurgery;  Laterality: Right;  CARPAL TUNNEL RELEASE  . CERVICAL FUSION  05/07/2012   Dr Rolena Infante  . COLONOSCOPY    . CYSTECTOMY     L wrist  . POLYPECTOMY    . ROTATOR CUFF REPAIR     Right  . TRIGGER FINGER RELEASE Left 05/03/2019   Procedure: LEFT LONG AND RING FINGER RELEASE TRIGGER FINGER/A-1 PULLEY;  Surgeon: Leanora Cover, MD;  Location: [place];  Service: Orthopedics;  Laterality: Left;  beir block  . TRIGGER FINGER RELEASE Right 09/23/2019   Procedure: RELEASE TRIGGER FINGER/A-1 PULLEY RIGHT THUMB, LONG AND RING FINGERS;  Surgeon: Leanora Cover, MD;  Location: [place];  Service: Orthopedics;  Laterality: Right;  . TYMPANOSTOMY TUBE PLACEMENT     Past Medical History:  Diagnosis Date  . Bronchitis   . Diabetes mellitus ([place]) 01/19/2019  . Dyspnea   . Heart murmur    resolved "closed by age 46."   . Hyperlipidemia   . Hypertension   . Seasonal allergies    BP 138/89   Pulse 89   Temp 97.9 F (36.6 C)   Ht 5\' 6"  (1.676 m)   Wt 205 lb 12.8 oz (93.4 kg)   SpO2 94%   BMI 33.22  kg/m   Opioid Risk Score:   Fall Risk Score:  `1  Depression screen PHQ 2/9  Depression screen [facility] 2/9 07/19/2020 07/18/2020 03/02/2020 11/16/2019 10/08/2019 08/24/2019 01/25/2019  Decreased Interest 1 1 0 0 0 2 0  Down, Depressed, Hopeless 1 1 0 0 0 2 1  PHQ - 2 Score 2 2 0 0 0 4 1  Altered sleeping - - - - - 3 -  Tired, decreased energy - - - - - 1 -  Change in appetite - - - - - 1 -  Feeling bad or failure about yourself  - - - - - 1 -  Trouble concentrating - - - - - 1 -  Moving slowly or fidgety/restless - - - - - 3 -  Suicidal thoughts - - - - - 0 -  PHQ-9 Score - - - - - 14 -  Difficult doing work/chores - - - - - Somewhat difficult -  Some recent data might be hidden    Review of Systems  Constitutional: Negative.   HENT: Negative.   Eyes: Negative.   Respiratory: Negative.   Cardiovascular: Negative.   Gastrointestinal: Negative.   Endocrine: Negative.   Genitourinary: Negative.   Musculoskeletal: Positive for arthralgias, back pain and gait problem.  Skin: Negative.   Allergic/Immunologic: Negative.   Neurological: Positive for weakness.       Tingling   Psychiatric/Behavioral: Negative.   All other systems reviewed and are negative.      Objective:   Physical Exam Gen: no distress, normal appearing HEENT: oral mucosa pink and moist, NCAT Cardio: Reg rate Chest: normal effort, normal rate of breathing Abd: soft, non-distended Ext: no edema Psych: pleasant, normal affect Skin: intact Neuro: Alert and oriented x3. Pain with lumbar flexion (and scared to aggravate pain). Spinal extension feels good. 4/5 strength throughout lower extremities. Ambulating. Guarded, without AD.     Assessment & Plan:  Mrs. Engelstad is a 55 year old woman who presents with left sided low back pain radiating into her leg secondary to her spinal stenosis with neurogenic claudication  Chronic Pain Syndrome secondary to left sided foraminal stenosis, L3-L4, L4-5,  L5-S1- most  symptomatic currently. Leg pain >back. -excellent benefit from [facility] -Right sided sciatic pain aggravated by recent SI joint injection.  -Reviewed MRI lumbar spine results with patient that shows left sided foraminal stenosis, severe at L3-L4 and facet arthropathy. Given severe pain, we repeated MRI and it is mostly stable. Discussed repeating ESI and she would like to clear with her other physician first.  -Discussed current symptoms of pain and history of pain.  -Discussed benefits of exercise in reducing pain -Made goal to walk three times per week 1/2 a mile at least and eat 2TB olive oil per day for anti-inflammatory benefits. -Stop Tramadol and Gabapentin -Decrease Norco to daily PRN (5MME) Advised regarding side effects and this is not intended for chronic treatment. She is agreeable.  -She is no longer ready to return to work.   -Discussed current symptoms of pain and history of pain.  -Discussed following foods that may reduce pain: 1) Ginger (especially studied for arthritis)- reduce leukotriene production to decrease inflammation 2) Blueberries- high in phytonutrients that decrease inflammation 3) Salmon- marine omega-3s reduce joint swelling and pain 4) Pumpkin seeds- reduce inflammation 5) dark chocolate- reduces inflammation 6) turmeric- reduces inflammation 7) tart cherries - reduce pain and stiffness 8) extra virgin olive oil - its compound olecanthal helps to block prostaglandins  9) chili peppers- can be eaten or applied topically via capsaicin 10) mint- helpful for headache, muscle aches, joint pain, and itching 11) garlic- reduces inflammation  Link to further information on diet for chronic pain: [email or web address].[email or web address]   Link to further information on diet for chronic pain: [email or web address]   Left hip pain -reviewed MRI with  patient that shows mild degeneration and fraying of superior labrum -Advised conitnued PT and home exercises -Discussed prolotherapy and advised her to read more about this option.  Insomnia: -discussed trying Amitriptyline 10mg  at night and she is agreeable -Discussed that this medication can also help with neuropathic pain.   Constipation:  -Provided list of following foods that help with constipation and highlighted a few: 1) prunes- contain high amounts of fiber.  2) apples- has a form of dietary fiber called pectin that accelerates stool movement and increases beneficial gut bacteria 3) pears- in addition to fiber, also high in fructose and sorbitol which have laxative effect 4) figs- contain an enzyme ficin which helps to speed colonic transit 5) kiwis- contain an enzyme actinidin that improves gut motility and reduces constipation 6) oranges- rich in pectin (like apples) 7) grapefruits- contain a flavanol naringenin which has a laxative effect 8) vegetables- rich in fiber and also great sources of folate, vitamin C, and K 9) artichoke- high in inulin, prebiotic great for the microbiome 10) chicory- increases stool frequency and softness (can be added to coffee) 11) rhubarb- laxative effect 12) sweet potato- high fiber 13) beans, peas, and lentils- contain both soluble and insoluble fiber 14) chia seeds- improves intestinal health and gut flora 15) flaxseeds- laxative effect 16) whole grain rye bread- high in fiber 17) oat bran- high in soluble and insoluble fiber 18) kefir- softens stools -recommended to try at least one of these foods every day.  -drink 6-8 glasses of water per day -walk regularly, especially after meals.

## 2020-07-19 NOTE — Patient Instructions (Signed)
-  Discussed following foods that may reduce pain: 1) Ginger (especially studied for arthritis)- reduce leukotriene production to decrease inflammation 2) Blueberries- high in phytonutrients that decrease inflammation 3) Salmon- marine omega-3s reduce joint swelling and pain 4) Pumpkin seeds- reduce inflammation 5) dark chocolate- reduces inflammation 6) turmeric- reduces inflammation 7) tart cherries - reduce pain and stiffness 8) extra virgin olive oil - its compound olecanthal helps to block prostaglandins  9) chili peppers- can be eaten or applied topically via capsaicin 10) mint- helpful for headache, muscle aches, joint pain, and itching 11) garlic- reduces inflammation  Link to further information on diet for chronic pain: http://www.[email or web address]  Constipation:  -Provided list of following foods that help with constipation and highlighted a few: 1) prunes- contain high amounts of fiber.  2) apples- has a form of dietary fiber called pectin that accelerates stool movement and increases beneficial gut bacteria 3) pears- in addition to fiber, also high in fructose and sorbitol which have laxative effect 4) figs- contain an enzyme ficin which helps to speed colonic transit 5) kiwis- contain an enzyme actinidin that improves gut motility and reduces constipation 6) oranges- rich in pectin (like apples) 7) grapefruits- contain a flavanol naringenin which has a laxative effect 8) vegetables- rich in fiber and also great sources of folate, vitamin C, and K 9) artichoke- high in inulin, prebiotic great for the microbiome 10) chicory- increases stool frequency and softness (can be added to coffee) 11) rhubarb- laxative effect 12) sweet potato- high fiber 13) beans, peas, and lentils- contain both soluble and insoluble fiber 14) chia seeds- improves intestinal health and gut flora 15) flaxseeds- laxative effect 16) whole  grain rye bread- high in fiber 17) oat bran- high in soluble and insoluble fiber 18) kefir- softens stools -recommended to try at least one of these foods every day.  -drink 6-8 glasses of water per day -walk regularly, especially after meals.

## 2020-07-26 LAB — TOXASSURE SELECT,+ANTIDEPR,UR

## 2020-07-31 ENCOUNTER — Telehealth: Payer: Self-pay | Admitting: *Deleted

## 2020-07-31 NOTE — Telephone Encounter (Signed)
Urine drug screen for this encounter is negative for medication but this may be consistent. Her last dose reported taken was the day before the test and she is only taking one daily or PRN.

## 2020-08-16 NOTE — Progress Notes (Signed)
Subjective:    Patient ID: Alisha Espinoza, female    DOB: February 14, 1966, 55 y.o.   MRN: [id number]  HPI: Alisha Espinoza is a 55 y.o. female who returns for follow up appointment for chronic pain and medication refill. She states her pain is located in her lower back radiating into left lower extremity and left foot. She  rates her pain 6. Her current exercise regime is walking and performing stretching exercises.  Ms. Keil Morphine equivalent is 5.00 MME.  Last UDS was Performed on 07/19/2020, see note for details.   Pain Inventory Average Pain 6 Pain Right Now 6 My pain is sharp, dull, tingling and aching  In the last 24 hours, has pain interfered with the following? General activity 5 Relation with others 8 Enjoyment of life 9 What TIME of day is your pain at its worst? morning , daytime, evening and night Sleep (in general) Poor  Pain is worse with: walking, bending, sitting, standing and some activites Pain improves with: rest, heat/ice, pacing activities and medication Relief from Meds: 3  Family History  Problem Relation Age of Onset  . Diabetes Mother   . Hypertension Father   . Pancreatic cancer Sister   . Colon cancer Neg Hx   . Rectal cancer Neg Hx   . Stomach cancer Neg Hx   . Colon polyps Neg Hx   . Esophageal cancer Neg Hx    Social History   Socioeconomic History  . Marital status: Divorced    Spouse name: Not on file  . Number of children: Not on file  . Years of education: Not on file  . Highest education level: Not on file  Occupational History  . Not on file  Tobacco Use  . Smoking status: Current Every Day Smoker    Packs/day: 0.50    Years: 30.00    Pack years: 15.00    Types: Cigarettes  . Smokeless tobacco: Never Used  . Tobacco comment: 10cigs/day  Vaping Use  . Vaping Use: Never used  Substance and Sexual Activity  . Alcohol use: Not Currently    Comment: occasionally  . Drug use: No  . Sexual activity: Yes    Birth  control/protection: Surgical    Comment: Hysterectomy  Other Topics Concern  . Not on file  Social History Narrative  . Not on file   Social Determinants of Health   Financial Resource Strain: Not on file  Food Insecurity: Not on file  Transportation Needs: Not on file  Physical Activity: Not on file  Stress: Not on file  Social Connections: Not on file   Past Surgical History:  Procedure Laterality Date  . ABDOMINAL HYSTERECTOMY  2000  . ANTERIOR CERVICAL DECOMP/DISCECTOMY FUSION  03/19/2012   Procedure: ANTERIOR CERVICAL DECOMPRESSION/DISCECTOMY FUSION 1 LEVEL;  Surgeon: Melina Schools, MD;  Location: [place];  Service: Orthopedics;  Laterality: Left;  Total Disc Replacement C4-5  . ANTERIOR CERVICAL DECOMP/DISCECTOMY FUSION N/A 05/07/2012   Procedure: ANTERIOR CERVICAL DECOMPRESSION/DISCECTOMY FUSION 1 LEVEL/HARDWARE REMOVAL;  Surgeon: Melina Schools, MD;  Location: [place];  Service: Orthopedics;  Laterality: N/A;  REMOVAL OF CERVICAL DISC REPLACEMENT AND ACDF C4-5  . BACK SURGERY    . CARPAL TUNNEL RELEASE Right 09/28/2018   Procedure: CARPAL TUNNEL RELEASE;  Surgeon: Eustace Moore, MD;  Location: [place];  Service: Neurosurgery;  Laterality: Right;  CARPAL TUNNEL RELEASE  . CERVICAL FUSION  05/07/2012   Dr Rolena Infante  . COLONOSCOPY    . CYSTECTOMY  L wrist  . POLYPECTOMY    . ROTATOR CUFF REPAIR     Right  . TRIGGER FINGER RELEASE Left 05/03/2019   Procedure: LEFT LONG AND RING FINGER RELEASE TRIGGER FINGER/A-1 PULLEY;  Surgeon: Leanora Cover, MD;  Location: [place];  Service: Orthopedics;  Laterality: Left;  beir block  . TRIGGER FINGER RELEASE Right 09/23/2019   Procedure: RELEASE TRIGGER FINGER/A-1 PULLEY RIGHT THUMB, LONG AND RING FINGERS;  Surgeon: Leanora Cover, MD;  Location: [place];  Service: Orthopedics;  Laterality: Right;  . TYMPANOSTOMY TUBE PLACEMENT     Past Surgical History:  Procedure Laterality Date  . ABDOMINAL HYSTERECTOMY  2000   . ANTERIOR CERVICAL DECOMP/DISCECTOMY FUSION  03/19/2012   Procedure: ANTERIOR CERVICAL DECOMPRESSION/DISCECTOMY FUSION 1 LEVEL;  Surgeon: Melina Schools, MD;  Location: [place];  Service: Orthopedics;  Laterality: Left;  Total Disc Replacement C4-5  . ANTERIOR CERVICAL DECOMP/DISCECTOMY FUSION N/A 05/07/2012   Procedure: ANTERIOR CERVICAL DECOMPRESSION/DISCECTOMY FUSION 1 LEVEL/HARDWARE REMOVAL;  Surgeon: Melina Schools, MD;  Location: [place];  Service: Orthopedics;  Laterality: N/A;  REMOVAL OF CERVICAL DISC REPLACEMENT AND ACDF C4-5  . BACK SURGERY    . CARPAL TUNNEL RELEASE Right 09/28/2018   Procedure: CARPAL TUNNEL RELEASE;  Surgeon: Eustace Moore, MD;  Location: [place];  Service: Neurosurgery;  Laterality: Right;  CARPAL TUNNEL RELEASE  . CERVICAL FUSION  05/07/2012   Dr Rolena Infante  . COLONOSCOPY    . CYSTECTOMY     L wrist  . POLYPECTOMY    . ROTATOR CUFF REPAIR     Right  . TRIGGER FINGER RELEASE Left 05/03/2019   Procedure: LEFT LONG AND RING FINGER RELEASE TRIGGER FINGER/A-1 PULLEY;  Surgeon: Leanora Cover, MD;  Location: [place];  Service: Orthopedics;  Laterality: Left;  beir block  . TRIGGER FINGER RELEASE Right 09/23/2019   Procedure: RELEASE TRIGGER FINGER/A-1 PULLEY RIGHT THUMB, LONG AND RING FINGERS;  Surgeon: Leanora Cover, MD;  Location: [place];  Service: Orthopedics;  Laterality: Right;  . TYMPANOSTOMY TUBE PLACEMENT     Past Medical History:  Diagnosis Date  . Bronchitis   . Diabetes mellitus ([place]) 01/19/2019  . Dyspnea   . Heart murmur    resolved "closed by age 5."   . Hyperlipidemia   . Hypertension   . Seasonal allergies    BP 125/83   Pulse 84   Temp 98.8 F (37.1 C)   Ht 5\' 6"  (1.676 m)   Wt 208 lb 3.2 oz (94.4 kg)   SpO2 92%   BMI 33.60 kg/m   Opioid Risk Score:   Fall Risk Score:  `1  Depression screen PHQ 2/9  Depression screen [facility] 2/9 07/19/2020 07/18/2020 03/02/2020 11/16/2019 10/08/2019 08/24/2019 01/25/2019  Decreased  Interest 1 1 0 0 0 2 0  Down, Depressed, Hopeless 1 1 0 0 0 2 1  PHQ - 2 Score 2 2 0 0 0 4 1  Altered sleeping - - - - - 3 -  Tired, decreased energy - - - - - 1 -  Change in appetite - - - - - 1 -  Feeling bad or failure about yourself  - - - - - 1 -  Trouble concentrating - - - - - 1 -  Moving slowly or fidgety/restless - - - - - 3 -  Suicidal thoughts - - - - - 0 -  PHQ-9 Score - - - - - 14 -  Difficult doing work/chores - - - - -  Somewhat difficult -  Some recent data might be hidden    Review of Systems  Musculoskeletal: Positive for arthralgias, back pain and gait problem.  Neurological: Positive for weakness.       Tingling  All other systems reviewed and are negative.      Objective:   Physical Exam Vitals and nursing note reviewed.  Constitutional:      Appearance: Normal appearance.  Cardiovascular:     Rate and Rhythm: Normal rate and regular rhythm.     Pulses: Normal pulses.     Heart sounds: Normal heart sounds.  Musculoskeletal:     Cervical back: Normal range of motion and neck supple.     Comments: Normal Muscle Bulk and Muscle Testing Reveals:  Upper Extremities:Full ROM and Muscle Strength 5/5  Lower Extremities : Full ROM and Muscle Strength 5/5 Arises from table with ease Narrow Based Gait   Skin:    General: Skin is warm and dry.  Neurological:     Mental Status: She is alert and oriented to person, place, and time.  Psychiatric:        Mood and Affect: Mood normal.        Behavior: Behavior normal.           Assessment & Plan:  1. Lumbosacral Radiculopathy: Continue Gabapentin. Continue HEP as Tolerated. Continue to Monitor.  2. Lumbar Spinal Stenosis: Continue HEP as Tolerated. Continue current medication regimen. Continue to monitor.  3. Chronic Pain Syndrome: Refilled Hydrocodone 5/325 mg one tablet daily as needed for pain #30.  We will continue the opioid monitoring program, this consists of regular clinic visits, examinations, urine  drug screen, pill counts as well as use of [place] Controlled Substance Reporting system. A 12 month History has been reviewed on the [place] on 08/17/2020  F/U in 1 month

## 2020-08-17 ENCOUNTER — Other Ambulatory Visit: Payer: Self-pay

## 2020-08-17 ENCOUNTER — Telehealth: Payer: Self-pay | Admitting: Family Medicine

## 2020-08-17 ENCOUNTER — Encounter: Payer: Medicare Other | Attending: Physical Medicine and Rehabilitation | Admitting: Registered Nurse

## 2020-08-17 VITALS — BP 125/83 | HR 84 | Temp 98.8°F | Ht 66.0 in | Wt 208.2 lb

## 2020-08-17 DIAGNOSIS — G894 Chronic pain syndrome: Secondary | ICD-10-CM

## 2020-08-17 DIAGNOSIS — Z79891 Long term (current) use of opiate analgesic: Secondary | ICD-10-CM | POA: Diagnosis present

## 2020-08-17 DIAGNOSIS — M48062 Spinal stenosis, lumbar region with neurogenic claudication: Secondary | ICD-10-CM | POA: Diagnosis present

## 2020-08-17 DIAGNOSIS — Z5181 Encounter for therapeutic drug level monitoring: Secondary | ICD-10-CM | POA: Diagnosis present

## 2020-08-17 DIAGNOSIS — M5417 Radiculopathy, lumbosacral region: Secondary | ICD-10-CM | POA: Diagnosis present

## 2020-08-17 MED ORDER — HYDROCODONE-ACETAMINOPHEN 5-325 MG PO TABS: 1.0000 | ORAL_TABLET | Freq: Every day | ORAL | 0 refills | Status: DC | PRN

## 2020-08-17 NOTE — Telephone Encounter (Signed)
Received call from patient. She is requesting copy of the IME report from 5/10 that was sent to Dr. Junius Roads from [place] related to the L[employer] claim. I advised pt it was just sent to the scan center. Advised I would check her chart next week and call her when it's scanned in to her chart.

## 2020-08-22 ENCOUNTER — Encounter: Payer: Self-pay | Admitting: Registered Nurse

## 2020-09-01 ENCOUNTER — Ambulatory Visit (INDEPENDENT_AMBULATORY_CARE_PROVIDER_SITE_OTHER): Payer: Medicare Other | Admitting: Family Medicine

## 2020-09-01 ENCOUNTER — Other Ambulatory Visit: Payer: Self-pay

## 2020-09-01 ENCOUNTER — Encounter: Payer: Self-pay | Admitting: Family Medicine

## 2020-09-01 DIAGNOSIS — M5440 Lumbago with sciatica, unspecified side: Secondary | ICD-10-CM | POA: Diagnosis not present

## 2020-09-01 DIAGNOSIS — G8929 Other chronic pain: Secondary | ICD-10-CM | POA: Diagnosis not present

## 2020-09-01 NOTE — Progress Notes (Signed)
Office Visit Note   Patient: Alisha Espinoza           Date of Birth: 01-Sep-1965           MRN: [id number] Visit Date: 09/01/2020 Requested by: Drue Flirt, MD [phone],  [place] [address] PCP: Drue Flirt, MD  Subjective: Chief Complaint  Patient presents with  . Other    Discuss results of IME - what is the next step?    HPI: She is here for follow-up chronic low back pain status post lumbar fusion.  Since last visit she underwent independent medical evaluation and light duty restrictions were recommended.  She is still in quite a bit of pain, seeing chiropractor.  She does not feel that she can return to work at this point.  She would like a second opinion.              ROS:   All other systems were reviewed and are negative.  Objective: Vital Signs: There were no vitals taken for this visit.  Physical Exam:  General:  Alert and oriented, in no acute distress. Pulm:  Breathing unlabored. Psy:  Normal mood, congruent affect  No exam done.  Imaging: No results found.  Assessment & Plan: 1.  Chronic LBP - Out of work 3 months while awaiting consultation at [facility].     Procedures: No procedures performed        PMFS History: Patient Active Problem List   Diagnosis Date Noted  . Low back pain 05/25/2020  . 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 Hemorrhoids 01/21/2019  . Essential hypertension 01/21/2019  . Serum calcium elevated 01/21/2019  . Elevated cholesterol 01/19/2019  . Diabetes mellitus ([place]) 01/19/2019  . Trigger ring finger of left hand 01/11/2019  . Trigger finger of right thumb 01/11/2019  . 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 Acute bronchitis 08/08/2015  . Leukocytosis 08/08/2015  . Hyperglycemia 08/08/2015  . Tobacco abuse 08/08/2015  . 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 BACTERIAL PNEUMONIA, RIGHT LOWER LOBE 04/27/2008  . LUMP OR MASS IN BREAST 04/27/2008  . COUGH 04/27/2008   Past Medical  History:  Diagnosis Date  . Bronchitis   . Diabetes mellitus ([place]) 01/19/2019  . Dyspnea   . Heart murmur    resolved "closed by age 55."   . Hyperlipidemia   . Hypertension   . Seasonal allergies     Family History  Problem Relation Age of Onset  . Diabetes Mother   . Hypertension Father   . Pancreatic cancer Sister   . Colon cancer Neg Hx   . Rectal cancer Neg Hx   . Stomach cancer Neg Hx   . Colon polyps Neg Hx   . Esophageal cancer Neg Hx     Past Surgical History:  Procedure Laterality Date  . ABDOMINAL HYSTERECTOMY  2000  . ANTERIOR CERVICAL DECOMP/DISCECTOMY FUSION  03/19/2012   Procedure: ANTERIOR CERVICAL DECOMPRESSION/DISCECTOMY FUSION 1 LEVEL;  Surgeon: Melina Schools, MD;  Location: [place];  Service: Orthopedics;  Laterality: Left;  Total Disc Replacement C4-5  . ANTERIOR CERVICAL DECOMP/DISCECTOMY FUSION N/A 05/07/2012   Procedure: ANTERIOR CERVICAL DECOMPRESSION/DISCECTOMY FUSION 1 LEVEL/HARDWARE REMOVAL;  Surgeon: Melina Schools, MD;  Location: [place];  Service: Orthopedics;  Laterality: N/A;  REMOVAL OF CERVICAL DISC REPLACEMENT AND ACDF C4-5  . BACK SURGERY    . CARPAL TUNNEL RELEASE Right 09/28/2018   Procedure: CARPAL TUNNEL RELEASE;  Surgeon: Eustace Moore, MD;  Location: [facility];  Service: Neurosurgery;  Laterality: Right;  CARPAL TUNNEL RELEASE  . CERVICAL FUSION  05/07/2012   Dr Rolena Infante  . COLONOSCOPY    . CYSTECTOMY     L wrist  . POLYPECTOMY    . ROTATOR CUFF REPAIR     Right  . TRIGGER FINGER RELEASE Left 05/03/2019   Procedure: LEFT LONG AND RING FINGER RELEASE TRIGGER FINGER/A-1 PULLEY;  Surgeon: Leanora Cover, MD;  Location: [place];  Service: Orthopedics;  Laterality: Left;  beir block  . TRIGGER FINGER RELEASE Right 09/23/2019   Procedure: RELEASE TRIGGER FINGER/A-1 PULLEY RIGHT THUMB, LONG AND RING FINGERS;  Surgeon: Leanora Cover, MD;  Location: [place];  Service: Orthopedics;  Laterality: Right;  . TYMPANOSTOMY TUBE  PLACEMENT     Social History   Occupational History  . Not on file  Tobacco Use  . Smoking status: Current Every Day Smoker    Packs/day: 0.50    Years: 30.00    Pack years: 15.00    Types: Cigarettes  . Smokeless tobacco: Never Used  . Tobacco comment: 10cigs/day  Vaping Use  . Vaping Use: Never used  Substance and Sexual Activity  . Alcohol use: Not Currently    Comment: occasionally  . Drug use: No  . Sexual activity: Yes    Birth control/protection: Surgical    Comment: Hysterectomy

## 2020-09-14 ENCOUNTER — Encounter: Payer: Medicare Other | Admitting: Physical Medicine and Rehabilitation

## 2020-09-14 ENCOUNTER — Telehealth: Payer: Self-pay

## 2020-09-14 NOTE — Telephone Encounter (Signed)
Patient had to reschedule appt from today due to her car would not start. She is rescheduled for 10/23/20. She is requesting refill on Hydocodone, last filled 08/17/20. She is also requested to add one tablet to her regiment due to increased pain.

## 2020-09-15 ENCOUNTER — Other Ambulatory Visit: Payer: Self-pay

## 2020-09-15 ENCOUNTER — Encounter: Payer: Medicare Other | Attending: Physical Medicine and Rehabilitation | Admitting: Registered Nurse

## 2020-09-15 ENCOUNTER — Other Ambulatory Visit: Payer: Self-pay | Admitting: *Deleted

## 2020-09-15 ENCOUNTER — Encounter: Payer: Self-pay | Admitting: Registered Nurse

## 2020-09-15 VITALS — BP 122/86 | HR 99 | Temp 98.4°F | Ht 66.0 in | Wt 207.6 lb

## 2020-09-15 DIAGNOSIS — M5417 Radiculopathy, lumbosacral region: Secondary | ICD-10-CM

## 2020-09-15 DIAGNOSIS — Z79891 Long term (current) use of opiate analgesic: Secondary | ICD-10-CM | POA: Diagnosis present

## 2020-09-15 DIAGNOSIS — Z5181 Encounter for therapeutic drug level monitoring: Secondary | ICD-10-CM | POA: Insufficient documentation

## 2020-09-15 DIAGNOSIS — G894 Chronic pain syndrome: Secondary | ICD-10-CM

## 2020-09-15 DIAGNOSIS — M48062 Spinal stenosis, lumbar region with neurogenic claudication: Secondary | ICD-10-CM | POA: Diagnosis present

## 2020-09-15 MED ORDER — HYDROCODONE-ACETAMINOPHEN 5-325 MG PO TABS: 1.0000 | ORAL_TABLET | Freq: Two times a day (BID) | ORAL | 0 refills | Status: DC | PRN

## 2020-09-15 NOTE — Progress Notes (Signed)
Subjective:    Patient ID: Alisha Espinoza, female    DOB: 10/10/1965, 55 y.o.   MRN: [id number]  HPI: Alisha Espinoza is a 55 y.o. female who returns for follow up appointment for chronic pain and medication refill. She states her pain is located in her lower back radiating into her left lower extremity. Lso reports her current medication regimen is not controlling her pain. This was discussed with Dr Ranell Patrick, her hydrocodone was increased to twice a day as needed for pain, she verbalizes understanding. She rates her pain 7. Her current exercise regime is walking and performing stretching exercises.   Ms. Rasmus Morphine equivalent is 5.00  MME.   Last UDS was Performed on 07/19/2020, it was consistent.    Pain Inventory Average Pain 8 Pain Right Now 7 My pain is constant, sharp, stabbing, tingling, and aching  In the last 24 hours, has pain interfered with the following? General activity 3 Relation with others 4 Enjoyment of life 3 What TIME of day is your pain at its worst? morning , daytime, evening, and night Sleep (in general) Poor  Pain is worse with: walking, bending, sitting, standing, and some activites Pain improves with: rest, heat/ice, and medication Relief from Meds: 4  Family History  Problem Relation Age of Onset   Diabetes Mother    Hypertension Father    Pancreatic cancer Sister    Colon cancer Neg Hx    Rectal cancer Neg Hx    Stomach cancer Neg Hx    Colon polyps Neg Hx    Esophageal cancer Neg Hx    Social History   Socioeconomic History   Marital status: Divorced    Spouse name: Not on file   Number of children: Not on file   Years of education: Not on file   Highest education level: Not on file  Occupational History   Not on file  Tobacco Use   Smoking status: Every Day    Packs/day: 0.50    Years: 30.00    Pack years: 15.00    Types: Cigarettes   Smokeless tobacco: Never   Tobacco comments:    10cigs/day  Vaping Use   Vaping Use: Never  used  Substance and Sexual Activity   Alcohol use: Not Currently    Comment: occasionally   Drug use: No   Sexual activity: Yes    Birth control/protection: Surgical    Comment: Hysterectomy  Other Topics Concern   Not on file  Social History Narrative   Not on file   Social Determinants of Health   Financial Resource Strain: Not on file  Food Insecurity: Not on file  Transportation Needs: Not on file  Physical Activity: Not on file  Stress: Not on file  Social Connections: Not on file   Past Surgical History:  Procedure Laterality Date   ABDOMINAL HYSTERECTOMY  2000   ANTERIOR CERVICAL DECOMP/DISCECTOMY FUSION  03/19/2012   Procedure: ANTERIOR CERVICAL DECOMPRESSION/DISCECTOMY FUSION 1 LEVEL;  Surgeon: Melina Schools, MD;  Location: [place];  Service: Orthopedics;  Laterality: Left;  Total Disc Replacement C4-5   ANTERIOR CERVICAL DECOMP/DISCECTOMY FUSION N/A 05/07/2012   Procedure: ANTERIOR CERVICAL DECOMPRESSION/DISCECTOMY FUSION 1 LEVEL/HARDWARE REMOVAL;  Surgeon: Melina Schools, MD;  Location: [place];  Service: Orthopedics;  Laterality: N/A;  REMOVAL OF CERVICAL DISC REPLACEMENT AND ACDF C4-5   BACK SURGERY     CARPAL TUNNEL RELEASE Right 09/28/2018   Procedure: CARPAL TUNNEL RELEASE;  Surgeon: Eustace Moore, MD;  Location:  [place] OR;  Service: Neurosurgery;  Laterality: Right;  CARPAL TUNNEL RELEASE   CERVICAL FUSION  05/07/2012   Dr Rolena Infante   COLONOSCOPY     CYSTECTOMY     L wrist   POLYPECTOMY     ROTATOR CUFF REPAIR     Right   TRIGGER FINGER RELEASE Left 05/03/2019   Procedure: LEFT LONG AND RING FINGER RELEASE TRIGGER FINGER/A-1 PULLEY;  Surgeon: Leanora Cover, MD;  Location: [place];  Service: Orthopedics;  Laterality: Left;  beir block   TRIGGER FINGER RELEASE Right 09/23/2019   Procedure: RELEASE TRIGGER FINGER/A-1 PULLEY RIGHT THUMB, LONG AND RING FINGERS;  Surgeon: Leanora Cover, MD;  Location: [place];  Service: Orthopedics;  Laterality:  Right;   TYMPANOSTOMY TUBE PLACEMENT     Past Surgical History:  Procedure Laterality Date   ABDOMINAL HYSTERECTOMY  2000   ANTERIOR CERVICAL DECOMP/DISCECTOMY FUSION  03/19/2012   Procedure: ANTERIOR CERVICAL DECOMPRESSION/DISCECTOMY FUSION 1 LEVEL;  Surgeon: Melina Schools, MD;  Location: [place];  Service: Orthopedics;  Laterality: Left;  Total Disc Replacement C4-5   ANTERIOR CERVICAL DECOMP/DISCECTOMY FUSION N/A 05/07/2012   Procedure: ANTERIOR CERVICAL DECOMPRESSION/DISCECTOMY FUSION 1 LEVEL/HARDWARE REMOVAL;  Surgeon: Melina Schools, MD;  Location: [place];  Service: Orthopedics;  Laterality: N/A;  REMOVAL OF CERVICAL DISC REPLACEMENT AND ACDF C4-5   BACK SURGERY     CARPAL TUNNEL RELEASE Right 09/28/2018   Procedure: CARPAL TUNNEL RELEASE;  Surgeon: Eustace Moore, MD;  Location: [place];  Service: Neurosurgery;  Laterality: Right;  CARPAL TUNNEL RELEASE   CERVICAL FUSION  05/07/2012   Dr Rolena Infante   COLONOSCOPY     CYSTECTOMY     L wrist   POLYPECTOMY     ROTATOR CUFF REPAIR     Right   TRIGGER FINGER RELEASE Left 05/03/2019   Procedure: LEFT LONG AND RING FINGER RELEASE TRIGGER FINGER/A-1 PULLEY;  Surgeon: Leanora Cover, MD;  Location: [place];  Service: Orthopedics;  Laterality: Left;  beir block   TRIGGER FINGER RELEASE Right 09/23/2019   Procedure: RELEASE TRIGGER FINGER/A-1 PULLEY RIGHT THUMB, LONG AND RING FINGERS;  Surgeon: Leanora Cover, MD;  Location: [place];  Service: Orthopedics;  Laterality: Right;   TYMPANOSTOMY TUBE PLACEMENT     Past Medical History:  Diagnosis Date   Bronchitis    Diabetes mellitus ([place]) 01/19/2019   Dyspnea    Heart murmur    resolved "closed by age 26."    Hyperlipidemia    Hypertension    Seasonal allergies    BP 122/86   Pulse 99   Temp 98.4 F (36.9 C)   Ht 5\' 6"  (1.676 m)   Wt 207 lb 9.6 oz (94.2 kg)   SpO2 95%   BMI 33.51 kg/m   Opioid Risk Score:   Fall Risk Score:  `1  Depression screen PHQ  2/9  Depression screen [facility] 2/9 07/19/2020 07/18/2020 03/02/2020 11/16/2019 10/08/2019 08/24/2019 01/25/2019  Decreased Interest 1 1 0 0 0 2 0  Down, Depressed, Hopeless 1 1 0 0 0 2 1  PHQ - 2 Score 2 2 0 0 0 4 1  Altered sleeping - - - - - 3 -  Tired, decreased energy - - - - - 1 -  Change in appetite - - - - - 1 -  Feeling bad or failure about yourself  - - - - - 1 -  Trouble concentrating - - - - - 1 -  Moving  slowly or fidgety/restless - - - - - 3 -  Suicidal thoughts - - - - - 0 -  PHQ-9 Score - - - - - 14 -  Difficult doing work/chores - - - - - Somewhat difficult -  Some recent data might be hidden    Review of Systems  Musculoskeletal:  Positive for back pain.       Pain in both legs  All other systems reviewed and are negative.     Objective:   Physical Exam Vitals and nursing note reviewed.  Constitutional:      Appearance: Normal appearance.  Cardiovascular:     Rate and Rhythm: Normal rate and regular rhythm.     Pulses: Normal pulses.     Heart sounds: Normal heart sounds.  Pulmonary:     Effort: Pulmonary effort is normal.     Breath sounds: Normal breath sounds.  Musculoskeletal:     Cervical back: Normal range of motion and neck supple.     Comments: Normal Muscle Bulk and Muscle Testing Reveals:  Upper Extremities: Full ROM and Muscle Strength 5/5  Lumbar Paraspinal Tenderness: L-3-L-5 Lower Extremities : Decreased ROM and Muscle Strength 4/5 Bilateral Lower Extremities Flexion Produces Pain into her Bilateral Lower Extremities Arises from Table with ease Narrow Based Gait     Skin:    General: Skin is warm and dry.  Neurological:     Mental Status: She is alert and oriented to person, place, and time.  Psychiatric:        Mood and Affect: Mood normal.        Behavior: Behavior normal.          Assessment & Plan:  1. Lumbosacral Radiculopathy: Continue Gabapentin. Continue HEP as Tolerated. Continue to Monitor.09/15/2020 2. Lumbar Spinal Stenosis:  Continue HEP as Tolerated. Continue current medication regimen. Continue to monitor.09/15/2020 3. Chronic Pain Syndrome: Increased Refilled Hydrocodone 5/325 mg one tablet twice a day as needed for pain #60.  We will continue the opioid monitoring program, this consists of regular clinic visits, examinations, urine drug screen, pill counts as well as use of [place] Controlled Substance Reporting system. A 12 month History has been reviewed on the [place] on 09/15/2020   F/U in 1 month

## 2020-09-19 ENCOUNTER — Telehealth: Payer: Self-pay | Admitting: Family Medicine

## 2020-09-19 NOTE — Telephone Encounter (Signed)
It looks like you did send this on 09/07/20. Would you please check on this and let the patient know?

## 2020-09-19 NOTE — Telephone Encounter (Signed)
Pt called stating she was supposed to have a referral to a neurosurgeon in [place] but when she called the office they said they hadn't received anything. Pt would like for [place] to resend the referral and give her a CB when it has went through successfully.   [phone]

## 2020-09-22 NOTE — Telephone Encounter (Signed)
Refaxed referral to [phone]

## 2020-09-26 ENCOUNTER — Other Ambulatory Visit: Payer: Self-pay | Admitting: Physical Medicine and Rehabilitation

## 2020-09-28 ENCOUNTER — Telehealth: Payer: Self-pay | Admitting: Family Medicine

## 2020-09-28 NOTE — Telephone Encounter (Signed)
Im trying to get pt in for appt, I have faxed X2 to [facility], but I am confused. Are you wanting to send her to [facility] or [facility].   DX states for pain in back. Please advise to where I need to send this pt. thanks

## 2020-09-28 NOTE — Telephone Encounter (Signed)
Pt called stating when she contacted Dr. Maryjo [place] office to set up an appt from a referral they told her the referral was for an ortho office in [place]. Pt would like our office to check on the referral and resend it if need be; then call her to let her know when it's been fixed.   [phone]

## 2020-10-04 ENCOUNTER — Telehealth: Payer: Self-pay | Admitting: Family Medicine

## 2020-10-04 NOTE — Telephone Encounter (Signed)
PT called and states that the neurosurgereon denied her. Please call her back.   CB [phone]

## 2020-10-05 NOTE — Telephone Encounter (Signed)
I called the patient. She was unsure if the call she received was from [facility] neurosurgery or if it was actually from [place]. She said they told her to return to her original surgeon, Dr. Ronnald Ramp (but she cannot, as they do not take her insurance). Could you check on the status of the ortho referral. If she has been denied at both, she is asking if the referral could be sent someplace more local, [place] or [place]. The patient would prefer a neurosurgeon, but will go to an ortho if she must.

## 2020-10-11 ENCOUNTER — Other Ambulatory Visit: Payer: Self-pay

## 2020-10-11 MED ORDER — HYDROCODONE-ACETAMINOPHEN 5-325 MG PO TABS: 1.0000 | ORAL_TABLET | Freq: Two times a day (BID) | ORAL | 0 refills | Status: DC | PRN

## 2020-10-11 NOTE — Telephone Encounter (Signed)
Patient aware you are not in the office today. But she will be out of pain medicine in about 2-3 days.

## 2020-10-23 ENCOUNTER — Ambulatory Visit: Payer: Medicare Other | Admitting: Physical Medicine and Rehabilitation

## 2020-10-23 ENCOUNTER — Telehealth: Payer: Self-pay | Admitting: Family Medicine

## 2020-10-23 NOTE — Telephone Encounter (Signed)
Note written (same as the one provided in March).  The patient will come to pick it up tomorrow afternoon.

## 2020-10-23 NOTE — Telephone Encounter (Signed)
Pt called requesting an out of work note until referral is sent another doctor. Pt did not go into detail about referral. Please call patient about this matter at [phone].

## 2020-10-24 ENCOUNTER — Other Ambulatory Visit: Payer: Self-pay

## 2020-10-24 ENCOUNTER — Encounter: Payer: Medicare Other | Attending: Physical Medicine and Rehabilitation | Admitting: Registered Nurse

## 2020-10-24 ENCOUNTER — Encounter: Payer: Self-pay | Admitting: Registered Nurse

## 2020-10-24 ENCOUNTER — Telehealth: Payer: Self-pay | Admitting: Registered Nurse

## 2020-10-24 VITALS — BP 131/90 | HR 99 | Temp 97.8°F | Ht 66.0 in | Wt 209.0 lb

## 2020-10-24 DIAGNOSIS — G4701 Insomnia due to medical condition: Secondary | ICD-10-CM | POA: Insufficient documentation

## 2020-10-24 DIAGNOSIS — M5417 Radiculopathy, lumbosacral region: Secondary | ICD-10-CM | POA: Diagnosis not present

## 2020-10-24 DIAGNOSIS — Z79891 Long term (current) use of opiate analgesic: Secondary | ICD-10-CM | POA: Insufficient documentation

## 2020-10-24 DIAGNOSIS — G894 Chronic pain syndrome: Secondary | ICD-10-CM | POA: Diagnosis present

## 2020-10-24 DIAGNOSIS — Z5181 Encounter for therapeutic drug level monitoring: Secondary | ICD-10-CM | POA: Insufficient documentation

## 2020-10-24 DIAGNOSIS — M48062 Spinal stenosis, lumbar region with neurogenic claudication: Secondary | ICD-10-CM | POA: Diagnosis present

## 2020-10-24 DIAGNOSIS — M7918 Myalgia, other site: Secondary | ICD-10-CM | POA: Insufficient documentation

## 2020-10-24 MED ORDER — HYDROCODONE-ACETAMINOPHEN 5-325 MG PO TABS: 1.0000 | ORAL_TABLET | Freq: Two times a day (BID) | ORAL | 0 refills | Status: DC | PRN

## 2020-10-24 NOTE — Telephone Encounter (Signed)
Patient called to let Zella Ball know she found her hydrocodone.

## 2020-10-24 NOTE — Progress Notes (Signed)
Subjective:    Patient ID: Alisha Espinoza, female    DOB: [birth year], 55 y.o.   MRN: [id number]  HPI: Alisha Espinoza is a 55 y.o. female who returns for follow up appointment for chronic pain and medication refill. She states her pain is located in her lower back radiating into her bilateral lower extremities L>R. She  rates her pain 7. Her current exercise regime is walking and performing stretching exercises.  Alisha Espinoza forgot her medication, she was educated on the narcotic policy.  Alisha Espinoza Morphine equivalent is 10.00  MME.   Last UDS was Performed on 07/19/2020, it was consistent.    Pain Inventory Average Pain 9 Pain Right Now 7 My pain is sharp, burning, dull, stabbing, tingling, and aching  In the last 24 hours, has pain interfered with the following? General activity 8 Relation with others 8 Enjoyment of life 10 What TIME of day is your pain at its worst? morning , daytime, evening, and night Sleep (in general) Poor  Pain is worse with: walking, bending, sitting, standing, and some activites Pain improves with: rest, heat/ice, and medication Relief from Meds: 4  Family History  Problem Relation Age of Onset   Diabetes Mother    Hypertension Father    Pancreatic cancer Sister    Colon cancer Neg Hx    Rectal cancer Neg Hx    Stomach cancer Neg Hx    Colon polyps Neg Hx    Esophageal cancer Neg Hx    Social History   Socioeconomic History   Marital status: Divorced    Spouse name: Not on file   Number of children: Not on file   Years of education: Not on file   Highest education level: Not on file  Occupational History   Not on file  Tobacco Use   Smoking status: Every Day    Packs/day: 0.50    Years: 30.00    Pack years: 15.00    Types: Cigarettes   Smokeless tobacco: Never   Tobacco comments:    10cigs/day  Vaping Use   Vaping Use: Never used  Substance and Sexual Activity   Alcohol use: Not Currently    Comment: occasionally   Drug use:  No   Sexual activity: Yes    Birth control/protection: Surgical    Comment: Hysterectomy  Other Topics Concern   Not on file  Social History Narrative   Not on file   Social Determinants of Health   Financial Resource Strain: Not on file  Food Insecurity: Not on file  Transportation Needs: Not on file  Physical Activity: Not on file  Stress: Not on file  Social Connections: Not on file   Past Surgical History:  Procedure Laterality Date   ABDOMINAL HYSTERECTOMY  2000   ANTERIOR CERVICAL DECOMP/DISCECTOMY FUSION  03/19/2012   Procedure: ANTERIOR CERVICAL DECOMPRESSION/DISCECTOMY FUSION 1 LEVEL;  Surgeon: Melina Schools, MD;  Location: [place];  Service: Orthopedics;  Laterality: Left;  Total Disc Replacement C4-5   ANTERIOR CERVICAL DECOMP/DISCECTOMY FUSION N/A 05/07/2012   Procedure: ANTERIOR CERVICAL DECOMPRESSION/DISCECTOMY FUSION 1 LEVEL/HARDWARE REMOVAL;  Surgeon: Melina Schools, MD;  Location: [place];  Service: Orthopedics;  Laterality: N/A;  REMOVAL OF CERVICAL DISC REPLACEMENT AND ACDF C4-5   BACK SURGERY     CARPAL TUNNEL RELEASE Right 09/28/2018   Procedure: CARPAL TUNNEL RELEASE;  Surgeon: Eustace Moore, MD;  Location: [place];  Service: Neurosurgery;  Laterality: Right;  CARPAL TUNNEL RELEASE   CERVICAL FUSION  05/07/2012   Dr Rolena Infante   COLONOSCOPY     CYSTECTOMY     L wrist   POLYPECTOMY     ROTATOR CUFF REPAIR     Right   TRIGGER FINGER RELEASE Left 05/03/2019   Procedure: LEFT LONG AND RING FINGER RELEASE TRIGGER FINGER/A-1 PULLEY;  Surgeon: Leanora Cover, MD;  Location: [place];  Service: Orthopedics;  Laterality: Left;  beir block   TRIGGER FINGER RELEASE Right 09/23/2019   Procedure: RELEASE TRIGGER FINGER/A-1 PULLEY RIGHT THUMB, LONG AND RING FINGERS;  Surgeon: Leanora Cover, MD;  Location: [place];  Service: Orthopedics;  Laterality: Right;   TYMPANOSTOMY TUBE PLACEMENT     Past Surgical History:  Procedure Laterality Date    ABDOMINAL HYSTERECTOMY  2000   ANTERIOR CERVICAL DECOMP/DISCECTOMY FUSION  03/19/2012   Procedure: ANTERIOR CERVICAL DECOMPRESSION/DISCECTOMY FUSION 1 LEVEL;  Surgeon: Melina Schools, MD;  Location: [place];  Service: Orthopedics;  Laterality: Left;  Total Disc Replacement C4-5   ANTERIOR CERVICAL DECOMP/DISCECTOMY FUSION N/A 05/07/2012   Procedure: ANTERIOR CERVICAL DECOMPRESSION/DISCECTOMY FUSION 1 LEVEL/HARDWARE REMOVAL;  Surgeon: Melina Schools, MD;  Location: [place];  Service: Orthopedics;  Laterality: N/A;  REMOVAL OF CERVICAL DISC REPLACEMENT AND ACDF C4-5   BACK SURGERY     CARPAL TUNNEL RELEASE Right 09/28/2018   Procedure: CARPAL TUNNEL RELEASE;  Surgeon: Eustace Moore, MD;  Location: [place];  Service: Neurosurgery;  Laterality: Right;  CARPAL TUNNEL RELEASE   CERVICAL FUSION  05/07/2012   Dr Rolena Infante   COLONOSCOPY     CYSTECTOMY     L wrist   POLYPECTOMY     ROTATOR CUFF REPAIR     Right   TRIGGER FINGER RELEASE Left 05/03/2019   Procedure: LEFT LONG AND RING FINGER RELEASE TRIGGER FINGER/A-1 PULLEY;  Surgeon: Leanora Cover, MD;  Location: [place];  Service: Orthopedics;  Laterality: Left;  beir block   TRIGGER FINGER RELEASE Right 09/23/2019   Procedure: RELEASE TRIGGER FINGER/A-1 PULLEY RIGHT THUMB, LONG AND RING FINGERS;  Surgeon: Leanora Cover, MD;  Location: [place];  Service: Orthopedics;  Laterality: Right;   TYMPANOSTOMY TUBE PLACEMENT     Past Medical History:  Diagnosis Date   Bronchitis    Diabetes mellitus ([place]) 01/19/2019   Dyspnea    Heart murmur    resolved "closed by age 4."    Hyperlipidemia    Hypertension    Seasonal allergies    BP 131/90 (BP Location: Right Arm)   Pulse 99   Temp 97.8 F (36.6 C) (Oral)   Ht '5\' 6"'$  (1.676 m)   Wt 209 lb (94.8 kg)   SpO2 96%   BMI 33.73 kg/m   Opioid Risk Score:   Fall Risk Score:  `1  Depression screen PHQ 2/9  Depression screen [facility] 2/9 10/24/2020 09/15/2020 07/19/2020 07/18/2020  03/02/2020 11/16/2019 10/08/2019  Decreased Interest 0 0 1 1 0 0 0  Down, Depressed, Hopeless 0 0 1 1 0 0 0  PHQ - 2 Score 0 0 2 2 0 0 0  Altered sleeping 0 - - - - - -  Tired, decreased energy - - - - - - -  Change in appetite - - - - - - -  Feeling bad or failure about yourself  - - - - - - -  Trouble concentrating - - - - - - -  Moving slowly or fidgety/restless - - - - - - -  Suicidal thoughts - - - - - - -  PHQ-9 Score 0 - - - - - -  Difficult doing work/chores - - - - - - -  Some recent data might be hidden      Review of Systems  Constitutional: Negative.   HENT: Negative.    Eyes: Negative.   Respiratory: Negative.    Cardiovascular: Negative.   Gastrointestinal: Negative.   Endocrine: Negative.   Genitourinary: Negative.   Musculoskeletal:  Positive for back pain and gait problem.       POSTERIOR LEG PAIN , PAIN IN BOTH HANDS  Skin: Negative.   Allergic/Immunologic: Negative.   Hematological: Negative.   Psychiatric/Behavioral: Negative.        Objective:   Physical Exam Vitals and nursing note reviewed.  Constitutional:      Appearance: Normal appearance.  Cardiovascular:     Rate and Rhythm: Normal rate and regular rhythm.     Pulses: Normal pulses.     Heart sounds: Normal heart sounds.  Pulmonary:     Effort: Pulmonary effort is normal.     Breath sounds: Normal breath sounds.  Musculoskeletal:     Cervical back: Normal range of motion and neck supple.     Comments: Normal Muscle Bulk and Muscle Testing Reveals:  Upper Extremities: Full ROM and Muscle Strength  5/5  Lumbar Paraspinal Tenderness: L-4-L-5 Lower Extremities: Decreased ROM and Muscle Strength 5/5 Right Lower Extremity Flexion Produces Pain into her Right Lower Extremity Arises from Table with ease Narrow Based  Gait     Skin:    General: Skin is warm and dry.  Neurological:     Mental Status: She is alert and oriented to person, place, and time.  Psychiatric:        Mood and Affect:  Mood normal.        Behavior: Behavior normal.         Assessment & Plan:  1. Lumbosacral Radiculopathy: Continue Gabapentin. Continue HEP as Tolerated. Continue to Monitor.10/31/2020 2. Lumbar Spinal Stenosis: Continue HEP as Tolerated. Continue current medication regimen. Continue to monitor.10/31/2020 3. Chronic Pain Syndrome: Refilled Hydrocodone 5/325 mg one tablet twice a day as needed for pain #60.  We will continue the opioid monitoring program, this consists of regular clinic visits, examinations, urine drug screen, pill counts as well as use of [place] Controlled Substance Reporting system. A 12 month History has been reviewed on the [place] on 10/31/2020   F/U in 1 month

## 2020-11-03 ENCOUNTER — Other Ambulatory Visit: Payer: Self-pay | Admitting: Family Medicine

## 2020-11-03 DIAGNOSIS — G8929 Other chronic pain: Secondary | ICD-10-CM

## 2020-11-03 DIAGNOSIS — M5440 Lumbago with sciatica, unspecified side: Secondary | ICD-10-CM

## 2020-11-03 NOTE — Progress Notes (Signed)
Requesting an FCE.

## 2020-11-10 ENCOUNTER — Ambulatory Visit: Payer: Medicare Other | Admitting: Registered Nurse

## 2020-11-13 ENCOUNTER — Ambulatory Visit (INDEPENDENT_AMBULATORY_CARE_PROVIDER_SITE_OTHER): Payer: Medicare Other | Admitting: Family Medicine

## 2020-11-13 ENCOUNTER — Encounter: Payer: Self-pay | Admitting: Family Medicine

## 2020-11-13 ENCOUNTER — Other Ambulatory Visit: Payer: Self-pay

## 2020-11-13 DIAGNOSIS — M5440 Lumbago with sciatica, unspecified side: Secondary | ICD-10-CM

## 2020-11-13 DIAGNOSIS — G8929 Other chronic pain: Secondary | ICD-10-CM | POA: Diagnosis not present

## 2020-11-13 NOTE — Progress Notes (Signed)
Office Visit Note   Patient: Alisha Espinoza           Date of Birth: [birth year]           MRN: [id number] Visit Date: 11/13/2020 Requested by: Drue Flirt, MD [phone],  [place] [address] PCP: Drue Flirt, MD  Subjective: Chief Complaint  Patient presents with   Lower Back - Pain, Follow-up    HPI: She is here for follow-up chronic back pain with.  Ongoing pain with radiation into the left leg.  Sometimes it goes into the right but usually the left.  She saw Dr. Noberto Retort for chiropractic.  He suggested the possibility of spinal cord stimulator stimulator trial.                ROS:   All other systems were reviewed and are negative.  Objective: Vital Signs: There were no vitals taken for this visit.  Physical Exam:  General:  Alert and oriented, in no acute distress. Pulm:  Breathing unlabored. Psy:  Normal mood, congruent affect.  Back: She is tender in the left sciatic notch.  Straight leg raise negative, lower extremity strength and reflexes are still normal.    Imaging: No results found.  Assessment & Plan: Chronic low back pain with sciatica -I agree with referral for spinal cord stimulator trial.  We will arrange as per Dr. Ernestina Patches.     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   Hemorrhoids 01/21/2019   Essential hypertension 01/21/2019   Serum calcium elevated 01/21/2019   Elevated cholesterol 01/19/2019   Diabetes mellitus ([place]) 01/19/2019   Trigger ring finger of left hand 01/11/2019   Trigger finger of right thumb 01/11/2019   S/P lumbar fusion 09/28/2018   Acute bronchitis 08/08/2015   Leukocytosis 08/08/2015   Hyperglycemia 08/08/2015   Tobacco abuse 08/08/2015   OTHER CHRONIC INFECTIVE OTITIS EXTERNA 04/27/2008   BACTERIAL PNEUMONIA, RIGHT LOWER LOBE 04/27/2008   LUMP OR MASS IN BREAST  04/27/2008   COUGH 04/27/2008   Past Medical History:  Diagnosis Date   Bronchitis    Diabetes mellitus ([place]) 01/19/2019   Dyspnea    Heart murmur    resolved "closed by age 71."    Hyperlipidemia    Hypertension    Seasonal allergies     Family History  Problem Relation Age of Onset   Diabetes Mother    Hypertension Father    Pancreatic cancer Sister    Colon cancer Neg Hx    Rectal cancer Neg Hx    Stomach cancer Neg Hx    Colon polyps Neg Hx    Esophageal cancer Neg Hx     Past Surgical History:  Procedure Laterality Date   ABDOMINAL HYSTERECTOMY  2000   ANTERIOR CERVICAL DECOMP/DISCECTOMY FUSION  03/19/2012   Procedure: ANTERIOR CERVICAL DECOMPRESSION/DISCECTOMY FUSION 1 LEVEL;  Surgeon: Melina Schools, MD;  Location: [facility];  Service: Orthopedics;  Laterality: Left;  Total Disc Replacement C4-5   ANTERIOR CERVICAL DECOMP/DISCECTOMY FUSION N/A 05/07/2012   Procedure: ANTERIOR CERVICAL DECOMPRESSION/DISCECTOMY FUSION 1 LEVEL/HARDWARE REMOVAL;  Surgeon: Melina Schools, MD;  Location: [facility];  Service: Orthopedics;  Laterality: N/A;  REMOVAL OF CERVICAL DISC REPLACEMENT AND ACDF C4-5   BACK SURGERY     CARPAL TUNNEL RELEASE Right 09/28/2018   Procedure: CARPAL TUNNEL RELEASE;  Surgeon:  Eustace Moore, MD;  Location: [facility];  Service: Neurosurgery;  Laterality: Right;  CARPAL TUNNEL RELEASE   CERVICAL FUSION  05/07/2012   Dr Rolena Infante   COLONOSCOPY     CYSTECTOMY     L wrist   POLYPECTOMY     ROTATOR CUFF REPAIR     Right   TRIGGER FINGER RELEASE Left 05/03/2019   Procedure: LEFT LONG AND RING FINGER RELEASE TRIGGER FINGER/A-1 PULLEY;  Surgeon: Leanora Cover, MD;  Location: [facility];  Service: Orthopedics;  Laterality: Left;  beir block   TRIGGER FINGER RELEASE Right 09/23/2019   Procedure: RELEASE TRIGGER FINGER/A-1 PULLEY RIGHT THUMB, LONG AND RING FINGERS;  Surgeon: Leanora Cover, MD;  Location: [facility];  Service: Orthopedics;  Laterality: Right;    TYMPANOSTOMY TUBE PLACEMENT     Social History   Occupational History   Not on file  Tobacco Use   Smoking status: Every Day    Packs/day: 0.50    Years: 30.00    Pack years: 15.00    Types: Cigarettes   Smokeless tobacco: Never   Tobacco comments:    10cigs/day  Vaping Use   Vaping Use: Never used  Substance and Sexual Activity   Alcohol use: Not Currently    Comment: occasionally   Drug use: No   Sexual activity: Yes    Birth control/protection: Surgical    Comment: Hysterectomy

## 2020-11-14 ENCOUNTER — Other Ambulatory Visit: Payer: Self-pay | Admitting: Physical Medicine and Rehabilitation

## 2020-11-14 ENCOUNTER — Telehealth: Payer: Self-pay

## 2020-11-14 MED ORDER — HYDROCODONE-ACETAMINOPHEN 5-325 MG PO TABS: 1.0000 | ORAL_TABLET | Freq: Two times a day (BID) | ORAL | 0 refills | Status: DC | PRN

## 2020-11-14 NOTE — Telephone Encounter (Signed)
Pmp last filled 10/12/2020

## 2020-11-15 ENCOUNTER — Other Ambulatory Visit: Payer: Self-pay | Admitting: Family Medicine

## 2020-11-15 DIAGNOSIS — Z1231 Encounter for screening mammogram for malignant neoplasm of breast: Secondary | ICD-10-CM

## 2020-11-20 ENCOUNTER — Ambulatory Visit: Payer: Medicare Other | Attending: Family Medicine

## 2020-11-20 ENCOUNTER — Other Ambulatory Visit: Payer: Self-pay

## 2020-11-20 DIAGNOSIS — M5442 Lumbago with sciatica, left side: Secondary | ICD-10-CM | POA: Insufficient documentation

## 2020-11-20 DIAGNOSIS — G8929 Other chronic pain: Secondary | ICD-10-CM | POA: Diagnosis present

## 2020-11-20 NOTE — Therapy (Signed)
[place], [place], [address] Phone: [phone]   Fax:  [phone]  Physical Therapy Evaluation/FCE  Patient Details  Name: Alisha Espinoza MRN: [id number] Date of Birth: 1965/08/28 Referring Provider (PT): Eunice Blase, MD   Encounter Date: 11/20/2020   PT End of Session - 11/20/20 1258     Visit Number 1    Number of Visits 1    PT Start Time 1250    PT Stop Time 1550    PT Time Calculation (min) 180 min    Activity Tolerance Patient limited by pain    Behavior During Therapy --   Pain behaviors , frequent expressions of how bad pain was.            Past Medical History:  Diagnosis Date   Bronchitis    Diabetes mellitus ([facility]) 01/19/2019   Dyspnea    Heart murmur    resolved "closed by age 9."    Hyperlipidemia    Hypertension    Seasonal allergies     Past Surgical History:  Procedure Laterality Date   ABDOMINAL HYSTERECTOMY  2000   ANTERIOR CERVICAL DECOMP/DISCECTOMY FUSION  03/19/2012   Procedure: ANTERIOR CERVICAL DECOMPRESSION/DISCECTOMY FUSION 1 LEVEL;  Surgeon: Melina Schools, MD;  Location: [facility];  Service: Orthopedics;  Laterality: Left;  Total Disc Replacement C4-5   ANTERIOR CERVICAL DECOMP/DISCECTOMY FUSION N/A 05/07/2012   Procedure: ANTERIOR CERVICAL DECOMPRESSION/DISCECTOMY FUSION 1 LEVEL/HARDWARE REMOVAL;  Surgeon: Melina Schools, MD;  Location: [facility];  Service: Orthopedics;  Laterality: N/A;  REMOVAL OF CERVICAL DISC REPLACEMENT AND ACDF C4-5   BACK SURGERY     CARPAL TUNNEL RELEASE Right 09/28/2018   Procedure: CARPAL TUNNEL RELEASE;  Surgeon: Eustace Moore, MD;  Location: [facility];  Service: Neurosurgery;  Laterality: Right;  CARPAL TUNNEL RELEASE   CERVICAL FUSION  05/07/2012   Dr Rolena Infante   COLONOSCOPY     CYSTECTOMY     L wrist   POLYPECTOMY     ROTATOR CUFF REPAIR     Right   TRIGGER FINGER RELEASE Left 05/03/2019   Procedure: LEFT LONG AND RING FINGER RELEASE  TRIGGER FINGER/A-1 PULLEY;  Surgeon: Leanora Cover, MD;  Location: [facility];  Service: Orthopedics;  Laterality: Left;  beir block   TRIGGER FINGER RELEASE Right 09/23/2019   Procedure: RELEASE TRIGGER FINGER/A-1 PULLEY RIGHT THUMB, LONG AND RING FINGERS;  Surgeon: Leanora Cover, MD;  Location: [facility];  Service: Orthopedics;  Laterality: Right;   TYMPANOSTOMY TUBE PLACEMENT      There were no vitals filed for this visit.    Subjective Assessment - 11/20/20 1253     Subjective She reports return to work for 2 months with modified activity/duties. She reports an IT consultant MD said she was able to work. She has hired a  Chief Executive Officer. She stated Dr Junius Roads wanted this testing so she is here.                [facility] PT Assessment - 11/20/20 0001       Assessment   Medical Diagnosis Chronic LBP with sciatica    Referring Provider (PT) Eunice Blase, MD    Onset Date/Surgical Date 04/01/16 (P)    Lumbar fusion 2019   Hand Dominance Right (P)     Next MD Visit PRN    Prior Therapy Yes      Precautions   Precautions None;Other (comment) (P)     Precaution Comments Don't lift  over 20 #, change positions, take breaks (P)       Restrictions   Weight Bearing Restrictions No      Prior Function   Level of Independence Independent    Vocation Unemployed (P)       Cognition   Overall Cognitive Status Within Functional Limits for tasks assessed                        Objective measurements completed on examination: See above findings.               PT Education - 11/20/20 1616     Education Details Soreness post FCE and management of this. Briefly explained the FCE results and implications for work. She stated she was not physically ready to return to work at this time.    Person(s) Educated Patient    Methods Explanation    Comprehension Verbalized understanding                         Plan - 11/20/20 1258      Clinical Impression Statement Ms Hancock returned to PT for a second FCE this year. She was rated for light duty work for an 8 hour day. This was actually a higher rating compared to FCE of 04/05/20. She stated she was not ready/able to return to work at this time due to level of pain.    PT Frequency One time visit    PT Next Visit Plan FCE faxed to MD. May benefit from formal PT but she has had PT in past with limited benefit.    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   Hemorrhoids 01/21/2019   Essential hypertension 01/21/2019   Serum calcium elevated 01/21/2019   Elevated cholesterol 01/19/2019   Diabetes mellitus ([facility]) 01/19/2019   Trigger ring finger of left hand 01/11/2019   Trigger finger of right thumb 01/11/2019   S/P lumbar fusion 09/28/2018   Acute bronchitis 08/08/2015   Leukocytosis 08/08/2015   Hyperglycemia 08/08/2015   Tobacco abuse 08/08/2015   OTHER CHRONIC INFECTIVE OTITIS EXTERNA 04/27/2008   BACTERIAL PNEUMONIA, RIGHT LOWER LOBE 04/27/2008   LUMP OR MASS IN BREAST 04/27/2008   COUGH 04/27/2008    Darrel Hoover  PT 11/20/2020, 4:27 PM  [facility] [address] [place], [place], [address] Phone: [phone]   Fax:  [phone]  Name: Alisha Espinoza MRN: [id number] Date of Birth: [birth year]  PHYSICAL THERAPY DISCHARGE SUMMARY  Visits from Start of Care 1  Current functional level related to goals / functional outcomes: See FCE   Remaining deficits: NA   Education / Equipment: Review of results   Patient agrees to discharge. Patient goals were met.  Patient is being discharged due to meeting the stated rehab goals.

## 2020-11-21 ENCOUNTER — Ambulatory Visit: Payer: Medicare Other

## 2020-11-21 ENCOUNTER — Telehealth: Payer: Self-pay | Admitting: *Deleted

## 2020-11-21 NOTE — Telephone Encounter (Signed)
Alisha Espinoza reports she was only able to get #14 tablets of her hydrocodone 5/325 (confirmed by PMP) and she will need a prior auth and new rx sent to [facility] in [place] (changed in [place]).   She reports the insurance number is [phone].

## 2020-11-21 NOTE — Telephone Encounter (Signed)
Cindi Carbon [place]: [id number] PA Case ID: [id number]  Submitted PA  via covermymeds for hydrocodone-acetaminophen 5-'325mg'$ .

## 2020-11-22 ENCOUNTER — Ambulatory Visit (INDEPENDENT_AMBULATORY_CARE_PROVIDER_SITE_OTHER): Payer: Medicare Other | Admitting: Physical Medicine and Rehabilitation

## 2020-11-22 ENCOUNTER — Telehealth: Payer: Self-pay | Admitting: Family Medicine

## 2020-11-22 ENCOUNTER — Encounter: Payer: Self-pay | Admitting: Physical Medicine and Rehabilitation

## 2020-11-22 ENCOUNTER — Other Ambulatory Visit: Payer: Self-pay

## 2020-11-22 ENCOUNTER — Telehealth: Payer: Self-pay | Admitting: Radiology

## 2020-11-22 VITALS — BP 102/71 | HR 87

## 2020-11-22 DIAGNOSIS — M961 Postlaminectomy syndrome, not elsewhere classified: Secondary | ICD-10-CM | POA: Diagnosis not present

## 2020-11-22 DIAGNOSIS — G894 Chronic pain syndrome: Secondary | ICD-10-CM

## 2020-11-22 DIAGNOSIS — M5416 Radiculopathy, lumbar region: Secondary | ICD-10-CM

## 2020-11-22 MED ORDER — HYDROCODONE-ACETAMINOPHEN 5-325 MG PO TABS: 1.0000 | ORAL_TABLET | Freq: Two times a day (BID) | ORAL | 0 refills | Status: DC | PRN

## 2020-11-22 NOTE — Telephone Encounter (Signed)
I called and spoke with the patient, explaining that Dr. Louanne Skye and Dr. Ernestina Patches would be working in conjunction on her back issue. Their names, being somewhat similar, caused some confusion.  She said she would call you back to schedule an appointment with Dr. Louanne Skye.

## 2020-11-22 NOTE — Telephone Encounter (Signed)
PMP was Reviewed.  [name] CMA Note was Reviewed.  This provider called [facility] and spoke with the Pharmacy Technician.  Hydrocodone e-scribed today. Alisha Espinoza will call Alisha Espinoza regarding her Hydrocodone.

## 2020-11-22 NOTE — Progress Notes (Signed)
Low back pain radiating down posterior and lateral legs to calf. Left side is sometimes worse. [facility] pain [facility] patient. Numeric Pain Rating Scale and Functional Assessment Average Pain 8 Pain Right Now 8 My pain is constant, dull, and aching Pain is worse with: walking, bending, and standing Pain improves with: heat/ice and medication   In the last MONTH (on 0-10 scale) has pain interfered with the following?  1. General activity like being  able to carry out your everyday physical activities such as walking, climbing stairs, carrying groceries, or moving a chair?  Rating(8)  2. Relation with others like being able to carry out your usual social activities and roles such as  activities at home, at work and in your community. Rating(8)  3. Enjoyment of life such that you have  been bothered by emotional problems such as feeling anxious, depressed or irritable?  Rating(10)

## 2020-11-22 NOTE — Telephone Encounter (Signed)
FYI----  IFrom: Eunice Blase, MD  Sent: 11/21/2020   1:24 PM EDT  To: Marlyne Beards, CMA  Subject: FCE                                             Please schedule consult with JN to discuss results of FCE and to go over options for back pain and returning to work.     I tried to call patient to schedule her to see Dr. Louanne Skye and she states that she does not want to at this time, she states that is not able to return to work with her body in the shape that it is in at this time.

## 2020-11-22 NOTE — Telephone Encounter (Signed)
Patient was only able to pick up a 7 day supply.  Patient needs a new script.  Prior authorization is not required according to covermymeds

## 2020-11-22 NOTE — Telephone Encounter (Signed)
Pt called requesting a call back from [facility] concerning a fitness test pt took. Pt is asking for a call back on phone number [phone].

## 2020-11-22 NOTE — Telephone Encounter (Signed)
I called the patient. She requested a copy of the FCE, for her own records. Coming tomorrow morning to pick this up.

## 2020-11-23 ENCOUNTER — Telehealth: Payer: Self-pay | Admitting: Family Medicine

## 2020-11-23 DIAGNOSIS — M5440 Lumbago with sciatica, unspecified side: Secondary | ICD-10-CM

## 2020-11-23 DIAGNOSIS — M5442 Lumbago with sciatica, left side: Secondary | ICD-10-CM

## 2020-11-23 DIAGNOSIS — G8929 Other chronic pain: Secondary | ICD-10-CM

## 2020-11-23 NOTE — Telephone Encounter (Signed)
Pt called with the decision to be seen by Dr. Ernestina Patches. Patient is asking for a call back to set an appt. Please send a referral to [facility] staff if needed. Pt phone number is [phone].

## 2020-11-23 NOTE — Telephone Encounter (Signed)
I called the patient again about the FCE after checking with my colleagues that deal with HIM --- she will need to go to [facility] on [address], where the FCE was done, to get a copy of the report. The patient voiced understanding.

## 2020-11-23 NOTE — Telephone Encounter (Signed)
Do I need to call Danella Sensing, NP before proceeding with neuropsych referral?

## 2020-11-24 ENCOUNTER — Encounter: Payer: Self-pay | Admitting: Physical Medicine and Rehabilitation

## 2020-11-24 NOTE — Progress Notes (Addendum)
Alisha Espinoza - 55 y.o. female MRN [id number]  Date of birth: 1966/02/23  Office Visit Note: Visit Date: 11/22/2020 PCP: Drue Flirt, MD Referred by: Drue Flirt, MD  Subjective: Chief Complaint  Patient presents with   Lower Back - Pain   Right Leg - Pain   Left Leg - Pain   HPI: Alisha Espinoza is a 55 y.o. female who comes in today At the request of Dr. Eunice Blase for evaluation and management for potential spinal cord stimulator trial implant.  She reports chronic and severe recalcitrant low back and bilateral hip and leg pain in a fairly classic L5 dermatome.  She reports this pain is an 8 out of 10 constant dull and aching pain worse with walking and bending and standing.  She gets some relief with medication and heat ice and rest.  Change of position does seem to help.  She is treated at [facility] for chronic pain.  She sees Leeroy Cha, MD and Danella Sensing, [place].  Their notes were fully reviewed.  She continues to take ibuprofen as well as 2 tablets of hydrocodone 5 mg daily receiving 60 tablets/month.  She also takes gabapentin as well as tizanidine.  She has been seeing Dr. Nolon Bussing and chiropractic care and he suggested the possibility of a spinal cord stimulator for her and she ultimately saw Dr. Junius Roads and now me.  Her spine history is that she has had a prior ACDF 1 level by Dr. Rolena Infante in 2014.  In 2020 she underwent an L3-L5 posterior lumbar interbody fusion by Dr. Sherley Bounds at [facility].  She has also undergone L2-3 laminectomy decompression without fusion.  She has had updated MRI of the lumbar spine in March of this year and this is reviewed with her today and reviewed below in the chart.  This shows some adjacent level disease at L2-3 as well as L5-S1 but without high-grade stenosis but some narrowing of the lateral recesses and subarticular areas bilaterally.  She has had no new trauma or other  red flag complaints of focal weakness or bowel or bladder changes etc.  Review of Systems  Musculoskeletal:  Positive for back pain and joint pain.       Bilateral hip and leg pain  All other systems reviewed and are negative. Otherwise per HPI.  Assessment & Plan: Visit Diagnoses:    ICD-10-CM   1. Lumbar radiculopathy  M54.16     2. Post laminectomy syndrome  M96.1     3. Chronic pain syndrome  G89.4        Plan: Findings:  Chronic, severe, recalcitrant bilateral hip and leg pain in a fairly classic L5 distribution status post PLIF from L3-L5 in 2020 by Dr. Sherley Bounds and continued pain management through [facility] where she is followed by Leeroy Cha, MD and their nurse practitioner.  She has really continued to have pretty debilitating pain despite good pain management approaches and physical therapy and surgery and chiropractic care.  I do think she would be a decent candidate for spinal cord stimulator trial which we can do in the office.  She meets all the criteria for this.  The next step would be to get this note over to Dr. Adam Phenix to see if it would be okay to undergo this procedure since she is under their care.  Second step would be to get a "pre" stimulator neuropsychological evaluation.  This could be done in their office although the timing traditionally has taken quite a while.  We do have a Dr. Arlana Pouch available who can usually get these done within a couple of weeks.  His office is in [place].  Patient is hopeful that this might be something that would help with her obviously.  She does want proceed.  I am going to have representatives from [employer] talk to her about the process as well and answer more questions.   Meds & Orders: No orders of the defined types were placed in this encounter.  No orders of the defined types were placed in this encounter.   Follow-up: Return if symptoms worsen or fail to improve.    Procedures: No procedures performed      Clinical History: MRI LUMBAR SPINE WITHOUT CONTRAST   TECHNIQUE: Multiplanar, multisequence MR imaging of the lumbar spine was performed. No intravenous contrast was administered.   COMPARISON:  05/20/2018   FINDINGS: Segmentation:  5 lumbar type vertebral bodies.   Alignment:  No malalignment.   Vertebrae:  Previous PLIF L3 through L5.   Conus medullaris and cauda equina: Conus extends to the T12-L1 level. Conus and cauda equina appear normal.   Paraspinal and other soft tissues: Normal   Disc levels:   No abnormality at T11-12, T12-L1 or L1-2.   L2-3: Moderate bulging of the disc. Bilateral facet and ligamentous hypertrophy. There is been previous posterior decompression at this level and there is sufficient patency of the central canal. There is stenosis of both lateral recesses and neural foramina, but without definite neural compression.   L3 through L5: Previous PLIF. Wide patency of the canal and foramina. No unexpected or complicating features.   L5-S1: Bulging of the disc. Mild facet and ligamentous hypertrophy. Mild narrowing of the subarticular lateral recesses and neural foramina, but without visible neural compression. Similar appearance to the study of February 2020.   IMPRESSION: 1. Good appearance in the fusion segment from L3 through L5. 2. L2-3: Previous posterior decompression, without hardware fusion or discectomy. Moderate bulging of the disc. Bilateral facet and ligamentous hypertrophy. Narrowing of the lateral recesses and neural foramina, but without definite neural compression. 3. L5-S1: Bulging of the disc. Mild facet and ligamentous hypertrophy. Mild narrowing of the subarticular lateral recesses and neural foramina, but without visible neural compression. Similar appearance to the study of February 2020.     Electronically Signed   By: Nelson Chimes M.D.   On: 06/15/2020 14:55   She  reports that she has been smoking cigarettes. She has a 15.00 pack-year smoking history. She has never used smokeless tobacco. No results for input(s): HGBA1C, LABURIC in the last 8760 hours.  Objective:  VS:  HT:    WT:   BMI:     BP:102/71  HR:87bpm  TEMP: ( )  RESP:  Physical Exam Vitals and nursing note reviewed.  Constitutional:      General: She is not in acute distress.    Appearance: Normal appearance. She is not ill-appearing.  HENT:     Head: Normocephalic and atraumatic.     Right Ear: External ear normal.     Left Ear: External ear normal.  Eyes:     Extraocular Movements: Extraocular movements intact.  Cardiovascular:     Rate and Rhythm: Normal rate.     Pulses: Normal pulses.  Pulmonary:     Effort: Pulmonary effort is normal. No respiratory distress.  Abdominal:     General: There  is no distension.     Palpations: Abdomen is soft.  Musculoskeletal:        General: Tenderness present.     Cervical back: Neck supple.     Right lower leg: No edema.     Left lower leg: No edema.     Comments: Patient is somewhat slow to rise from a seated position to full extension.  She is somewhat forward flexed lumbar spine.  There is well-healed surgical scar on the dorsum of the spine.  No pain with rocking over the vertebral bodies.  No real paraspinal pain although there are some taut bands paraspinally.  She does have some pain over the PSIS bilaterally.  No pain with hip rotation.  Some mild pain over the greater trochanters.  Good distal strength without any clonus.  Dysesthesias in L5 dermatome but good intact sensation.  Negative slump test.  Skin:    Findings: No erythema, lesion or rash.  Neurological:     General: No focal deficit present.     Mental Status: She is alert and oriented to person, place, and time.     Cranial Nerves: No cranial nerve deficit.     Sensory: No sensory deficit.     Motor: No weakness or abnormal muscle tone.     Coordination:  Coordination normal.     Gait: Gait normal.  Psychiatric:        Mood and Affect: Mood normal.        Behavior: Behavior normal.    Ortho Exam  Imaging: No results found.  Past Medical/Family/Surgical/Social History: Medications & Allergies reviewed per EMR, new medications updated. 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   Hemorrhoids 01/21/2019   Essential hypertension 01/21/2019   Serum calcium elevated 01/21/2019   Elevated cholesterol 01/19/2019   Diabetes mellitus ([place]) 01/19/2019   Trigger ring finger of left hand 01/11/2019   Trigger finger of right thumb 01/11/2019   S/P lumbar fusion 09/28/2018   Acute bronchitis 08/08/2015   Leukocytosis 08/08/2015   Hyperglycemia 08/08/2015   Tobacco abuse 08/08/2015   OTHER CHRONIC INFECTIVE OTITIS EXTERNA 04/27/2008   BACTERIAL PNEUMONIA, RIGHT LOWER LOBE 04/27/2008   LUMP OR MASS IN BREAST 04/27/2008   COUGH 04/27/2008   Past Medical History:  Diagnosis Date   Bronchitis    Diabetes mellitus ([place]) 01/19/2019   Dyspnea    Heart murmur    resolved "closed by age 75."    Hyperlipidemia    Hypertension    Seasonal allergies    Family History  Problem Relation Age of Onset   Diabetes Mother    Hypertension Father    Pancreatic cancer Sister    Colon cancer Neg Hx    Rectal cancer Neg Hx    Stomach cancer Neg Hx    Colon polyps Neg Hx    Esophageal cancer Neg Hx    Past Surgical History:  Procedure Laterality Date   ABDOMINAL HYSTERECTOMY  2000   ANTERIOR CERVICAL DECOMP/DISCECTOMY FUSION  03/19/2012   Procedure: ANTERIOR CERVICAL DECOMPRESSION/DISCECTOMY FUSION 1 LEVEL;  Surgeon: Melina Schools, MD;  Location: [place] [place];  Service: Orthopedics;  Laterality: Left;  Total Disc Replacement C4-5   ANTERIOR CERVICAL DECOMP/DISCECTOMY FUSION N/A 05/07/2012   Procedure: ANTERIOR CERVICAL DECOMPRESSION/DISCECTOMY FUSION 1 LEVEL/HARDWARE REMOVAL;  Surgeon: Melina Schools, MD;  Location: [place];  Service: Orthopedics;  Laterality: N/A;  REMOVAL OF CERVICAL DISC REPLACEMENT AND ACDF C4-5  BACK SURGERY     CARPAL TUNNEL RELEASE Right 09/28/2018   Procedure: CARPAL TUNNEL RELEASE;  Surgeon: Eustace Moore, MD;  Location: [place];  Service: Neurosurgery;  Laterality: Right;  CARPAL TUNNEL RELEASE   CERVICAL FUSION  05/07/2012   Dr Rolena Infante   COLONOSCOPY     CYSTECTOMY     L wrist   POLYPECTOMY     ROTATOR CUFF REPAIR     Right   TRIGGER FINGER RELEASE Left 05/03/2019   Procedure: LEFT LONG AND RING FINGER RELEASE TRIGGER FINGER/A-1 PULLEY;  Surgeon: Leanora Cover, MD;  Location: [place];  Service: Orthopedics;  Laterality: Left;  beir block   TRIGGER FINGER RELEASE Right 09/23/2019   Procedure: RELEASE TRIGGER FINGER/A-1 PULLEY RIGHT THUMB, LONG AND RING FINGERS;  Surgeon: Leanora Cover, MD;  Location: [facility];  Service: Orthopedics;  Laterality: Right;   TYMPANOSTOMY TUBE PLACEMENT     Social History   Occupational History   Not on file  Tobacco Use   Smoking status: Every Day    Packs/day: 0.50    Years: 30.00    Pack years: 15.00    Types: Cigarettes   Smokeless tobacco: Never   Tobacco comments:    10cigs/day  Vaping Use   Vaping Use: Never used  Substance and Sexual Activity   Alcohol use: Not Currently    Comment: occasionally   Drug use: No   Sexual activity: Yes    Birth control/protection: Surgical    Comment: Hysterectomy

## 2020-11-27 ENCOUNTER — Telehealth: Payer: Self-pay | Admitting: Family Medicine

## 2020-11-27 NOTE — Telephone Encounter (Signed)
Referral placed for Dr. Arlana Pouch.

## 2020-11-27 NOTE — Telephone Encounter (Signed)
Please advise 

## 2020-11-27 NOTE — Telephone Encounter (Signed)
Patient called needing an out of work note written for her. The number to contact patient is [phone]    emailed medical records release form per patient  request today

## 2020-11-27 NOTE — Telephone Encounter (Signed)
Since the patient is no longer under the care of Dr. Junius Roads, she is needing a new out-of-work note from one of the other doctors caring for her currently (her first appointment with Dr. Lorin Mercy is not until 12/29/20). Please see her last oow note from July. The patient would like a call to let her know when this is ready for pickup.

## 2020-11-28 ENCOUNTER — Encounter: Payer: Self-pay | Admitting: Physical Medicine and Rehabilitation

## 2020-11-28 NOTE — Telephone Encounter (Signed)
Work note placed at front desk and patient notified.

## 2020-11-29 ENCOUNTER — Other Ambulatory Visit: Payer: Self-pay

## 2020-11-29 ENCOUNTER — Encounter: Payer: Medicare Other | Attending: Physical Medicine and Rehabilitation | Admitting: Registered Nurse

## 2020-11-29 ENCOUNTER — Encounter: Payer: Self-pay | Admitting: Registered Nurse

## 2020-11-29 VITALS — BP 119/84 | HR 85 | Temp 98.4°F | Ht 66.0 in | Wt 206.0 lb

## 2020-11-29 DIAGNOSIS — M961 Postlaminectomy syndrome, not elsewhere classified: Secondary | ICD-10-CM | POA: Diagnosis present

## 2020-11-29 DIAGNOSIS — M2550 Pain in unspecified joint: Secondary | ICD-10-CM | POA: Diagnosis present

## 2020-11-29 DIAGNOSIS — M48062 Spinal stenosis, lumbar region with neurogenic claudication: Secondary | ICD-10-CM | POA: Diagnosis not present

## 2020-11-29 DIAGNOSIS — Z79891 Long term (current) use of opiate analgesic: Secondary | ICD-10-CM | POA: Diagnosis present

## 2020-11-29 DIAGNOSIS — M5416 Radiculopathy, lumbar region: Secondary | ICD-10-CM

## 2020-11-29 DIAGNOSIS — Z5181 Encounter for therapeutic drug level monitoring: Secondary | ICD-10-CM | POA: Diagnosis present

## 2020-11-29 DIAGNOSIS — G894 Chronic pain syndrome: Secondary | ICD-10-CM | POA: Diagnosis present

## 2020-11-29 NOTE — Progress Notes (Signed)
Subjective:    Patient ID: Alisha Espinoza, female    DOB: Sep 02, 1965, 55 y.o.   MRN: [id number]  HPI: Alisha Espinoza is a 55 y.o. female who returns for follow up appointment for chronic pain and medication refill. She states her pain is located in her lower back radiating into her bilateral lower extremities. Also reports generalized joint pain. She rates her pain 8. Her current exercise regime is walking, attending [employer] three days a week and performing stretching exercises.  Ms. Leake Morphine equivalent is 10.00 MME.  Last UDS was Performed on 07/19/2020, see note for details.    Pain Inventory Average Pain 7 Pain Right Now 8 My pain is sharp, dull, stabbing, tingling, and aching  In the last 24 hours, has pain interfered with the following? General activity 8 Relation with others 8 Enjoyment of life 10 What TIME of day is your pain at its worst? morning , daytime, evening, and night Sleep (in general) Poor  Pain is worse with: walking, bending, sitting, standing, and some activites Pain improves with: rest, heat/ice, pacing activities, and medication Relief from Meds:   Family History  Problem Relation Age of Onset   Diabetes Mother    Hypertension Father    Pancreatic cancer Sister    Colon cancer Neg Hx    Rectal cancer Neg Hx    Stomach cancer Neg Hx    Colon polyps Neg Hx    Esophageal cancer Neg Hx    Social History   Socioeconomic History   Marital status: Divorced    Spouse name: Not on file   Number of children: Not on file   Years of education: Not on file   Highest education level: Not on file  Occupational History   Not on file  Tobacco Use   Smoking status: Every Day    Packs/day: 0.50    Years: 30.00    Pack years: 15.00    Types: Cigarettes   Smokeless tobacco: Never   Tobacco comments:    10cigs/day  Vaping Use   Vaping Use: Never used  Substance and Sexual Activity   Alcohol use: Not Currently    Comment: occasionally    Drug use: No   Sexual activity: Yes    Birth control/protection: Surgical    Comment: Hysterectomy  Other Topics Concern   Not on file  Social History Narrative   Not on file   Social Determinants of Health   Financial Resource Strain: Not on file  Food Insecurity: Not on file  Transportation Needs: Not on file  Physical Activity: Not on file  Stress: Not on file  Social Connections: Not on file   Past Surgical History:  Procedure Laterality Date   ABDOMINAL HYSTERECTOMY  2000   ANTERIOR CERVICAL DECOMP/DISCECTOMY FUSION  03/19/2012   Procedure: ANTERIOR CERVICAL DECOMPRESSION/DISCECTOMY FUSION 1 LEVEL;  Surgeon: Melina Schools, MD;  Location: [facility] [facility];  Service: Orthopedics;  Laterality: Left;  Total Disc Replacement C4-5   ANTERIOR CERVICAL DECOMP/DISCECTOMY FUSION N/A 05/07/2012   Procedure: ANTERIOR CERVICAL DECOMPRESSION/DISCECTOMY FUSION 1 LEVEL/HARDWARE REMOVAL;  Surgeon: Melina Schools, MD;  Location: [place];  Service: Orthopedics;  Laterality: N/A;  REMOVAL OF CERVICAL DISC REPLACEMENT AND ACDF C4-5   BACK SURGERY     CARPAL TUNNEL RELEASE Right 09/28/2018   Procedure: CARPAL TUNNEL RELEASE;  Surgeon: Eustace Moore, MD;  Location: [place];  Service: Neurosurgery;  Laterality: Right;  CARPAL TUNNEL RELEASE   CERVICAL FUSION  05/07/2012  Dr Rolena Infante   COLONOSCOPY     CYSTECTOMY     L wrist   POLYPECTOMY     ROTATOR CUFF REPAIR     Right   TRIGGER FINGER RELEASE Left 05/03/2019   Procedure: LEFT LONG AND RING FINGER RELEASE TRIGGER FINGER/A-1 PULLEY;  Surgeon: Leanora Cover, MD;  Location: [place];  Service: Orthopedics;  Laterality: Left;  beir block   TRIGGER FINGER RELEASE Right 09/23/2019   Procedure: RELEASE TRIGGER FINGER/A-1 PULLEY RIGHT THUMB, LONG AND RING FINGERS;  Surgeon: Leanora Cover, MD;  Location: [place];  Service: Orthopedics;  Laterality: Right;   TYMPANOSTOMY TUBE PLACEMENT     Past Surgical History:  Procedure Laterality Date    ABDOMINAL HYSTERECTOMY  2000   ANTERIOR CERVICAL DECOMP/DISCECTOMY FUSION  03/19/2012   Procedure: ANTERIOR CERVICAL DECOMPRESSION/DISCECTOMY FUSION 1 LEVEL;  Surgeon: Melina Schools, MD;  Location: [place];  Service: Orthopedics;  Laterality: Left;  Total Disc Replacement C4-5   ANTERIOR CERVICAL DECOMP/DISCECTOMY FUSION N/A 05/07/2012   Procedure: ANTERIOR CERVICAL DECOMPRESSION/DISCECTOMY FUSION 1 LEVEL/HARDWARE REMOVAL;  Surgeon: Melina Schools, MD;  Location: [place];  Service: Orthopedics;  Laterality: N/A;  REMOVAL OF CERVICAL DISC REPLACEMENT AND ACDF C4-5   BACK SURGERY     CARPAL TUNNEL RELEASE Right 09/28/2018   Procedure: CARPAL TUNNEL RELEASE;  Surgeon: Eustace Moore, MD;  Location: [place];  Service: Neurosurgery;  Laterality: Right;  CARPAL TUNNEL RELEASE   CERVICAL FUSION  05/07/2012   Dr Rolena Infante   COLONOSCOPY     CYSTECTOMY     L wrist   POLYPECTOMY     ROTATOR CUFF REPAIR     Right   TRIGGER FINGER RELEASE Left 05/03/2019   Procedure: LEFT LONG AND RING FINGER RELEASE TRIGGER FINGER/A-1 PULLEY;  Surgeon: Leanora Cover, MD;  Location: [place];  Service: Orthopedics;  Laterality: Left;  beir block   TRIGGER FINGER RELEASE Right 09/23/2019   Procedure: RELEASE TRIGGER FINGER/A-1 PULLEY RIGHT THUMB, LONG AND RING FINGERS;  Surgeon: Leanora Cover, MD;  Location: [place];  Service: Orthopedics;  Laterality: Right;   TYMPANOSTOMY TUBE PLACEMENT     Past Medical History:  Diagnosis Date   Bronchitis    Diabetes mellitus ([place]) 01/19/2019   Dyspnea    Heart murmur    resolved "closed by age 71."    Hyperlipidemia    Hypertension    Seasonal allergies    BP 119/84   Pulse 85   Temp 98.4 F (36.9 C)   Ht '5\' 6"'$  (1.676 m)   Wt 206 lb (93.4 kg)   SpO2 97%   BMI 33.25 kg/m   Opioid Risk Score:   Fall Risk Score:  `1  Depression screen PHQ 2/9  Depression screen [facility] 2/9 10/24/2020 09/15/2020 07/19/2020 07/18/2020 03/02/2020 11/16/2019 10/08/2019   Decreased Interest 0 0 1 1 0 0 0  Down, Depressed, Hopeless 0 0 1 1 0 0 0  PHQ - 2 Score 0 0 2 2 0 0 0  Altered sleeping 0 - - - - - -  Tired, decreased energy - - - - - - -  Change in appetite - - - - - - -  Feeling bad or failure about yourself  - - - - - - -  Trouble concentrating - - - - - - -  Moving slowly or fidgety/restless - - - - - - -  Suicidal thoughts - - - - - - -  PHQ-9  Score 0 - - - - - -  Difficult doing work/chores - - - - - - -  Some recent data might be hidden    Review of Systems  Constitutional: Negative.   HENT: Negative.    Eyes: Negative.   Respiratory: Negative.    Cardiovascular: Negative.   Gastrointestinal: Negative.   Endocrine: Negative.   Genitourinary: Negative.   Musculoskeletal:  Positive for arthralgias, back pain, gait problem and myalgias.  Skin: Negative.   Allergic/Immunologic: Negative.   Hematological: Negative.   Psychiatric/Behavioral: Negative.    All other systems reviewed and are negative.     Objective:   Physical Exam Vitals and nursing note reviewed.  Constitutional:      Appearance: Normal appearance.  Cardiovascular:     Rate and Rhythm: Normal rate and regular rhythm.     Pulses: Normal pulses.     Heart sounds: Normal heart sounds.  Pulmonary:     Effort: Pulmonary effort is normal.     Breath sounds: Normal breath sounds.  Musculoskeletal:     Cervical back: Normal range of motion and neck supple.     Comments: Normal Muscle Bulk and Muscle Testing Reveals:  Upper Extremities: Full ROM and Muscle Strength 5/5 Lumbar Paraspinal Tenderness: L-3-L-5 Lower Extremities: Full ROM and Muscle Strength 5/5 Left Lower Extremity Flexion Produces Pain into her Left Lower Extremity. Arises from Table Slowly using cane for support Antalgic Gait     Skin:    General: Skin is warm and dry.  Neurological:     Mental Status: She is alert and oriented to person, place, and time.  Psychiatric:        Mood and Affect: Mood  normal.        Behavior: Behavior normal.         Assessment & Plan:  1. Lumbosacral Radiculopathy: Continue Gabapentin. Continue HEP as Tolerated. Continue to Monitor.11/29/2020 2. Lumbar Spinal Stenosis: Continue HEP as Tolerated. Continue current medication regimen. Continue to monitor.11/29/2020 3. Chronic Pain Syndrome: Refilled Hydrocodone 5/325 mg one tablet twice a day as needed for pain #60.  We will continue the opioid monitoring program, this consists of regular clinic visits, examinations, urine drug screen, pill counts as well as use of [place] Controlled Substance Reporting system. A 12 month History has been reviewed on the [place] Controlled Substance Reporting System on 11/29/2020 4. Polyarthralgia: Continue HEP as Tolerated. Continue to Monitor.   F/U in 1 month

## 2020-12-09 ENCOUNTER — Other Ambulatory Visit: Payer: Self-pay | Admitting: Physical Medicine and Rehabilitation

## 2020-12-19 ENCOUNTER — Encounter: Payer: Medicare Other | Attending: Physical Medicine and Rehabilitation | Admitting: Registered Nurse

## 2020-12-19 ENCOUNTER — Encounter: Payer: Self-pay | Admitting: Registered Nurse

## 2020-12-19 ENCOUNTER — Other Ambulatory Visit: Payer: Self-pay

## 2020-12-19 VITALS — BP 106/74 | HR 80 | Temp 97.8°F | Ht 66.0 in | Wt 204.2 lb

## 2020-12-19 DIAGNOSIS — M48062 Spinal stenosis, lumbar region with neurogenic claudication: Secondary | ICD-10-CM | POA: Diagnosis present

## 2020-12-19 DIAGNOSIS — M5416 Radiculopathy, lumbar region: Secondary | ICD-10-CM

## 2020-12-19 DIAGNOSIS — Z5181 Encounter for therapeutic drug level monitoring: Secondary | ICD-10-CM | POA: Diagnosis not present

## 2020-12-19 DIAGNOSIS — M961 Postlaminectomy syndrome, not elsewhere classified: Secondary | ICD-10-CM

## 2020-12-19 DIAGNOSIS — M2550 Pain in unspecified joint: Secondary | ICD-10-CM

## 2020-12-19 DIAGNOSIS — G894 Chronic pain syndrome: Secondary | ICD-10-CM | POA: Diagnosis not present

## 2020-12-19 DIAGNOSIS — Z79891 Long term (current) use of opiate analgesic: Secondary | ICD-10-CM

## 2020-12-19 MED ORDER — HYDROCODONE-ACETAMINOPHEN 5-325 MG PO TABS: 1.0000 | ORAL_TABLET | Freq: Two times a day (BID) | ORAL | 0 refills | Status: DC | PRN

## 2020-12-19 NOTE — Progress Notes (Signed)
Subjective:    Patient ID: Alisha Espinoza, female    DOB: [birth year], 55 y.o.   MRN: [id number]  HPI: Alisha Espinoza is a 55 y.o. female who returns for follow up appointment for chronic pain and medication refill. She states her pain is located in her  lower back radiating into her bilateral lower extremities .Also reports generalized pain. She rates her pain 8. Her current exercise regime is walking and performing stretching exercises.  [name] Morphine equivalent is 10.00  MME.   UDS ordered today.     Pain Inventory Average Pain 7 Pain Right Now 8 My pain is sharp, dull, stabbing, tingling, and aching  In the last 24 hours, has pain interfered with the following? General activity 7 Relation with others 10 Enjoyment of life 10 What TIME of day is your pain at its worst? morning , daytime, evening, and night Sleep (in general) Poor  Pain is worse with: walking, bending, sitting, standing, and some activites Pain improves with: rest, heat/ice, pacing activities, and medication Relief from Meds: 4  Family History  Problem Relation Age of Onset  . Diabetes Mother   . Hypertension Father   . Pancreatic cancer Sister   . Colon cancer Neg Hx   . Rectal cancer Neg Hx   . Stomach cancer Neg Hx   . Colon polyps Neg Hx   . Esophageal cancer Neg Hx    Social History   Socioeconomic History  . Marital status: Divorced    Spouse name: Not on file  . Number of children: Not on file  . Years of education: Not on file  . Highest education level: Not on file  Occupational History  . Not on file  Tobacco Use  . Smoking status: Every Day    Packs/day: 0.50    Years: 30.00    Pack years: 15.00    Types: Cigarettes  . Smokeless tobacco: Never  . Tobacco comments:    10cigs/day  Vaping Use  . Vaping Use: Never used  Substance and Sexual Activity  . Alcohol use: Not Currently    Comment: occasionally  . Drug use: No  . Sexual activity: Yes    Birth control/protection:  Surgical    Comment: Hysterectomy  Other Topics Concern  . Not on file  Social History Narrative  . Not on file   Social Determinants of Health   Financial Resource Strain: Not on file  Food Insecurity: Not on file  Transportation Needs: Not on file  Physical Activity: Not on file  Stress: Not on file  Social Connections: Not on file   Past Surgical History:  Procedure Laterality Date  . ABDOMINAL HYSTERECTOMY  2000  . ANTERIOR CERVICAL DECOMP/DISCECTOMY FUSION  03/19/2012   Procedure: ANTERIOR CERVICAL DECOMPRESSION/DISCECTOMY FUSION 1 LEVEL;  Surgeon: Melina Schools, MD;  Location: [place];  Service: Orthopedics;  Laterality: Left;  Total Disc Replacement C4-5  . ANTERIOR CERVICAL DECOMP/DISCECTOMY FUSION N/A 05/07/2012   Procedure: ANTERIOR CERVICAL DECOMPRESSION/DISCECTOMY FUSION 1 LEVEL/HARDWARE REMOVAL;  Surgeon: Melina Schools, MD;  Location: [place];  Service: Orthopedics;  Laterality: N/A;  REMOVAL OF CERVICAL DISC REPLACEMENT AND ACDF C4-5  . BACK SURGERY    . CARPAL TUNNEL RELEASE Right 09/28/2018   Procedure: CARPAL TUNNEL RELEASE;  Surgeon: Eustace Moore, MD;  Location: [place];  Service: Neurosurgery;  Laterality: Right;  CARPAL TUNNEL RELEASE  . CERVICAL FUSION  05/07/2012   Dr Rolena Infante  . COLONOSCOPY    . CYSTECTOMY  L wrist  . POLYPECTOMY    . ROTATOR CUFF REPAIR     Right  . TRIGGER FINGER RELEASE Left 05/03/2019   Procedure: LEFT LONG AND RING FINGER RELEASE TRIGGER FINGER/A-1 PULLEY;  Surgeon: Leanora Cover, MD;  Location: [facility];  Service: Orthopedics;  Laterality: Left;  beir block  . TRIGGER FINGER RELEASE Right 09/23/2019   Procedure: RELEASE TRIGGER FINGER/A-1 PULLEY RIGHT THUMB, LONG AND RING FINGERS;  Surgeon: Leanora Cover, MD;  Location: [place];  Service: Orthopedics;  Laterality: Right;  . TYMPANOSTOMY TUBE PLACEMENT     Past Surgical History:  Procedure Laterality Date  . ABDOMINAL HYSTERECTOMY  2000  . ANTERIOR  CERVICAL DECOMP/DISCECTOMY FUSION  03/19/2012   Procedure: ANTERIOR CERVICAL DECOMPRESSION/DISCECTOMY FUSION 1 LEVEL;  Surgeon: Melina Schools, MD;  Location: [place];  Service: Orthopedics;  Laterality: Left;  Total Disc Replacement C4-5  . ANTERIOR CERVICAL DECOMP/DISCECTOMY FUSION N/A 05/07/2012   Procedure: ANTERIOR CERVICAL DECOMPRESSION/DISCECTOMY FUSION 1 LEVEL/HARDWARE REMOVAL;  Surgeon: Melina Schools, MD;  Location: [place];  Service: Orthopedics;  Laterality: N/A;  REMOVAL OF CERVICAL DISC REPLACEMENT AND ACDF C4-5  . BACK SURGERY    . CARPAL TUNNEL RELEASE Right 09/28/2018   Procedure: CARPAL TUNNEL RELEASE;  Surgeon: Eustace Moore, MD;  Location: [place];  Service: Neurosurgery;  Laterality: Right;  CARPAL TUNNEL RELEASE  . CERVICAL FUSION  05/07/2012   Dr Rolena Infante  . COLONOSCOPY    . CYSTECTOMY     L wrist  . POLYPECTOMY    . ROTATOR CUFF REPAIR     Right  . TRIGGER FINGER RELEASE Left 05/03/2019   Procedure: LEFT LONG AND RING FINGER RELEASE TRIGGER FINGER/A-1 PULLEY;  Surgeon: Leanora Cover, MD;  Location: [facility];  Service: Orthopedics;  Laterality: Left;  beir block  . TRIGGER FINGER RELEASE Right 09/23/2019   Procedure: RELEASE TRIGGER FINGER/A-1 PULLEY RIGHT THUMB, LONG AND RING FINGERS;  Surgeon: Leanora Cover, MD;  Location: [place];  Service: Orthopedics;  Laterality: Right;  . TYMPANOSTOMY TUBE PLACEMENT     Past Medical History:  Diagnosis Date  . Bronchitis   . Diabetes mellitus ([place]) 01/19/2019  . Dyspnea   . Heart murmur    resolved "closed by age 32."   . Hyperlipidemia   . Hypertension   . Seasonal allergies    BP 106/74   Pulse 80   Temp 97.8 F (36.6 C)   Ht 5\' 6"  (1.676 m)   Wt 204 lb 3.2 oz (92.6 kg)   SpO2 95%   BMI 32.96 kg/m   Opioid Risk Score:   Fall Risk Score:  `1  Depression screen PHQ 2/9  Depression screen [facility] 2/9 12/19/2020 10/24/2020 09/15/2020 07/19/2020 07/18/2020 03/02/2020 11/16/2019  Decreased Interest 1 0  0 1 1 0 0  Down, Depressed, Hopeless 1 0 0 1 1 0 0  PHQ - 2 Score 2 0 0 2 2 0 0  Altered sleeping - 0 - - - - -  Tired, decreased energy - - - - - - -  Change in appetite - - - - - - -  Feeling bad or failure about yourself  - - - - - - -  Trouble concentrating - - - - - - -  Moving slowly or fidgety/restless - - - - - - -  Suicidal thoughts - - - - - - -  PHQ-9 Score - 0 - - - - -  Difficult doing work/chores - - - - - - -  Some recent data might be hidden     Review of Systems  Constitutional: Negative.   HENT: Negative.    Eyes: Negative.   Respiratory: Negative.    Cardiovascular: Negative.   Gastrointestinal: Negative.   Endocrine: Negative.   Genitourinary: Negative.   Musculoskeletal:  Positive for back pain.  Skin: Negative.   Allergic/Immunologic: Negative.   Neurological: Negative.   Hematological: Negative.   Psychiatric/Behavioral:  Positive for dysphoric mood.   All other systems reviewed and are negative.     Objective:   Physical Exam Vitals and nursing note reviewed.  Constitutional:      Appearance: Normal appearance.  Cardiovascular:     Rate and Rhythm: Normal rate and regular rhythm.     Pulses: Normal pulses.     Heart sounds: Normal heart sounds.  Pulmonary:     Effort: Pulmonary effort is normal.     Breath sounds: Normal breath sounds.  Musculoskeletal:     Cervical back: Normal range of motion and neck supple.     Comments: Normal Muscle Bulk and Muscle Testing Reveals:  Upper Extremities: Full ROM and Muscle Strength  5/5 ,Lumbar Paraspinal Tenderness: L-3-L-5 Lower Extremities: Full ROM and Muscle Strength 5/5 Arises from table with ease Narrow Based Gait     Skin:    General: Skin is warm and dry.  Neurological:     Mental Status: She is alert and oriented to person, place, and time.  Psychiatric:        Mood and Affect: Mood normal.        Behavior: Behavior normal.         Assessment & Plan:  1. Lumbosacral  Radiculopathy: Continue Gabapentin. Continue HEP as Tolerated. Continue to Monitor.12/19/2020 2. Lumbar Spinal Stenosis: Continue HEP as Tolerated. Continue current medication regimen. Continue to monitor.09/31/2022 3. Chronic Pain Syndrome: Refilled Hydrocodone 5/325 mg one tablet twice a day as needed for pain #60.  We will continue the opioid monitoring program, this consists of regular clinic visits, examinations, urine drug screen, pill counts as well as use of [place] Controlled Substance Reporting system. A 12 month History has been reviewed on the [place] Controlled Substance Reporting System on 11/29/2020 4. Polyarthralgia: Continue HEP as Tolerated. Continue to Monitor.    F/U in 1 month

## 2020-12-23 LAB — TOXASSURE SELECT,+ANTIDEPR,UR

## 2020-12-25 ENCOUNTER — Telehealth: Payer: Self-pay | Admitting: *Deleted

## 2020-12-25 NOTE — Telephone Encounter (Signed)
Urine drug screen for this encounter is consistent for prescribed medication 

## 2020-12-29 ENCOUNTER — Ambulatory Visit: Payer: Medicare Other | Admitting: Orthopaedic Surgery

## 2020-12-29 ENCOUNTER — Telehealth: Payer: Self-pay | Admitting: Physical Medicine and Rehabilitation

## 2020-12-29 NOTE — Telephone Encounter (Signed)
Patient called. She would like Dr. Romona Curls nurse to call her. Has some questions. [phone]

## 2020-12-29 NOTE — Telephone Encounter (Signed)
Called patient to advise that we have submitted for SCS trial authorization and will call her to schedule when it has been authorized.

## 2021-01-01 ENCOUNTER — Other Ambulatory Visit: Payer: Self-pay

## 2021-01-01 ENCOUNTER — Ambulatory Visit
Admission: RE | Admit: 2021-01-01 | Discharge: 2021-01-01 | Disposition: A | Discharge status: Home / Self Care | Payer: Medicare Other | Source: Ambulatory Visit | Attending: Family Medicine | Admitting: Family Medicine

## 2021-01-01 DIAGNOSIS — Z1231 Encounter for screening mammogram for malignant neoplasm of breast: Secondary | ICD-10-CM

## 2021-01-02 ENCOUNTER — Encounter: Payer: Self-pay | Admitting: Pulmonary Disease

## 2021-01-02 ENCOUNTER — Ambulatory Visit (INDEPENDENT_AMBULATORY_CARE_PROVIDER_SITE_OTHER): Payer: Medicare Other | Admitting: Pulmonary Disease

## 2021-01-02 VITALS — BP 130/90 | HR 89 | Temp 98.1°F | Ht 66.0 in | Wt 201.6 lb

## 2021-01-02 DIAGNOSIS — R9389 Abnormal findings on diagnostic imaging of other specified body structures: Secondary | ICD-10-CM

## 2021-01-02 MED ORDER — ALBUTEROL SULFATE HFA 108 (90 BASE) MCG/ACT IN AERS: 2.0000 | INHALATION_SPRAY | RESPIRATORY_TRACT | 1 refills | Status: DC | PRN

## 2021-01-02 MED ORDER — TRELEGY ELLIPTA 100-62.5-25 MCG/INH IN AEPB: 1.0000 | INHALATION_SPRAY | Freq: Every day | RESPIRATORY_TRACT | 3 refills | Status: DC

## 2021-01-02 NOTE — Progress Notes (Signed)
Alisha Espinoza    [id number]    1965/12/08  Primary Care Physician:Robinson, Blanche East, MD  Referring Physician: Drue Flirt, MD [phone],  [place] [address]  Chief complaint:   Patient being seen for shortness of breath and wheezing  HPI:  Shortness of breath and wheezing for many months Usually worse whenever she is smoking  Evaluation in the ED in March 2022 for shortness of breath and wheezing CT scan did show some groundglass changes, emphysema  Less than pack-a-day smoker  History of hypertension, diabetes, hypercholesterolemia -Optimizing treatment  Uses albuterol as needed  No pertinent occupational history Outpatient Encounter Medications as of 01/02/2021  Medication Sig   albuterol (PROVENTIL HFA;VENTOLIN HFA) 108 (90 Base) MCG/ACT inhaler Inhale 1-2 puffs into the lungs every 4 (four) hours as needed for wheezing or shortness of breath (or coughing).   Blood Glucose Monitoring Suppl (CONTOUR NEXT MONITOR) w/Device KIT 1 each by Does not apply route daily. Use to test blood sugars 1-2 times daily.   gabapentin (NEURONTIN) 400 MG capsule TAKE 1 CAPSULE(400 MG) BY MOUTH THREE TIMES DAILY   glucose blood (CONTOUR NEXT TEST) test strip Use to test 1-2 times daily.   HYDROcodone-acetaminophen (NORCO/VICODIN) 5-325 MG tablet Take 1 tablet by mouth 2 (two) times daily as needed for moderate pain.   ibuprofen (ADVIL) 800 MG tablet TAKE 1 TABLET(800 MG) BY MOUTH EVERY 8 HOURS AS NEEDED FOR MODERATE PAIN   LINZESS 290 MCG CAPS capsule TAKE 1 CAPSULE(290 MCG) BY MOUTH DAILY BEFORE BREAKFAST   metFORMIN (GLUCOPHAGE) 500 MG tablet Take 500 mg by mouth 2 (two) times daily.   Microlet Lancets MISC USE TO TEST BLOOD SUGARS ONCE TO BID   simvastatin (ZOCOR) 20 MG tablet Take 20 mg by mouth daily.   tretinoin (RETIN-A) 0.1 % cream Apply 1 application topically at bedtime.    valsartan-hydrochlorothiazide (DIOVAN-HCT) 160-12.5 MG tablet Take 1 tablet  by mouth daily.   No facility-administered encounter medications on file as of 01/02/2021.    Allergies as of 01/02/2021 - Review Complete 01/02/2021  Allergen Reaction Noted   Meloxicam Hives 05/16/2011   Mobic [meloxicam]  05/16/2011    Past Medical History:  Diagnosis Date   Bronchitis    Diabetes mellitus ([place]) 01/19/2019   Dyspnea    Heart murmur    resolved "closed by age 23."    Hyperlipidemia    Hypertension    Seasonal allergies     Past Surgical History:  Procedure Laterality Date   ABDOMINAL HYSTERECTOMY  2000   ANTERIOR CERVICAL DECOMP/DISCECTOMY FUSION  03/19/2012   Procedure: ANTERIOR CERVICAL DECOMPRESSION/DISCECTOMY FUSION 1 LEVEL;  Surgeon: Melina Schools, MD;  Location: [facility];  Service: Orthopedics;  Laterality: Left;  Total Disc Replacement C4-5   ANTERIOR CERVICAL DECOMP/DISCECTOMY FUSION N/A 05/07/2012   Procedure: ANTERIOR CERVICAL DECOMPRESSION/DISCECTOMY FUSION 1 LEVEL/HARDWARE REMOVAL;  Surgeon: Melina Schools, MD;  Location: [place];  Service: Orthopedics;  Laterality: N/A;  REMOVAL OF CERVICAL DISC REPLACEMENT AND ACDF C4-5   BACK SURGERY     CARPAL TUNNEL RELEASE Right 09/28/2018   Procedure: CARPAL TUNNEL RELEASE;  Surgeon: Eustace Moore, MD;  Location: [facility];  Service: Neurosurgery;  Laterality: Right;  CARPAL TUNNEL RELEASE   CERVICAL FUSION  05/07/2012   Dr Rolena Infante   COLONOSCOPY     CYSTECTOMY     L wrist   POLYPECTOMY     ROTATOR CUFF REPAIR  Right   TRIGGER FINGER RELEASE Left 05/03/2019   Procedure: LEFT LONG AND RING FINGER RELEASE TRIGGER FINGER/A-1 PULLEY;  Surgeon: Leanora Cover, MD;  Location: [facility];  Service: Orthopedics;  Laterality: Left;  beir block   TRIGGER FINGER RELEASE Right 09/23/2019   Procedure: RELEASE TRIGGER FINGER/A-1 PULLEY RIGHT THUMB, LONG AND RING FINGERS;  Surgeon: Leanora Cover, MD;  Location: [facility];  Service: Orthopedics;  Laterality: Right;   TYMPANOSTOMY TUBE PLACEMENT       Family History  Problem Relation Age of Onset   Diabetes Mother    Hypertension Father    Pancreatic cancer Sister    Colon cancer Neg Hx    Rectal cancer Neg Hx    Stomach cancer Neg Hx    Colon polyps Neg Hx    Esophageal cancer Neg Hx     Social History   Socioeconomic History   Marital status: Divorced    Spouse name: Not on file   Number of children: Not on file   Years of education: Not on file   Highest education level: Not on file  Occupational History   Not on file  Tobacco Use   Smoking status: Every Day    Packs/day: 0.50    Years: 30.00    Pack years: 15.00    Types: Cigarettes   Smokeless tobacco: Never   Tobacco comments:    10cigs/day  Vaping Use   Vaping Use: Never used  Substance and Sexual Activity   Alcohol use: Not Currently    Comment: occasionally   Drug use: No   Sexual activity: Yes    Birth control/protection: Surgical    Comment: Hysterectomy  Other Topics Concern   Not on file  Social History Narrative   Not on file   Social Determinants of Health   Financial Resource Strain: Not on file  Food Insecurity: Not on file  Transportation Needs: Not on file  Physical Activity: Not on file  Stress: Not on file  Social Connections: Not on file  Intimate Partner Violence: Not on file    Review of Systems  Respiratory:  Positive for cough and shortness of breath.   Psychiatric/Behavioral:  Negative for sleep disturbance.    Vitals:   01/02/21 1327  BP: 130/90  Pulse: 89  Temp: 98.1 F (36.7 C)  SpO2: 94%     Physical Exam Constitutional:      Appearance: She is obese.  HENT:     Head: Normocephalic.     Right Ear: Tympanic membrane normal.     Nose: No congestion.     Mouth/Throat:     Mouth: Mucous membranes are moist.  Eyes:     Pupils: Pupils are equal, round, and reactive to light.  Cardiovascular:     Rate and Rhythm: Normal rate and regular rhythm.     Heart sounds: No murmur heard.   No friction rub.   Pulmonary:     Effort: No respiratory distress.     Breath sounds: No stridor. No wheezing or rhonchi.  Musculoskeletal:     Cervical back: No rigidity or tenderness.  Neurological:     Mental Status: She is alert.  Psychiatric:        Mood and Affect: Mood normal.   Data Reviewed: She had a CT scan performed in March showing groundglass changes, emphysema-report noted in care everywhere   Assessment:  Chronic obstructive pulmonary disease  Wheezing  Active smoker  Shortness of breath on  exertion  Inhaler technique was reviewed  Plan/Recommendations:  Reinitiate Trelegy  Albuterol as needed  Obtain a CT scan of the chest to compare with recent CT, extent of emphysema and interstitial process noted on previous CT  Schedule patient for pulmonary function test  Smoking cessation counseling  Tentative follow-up in 2 to 3 months  I spent 45 minutes dedicated to the care of this patient on the date of this encounter to include previsit review of records, face-to-face time with the patient discussing conditions above, post visit ordering of testing, clinical documentation with electronic health record and communicated necessary findings to members of the patient's care team   Sherrilyn Rist MD [facility] 01/02/2021, 1:38 PM  CC: Drue Flirt, MD

## 2021-01-02 NOTE — Patient Instructions (Addendum)
we will get a breathing study  CT scan of the chest without contrast -To follow-up on the 1 you had done in March that showed some haziness in the lungs/emphysema  Prescription for Trelegy will be sent into pharmacy  Will renew your albuterol  Continue to work on quitting smoking chronic obstructive pulmonary disease    I will see you in 2 to 3 months

## 2021-01-05 ENCOUNTER — Other Ambulatory Visit: Payer: Self-pay

## 2021-01-05 ENCOUNTER — Ambulatory Visit (INDEPENDENT_AMBULATORY_CARE_PROVIDER_SITE_OTHER): Payer: Medicare Other | Admitting: Orthopaedic Surgery

## 2021-01-05 ENCOUNTER — Encounter: Payer: Self-pay | Admitting: Orthopaedic Surgery

## 2021-01-05 VITALS — BP 114/76 | Ht 66.0 in | Wt 204.0 lb

## 2021-01-05 DIAGNOSIS — Z981 Arthrodesis status: Secondary | ICD-10-CM

## 2021-01-08 ENCOUNTER — Other Ambulatory Visit: Payer: Self-pay | Admitting: Family Medicine

## 2021-01-08 ENCOUNTER — Other Ambulatory Visit: Payer: Self-pay | Admitting: Orthopedic Surgery

## 2021-01-08 DIAGNOSIS — R928 Other abnormal and inconclusive findings on diagnostic imaging of breast: Secondary | ICD-10-CM

## 2021-01-12 ENCOUNTER — Ambulatory Visit (HOSPITAL_COMMUNITY): Payer: Medicare Other

## 2021-01-15 NOTE — Progress Notes (Signed)
Office Visit Note   Patient: Alisha Espinoza           Date of Birth: 1966/02/28           MRN: [id number] Visit Date: 01/05/2021              Requested by: Drue Flirt, MD [phone],  [place] [address] PCP: Drue Flirt, MD   Assessment & Plan: Visit Diagnoses:  1. S/P lumbar fusion     Plan: We discussed lumbar disc degeneration.  Fusion appears solid.  Pending spinal cord stimulator see if this gives her significant improvement in her pain.  We discussed adjacent segment surgery if all other options are unsuccessful.  Follow-Up Instructions: No follow-ups on file.   Orders:  No orders of the defined types were placed in this encounter.  No orders of the defined types were placed in this encounter.     Procedures: No procedures performed   Clinical Data: No additional findings.   Subjective: Chief Complaint  Patient presents with   Lower Back - Pain    HPI 55 year old female with low back pain radiates into both legs.  States pain comes and goes sometimes one leg is worse than the other.  She said pain for 2 years at least.  She had previous injections in the past which helped for a day or 2 with Dr. Ernestina Patches.  She states she is awaiting approval for a spinal cord stimulator.  She has seen Dr. Guadalupe Dawn in the past and is looking for a new PCP.  She states she cannot sit very long.  She has used hydrocodone also ibuprofen which helps to some degree.   IMPRESSION: 1. Good appearance in the fusion segment from L3 through L5. 2. L2-3: Previous posterior decompression, without hardware fusion or discectomy. Moderate bulging of the disc. Bilateral facet and ligamentous hypertrophy. Narrowing of the lateral recesses and neural foramina, but without definite neural compression. 3. L5-S1: Bulging of the disc. Mild facet and ligamentous hypertrophy. Mild narrowing of the subarticular lateral recesses and neural foramina, but without visible neural  compression. Similar appearance to the study of February 2020.     Electronically Signed   By: Nelson Chimes M.D.   On: 06/15/2020 14:55 MRI skin marker findings are as above.  Fusion from L3-L5 some bulge at L5-S1. Review of Systems all the systems updated unchanged.   Objective: Vital Signs: BP 114/76   Ht 5\' 6"  (1.676 m)   Wt 204 lb (92.5 kg)   BMI 32.93 kg/m   Physical Exam Constitutional:      Appearance: She is well-developed.  HENT:     Head: Normocephalic.     Right Ear: External ear normal.     Left Ear: External ear normal. There is no impacted cerumen.  Eyes:     Pupils: Pupils are equal, round, and reactive to light.  Neck:     Thyroid: No thyromegaly.     Trachea: No tracheal deviation.  Cardiovascular:     Rate and Rhythm: Normal rate.  Pulmonary:     Effort: Pulmonary effort is normal.  Abdominal:     Palpations: Abdomen is soft.  Musculoskeletal:     Cervical back: No rigidity.  Skin:    General: Skin is warm and dry.  Neurological:     Mental Status: She is alert and oriented to person, place, and time.  Psychiatric:        Behavior: Behavior normal.  Ortho Exam pulses are intact negative logroll the hips negative straight leg raising 90 degrees positive popliteal compression test with mild discomfort.  Well-healed lumbar incision.  Specialty Comments:  No specialty comments available.  Imaging: No results found.   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   Hemorrhoids 01/21/2019   Essential hypertension 01/21/2019   Serum calcium elevated 01/21/2019   Elevated cholesterol 01/19/2019   Diabetes mellitus ([place]) 01/19/2019   Trigger ring finger of left hand 01/11/2019   Trigger finger of right thumb 01/11/2019   S/P lumbar fusion 09/28/2018   Acute bronchitis 08/08/2015   Leukocytosis 08/08/2015   Hyperglycemia 08/08/2015   Tobacco abuse 08/08/2015   OTHER  CHRONIC INFECTIVE OTITIS EXTERNA 04/27/2008   BACTERIAL PNEUMONIA, RIGHT LOWER LOBE 04/27/2008   LUMP OR MASS IN BREAST 04/27/2008   COUGH 04/27/2008   Past Medical History:  Diagnosis Date   Bronchitis    Diabetes mellitus ([place]) 01/19/2019   Dyspnea    Heart murmur    resolved "closed by age 66."    Hyperlipidemia    Hypertension    Seasonal allergies     Family History  Problem Relation Age of Onset   Diabetes Mother    Hypertension Father    Pancreatic cancer Sister    Colon cancer Neg Hx    Rectal cancer Neg Hx    Stomach cancer Neg Hx    Colon polyps Neg Hx    Esophageal cancer Neg Hx     Past Surgical History:  Procedure Laterality Date   ABDOMINAL HYSTERECTOMY  2000   ANTERIOR CERVICAL DECOMP/DISCECTOMY FUSION  03/19/2012   Procedure: ANTERIOR CERVICAL DECOMPRESSION/DISCECTOMY FUSION 1 LEVEL;  Surgeon: Melina Schools, MD;  Location: [facility];  Service: Orthopedics;  Laterality: Left;  Total Disc Replacement C4-5   ANTERIOR CERVICAL DECOMP/DISCECTOMY FUSION N/A 05/07/2012   Procedure: ANTERIOR CERVICAL DECOMPRESSION/DISCECTOMY FUSION 1 LEVEL/HARDWARE REMOVAL;  Surgeon: Melina Schools, MD;  Location: [facility];  Service: Orthopedics;  Laterality: N/A;  REMOVAL OF CERVICAL DISC REPLACEMENT AND ACDF C4-5   BACK SURGERY     CARPAL TUNNEL RELEASE Right 09/28/2018   Procedure: CARPAL TUNNEL RELEASE;  Surgeon: Eustace Moore, MD;  Location: [facility];  Service: Neurosurgery;  Laterality: Right;  CARPAL TUNNEL RELEASE   CERVICAL FUSION  05/07/2012   Dr Rolena Infante   COLONOSCOPY     CYSTECTOMY     L wrist   POLYPECTOMY     ROTATOR CUFF REPAIR     Right   TRIGGER FINGER RELEASE Left 05/03/2019   Procedure: LEFT LONG AND RING FINGER RELEASE TRIGGER FINGER/A-1 PULLEY;  Surgeon: Leanora Cover, MD;  Location: [place] [facility];  Service: Orthopedics;  Laterality: Left;  beir block   TRIGGER FINGER RELEASE Right 09/23/2019   Procedure: RELEASE TRIGGER FINGER/A-1 PULLEY RIGHT THUMB, LONG AND  RING FINGERS;  Surgeon: Leanora Cover, MD;  Location: [facility];  Service: Orthopedics;  Laterality: Right;   TYMPANOSTOMY TUBE PLACEMENT     Social History   Occupational History   Not on file  Tobacco Use   Smoking status: Every Day    Packs/day: 0.50    Years: 30.00    Pack years: 15.00    Types: Cigarettes   Smokeless tobacco: Never   Tobacco comments:    10cigs/day  Vaping Use   Vaping Use: Never used  Substance and Sexual Activity   Alcohol use: Not  Currently    Comment: occasionally   Drug use: No   Sexual activity: Yes    Birth control/protection: Surgical    Comment: Hysterectomy

## 2021-01-16 ENCOUNTER — Ambulatory Visit (HOSPITAL_COMMUNITY): Payer: Medicare Other

## 2021-01-17 ENCOUNTER — Encounter: Payer: Medicare Other | Admitting: Registered Nurse

## 2021-01-17 ENCOUNTER — Telehealth: Payer: Self-pay | Admitting: Physical Medicine and Rehabilitation

## 2021-01-17 DIAGNOSIS — F411 Generalized anxiety disorder: Secondary | ICD-10-CM

## 2021-01-17 NOTE — Telephone Encounter (Signed)
Patient is scheduled for SCS Trial on 11/9. Pharmacy for Valium is [facility] in [address] and [address]. There are 2 [employer] listed.

## 2021-01-23 ENCOUNTER — Ambulatory Visit (HOSPITAL_COMMUNITY): Payer: Medicare Other

## 2021-01-23 ENCOUNTER — Telehealth: Payer: Self-pay | Admitting: Pulmonary Disease

## 2021-01-23 NOTE — Telephone Encounter (Signed)
Cancel CT scan of her chest secondary to having the flu  Needs CT scan rescheduled Prefer for her to definitely have a CT prior to being seen in follow-up

## 2021-01-24 ENCOUNTER — Other Ambulatory Visit: Payer: Medicare Other

## 2021-01-24 ENCOUNTER — Ambulatory Visit: Payer: Medicare Other | Admitting: Physical Medicine and Rehabilitation

## 2021-01-25 ENCOUNTER — Encounter: Payer: Self-pay | Admitting: Registered Nurse

## 2021-01-25 ENCOUNTER — Encounter: Payer: Medicare Other | Attending: Physical Medicine and Rehabilitation | Admitting: Registered Nurse

## 2021-01-25 ENCOUNTER — Other Ambulatory Visit: Payer: Self-pay

## 2021-01-25 ENCOUNTER — Telehealth: Payer: Medicare Other | Admitting: Physician Assistant

## 2021-01-25 VITALS — Ht 66.0 in | Wt 204.0 lb

## 2021-01-25 DIAGNOSIS — M549 Dorsalgia, unspecified: Secondary | ICD-10-CM

## 2021-01-25 DIAGNOSIS — M48062 Spinal stenosis, lumbar region with neurogenic claudication: Secondary | ICD-10-CM | POA: Diagnosis not present

## 2021-01-25 DIAGNOSIS — M25511 Pain in right shoulder: Secondary | ICD-10-CM

## 2021-01-25 DIAGNOSIS — G894 Chronic pain syndrome: Secondary | ICD-10-CM

## 2021-01-25 DIAGNOSIS — M5416 Radiculopathy, lumbar region: Secondary | ICD-10-CM | POA: Diagnosis not present

## 2021-01-25 DIAGNOSIS — M961 Postlaminectomy syndrome, not elsewhere classified: Secondary | ICD-10-CM

## 2021-01-25 DIAGNOSIS — Z5181 Encounter for therapeutic drug level monitoring: Secondary | ICD-10-CM

## 2021-01-25 DIAGNOSIS — M2550 Pain in unspecified joint: Secondary | ICD-10-CM

## 2021-01-25 DIAGNOSIS — Z79891 Long term (current) use of opiate analgesic: Secondary | ICD-10-CM

## 2021-01-25 DIAGNOSIS — M25512 Pain in left shoulder: Secondary | ICD-10-CM | POA: Insufficient documentation

## 2021-01-25 DIAGNOSIS — M542 Cervicalgia: Secondary | ICD-10-CM

## 2021-01-25 MED ORDER — HYDROCODONE-ACETAMINOPHEN 5-325 MG PO TABS: 1.0000 | ORAL_TABLET | Freq: Two times a day (BID) | ORAL | 0 refills | Status: DC | PRN

## 2021-01-25 NOTE — Progress Notes (Signed)
Based on what you shared with me, I feel your condition warrants further evaluation and I recommend that you be seen in a face to face visit.  Giving severity and chronicity of symptoms along with symptoms concerning for nerve compression (numbness/tingling/weakness and radiation of pain into both legs) you need to be evaluated in person to get proper workup and ongoing treatment/management. This is above the scope of what we can do via e-visit or our video on demand visits through [facility].    NOTE: There will be NO CHARGE for this eVisit   If you are having a true medical emergency please call 911.      For an urgent face to face visit, [facility] has six urgent care centers for your convenience:     [facility] [place] [facility] [place] Get Driving Directions [phone] [place] [place], [place] [address]    [facility]) Get Driving Directions [phone] [place], [place] [address]  [facility] [place] [facility]) Get Driving Directions [phone] [address] [place],  [place]  [address]  [facility] Get Driving Directions [phone] [address], [place], [place] [address]   [facility] Get Driving Directions  [phone] [address] [place], [place] [address]   [facility] [place] Get Driving Directions [phone] [address] [place], [place] [address]  Your MyChart E-visit questionnaire answers were reviewed by a board certified advanced clinical practitioner to complete your personal care plan based on your specific symptoms.  Thank you for using e-Visits.

## 2021-01-25 NOTE — Progress Notes (Signed)
Subjective:    Patient ID: Alisha Espinoza, female    DOB: May 05, 1965, 55 y.o.   MRN: [id number]  HPI: Alisha Espinoza is a 55 y.o. female whose appointment was changed to a My-Chart Video visit, she called office stating she has the FLU. [name] agrees with My-Chart Video visit and verbalizes understanding. She states her pain is located in  her. Neck, bilateral shoulders, lower back pin radiating into her bilateral lower extremities occasionally. Also reports generalized joint pain. She rates her pain 7. Her current exercise regime is walking.  [name] Morphine equivalent is 10.00 MME.   Last UDS was Performed on 12/19/2020.it was consistent.    Pain Inventory Average Pain 7 Pain Right Now 7 My pain is sharp, burning, dull, stabbing, tingling, and aching  In the last 24 hours, has pain interfered with the following? General activity 7 Relation with others 7 Enjoyment of life 7 What TIME of day is your pain at its worst? morning , daytime, evening, and night Sleep (in general) Poor  Pain is worse with: walking, bending, sitting, standing, and some activites Pain improves with: rest, heat/ice, therapy/exercise, pacing activities, medication, and injections Relief from Meds: 4  Family History  Problem Relation Age of Onset   Diabetes Mother    Hypertension Father    Pancreatic cancer Sister    Colon cancer Neg Hx    Rectal cancer Neg Hx    Stomach cancer Neg Hx    Colon polyps Neg Hx    Esophageal cancer Neg Hx    Social History   Socioeconomic History   Marital status: Divorced    Spouse name: Not on file   Number of children: Not on file   Years of education: Not on file   Highest education level: Not on file  Occupational History   Not on file  Tobacco Use   Smoking status: Every Day    Packs/day: 0.50    Years: 30.00    Pack years: 15.00    Types: Cigarettes   Smokeless tobacco: Never   Tobacco comments:    10cigs/day  Vaping Use   Vaping Use: Never  used  Substance and Sexual Activity   Alcohol use: Not Currently    Comment: occasionally   Drug use: No   Sexual activity: Yes    Birth control/protection: Surgical    Comment: Hysterectomy  Other Topics Concern   Not on file  Social History Narrative   Not on file   Social Determinants of Health   Financial Resource Strain: Not on file  Food Insecurity: Not on file  Transportation Needs: Not on file  Physical Activity: Not on file  Stress: Not on file  Social Connections: Not on file   Past Surgical History:  Procedure Laterality Date   ABDOMINAL HYSTERECTOMY  2000   ANTERIOR CERVICAL DECOMP/DISCECTOMY FUSION  03/19/2012   Procedure: ANTERIOR CERVICAL DECOMPRESSION/DISCECTOMY FUSION 1 LEVEL;  Surgeon: Melina Schools, MD;  Location: [facility] [place];  Service: Orthopedics;  Laterality: Left;  Total Disc Replacement C4-5   ANTERIOR CERVICAL DECOMP/DISCECTOMY FUSION N/A 05/07/2012   Procedure: ANTERIOR CERVICAL DECOMPRESSION/DISCECTOMY FUSION 1 LEVEL/HARDWARE REMOVAL;  Surgeon: Melina Schools, MD;  Location: [place];  Service: Orthopedics;  Laterality: N/A;  REMOVAL OF CERVICAL DISC REPLACEMENT AND ACDF C4-5   BACK SURGERY     CARPAL TUNNEL RELEASE Right 09/28/2018   Procedure: CARPAL TUNNEL RELEASE;  Surgeon: Eustace Moore, MD;  Location: [place];  Service: Neurosurgery;  Laterality: Right;  CARPAL TUNNEL RELEASE   CERVICAL FUSION  05/07/2012   Dr Rolena Infante   COLONOSCOPY     CYSTECTOMY     L wrist   POLYPECTOMY     ROTATOR CUFF REPAIR     Right   TRIGGER FINGER RELEASE Left 05/03/2019   Procedure: LEFT LONG AND RING FINGER RELEASE TRIGGER FINGER/A-1 PULLEY;  Surgeon: Leanora Cover, MD;  Location: [facility];  Service: Orthopedics;  Laterality: Left;  beir block   TRIGGER FINGER RELEASE Right 09/23/2019   Procedure: RELEASE TRIGGER FINGER/A-1 PULLEY RIGHT THUMB, LONG AND RING FINGERS;  Surgeon: Leanora Cover, MD;  Location: [place];  Service: Orthopedics;  Laterality:  Right;   TYMPANOSTOMY TUBE PLACEMENT     Past Surgical History:  Procedure Laterality Date   ABDOMINAL HYSTERECTOMY  2000   ANTERIOR CERVICAL DECOMP/DISCECTOMY FUSION  03/19/2012   Procedure: ANTERIOR CERVICAL DECOMPRESSION/DISCECTOMY FUSION 1 LEVEL;  Surgeon: Melina Schools, MD;  Location: [place];  Service: Orthopedics;  Laterality: Left;  Total Disc Replacement C4-5   ANTERIOR CERVICAL DECOMP/DISCECTOMY FUSION N/A 05/07/2012   Procedure: ANTERIOR CERVICAL DECOMPRESSION/DISCECTOMY FUSION 1 LEVEL/HARDWARE REMOVAL;  Surgeon: Melina Schools, MD;  Location: [place];  Service: Orthopedics;  Laterality: N/A;  REMOVAL OF CERVICAL DISC REPLACEMENT AND ACDF C4-5   BACK SURGERY     CARPAL TUNNEL RELEASE Right 09/28/2018   Procedure: CARPAL TUNNEL RELEASE;  Surgeon: Eustace Moore, MD;  Location: [facility];  Service: Neurosurgery;  Laterality: Right;  CARPAL TUNNEL RELEASE   CERVICAL FUSION  05/07/2012   Dr Rolena Infante   COLONOSCOPY     CYSTECTOMY     L wrist   POLYPECTOMY     ROTATOR CUFF REPAIR     Right   TRIGGER FINGER RELEASE Left 05/03/2019   Procedure: LEFT LONG AND RING FINGER RELEASE TRIGGER FINGER/A-1 PULLEY;  Surgeon: Leanora Cover, MD;  Location: [place];  Service: Orthopedics;  Laterality: Left;  beir block   TRIGGER FINGER RELEASE Right 09/23/2019   Procedure: RELEASE TRIGGER FINGER/A-1 PULLEY RIGHT THUMB, LONG AND RING FINGERS;  Surgeon: Leanora Cover, MD;  Location: [place];  Service: Orthopedics;  Laterality: Right;   TYMPANOSTOMY TUBE PLACEMENT     Past Medical History:  Diagnosis Date   Bronchitis    Diabetes mellitus ([place]) 01/19/2019   Dyspnea    Heart murmur    resolved "closed by age 60."    Hyperlipidemia    Hypertension    Seasonal allergies    Ht 5\' 6"  (1.676 m)   Wt 204 lb (92.5 kg)   BMI 32.93 kg/m   Opioid Risk Score:   Fall Risk Score:  `1  Depression screen PHQ 2/9  Depression screen [facility] 2/9 12/19/2020 10/24/2020 09/15/2020 07/19/2020  07/18/2020 03/02/2020 11/16/2019  Decreased Interest 1 0 0 1 1 0 0  Down, Depressed, Hopeless 1 0 0 1 1 0 0  PHQ - 2 Score 2 0 0 2 2 0 0  Altered sleeping - 0 - - - - -  Tired, decreased energy - - - - - - -  Change in appetite - - - - - - -  Feeling bad or failure about yourself  - - - - - - -  Trouble concentrating - - - - - - -  Moving slowly or fidgety/restless - - - - - - -  Suicidal thoughts - - - - - - -  PHQ-9 Score - 0 - - - - -  Difficult doing work/chores - - - - - - -  Some recent data might be hidden      Review of Systems  Musculoskeletal:  Positive for back pain.  Psychiatric/Behavioral:  Positive for dysphoric mood.   All other systems reviewed and are negative.     Objective:   Physical Exam Vitals and nursing note reviewed.  Musculoskeletal:     Comments: No Physical Exam Performed: My-Chart Video Visit         Assessment & Plan:  1. Lumbosacral Radiculopathy: Continue Gabapentin. Continue HEP as Tolerated. Continue to Monitor.01/25/2021 2. Lumbar Spinal Stenosis: Continue HEP as Tolerated. Continue current medication regimen. Continue to monitor.01/25/2021 3. Chronic Pain Syndrome: Refilled Hydrocodone 5/325 mg one tablet twice a day as needed for pain #60.  We will continue the opioid monitoring program, this consists of regular clinic visits, examinations, urine drug screen, pill counts as well as use of [place] Controlled Substance Reporting system. A 12 month History has been reviewed on the [place] Controlled Substance Reporting System on 01/25/2021 4. Polyarthralgia: Continue HEP as Tolerated. Continue to Monitor. 01/25/2021 5. Cervicalgia: Continue HEP as Tolerated. Continue to monitor 6. Bilateral Shoulder Pain: Continue HEP as Tolerated . Continue to Monitor.    F/U in 1 month  My-Chart Video Visit Established Patient Location of Patient: Family Home Location of Provider: In the Office

## 2021-01-25 NOTE — Telephone Encounter (Signed)
I will call patient to resc appt

## 2021-01-26 ENCOUNTER — Ambulatory Visit: Payer: Medicare Other | Admitting: Physical Medicine and Rehabilitation

## 2021-01-29 ENCOUNTER — Telehealth: Payer: Self-pay | Admitting: Physical Medicine and Rehabilitation

## 2021-01-29 NOTE — Telephone Encounter (Signed)
Patient called asked if her Evaluation that was done by Dr. Lora Paula can be faxed to [facility]. Patient said the fax# is [phone]   The number to contact patient is [phone]

## 2021-01-30 ENCOUNTER — Other Ambulatory Visit: Payer: Self-pay | Admitting: Physical Medicine and Rehabilitation

## 2021-01-30 ENCOUNTER — Ambulatory Visit: Payer: Medicare Other | Admitting: Physical Medicine and Rehabilitation

## 2021-01-30 MED ORDER — DIAZEPAM 5 MG PO TABS: ORAL_TABLET | ORAL | 0 refills | Status: DC

## 2021-01-30 MED ORDER — DULOXETINE HCL 20 MG PO CPEP: 20.0000 mg | ORAL_CAPSULE | Freq: Every day | ORAL | 2 refills | Status: DC

## 2021-01-31 NOTE — Telephone Encounter (Signed)
Evaluation faxed.

## 2021-02-01 ENCOUNTER — Telehealth: Payer: Self-pay | Admitting: Pulmonary Disease

## 2021-02-01 NOTE — Progress Notes (Addendum)
Patient was seen by Dr. Ander Slade, pulmonologist last month for ongoing SOB and wheezing. Chest CT and PFT have been ordered but not performed yet (CT scan scheduled for 02/28/21). Dr. Sabra Heck, anesthesiologist, reviewed chart and states that patient needs pulmonary clearance prior to having surgery at [facility]. Notified  Hassan Rowan, surgery scheduler for Dr. Fredna Dow.  Also, patient was seen in ED on  0/25/22 and diagnosed with influenza. Symptoms must be cleared from this prior to sx as well.

## 2021-02-02 NOTE — Telephone Encounter (Signed)
Will send over to [facility] to have this form completed.  thanks

## 2021-02-05 ENCOUNTER — Telehealth: Payer: Self-pay | Admitting: Pulmonary Disease

## 2021-02-05 NOTE — Telephone Encounter (Signed)
Fax received from [facility] Dr. Leanora Cover to perform a A-1 pulley release right small finger on patient.  Patient needs surgery clearance. Patient was seen on 01/02/2021. Office protocol is a risk assessment can be sent to surgeon if patient has been seen in 60 days or less.   Sending to Dr. Ander Slade for risk assessment or recommendations if patient needs to be seen in office prior to surgical procedure.  Please addend OV notes from 01/02/21 or send message back to [facility] or Procedure pool

## 2021-02-05 NOTE — Telephone Encounter (Signed)
ATC patient to get her scheduled for surgical clearance per Dr. Ander Slade, [facility]   When calls back please schedule her for an appt with APP ASAP but before the 11th

## 2021-02-05 NOTE — Telephone Encounter (Signed)
Separate message has been sent to Dr. Ander Slade about clearance. Please see other encounter for updates. Will close this encounter.

## 2021-02-05 NOTE — Telephone Encounter (Signed)
Underlying lung disease is not significant enough to preclude surgery but I see that she was recently seen in the emergency room for possible flu on 10/25 -she was having coughing, shortness of breath, fever  Need to make sure that symptoms have cleared prior to undergoing surgery  -If symptoms have completely resolved, -Last visit in the office does not preclude surgery  -May be scheduled to follow-up prior to surgery if needed-with myself or an APP if symptoms have not

## 2021-02-06 NOTE — Telephone Encounter (Signed)
Patient has been scheduled for an appt for 02/07/2021 at 11:30. Will fax form and OV note after appt

## 2021-02-07 ENCOUNTER — Ambulatory Visit: Payer: Self-pay

## 2021-02-07 ENCOUNTER — Other Ambulatory Visit: Payer: Self-pay

## 2021-02-07 ENCOUNTER — Encounter: Payer: Self-pay | Admitting: Physical Medicine and Rehabilitation

## 2021-02-07 ENCOUNTER — Ambulatory Visit: Payer: Medicare Other | Admitting: Adult Health

## 2021-02-07 ENCOUNTER — Ambulatory Visit (INDEPENDENT_AMBULATORY_CARE_PROVIDER_SITE_OTHER): Payer: Medicare Other | Admitting: Physical Medicine and Rehabilitation

## 2021-02-07 VITALS — BP 145/102 | HR 80

## 2021-02-07 DIAGNOSIS — M5416 Radiculopathy, lumbar region: Secondary | ICD-10-CM

## 2021-02-07 DIAGNOSIS — M961 Postlaminectomy syndrome, not elsewhere classified: Secondary | ICD-10-CM | POA: Diagnosis not present

## 2021-02-07 DIAGNOSIS — G894 Chronic pain syndrome: Secondary | ICD-10-CM

## 2021-02-07 NOTE — Telephone Encounter (Signed)
Called and spoke with patient because she no showed for her appt today for surgical clearance. I was able to get her rescheduled for 02/14/2021 with Dr. Ander Slade and it looks like they have moved back her surgery to 02/26/2021.

## 2021-02-07 NOTE — Progress Notes (Signed)
No driver- states that she can get her niece to pick her up. Took valium. Right leg and buttock pain. Pain in leg is in the back and front of entire leg, and in the hip. Numeric Pain Rating Scale and Functional Assessment Average Pain 10   In the last MONTH (on 0-10 scale) has pain interfered with the following?  1. General activity like being  able to carry out your everyday physical activities such as walking, climbing stairs, carrying groceries, or moving a chair?  Rating(10)   -Driver, -BT

## 2021-02-14 ENCOUNTER — Ambulatory Visit (INDEPENDENT_AMBULATORY_CARE_PROVIDER_SITE_OTHER): Payer: Medicare Other

## 2021-02-14 ENCOUNTER — Ambulatory Visit (INDEPENDENT_AMBULATORY_CARE_PROVIDER_SITE_OTHER): Payer: Medicare Other | Admitting: Physical Medicine and Rehabilitation

## 2021-02-14 ENCOUNTER — Encounter: Payer: Self-pay | Admitting: Pulmonary Disease

## 2021-02-14 ENCOUNTER — Ambulatory Visit (INDEPENDENT_AMBULATORY_CARE_PROVIDER_SITE_OTHER): Payer: Medicare Other | Admitting: Pulmonary Disease

## 2021-02-14 ENCOUNTER — Other Ambulatory Visit: Payer: Self-pay

## 2021-02-14 VITALS — BP 138/74 | HR 81 | Temp 97.9°F | Ht 66.0 in | Wt 201.8 lb

## 2021-02-14 DIAGNOSIS — Z01818 Encounter for other preprocedural examination: Secondary | ICD-10-CM

## 2021-02-14 DIAGNOSIS — G894 Chronic pain syndrome: Secondary | ICD-10-CM

## 2021-02-14 DIAGNOSIS — M961 Postlaminectomy syndrome, not elsewhere classified: Secondary | ICD-10-CM

## 2021-02-14 DIAGNOSIS — M5416 Radiculopathy, lumbar region: Secondary | ICD-10-CM

## 2021-02-14 MED ORDER — ALBUTEROL SULFATE HFA 108 (90 BASE) MCG/ACT IN AERS: 2.0000 | INHALATION_SPRAY | RESPIRATORY_TRACT | 1 refills | Status: DC | PRN

## 2021-02-14 NOTE — Progress Notes (Signed)
Alisha Espinoza    [id number]    1965-09-26  Primary Care Physician:Robinson, Blanche East, MD  Referring Physician: Drue Flirt, MD [phone] [address],  [place] [address]  Chief complaint:   Patient being seen for shortness of breath and wheezing History of obstructive lung disease Having trigger finger surgery for clearance  HPI:  Recent exacerbation symptoms is better She continues to smoke Still trying to quit  Was recently evaluated for shortness of breath, fever, wheezing -Symptoms are better  She still does have a chronic cough with clear phlegm No fevers or chills  Some shortness of breath with significant exertion  Evaluation in the ED in March 2022 for shortness of breath and wheezing CT scan did show some groundglass changes, emphysema  Less than pack-a-day smoker  History of hypertension, diabetes, hypercholesterolemia -Optimizing treatment  Uses albuterol as needed  No pertinent occupational history Outpatient Encounter Medications as of 01/02/2021  Medication Sig   albuterol (PROVENTIL HFA;VENTOLIN HFA) 108 (90 Base) MCG/ACT inhaler Inhale 1-2 puffs into the lungs every 4 (four) hours as needed for wheezing or shortness of breath (or coughing).   Blood Glucose Monitoring Suppl (CONTOUR NEXT MONITOR) w/Device KIT 1 each by Does not apply route daily. Use to test blood sugars 1-2 times daily.   gabapentin (NEURONTIN) 400 MG capsule TAKE 1 CAPSULE(400 MG) BY MOUTH THREE TIMES DAILY   glucose blood (CONTOUR NEXT TEST) test strip Use to test 1-2 times daily.   HYDROcodone-acetaminophen (NORCO/VICODIN) 5-325 MG tablet Take 1 tablet by mouth 2 (two) times daily as needed for moderate pain.   ibuprofen (ADVIL) 800 MG tablet TAKE 1 TABLET(800 MG) BY MOUTH EVERY 8 HOURS AS NEEDED FOR MODERATE PAIN   LINZESS 290 MCG CAPS capsule TAKE 1 CAPSULE(290 MCG) BY MOUTH DAILY BEFORE BREAKFAST   metFORMIN (GLUCOPHAGE) 500 MG tablet Take 500 mg by mouth 2  (two) times daily.   Microlet Lancets MISC USE TO TEST BLOOD SUGARS ONCE TO BID   simvastatin (ZOCOR) 20 MG tablet Take 20 mg by mouth daily.   tretinoin (RETIN-A) 0.1 % cream Apply 1 application topically at bedtime.    valsartan-hydrochlorothiazide (DIOVAN-HCT) 160-12.5 MG tablet Take 1 tablet by mouth daily.   No facility-administered encounter medications on file as of 01/02/2021.    Allergies as of 01/02/2021 - Review Complete 01/02/2021  Allergen Reaction Noted   Meloxicam Hives 05/16/2011   Mobic [meloxicam]  05/16/2011    Past Medical History:  Diagnosis Date   Bronchitis    Diabetes mellitus ([place]) 01/19/2019   Dyspnea    Heart murmur    resolved "closed by age 72."    Hyperlipidemia    Hypertension    Seasonal allergies     Past Surgical History:  Procedure Laterality Date   ABDOMINAL HYSTERECTOMY  2000   ANTERIOR CERVICAL DECOMP/DISCECTOMY FUSION  03/19/2012   Procedure: ANTERIOR CERVICAL DECOMPRESSION/DISCECTOMY FUSION 1 LEVEL;  Surgeon: Melina Schools, MD;  Location: [place];  Service: Orthopedics;  Laterality: Left;  Total Disc Replacement C4-5   ANTERIOR CERVICAL DECOMP/DISCECTOMY FUSION N/A 05/07/2012   Procedure: ANTERIOR CERVICAL DECOMPRESSION/DISCECTOMY FUSION 1 LEVEL/HARDWARE REMOVAL;  Surgeon: Melina Schools, MD;  Location: [place];  Service: Orthopedics;  Laterality: N/A;  REMOVAL OF CERVICAL DISC REPLACEMENT AND ACDF C4-5   BACK SURGERY     CARPAL TUNNEL RELEASE Right 09/28/2018   Procedure: CARPAL TUNNEL RELEASE;  Surgeon: Eustace Moore, MD;  Location: [place];  Service:  Neurosurgery;  Laterality: Right;  CARPAL TUNNEL RELEASE   CERVICAL FUSION  05/07/2012   Dr Rolena Infante   COLONOSCOPY     CYSTECTOMY     L wrist   POLYPECTOMY     ROTATOR CUFF REPAIR     Right   TRIGGER FINGER RELEASE Left 05/03/2019   Procedure: LEFT LONG AND RING FINGER RELEASE TRIGGER FINGER/A-1 PULLEY;  Surgeon: Leanora Cover, MD;  Location: [place];  Service: Orthopedics;   Laterality: Left;  beir block   TRIGGER FINGER RELEASE Right 09/23/2019   Procedure: RELEASE TRIGGER FINGER/A-1 PULLEY RIGHT THUMB, LONG AND RING FINGERS;  Surgeon: Leanora Cover, MD;  Location: [place];  Service: Orthopedics;  Laterality: Right;   TYMPANOSTOMY TUBE PLACEMENT      Family History  Problem Relation Age of Onset   Diabetes Mother    Hypertension Father    Pancreatic cancer Sister    Colon cancer Neg Hx    Rectal cancer Neg Hx    Stomach cancer Neg Hx    Colon polyps Neg Hx    Esophageal cancer Neg Hx     Social History   Socioeconomic History   Marital status: Divorced    Spouse name: Not on file   Number of children: Not on file   Years of education: Not on file   Highest education level: Not on file  Occupational History   Not on file  Tobacco Use   Smoking status: Every Day    Packs/day: 0.50    Years: 30.00    Pack years: 15.00    Types: Cigarettes   Smokeless tobacco: Never   Tobacco comments:    10cigs/day  Vaping Use   Vaping Use: Never used  Substance and Sexual Activity   Alcohol use: Not Currently    Comment: occasionally   Drug use: No   Sexual activity: Yes    Birth control/protection: Surgical    Comment: Hysterectomy  Other Topics Concern   Not on file  Social History Narrative   Not on file   Social Determinants of Health   Financial Resource Strain: Not on file  Food Insecurity: Not on file  Transportation Needs: Not on file  Physical Activity: Not on file  Stress: Not on file  Social Connections: Not on file  Intimate Partner Violence: Not on file    Review of Systems  Respiratory:  Positive for cough and shortness of breath.   Psychiatric/Behavioral:  Negative for sleep disturbance.    Vitals:   01/02/21 1327  BP: 130/90  Pulse: 89  Temp: 98.1 F (36.7 C)  SpO2: 94%     Physical Exam Constitutional:      Appearance: She is obese.  HENT:     Head: Normocephalic.     Right Ear: Tympanic  membrane normal.     Nose: No congestion.     Mouth/Throat:     Mouth: Mucous membranes are moist.  Eyes:     Pupils: Pupils are equal, round, and reactive to light.  Cardiovascular:     Rate and Rhythm: Normal rate and regular rhythm.     Heart sounds: No murmur heard.   No friction rub.  Pulmonary:     Effort: No respiratory distress.     Breath sounds: No stridor. No wheezing or rhonchi.  Musculoskeletal:     Cervical back: No rigidity or tenderness.  Neurological:     Mental Status: She is alert.  Psychiatric:  Mood and Affect: Mood normal.   Data Reviewed: She had a CT scan performed in March showing groundglass changes, emphysema-report noted in care everywhere   Assessment:  Chronic obstructive pulmonary disease -Symptoms are relatively stable  Wheezing  Active smoker -Counseled about the need to quit smoking  Shortness of breath on exertion  Encouraged to get back to using Trelegy on a regular basis Albuterol as needed  Surgical outcomes definitely better with her not actively smoking  Plan/Recommendations:  Continue Trelegy  Albuterol as needed  Obtain chest x-ray today  Smoking cessation counseling  Cleared for surgery, not having an acute exacerbation at present No other reversible condition noted apart from the fact that she does need to quit smoking Encouraged to call us with any significant concerns  Tentative follow-up in 6 months   Sherrilyn Rist MD [facility] 01/02/2021, 1:38 PM  CC: Drue Flirt, MD

## 2021-02-14 NOTE — Procedures (Signed)
Advanced programming analysis performed with the [employer] technical representative with physician input and  monitoring. [employer] FAST and contour programming with  combined therapy initiated. 

## 2021-02-14 NOTE — Progress Notes (Signed)
Alisha Espinoza - 55 y.o. female MRN [id number]  Date of birth: 26-Aug-1965  Office Visit Note: Visit Date: 02/07/2021 PCP: Drue Flirt, MD Referred by: Drue Flirt, MD  Subjective: Chief Complaint  Patient presents with   Lower Back - Pain   HPI:  Alisha Espinoza is a 55 y.o. female who comes in today for planned spinal cord stimulator trial.  The patient has failed conservative care including PT/home exercise, medications, time and activity modification.  The patient has chronic, severe and recalcitrant low back pain with neurogenic pain and has failed all other interventional spine procedures.  This represents a procedure of last resort.  They had completed 3 trial implantation neuropsychological evaluation.  Please see our prior notes for further details and justification.   Referring: Dr. Rodell Perna, Dr. Legrand Como Hilts  ROS Otherwise per HPI.  Assessment & Plan: Visit Diagnoses:    ICD-10-CM   1. Post laminectomy syndrome  M96.1 XR C-ARM NO REPORT    Spinal Cord Stimulator - Placement    Spinal Cord Stimulator - Analysis    2. Lumbar radiculopathy  M54.16 XR C-ARM NO REPORT    Spinal Cord Stimulator - Placement    Spinal Cord Stimulator - Analysis    3. Chronic pain syndrome  G89.4 XR C-ARM NO REPORT    Spinal Cord Stimulator - Placement    Spinal Cord Stimulator - Analysis      Plan: No additional findings.   Meds & Orders: No orders of the defined types were placed in this encounter.   Orders Placed This Encounter  Procedures   Spinal Cord Stimulator - Placement   Spinal Cord Stimulator - Analysis   XR C-ARM NO REPORT    Follow-up: Return in about 1 week (around 02/14/2021) for For SCS lead pull.   Procedures: No procedures performed  Spinal Cord Stimulator Trial  Patient: Alisha Espinoza      Date of Birth: 1965/04/08 MRN: [id number] PCP: Drue Flirt, MD      Visit Date: 02/07/2021   Universal Protocol:    Date/Time: 11/16/225:37  AM  Consent Given By: the patient  Position: PRONE  Additional Comments: Vital signs were monitored before and after the procedure. Patient was prepped and draped in the usual sterile fashion. The correct patient, procedure, and site was verified.   Injection Procedure Details:  Procedure Site One Meds Administered: No orders of the defined types were placed in this encounter.    Location/Site: Leads were placed just to the  right of midline with the upper most lead at the top of the T7 vertebral body just at the superior endplate and then spanning 3 vertebral bodies with the inferior lead at the bottom of the T9 vertebral body.   Needle size: 14 gauge   Needle type: EPIMED RX Coud Epidural Needle  Needle Placement: T12-L1 Dorsal epidural space  Findings:  -  -Comments: Excellent paresthesia coverage was obtained in all of the areas of the patient's normal pain  Procedure Details: After localization of the T12-L1 epidural space and the overlying soft tissue, the needle was introduced two bodies below this level to produce an adequate angle for epidural penetration. The soft tissue was infiltrated with 2-3 mls. of 1% Lidocaine without Epinephrine. Using the posterolateral approach, the #14 gauge Epimed needle was inserted down to the posterior aspect of the L1 lamina and then "walked off" the superior aspect of this lamina into the T12-L1 epidural space. The epidural space  was localized with loss of resistance and negative aspirate for CSF or blood. The [employer] (16) electrode stimulation lead was passed up so that just the superior most lead was at the location noted above while maintaining the lead in the midline.   The above procedure was repeated for a second [place] (16) electrode stimulation lead for the opposite side.  90% of the second side procedure was completed but we were unable to really get the lead into the epidural space  Sharol Given the patient's body habitus and anatomy.  The pulse generator system was adjusted and programmed by myself and the company technical representative by varying the stimulator sites, rates, pulse amplitude, and duration. Adequate coverage was obtained as described above.  Subsequent to this, the introducer needle was removed and the spinal cord stimulator trial lead was fastened to the skin using steri-strips and strain reduction loop. The lead wire was then connected and Tegaderm was applied to produce an occlusive dressing. The patient was then brought out of the procedure room and was given a portable stimulator.  The patient will follow-up in three to seven days to determine whether this was efficacious.  Oral prophylactic antibiotic, either Keflex,was prescribed as noted.   Additional Comments:  No complications occurred Dressing: As noted above    Post-procedure details: Patient was observed during the procedure. Post-procedure instructions were reviewed.  Patient left the clinic in stable condition.     Advanced programming analysis performed with the [facility] with physician input and  monitoring.  [employer] FAST and Consulting civil engineer with  combined therapy initiated.    Clinical History: MRI LUMBAR SPINE WITHOUT CONTRAST   TECHNIQUE: Multiplanar, multisequence MR imaging of the lumbar spine was performed. No intravenous contrast was administered.   COMPARISON:  05/20/2018   FINDINGS: Segmentation:  5 lumbar type vertebral bodies.   Alignment:  No malalignment.   Vertebrae:  Previous PLIF L3 through L5.   Conus medullaris and cauda equina: Conus extends to the T12-L1 level. Conus and cauda equina appear normal.   Paraspinal and other soft tissues: Normal   Disc levels:   No abnormality at T11-12, T12-L1 or L1-2.   L2-3: Moderate bulging of the disc. Bilateral facet and ligamentous hypertrophy. There is been  previous posterior decompression at this level and there is sufficient patency of the central canal. There is stenosis of both lateral recesses and neural foramina, but without definite neural compression.   L3 through L5: Previous PLIF. Wide patency of the canal and foramina. No unexpected or complicating features.   L5-S1: Bulging of the disc. Mild facet and ligamentous hypertrophy. Mild narrowing of the subarticular lateral recesses and neural foramina, but without visible neural compression. Similar appearance to the study of February 2020.   IMPRESSION: 1. Good appearance in the fusion segment from L3 through L5. 2. L2-3: Previous posterior decompression, without hardware fusion or discectomy. Moderate bulging of the disc. Bilateral facet and ligamentous hypertrophy. Narrowing of the lateral recesses and neural foramina, but without definite neural compression. 3. L5-S1: Bulging of the disc. Mild facet and ligamentous hypertrophy. Mild narrowing of the subarticular lateral recesses and neural foramina, but without visible neural compression. Similar appearance to the study of February 2020.     Electronically Signed   By: Nelson Chimes M.D.   On: 06/15/2020 14:55     Objective:  VS:  HT:    WT:   BMI:     BP:(!) 145/102  HR:80bpm  TEMP: ( )  RESP:  Physical Exam Vitals and nursing note reviewed.  Constitutional:      General: She is not in acute distress.    Appearance: Normal appearance. She is not ill-appearing.  HENT:     Head: Normocephalic and atraumatic.     Right Ear: External ear normal.     Left Ear: External ear normal.  Eyes:     Extraocular Movements: Extraocular movements intact.  Cardiovascular:     Rate and Rhythm: Normal rate.     Pulses: Normal pulses.  Pulmonary:     Effort: Pulmonary effort is normal. No respiratory distress.  Abdominal:     General: There is no distension.     Palpations: Abdomen is soft.  Musculoskeletal:         General: Tenderness present.     Cervical back: Neck supple.     Right lower leg: No edema.     Left lower leg: No edema.     Comments: Patient has good distal strength with no pain over the greater trochanters.  No clonus or focal weakness.  Skin:    Findings: No erythema, lesion or rash.  Neurological:     General: No focal deficit present.     Mental Status: She is alert and oriented to person, place, and time.     Sensory: No sensory deficit.     Motor: No weakness or abnormal muscle tone.     Coordination: Coordination normal.  Psychiatric:        Mood and Affect: Mood normal.        Behavior: Behavior normal.     Imaging: No results found.

## 2021-02-14 NOTE — Progress Notes (Signed)
Patient states that her pain has been 55%-60% better with trial.

## 2021-02-14 NOTE — Patient Instructions (Addendum)
Obtain chest x-ray today  Continue Trelegy  Albuterol as needed  You are cleared for surgery  Continue to work on quitting smoking

## 2021-02-14 NOTE — Procedures (Signed)
Spinal Cord Stimulator Trial  Patient: Alisha Espinoza      Date of Birth: [birth year] MRN: [id number] PCP: Drue Flirt, MD      Visit Date: 02/07/2021   Universal Protocol:    Date/Time: 11/16/225:37 AM  Consent Given By: the patient  Position: PRONE  Additional Comments: Vital signs were monitored before and after the procedure. Patient was prepped and draped in the usual sterile fashion. The correct patient, procedure, and site was verified.   Injection Procedure Details:  Procedure Site One Meds Administered: No orders of the defined types were placed in this encounter.    Location/Site: Leads were placed just to the  right of midline with the upper most lead at the top of the T7 vertebral body just at the superior endplate and then spanning 3 vertebral bodies with the inferior lead at the bottom of the T9 vertebral body.   Needle size: 14 gauge   Needle type: EPIMED RX Coud Epidural Needle  Needle Placement: T12-L1 Dorsal epidural space  Findings:  -  -Comments: Excellent paresthesia coverage was obtained in all of the areas of the patient's normal pain  Procedure Details: After localization of the T12-L1 epidural space and the overlying soft tissue, the needle was introduced two bodies below this level to produce an adequate angle for epidural penetration. The soft tissue was infiltrated with 2-3 mls. of 1% Lidocaine without Epinephrine. Using the posterolateral approach, the #14 gauge Epimed needle was inserted down to the posterior aspect of the L1 lamina and then "walked off" the superior aspect of this lamina into the T12-L1 epidural space. The epidural space was localized with loss of resistance and negative aspirate for CSF or blood. The [employer] (16) electrode stimulation lead was passed up so that just the superior most lead was at the location noted above while maintaining the lead in the midline.   The above procedure was repeated for a  second Craig (16) electrode stimulation lead for the opposite side.  90% of the second side procedure was completed but we were unable to really get the lead into the epidural space Sharol Given the patient's body habitus and anatomy.  The pulse generator system was adjusted and programmed by myself and the company technical representative by varying the stimulator sites, rates, pulse amplitude, and duration. Adequate coverage was obtained as described above.  Subsequent to this, the introducer needle was removed and the spinal cord stimulator trial lead was fastened to the skin using steri-strips and strain reduction loop. The lead wire was then connected and Tegaderm was applied to produce an occlusive dressing. The patient was then brought out of the procedure room and was given a portable stimulator.  The patient will follow-up in three to seven days to determine whether this was efficacious.  Oral prophylactic antibiotic, either Keflex,was prescribed as noted.   Additional Comments:  No complications occurred Dressing: As noted above    Post-procedure details: Patient was observed during the procedure. Post-procedure instructions were reviewed.  Patient left the clinic in stable condition.

## 2021-02-15 ENCOUNTER — Encounter: Payer: Self-pay | Admitting: Physical Medicine and Rehabilitation

## 2021-02-15 NOTE — Progress Notes (Signed)
Alisha Espinoza - 55 y.o. female MRN [id number]  Date of birth: 29-Nov-1965  Office Visit Note: Visit Date: 02/14/2021 PCP: Drue Flirt, MD Referred by: Drue Flirt, MD  Subjective: Chief Complaint  Patient presents with   Lower Back - Pain   HPI: Alisha Espinoza is a 55 y.o. female who comes in today for spinal cord stimulator lead pull and management of chronic worsening severe pain which has been recalcitrant to previous treatment.  She reports greater than 60% pain relief with the stimulator trial and is very pleased with the amount of relief and functional increase he has obtained.  She is able to walk and do more with the stimulator in place.  She does want to move forward with implant.  Quick procedure note: Patient was placed prone on the exam table. The external stimulator/generator was unfastened from the leads. The outer Tegaderm was removed from the bandage carefully. There was no noted erythema of the skin or swelling or induration or drainage. There had been some bloody discharge in the gauze pad. Both trial leads were pulled without difficulty and without discomfort. There was no drainage from the insertion site. Band-Aid was applied.      ROS Otherwise per HPI.  Assessment & Plan: Visit Diagnoses:    ICD-10-CM   1. Post laminectomy syndrome  M96.1 Ambulatory referral to Anesthesiology    2. Lumbar radiculopathy  M54.16 Ambulatory referral to Anesthesiology    3. Chronic pain syndrome  G89.4 Ambulatory referral to Anesthesiology       Plan: No additional findings.   Meds & Orders: No orders of the defined types were placed in this encounter.   Orders Placed This Encounter  Procedures   Ambulatory referral to Anesthesiology    Follow-up: Return if symptoms worsen or fail to improve.   Procedures: No procedures performed      Clinical History: MRI LUMBAR SPINE WITHOUT CONTRAST   TECHNIQUE: Multiplanar, multisequence MR imaging of the lumbar  spine was performed. No intravenous contrast was administered.   COMPARISON:  05/20/2018   FINDINGS: Segmentation:  5 lumbar type vertebral bodies.   Alignment:  No malalignment.   Vertebrae:  Previous PLIF L3 through L5.   Conus medullaris and cauda equina: Conus extends to the T12-L1 level. Conus and cauda equina appear normal.   Paraspinal and other soft tissues: Normal   Disc levels:   No abnormality at T11-12, T12-L1 or L1-2.   L2-3: Moderate bulging of the disc. Bilateral facet and ligamentous hypertrophy. There is been previous posterior decompression at this level and there is sufficient patency of the central canal. There is stenosis of both lateral recesses and neural foramina, but without definite neural compression.   L3 through L5: Previous PLIF. Wide patency of the canal and foramina. No unexpected or complicating features.   L5-S1: Bulging of the disc. Mild facet and ligamentous hypertrophy. Mild narrowing of the subarticular lateral recesses and neural foramina, but without visible neural compression. Similar appearance to the study of February 2020.   IMPRESSION: 1. Good appearance in the fusion segment from L3 through L5. 2. L2-3: Previous posterior decompression, without hardware fusion or discectomy. Moderate bulging of the disc. Bilateral facet and ligamentous hypertrophy. Narrowing of the lateral recesses and neural foramina, but without definite neural compression. 3. L5-S1: Bulging of the disc. Mild facet and ligamentous hypertrophy. Mild narrowing of the subarticular lateral recesses and neural foramina, but without visible neural compression. Similar appearance to the study of  February 2020.     Electronically Signed   By: Nelson Chimes M.D.   On: 06/15/2020 14:55   She reports that she has been smoking cigarettes. She has a 15.00 pack-year smoking history. She has never used smokeless tobacco. No results for input(s): HGBA1C, LABURIC in the  last 8760 hours.  Objective:  VS:  HT:    WT:   BMI:     BP:   HR: bpm  TEMP: ( )  RESP:  Physical Exam Skin:    Findings: No bruising, erythema, lesion or rash.    Ortho Exam  Imaging: DG Chest 2 View  Result Date: 02/15/2021 CLINICAL DATA:  Pre-op clearance exam EXAM: CHEST - 2 VIEW COMPARISON:  01/06/2021 FINDINGS: The heart size and mediastinal contours are within normal limits. Both lungs are clear. The visualized skeletal structures are unremarkable. IMPRESSION: No active cardiopulmonary disease. Electronically Signed   By: Marlaine Hind M.D.   On: 02/15/2021 08:14    Past Medical/Family/Surgical/Social History: Medications & Allergies reviewed per EMR, new medications updated. 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   Hemorrhoids 01/21/2019   Essential hypertension 01/21/2019   Serum calcium elevated 01/21/2019   Elevated cholesterol 01/19/2019   Diabetes mellitus ([place]) 01/19/2019   Trigger ring finger of left hand 01/11/2019   Trigger finger of right thumb 01/11/2019   S/P lumbar fusion 09/28/2018   Acute bronchitis 08/08/2015   Leukocytosis 08/08/2015   Hyperglycemia 08/08/2015   Tobacco abuse 08/08/2015   OTHER CHRONIC INFECTIVE OTITIS EXTERNA 04/27/2008   BACTERIAL PNEUMONIA, RIGHT LOWER LOBE 04/27/2008   LUMP OR MASS IN BREAST 04/27/2008   COUGH 04/27/2008   Past Medical History:  Diagnosis Date   Bronchitis    Diabetes mellitus ([place]) 01/19/2019   Dyspnea    Heart murmur    resolved "closed by age 67."    Hyperlipidemia    Hypertension    Seasonal allergies    Family History  Problem Relation Age of Onset   Diabetes Mother    Hypertension Father    Pancreatic cancer Sister    Colon cancer Neg Hx    Rectal cancer Neg Hx    Stomach cancer Neg Hx    Colon polyps Neg Hx    Esophageal cancer Neg Hx    Past Surgical History:  Procedure Laterality Date   ABDOMINAL HYSTERECTOMY  2000    ANTERIOR CERVICAL DECOMP/DISCECTOMY FUSION  03/19/2012   Procedure: ANTERIOR CERVICAL DECOMPRESSION/DISCECTOMY FUSION 1 LEVEL;  Surgeon: Melina Schools, MD;  Location: [place];  Service: Orthopedics;  Laterality: Left;  Total Disc Replacement C4-5   ANTERIOR CERVICAL DECOMP/DISCECTOMY FUSION N/A 05/07/2012   Procedure: ANTERIOR CERVICAL DECOMPRESSION/DISCECTOMY FUSION 1 LEVEL/HARDWARE REMOVAL;  Surgeon: Melina Schools, MD;  Location: [place];  Service: Orthopedics;  Laterality: N/A;  REMOVAL OF CERVICAL DISC REPLACEMENT AND ACDF C4-5   BACK SURGERY     CARPAL TUNNEL RELEASE Right 09/28/2018   Procedure: CARPAL TUNNEL RELEASE;  Surgeon: Eustace Moore, MD;  Location: [place];  Service: Neurosurgery;  Laterality: Right;  CARPAL TUNNEL RELEASE   CERVICAL FUSION  05/07/2012   Dr Rolena Infante   COLONOSCOPY     CYSTECTOMY     L wrist   POLYPECTOMY     ROTATOR CUFF REPAIR     Right   TRIGGER FINGER RELEASE Left 05/03/2019   Procedure: LEFT LONG AND RING FINGER RELEASE TRIGGER FINGER/A-1 PULLEY;  Surgeon: Leanora Cover,  MD;  Location: [place];  Service: Orthopedics;  Laterality: Left;  beir block   TRIGGER FINGER RELEASE Right 09/23/2019   Procedure: RELEASE TRIGGER FINGER/A-1 PULLEY RIGHT THUMB, LONG AND RING FINGERS;  Surgeon: Leanora Cover, MD;  Location: [place];  Service: Orthopedics;  Laterality: Right;   TYMPANOSTOMY TUBE PLACEMENT     Social History   Occupational History   Not on file  Tobacco Use   Smoking status: Every Day    Packs/day: 0.50    Years: 30.00    Pack years: 15.00    Types: Cigarettes   Smokeless tobacco: Never   Tobacco comments:    10cigs/day  Vaping Use   Vaping Use: Never used  Substance and Sexual Activity   Alcohol use: Not Currently    Comment: occasionally   Drug use: No   Sexual activity: Yes    Birth control/protection: Surgical    Comment: Hysterectomy

## 2021-02-15 NOTE — Telephone Encounter (Signed)
Brenda from [facility] would like surgical clearance form and ov note. Hassan Rowan phone number is [phone]. Fax number is [phone].

## 2021-02-16 ENCOUNTER — Other Ambulatory Visit: Payer: Self-pay

## 2021-02-16 ENCOUNTER — Encounter (HOSPITAL_BASED_OUTPATIENT_CLINIC_OR_DEPARTMENT_OTHER): Payer: Self-pay | Admitting: Orthopedic Surgery

## 2021-02-19 NOTE — Telephone Encounter (Signed)
Called and spoke with Alisha Espinoza  Advised her of our protocol of not doing forms and instead faxing ov note with risk assessment  She verbalized understanding  I confirmed her fax and sent her ov note from 02/14/21 Nothing further needed

## 2021-02-21 NOTE — Progress Notes (Signed)
Called patient and left message on machine to arrive DOS at 1115 (2 hours prior to surgery) to ensure time to do EKG and blood work (which will require specimen transport to main hospital)

## 2021-02-21 NOTE — Progress Notes (Signed)
RN called pt regarding drawing labs. Pt states she cannot come to [facility] for labs today because she is out of town. Pt states she was told to come 1 hour early on Monday in order for labs to be drawn. Preop notified.

## 2021-02-26 DIAGNOSIS — E1169 Type 2 diabetes mellitus with other specified complication: Secondary | ICD-10-CM

## 2021-02-27 ENCOUNTER — Encounter: Payer: Medicare Other | Admitting: Physical Medicine and Rehabilitation

## 2021-02-28 ENCOUNTER — Ambulatory Visit (HOSPITAL_COMMUNITY): Payer: Medicare Other

## 2021-02-28 ENCOUNTER — Other Ambulatory Visit: Payer: Self-pay | Admitting: Physical Medicine and Rehabilitation

## 2021-02-28 DIAGNOSIS — M961 Postlaminectomy syndrome, not elsewhere classified: Secondary | ICD-10-CM

## 2021-02-28 DIAGNOSIS — M5416 Radiculopathy, lumbar region: Secondary | ICD-10-CM

## 2021-02-28 DIAGNOSIS — G894 Chronic pain syndrome: Secondary | ICD-10-CM

## 2021-02-28 NOTE — Progress Notes (Signed)
Referral faxed to [facility]- [address] at [phone], p [phone]

## 2021-02-28 NOTE — Progress Notes (Signed)
Referral to Dr. Burton Apley with [facility] for implantation of SCS after successful trial. Patient had balance with [facility].

## 2021-03-01 ENCOUNTER — Other Ambulatory Visit: Payer: Self-pay | Admitting: Physical Medicine and Rehabilitation

## 2021-03-01 ENCOUNTER — Telehealth: Payer: Self-pay

## 2021-03-01 MED ORDER — HYDROCODONE-ACETAMINOPHEN 5-325 MG PO TABS: 1.0000 | ORAL_TABLET | Freq: Two times a day (BID) | ORAL | 0 refills | Status: DC | PRN

## 2021-03-01 NOTE — Telephone Encounter (Signed)
Patient notified

## 2021-03-01 NOTE — Telephone Encounter (Signed)
Patient called asking for a refill on pain medication. Hydrocodone/APAP #60 last filled on 01/25/21, next appt 03/06/21.

## 2021-03-06 ENCOUNTER — Encounter
Payer: Medicare Other | Attending: Physical Medicine and Rehabilitation | Admitting: Physical Medicine and Rehabilitation

## 2021-03-06 ENCOUNTER — Other Ambulatory Visit: Payer: Self-pay

## 2021-03-06 ENCOUNTER — Encounter: Payer: Self-pay | Admitting: Physical Medicine and Rehabilitation

## 2021-03-06 VITALS — BP 143/84 | HR 87 | Temp 98.3°F | Ht 66.0 in | Wt 195.6 lb

## 2021-03-06 DIAGNOSIS — M961 Postlaminectomy syndrome, not elsewhere classified: Secondary | ICD-10-CM | POA: Diagnosis present

## 2021-03-06 DIAGNOSIS — R0989 Other specified symptoms and signs involving the circulatory and respiratory systems: Secondary | ICD-10-CM | POA: Diagnosis present

## 2021-03-06 NOTE — Patient Instructions (Signed)
Pound that may reduce pain: 1) Ginger (especially studied for arthritis)- reduce leukotriene production to decrease inflammation 2) Blueberries- high in phytonutrients that decrease inflammation 3) Salmon- marine omega-3s reduce joint swelling and pain 4) Pumpkin seeds- reduce inflammation 5) dark chocolate- reduces inflammation 6) turmeric- reduces inflammation 7) tart cherries - reduce pain and stiffness 8) extra virgin olive oil - its compound olecanthal helps to block prostaglandins  9) chili peppers- can be eaten or applied topically via capsaicin 10) mint- helpful for headache, muscle aches, joint pain, and itching 11) garlic- reduces inflammation  Link to further information on diet for chronic pain: [email or web address]

## 2021-03-06 NOTE — Progress Notes (Signed)
Subjective:    Patient ID: Alisha Espinoza, female    DOB: 05-30-1965, 55 y.o.   MRN: [id number]  HPI [name] is a 55 year old woman who presents for follow-up of low back pain  1) Low back pain -continues to hurt.  -does not need any refills today -had good benefit from [facility] in the past -sometimes moves into the leg -she is taking Cymbalta every day  2) muscle pain in legs -improved with either cymbalta and steroids- improved to 2  3) Bilateral hip pain -started yesterday  Pain Inventory Average Pain 8 Pain Right Now 9 My pain is intermittent, sharp, stabbing, tingling, and aching  In the last 24 hours, has pain interfered with the following? General activity 9 Relation with others 10 Enjoyment of life 10 What TIME of day is your pain at its worst? morning , daytime, evening, and night Sleep (in general) Poor  Pain is worse with: walking, bending, sitting, inactivity, standing, and some activites Pain improves with: rest, heat/ice, and medication Relief from Meds: 3  Family History  Problem Relation Age of Onset   Diabetes Mother    Hypertension Father    Pancreatic cancer Sister    Colon cancer Neg Hx    Rectal cancer Neg Hx    Stomach cancer Neg Hx    Colon polyps Neg Hx    Esophageal cancer Neg Hx    Social History   Socioeconomic History   Marital status: Divorced    Spouse name: Not on file   Number of children: Not on file   Years of education: Not on file   Highest education level: Not on file  Occupational History   Not on file  Tobacco Use   Smoking status: Every Day    Packs/day: 0.50    Years: 30.00    Pack years: 15.00    Types: Cigarettes   Smokeless tobacco: Never   Tobacco comments:    10cigs/day  Vaping Use   Vaping Use: Never used  Substance and Sexual Activity   Alcohol use: Not Currently    Comment: occasionally   Drug use: No   Sexual activity: Yes    Birth control/protection: Surgical    Comment: Hysterectomy  Other  Topics Concern   Not on file  Social History Narrative   Not on file   Social Determinants of Health   Financial Resource Strain: Not on file  Food Insecurity: Not on file  Transportation Needs: Not on file  Physical Activity: Not on file  Stress: Not on file  Social Connections: Not on file   Past Surgical History:  Procedure Laterality Date   ABDOMINAL HYSTERECTOMY  2000   ANTERIOR CERVICAL DECOMP/DISCECTOMY FUSION  03/19/2012   Procedure: ANTERIOR CERVICAL DECOMPRESSION/DISCECTOMY FUSION 1 LEVEL;  Surgeon: Melina Schools, MD;  Location: [place];  Service: Orthopedics;  Laterality: Left;  Total Disc Replacement C4-5   ANTERIOR CERVICAL DECOMP/DISCECTOMY FUSION N/A 05/07/2012   Procedure: ANTERIOR CERVICAL DECOMPRESSION/DISCECTOMY FUSION 1 LEVEL/HARDWARE REMOVAL;  Surgeon: Melina Schools, MD;  Location: [place];  Service: Orthopedics;  Laterality: N/A;  REMOVAL OF CERVICAL DISC REPLACEMENT AND ACDF C4-5   BACK SURGERY     CARPAL TUNNEL RELEASE Right 09/28/2018   Procedure: CARPAL TUNNEL RELEASE;  Surgeon: Eustace Moore, MD;  Location: [place];  Service: Neurosurgery;  Laterality: Right;  CARPAL TUNNEL RELEASE   CERVICAL FUSION  05/07/2012   Dr Rolena Infante   COLONOSCOPY     CYSTECTOMY  L wrist   POLYPECTOMY     ROTATOR CUFF REPAIR     Right   TRIGGER FINGER RELEASE Left 05/03/2019   Procedure: LEFT LONG AND RING FINGER RELEASE TRIGGER FINGER/A-1 PULLEY;  Surgeon: Leanora Cover, MD;  Location: [place];  Service: Orthopedics;  Laterality: Left;  beir block   TRIGGER FINGER RELEASE Right 09/23/2019   Procedure: RELEASE TRIGGER FINGER/A-1 PULLEY RIGHT THUMB, LONG AND RING FINGERS;  Surgeon: Leanora Cover, MD;  Location: [place];  Service: Orthopedics;  Laterality: Right;   TYMPANOSTOMY TUBE PLACEMENT     Past Surgical History:  Procedure Laterality Date   ABDOMINAL HYSTERECTOMY  2000   ANTERIOR CERVICAL DECOMP/DISCECTOMY FUSION  03/19/2012   Procedure:  ANTERIOR CERVICAL DECOMPRESSION/DISCECTOMY FUSION 1 LEVEL;  Surgeon: Melina Schools, MD;  Location: [place];  Service: Orthopedics;  Laterality: Left;  Total Disc Replacement C4-5   ANTERIOR CERVICAL DECOMP/DISCECTOMY FUSION N/A 05/07/2012   Procedure: ANTERIOR CERVICAL DECOMPRESSION/DISCECTOMY FUSION 1 LEVEL/HARDWARE REMOVAL;  Surgeon: Melina Schools, MD;  Location: [place];  Service: Orthopedics;  Laterality: N/A;  REMOVAL OF CERVICAL DISC REPLACEMENT AND ACDF C4-5   BACK SURGERY     CARPAL TUNNEL RELEASE Right 09/28/2018   Procedure: CARPAL TUNNEL RELEASE;  Surgeon: Eustace Moore, MD;  Location: [place];  Service: Neurosurgery;  Laterality: Right;  CARPAL TUNNEL RELEASE   CERVICAL FUSION  05/07/2012   Dr Rolena Infante   COLONOSCOPY     CYSTECTOMY     L wrist   POLYPECTOMY     ROTATOR CUFF REPAIR     Right   TRIGGER FINGER RELEASE Left 05/03/2019   Procedure: LEFT LONG AND RING FINGER RELEASE TRIGGER FINGER/A-1 PULLEY;  Surgeon: Leanora Cover, MD;  Location: [place];  Service: Orthopedics;  Laterality: Left;  beir block   TRIGGER FINGER RELEASE Right 09/23/2019   Procedure: RELEASE TRIGGER FINGER/A-1 PULLEY RIGHT THUMB, LONG AND RING FINGERS;  Surgeon: Leanora Cover, MD;  Location: [place];  Service: Orthopedics;  Laterality: Right;   TYMPANOSTOMY TUBE PLACEMENT     Past Medical History:  Diagnosis Date   Bronchitis    COPD (chronic obstructive pulmonary disease) ([place])    Diabetes mellitus ([place]) 01/19/2019   Dyspnea    Heart murmur    resolved "closed by age 37."    Hyperlipidemia    Hypertension    Seasonal allergies    Ht 5\' 6"  (1.676 m)   Wt 195 lb 9.6 oz (88.7 kg)   BMI 31.57 kg/m   Opioid Risk Score:   Fall Risk Score:  `1  Depression screen PHQ 2/9  Depression screen [facility] 2/9 03/06/2021 01/25/2021 12/19/2020 10/24/2020 09/15/2020 07/19/2020 07/18/2020  Decreased Interest 0 0 1 0 0 1 1  Down, Depressed, Hopeless 0 0 1 0 0 1 1  PHQ - 2 Score 0 0 2 0 0 2 2   Altered sleeping - - - 0 - - -  Tired, decreased energy - - - - - - -  Change in appetite - - - - - - -  Feeling bad or failure about yourself  - - - - - - -  Trouble concentrating - - - - - - -  Moving slowly or fidgety/restless - - - - - - -  Suicidal thoughts - - - - - - -  PHQ-9 Score - - - 0 - - -  Difficult doing work/chores - - - - - - -  Some recent  data might be hidden     Review of Systems  Constitutional: Negative.   HENT: Negative.    Eyes: Negative.   Respiratory: Negative.    Cardiovascular: Negative.   Gastrointestinal: Negative.   Endocrine: Negative.   Genitourinary: Negative.   Musculoskeletal: Negative.   Skin: Negative.   Allergic/Immunologic: Negative.   Neurological: Negative.   Hematological: Negative.   Psychiatric/Behavioral: Negative.        Objective:   Physical Exam  Gen: no distress, normal appearing HEENT: oral mucosa pink and moist, NCAT Cardio: Reg rate Chest: normal effort, normal rate of breathing Abd: soft, non-distended Ext: no edema Psych: pleasant, normal affect Skin: intact Neuro: Alert and oriented x3.  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  -Discussed benefits of exercise in reducing pain. -continue current regimen -Discussed following foods that may reduce pain: 1) Ginger (especially studied for arthritis)- reduce leukotriene production to decrease inflammation 2) Blueberries- high in phytonutrients that decrease inflammation 3) Salmon- marine omega-3s reduce joint swelling and pain 4) Pumpkin seeds- reduce inflammation 5) dark chocolate- reduces inflammation 6) turmeric- reduces inflammation 7) tart cherries - reduce pain and stiffness 8) extra virgin olive oil - its compound olecanthal helps to block prostaglandins  9) chili peppers- can  be eaten or applied topically via capsaicin 10) mint- helpful for headache, muscle aches, joint pain, and itching 11) garlic- reduces inflammation  Link to further information on diet for chronic pain: http://www.randall.com/   2) Chest congestion -continue nebulizer -continue steroids -continue amoxicillin.

## 2021-03-07 ENCOUNTER — Ambulatory Visit (HOSPITAL_COMMUNITY)
Admission: RE | Admit: 2021-03-07 | Discharge: 2021-03-07 | Disposition: A | Discharge status: Home / Self Care | Payer: Medicare Other | Source: Ambulatory Visit | Attending: Pulmonary Disease | Admitting: Pulmonary Disease

## 2021-03-07 DIAGNOSIS — R9389 Abnormal findings on diagnostic imaging of other specified body structures: Secondary | ICD-10-CM | POA: Diagnosis present

## 2021-03-09 ENCOUNTER — Ambulatory Visit (INDEPENDENT_AMBULATORY_CARE_PROVIDER_SITE_OTHER): Payer: Medicare Other | Admitting: Pulmonary Disease

## 2021-03-09 ENCOUNTER — Encounter: Payer: Self-pay | Admitting: Pulmonary Disease

## 2021-03-09 ENCOUNTER — Other Ambulatory Visit: Payer: Self-pay

## 2021-03-09 VITALS — BP 124/72 | HR 83 | Temp 97.9°F | Ht 66.0 in | Wt 194.0 lb

## 2021-03-09 DIAGNOSIS — R9389 Abnormal findings on diagnostic imaging of other specified body structures: Secondary | ICD-10-CM

## 2021-03-09 DIAGNOSIS — R0602 Shortness of breath: Secondary | ICD-10-CM

## 2021-03-09 LAB — PULMONARY FUNCTION TEST
DL/VA % pred: 81 %
DL/VA: 3.41 ml/min/mmHg/L
DLCO cor % pred: 60 %
DLCO cor: 13.32 ml/min/mmHg
DLCO unc % pred: 60 %
DLCO unc: 13.32 ml/min/mmHg
FEF 25-75 Post: 1.8 L/sec
FEF 25-75 Pre: 1.66 L/sec
FEF2575-%Change-Post: 8 %
FEF2575-%Pred-Post: 74 %
FEF2575-%Pred-Pre: 68 %
FEV1-%Change-Post: 2 %
FEV1-%Pred-Post: 75 %
FEV1-%Pred-Pre: 74 %
FEV1-Post: 1.81 L
FEV1-Pre: 1.78 L
FEV1FVC-%Change-Post: 1 %
FEV1FVC-%Pred-Pre: 98 %
FEV6-%Change-Post: 0 %
FEV6-%Pred-Post: 77 %
FEV6-%Pred-Pre: 76 %
FEV6-Post: 2.26 L
FEV6-Pre: 2.25 L
FEV6FVC-%Pred-Post: 103 %
FEV6FVC-%Pred-Pre: 103 %
FVC-%Change-Post: 0 %
FVC-%Pred-Post: 74 %
FVC-%Pred-Pre: 74 %
FVC-Post: 2.26 L
FVC-Pre: 2.25 L
Post FEV1/FVC ratio: 80 %
Post FEV6/FVC ratio: 100 %
Pre FEV1/FVC ratio: 79 %
Pre FEV6/FVC Ratio: 100 %
RV % pred: 94 %
RV: 1.88 L
TLC % pred: 82 %
TLC: 4.4 L

## 2021-03-09 NOTE — Progress Notes (Signed)
Full PFT performed today. °

## 2021-03-09 NOTE — Patient Instructions (Signed)
I will see you 6 months from here  Continue Trelegy  Albuterol as needed  Continue working on trying to quit smoking  Call with significant concerns

## 2021-03-09 NOTE — Patient Instructions (Signed)
Full PFT performed today. °

## 2021-03-09 NOTE — Progress Notes (Signed)
Alisha Espinoza    [id number]    10/15/1965  Primary Care Physician:Robinson, Blanche East, MD  Referring Physician: Drue Flirt, MD [phone] [place],  [place] [address]  Chief complaint:    History of obstructive lung disease Had PFT today  HPI:  Recent exacerbation symptoms is better She continues to smoke Still trying to quit  PFT shows mild obstructive disease, mild reduction in diffusing capacity  Was recently evaluated for shortness of breath, fever, wheezing -Symptoms are better  She still does have a chronic cough with clear phlegm No fevers or chills  Some shortness of breath with significant exertion  Evaluation in the ED in March 2022 for shortness of breath and wheezing CT scan did show some groundglass changes, emphysema  Less than pack-a-day smoker  History of hypertension, diabetes, hypercholesterolemia -Optimizing treatment  Only uses albuterol as needed  Outpatient Encounter Medications as of 03/09/2021  Medication Sig   albuterol (VENTOLIN HFA) 108 (90 Base) MCG/ACT inhaler Inhale 2 puffs into the lungs every 4 (four) hours as needed for wheezing or shortness of breath (or coughing).   Blood Glucose Monitoring Suppl (CONTOUR NEXT MONITOR) w/Device KIT 1 each by Does not apply route daily. Use to test blood sugars 1-2 times daily.   Fluticasone-Umeclidin-Vilant (TRELEGY ELLIPTA) 100-62.5-25 MCG/INH AEPB Inhale 1 puff into the lungs daily.   gabapentin (NEURONTIN) 400 MG capsule TAKE 1 CAPSULE(400 MG) BY MOUTH THREE TIMES DAILY   glucose blood (CONTOUR NEXT TEST) test strip Use to test 1-2 times daily.   HYDROcodone-acetaminophen (NORCO/VICODIN) 5-325 MG tablet Take 1 tablet by mouth 2 (two) times daily as needed for moderate pain.   ibuprofen (ADVIL) 800 MG tablet TAKE 1 TABLET(800 MG) BY MOUTH EVERY 8 HOURS AS NEEDED FOR MODERATE PAIN   LINZESS 290 MCG CAPS capsule TAKE 1 CAPSULE(290 MCG) BY MOUTH DAILY BEFORE BREAKFAST    metFORMIN (GLUCOPHAGE) 500 MG tablet Take 500 mg by mouth 2 (two) times daily.   Microlet Lancets MISC USE TO TEST BLOOD SUGARS ONCE TO BID   simvastatin (ZOCOR) 20 MG tablet Take 20 mg by mouth daily.   tretinoin (RETIN-A) 0.1 % cream Apply 1 application topically at bedtime.    valsartan-hydrochlorothiazide (DIOVAN-HCT) 160-12.5 MG tablet Take 1 tablet by mouth daily.   No facility-administered encounter medications on file as of 03/09/2021.    Allergies as of 03/09/2021 - Review Complete 03/09/2021  Allergen Reaction Noted   Mobic [meloxicam] Hives 05/16/2011   Meloxicam Hives 05/16/2011    Past Medical History:  Diagnosis Date   Bronchitis    COPD (chronic obstructive pulmonary disease) ([place])    Diabetes mellitus ([place]) 01/19/2019   Dyspnea    Heart murmur    resolved "closed by age 87."    Hyperlipidemia    Hypertension    Seasonal allergies     Past Surgical History:  Procedure Laterality Date   ABDOMINAL HYSTERECTOMY  2000   ANTERIOR CERVICAL DECOMP/DISCECTOMY FUSION  03/19/2012   Procedure: ANTERIOR CERVICAL DECOMPRESSION/DISCECTOMY FUSION 1 LEVEL;  Surgeon: Melina Schools, MD;  Location: [place];  Service: Orthopedics;  Laterality: Left;  Total Disc Replacement C4-5   ANTERIOR CERVICAL DECOMP/DISCECTOMY FUSION N/A 05/07/2012   Procedure: ANTERIOR CERVICAL DECOMPRESSION/DISCECTOMY FUSION 1 LEVEL/HARDWARE REMOVAL;  Surgeon: Melina Schools, MD;  Location: [place];  Service: Orthopedics;  Laterality: N/A;  REMOVAL OF CERVICAL DISC REPLACEMENT AND ACDF C4-5   BACK SURGERY     CARPAL TUNNEL RELEASE  Right 09/28/2018   Procedure: CARPAL TUNNEL RELEASE;  Surgeon: Eustace Moore, MD;  Location: [place];  Service: Neurosurgery;  Laterality: Right;  CARPAL TUNNEL RELEASE   CERVICAL FUSION  05/07/2012   Dr Rolena Infante   COLONOSCOPY     CYSTECTOMY     L wrist   POLYPECTOMY     ROTATOR CUFF REPAIR     Right   TRIGGER FINGER RELEASE Left 05/03/2019   Procedure: LEFT LONG AND RING FINGER RELEASE  TRIGGER FINGER/A-1 PULLEY;  Surgeon: Leanora Cover, MD;  Location: [place];  Service: Orthopedics;  Laterality: Left;  beir block   TRIGGER FINGER RELEASE Right 09/23/2019   Procedure: RELEASE TRIGGER FINGER/A-1 PULLEY RIGHT THUMB, LONG AND RING FINGERS;  Surgeon: Leanora Cover, MD;  Location: [place];  Service: Orthopedics;  Laterality: Right;   TYMPANOSTOMY TUBE PLACEMENT      Family History  Problem Relation Age of Onset   Diabetes Mother    Hypertension Father    Pancreatic cancer Sister    Colon cancer Neg Hx    Rectal cancer Neg Hx    Stomach cancer Neg Hx    Colon polyps Neg Hx    Esophageal cancer Neg Hx     Social History   Socioeconomic History   Marital status: Divorced    Spouse name: Not on file   Number of children: Not on file   Years of education: Not on file   Highest education level: Not on file  Occupational History   Not on file  Tobacco Use   Smoking status: Every Day    Packs/day: 0.50    Years: 30.00    Pack years: 15.00    Types: Cigarettes   Smokeless tobacco: Never   Tobacco comments:    10cigs/day  Vaping Use   Vaping Use: Never used  Substance and Sexual Activity   Alcohol use: Not Currently    Comment: occasionally   Drug use: No   Sexual activity: Yes    Birth control/protection: Surgical    Comment: Hysterectomy  Other Topics Concern   Not on file  Social History Narrative   Not on file   Social Determinants of Health   Financial Resource Strain: Not on file  Food Insecurity: Not on file  Transportation Needs: Not on file  Physical Activity: Not on file  Stress: Not on file  Social Connections: Not on file  Intimate Partner Violence: Not on file    Review of Systems  Respiratory:  Negative for cough and shortness of breath.   Psychiatric/Behavioral:  Negative for sleep disturbance.    Vitals:   03/09/21 1609  BP: 124/72  Pulse: 83  Temp: 97.9 F (36.6 C)  SpO2: 98%     Physical  Exam Constitutional:      Appearance: She is obese.  HENT:     Head: Normocephalic.     Right Ear: Tympanic membrane normal.     Nose: No congestion.     Mouth/Throat:     Mouth: Mucous membranes are moist.  Eyes:     Pupils: Pupils are equal, round, and reactive to light.  Cardiovascular:     Rate and Rhythm: Normal rate and regular rhythm.     Heart sounds: No murmur heard.   No friction rub.  Pulmonary:     Effort: No respiratory distress.     Breath sounds: No stridor. No wheezing or rhonchi.  Musculoskeletal:     Cervical back: No  rigidity or tenderness.  Neurological:     Mental Status: She is alert.  Psychiatric:        Mood and Affect: Mood normal.   Data Reviewed: She had a CT scan performed in March showing groundglass changes, emphysema-report noted in care everywhere  PFT shows mild obstructive disease, mild reduction in diffusing capacity  Assessment:  Chronic obstructive pulmonary disease -Symptoms remain relatively stable  Active smoker -Counseled about the need to quit smoking  Shortness of breath on exertion  Trelegy use daily  Surgical outcomes definitely better with her not actively smoking  Plan/Recommendations:  Continue Trelegy  Albuterol as needed  Obtain chest x-ray today  Smoking cessation counseling   Tentative follow-up in 6 months   Sherrilyn Rist MD [facility] 03/09/2021, 4:19 PM  CC: Drue Flirt, MD

## 2021-03-12 ENCOUNTER — Encounter (HOSPITAL_BASED_OUTPATIENT_CLINIC_OR_DEPARTMENT_OTHER): Payer: Self-pay | Admitting: Orthopedic Surgery

## 2021-03-12 ENCOUNTER — Other Ambulatory Visit: Payer: Self-pay

## 2021-03-12 NOTE — Congregational Nurse Program (Signed)
Chart reviewed with Dr. Sabra Heck, anesthesiologist. Per aforesaid, okay to continue with plan for surgery, but patient may be cancelled DOS, depending on respiratory status. Will make patient aware.

## 2021-03-13 ENCOUNTER — Encounter (HOSPITAL_BASED_OUTPATIENT_CLINIC_OR_DEPARTMENT_OTHER)
Admission: RE | Admit: 2021-03-13 | Discharge: 2021-03-13 | Disposition: A | Discharge status: Home / Self Care | Payer: Medicare Other | Source: Ambulatory Visit | Attending: Orthopedic Surgery | Admitting: Orthopedic Surgery

## 2021-03-13 DIAGNOSIS — Z01818 Encounter for other preprocedural examination: Secondary | ICD-10-CM | POA: Diagnosis present

## 2021-03-13 DIAGNOSIS — E1169 Type 2 diabetes mellitus with other specified complication: Secondary | ICD-10-CM | POA: Diagnosis not present

## 2021-03-13 LAB — BASIC METABOLIC PANEL
Anion gap: 10 (ref 5–15)
BUN: 12 mg/dL (ref 6–20)
CO2: 26 mmol/L (ref 22–32)
Calcium: 9.9 mg/dL (ref 8.9–10.3)
Chloride: 103 mmol/L (ref 98–111)
Creatinine, Ser: 0.76 mg/dL (ref 0.44–1.00)
GFR, Estimated: >60 mL/min (ref >60)
Glucose, Bld: 181 mg/dL — ABNORMAL HIGH (ref 70–99)
Potassium: 4.2 mmol/L (ref 3.5–5.1)
Sodium: 139 mmol/L (ref 135–145)

## 2021-03-16 ENCOUNTER — Ambulatory Visit
Admission: RE | Admit: 2021-03-16 | Discharge: 2021-03-16 | Disposition: A | Discharge status: Home / Self Care | Payer: Medicare Other | Source: Ambulatory Visit | Attending: Family Medicine | Admitting: Family Medicine

## 2021-03-16 DIAGNOSIS — R928 Other abnormal and inconclusive findings on diagnostic imaging of breast: Secondary | ICD-10-CM

## 2021-03-19 ENCOUNTER — Other Ambulatory Visit: Payer: Self-pay

## 2021-03-19 ENCOUNTER — Ambulatory Visit (HOSPITAL_BASED_OUTPATIENT_CLINIC_OR_DEPARTMENT_OTHER): Payer: Medicare Other | Admitting: Anesthesiology

## 2021-03-19 ENCOUNTER — Encounter (HOSPITAL_BASED_OUTPATIENT_CLINIC_OR_DEPARTMENT_OTHER): Payer: Self-pay | Admitting: Orthopedic Surgery

## 2021-03-19 ENCOUNTER — Encounter (HOSPITAL_BASED_OUTPATIENT_CLINIC_OR_DEPARTMENT_OTHER)
Admission: RE | Disposition: A | Discharge status: Home / Self Care | Payer: Self-pay | Source: Home / Self Care | Attending: Orthopedic Surgery

## 2021-03-19 ENCOUNTER — Ambulatory Visit (HOSPITAL_BASED_OUTPATIENT_CLINIC_OR_DEPARTMENT_OTHER)
Admission: RE | Admit: 2021-03-19 | Discharge: 2021-03-19 | Disposition: A | Discharge status: Home / Self Care | Payer: Medicare Other | Attending: Orthopedic Surgery | Admitting: Orthopedic Surgery

## 2021-03-19 DIAGNOSIS — E119 Type 2 diabetes mellitus without complications: Secondary | ICD-10-CM | POA: Diagnosis not present

## 2021-03-19 DIAGNOSIS — F17210 Nicotine dependence, cigarettes, uncomplicated: Secondary | ICD-10-CM | POA: Diagnosis not present

## 2021-03-19 DIAGNOSIS — M65351 Trigger finger, right little finger: Secondary | ICD-10-CM | POA: Diagnosis present

## 2021-03-19 DIAGNOSIS — I10 Essential (primary) hypertension: Secondary | ICD-10-CM | POA: Insufficient documentation

## 2021-03-19 DIAGNOSIS — Z79899 Other long term (current) drug therapy: Secondary | ICD-10-CM | POA: Diagnosis not present

## 2021-03-19 DIAGNOSIS — E1169 Type 2 diabetes mellitus with other specified complication: Secondary | ICD-10-CM

## 2021-03-19 HISTORY — DX: Chronic obstructive pulmonary disease, unspecified: J44.9

## 2021-03-19 HISTORY — PX: TRIGGER FINGER RELEASE: SHX641

## 2021-03-19 LAB — GLUCOSE, CAPILLARY
Glucose-Capillary: 109 mg/dL — ABNORMAL HIGH (ref 70–99)
Glucose-Capillary: 139 mg/dL — ABNORMAL HIGH (ref 70–99)

## 2021-03-19 SURGERY — RELEASE, A1 PULLEY, FOR TRIGGER FINGER
Anesthesia: Monitor Anesthesia Care | Site: Finger | Laterality: Right

## 2021-03-19 MED ORDER — ACETAMINOPHEN 500 MG PO TABS
1000.0000 mg | ORAL_TABLET | Freq: Once | ORAL | Status: AC
  Administered 2021-03-19: 11:00:00 1000 mg via ORAL

## 2021-03-19 MED ORDER — ACETAMINOPHEN 500 MG PO TABS
ORAL_TABLET | ORAL | Status: AC
  Filled 2021-03-19: qty 2

## 2021-03-19 MED ORDER — BUPIVACAINE HCL (PF) 0.25 % IJ SOLN
INTRAMUSCULAR | Status: DC | PRN
  Administered 2021-03-19: 6 mL

## 2021-03-19 MED ORDER — AMISULPRIDE (ANTIEMETIC) 5 MG/2ML IV SOLN: 10.0000 mg | Freq: Once | INTRAVENOUS | Status: DC | PRN

## 2021-03-19 MED ORDER — PROMETHAZINE HCL 25 MG/ML IJ SOLN
6.2500 mg | INTRAMUSCULAR | Status: DC | PRN
Start: 2021-03-19 — End: 2021-03-19

## 2021-03-19 MED ORDER — PROPOFOL 10 MG/ML IV BOLUS
INTRAVENOUS | Status: DC | PRN
  Administered 2021-03-19: 50 mg via INTRAVENOUS

## 2021-03-19 MED ORDER — PROPOFOL 10 MG/ML IV BOLUS
INTRAVENOUS | Status: AC
  Filled 2021-03-19: qty 20

## 2021-03-19 MED ORDER — 0.9 % SODIUM CHLORIDE (POUR BTL) OPTIME
TOPICAL | Status: DC | PRN
  Administered 2021-03-19: 14:00:00 100 mL

## 2021-03-19 MED ORDER — FENTANYL CITRATE (PF) 100 MCG/2ML IJ SOLN
INTRAMUSCULAR | Status: DC | PRN
  Administered 2021-03-19 (×2): 50 ug via INTRAVENOUS

## 2021-03-19 MED ORDER — MIDAZOLAM HCL 5 MG/5ML IJ SOLN
INTRAMUSCULAR | Status: DC | PRN
Start: 2021-03-19 — End: 2021-03-19
  Administered 2021-03-19: 2 mg via INTRAVENOUS

## 2021-03-19 MED ORDER — CEFAZOLIN SODIUM-DEXTROSE 2-4 GM/100ML-% IV SOLN
INTRAVENOUS | Status: AC
  Filled 2021-03-19: qty 100

## 2021-03-19 MED ORDER — MIDAZOLAM HCL 2 MG/2ML IJ SOLN
INTRAMUSCULAR | Status: AC
  Filled 2021-03-19: qty 2

## 2021-03-19 MED ORDER — CEFAZOLIN SODIUM-DEXTROSE 2-4 GM/100ML-% IV SOLN
2.0000 g | INTRAVENOUS | Status: AC
  Administered 2021-03-19: 13:00:00 2 g via INTRAVENOUS

## 2021-03-19 MED ORDER — LACTATED RINGERS IV SOLN: INTRAVENOUS | Status: DC

## 2021-03-19 MED ORDER — LIDOCAINE 2% (20 MG/ML) 5 ML SYRINGE
INTRAMUSCULAR | Status: AC
  Filled 2021-03-19: qty 5

## 2021-03-19 MED ORDER — FENTANYL CITRATE (PF) 100 MCG/2ML IJ SOLN
INTRAMUSCULAR | Status: AC
  Filled 2021-03-19: qty 2

## 2021-03-19 MED ORDER — FENTANYL CITRATE (PF) 100 MCG/2ML IJ SOLN: 25.0000 ug | INTRAMUSCULAR | Status: DC | PRN

## 2021-03-19 MED ORDER — HYDROCODONE-ACETAMINOPHEN 5-325 MG PO TABS: ORAL_TABLET | ORAL | 0 refills | Status: DC

## 2021-03-19 MED ORDER — ONDANSETRON HCL 4 MG/2ML IJ SOLN
INTRAMUSCULAR | Status: DC | PRN
  Administered 2021-03-19: 4 mg via INTRAVENOUS

## 2021-03-19 MED ORDER — LIDOCAINE HCL (PF) 0.5 % IJ SOLN
INTRAMUSCULAR | Status: DC | PRN
  Administered 2021-03-19: 30 mL via INTRAVENOUS

## 2021-03-19 SURGICAL SUPPLY — 32 items
APL PRP STRL LF DISP 70% ISPRP (MISCELLANEOUS) ×1
BLADE SURG 15 STRL LF DISP TIS (BLADE) ×2 IMPLANT
BLADE SURG 15 STRL SS (BLADE) ×6
BNDG CMPR 5X2 CHSV 1 LYR STRL (GAUZE/BANDAGES/DRESSINGS) ×1
BNDG CMPR 9X4 STRL LF SNTH (GAUZE/BANDAGES/DRESSINGS)
BNDG COHESIVE 2X5 TAN ST LF (GAUZE/BANDAGES/DRESSINGS) ×3 IMPLANT
BNDG ESMARK 4X9 LF (GAUZE/BANDAGES/DRESSINGS) IMPLANT
CHLORAPREP W/TINT 26 (MISCELLANEOUS) ×3 IMPLANT
CORD BIPOLAR FORCEPS 12FT (ELECTRODE) ×3 IMPLANT
COVER BACK TABLE 60X90IN (DRAPES) ×3 IMPLANT
COVER MAYO STAND STRL (DRAPES) ×3 IMPLANT
CUFF TOURN SGL QUICK 18X4 (TOURNIQUET CUFF) ×3 IMPLANT
DRAPE EXTREMITY T 121X128X90 (DISPOSABLE) ×3 IMPLANT
DRAPE SURG 17X23 STRL (DRAPES) ×3 IMPLANT
GAUZE SPONGE 4X4 12PLY STRL (GAUZE/BANDAGES/DRESSINGS) ×3 IMPLANT
GAUZE XEROFORM 1X8 LF (GAUZE/BANDAGES/DRESSINGS) ×3 IMPLANT
GLOVE SRG 8 PF TXTR STRL LF DI (GLOVE) ×1 IMPLANT
GLOVE SURG ENC MOIS LTX SZ7.5 (GLOVE) ×3 IMPLANT
GLOVE SURG UNDER POLY LF SZ8 (GLOVE) ×3
GOWN STRL REUS W/ TWL LRG LVL3 (GOWN DISPOSABLE) ×1 IMPLANT
GOWN STRL REUS W/TWL LRG LVL3 (GOWN DISPOSABLE) ×3
GOWN STRL REUS W/TWL XL LVL3 (GOWN DISPOSABLE) ×3 IMPLANT
NDL HYPO 25X1 1.5 SAFETY (NEEDLE) ×1 IMPLANT
NEEDLE HYPO 25X1 1.5 SAFETY (NEEDLE) ×3 IMPLANT
NS IRRIG 1000ML POUR BTL (IV SOLUTION) ×3 IMPLANT
PACK BASIN DAY SURGERY FS (CUSTOM PROCEDURE TRAY) ×3 IMPLANT
STOCKINETTE 4X48 STRL (DRAPES) ×3 IMPLANT
SUT ETHILON 4 0 PS 2 18 (SUTURE) ×3 IMPLANT
SYR BULB EAR ULCER 3OZ GRN STR (SYRINGE) ×3 IMPLANT
SYR CONTROL 10ML LL (SYRINGE) ×3 IMPLANT
TOWEL GREEN STERILE FF (TOWEL DISPOSABLE) ×6 IMPLANT
UNDERPAD 30X36 HEAVY ABSORB (UNDERPADS AND DIAPERS) ×3 IMPLANT

## 2021-03-19 NOTE — Discharge Instructions (Addendum)
No Tylenol until 5:22 pm   Post Anesthesia Home Care Instructions  Activity: Get plenty of rest for the remainder of the day. A responsible individual must stay with you for 24 hours following the procedure.  For the next 24 hours, DO NOT: -Drive a car -Paediatric nurse -Drink alcoholic beverages -Take any medication unless instructed by your physician -Make any legal decisions or sign important papers.  Meals: Start with liquid foods such as gelatin or soup. Progress to regular foods as tolerated. Avoid greasy, spicy, heavy foods. If nausea and/or vomiting occur, drink only clear liquids until the nausea and/or vomiting subsides. Call your physician if vomiting continues.  Special Instructions/Symptoms: Your throat may feel dry or sore from the anesthesia or the breathing tube placed in your throat during surgery. If this causes discomfort, gargle with warm salt water. The discomfort should disappear within 24 hours.  If you had a scopolamine patch placed behind your ear for the management of post- operative nausea and/or vomiting:  1. The medication in the patch is effective for 72 hours, after which it should be removed.  Wrap patch in a tissue and discard in the trash. Wash hands thoroughly with soap and water. 2. You may remove the patch earlier than 72 hours if you experience unpleasant side effects which may include dry mouth, dizziness or visual disturbances. 3. Avoid touching the patch. Wash your hands with soap and water after contact with the patch.      [facility] Instructions Hand Surgery  Wound Care: Keep your hand elevated above the level of your heart.  Do not allow it to dangle by your side.  Keep the dressing dry and do not remove it unless your doctor advises you to do so.  He will usually change it at the time of your post-op visit.  Moving your fingers is advised to stimulate circulation but will depend on the site of your surgery.  If you have a splint applied,  your doctor will advise you regarding movement.  Activity: Do not drive or operate machinery today.  Rest today and then you may return to your normal activity and work as indicated by your physician.  Diet:  Drink liquids today or eat a light diet.  You may resume a regular diet tomorrow.    General expectations: Pain for two to three days. Fingers may become slightly swollen.  Call your doctor if any of the following occur: Severe pain not relieved by pain medication. Elevated temperature. Dressing soaked with blood. Inability to move fingers. White or bluish color to fingers.

## 2021-03-19 NOTE — Transfer of Care (Signed)
Immediate Anesthesia Transfer of Care Note  Patient: Alisha Espinoza  Procedure(s) Performed: RELEASE TRIGGER FINGER/A-1 PULLEY RIGHT SMALL FINGER (Right: Finger)  Patient Location: PACU  Anesthesia Type:MAC  Level of Consciousness: awake, alert , oriented and patient cooperative  Airway & Oxygen Therapy: Patient Spontanous Breathing and Patient connected to face mask oxygen  Post-op Assessment: Report given to RN, Post -op Vital signs reviewed and stable and Patient moving all extremities X 4  Post vital signs: Reviewed and stable  Last Vitals:  Vitals Value Taken Time  BP 111/75 03/19/21 1352  Temp 36.5 C 03/19/21 1353  Pulse 77 03/19/21 1354  Resp 20 03/19/21 1354  SpO2 96 % 03/19/21 1354  Vitals shown include unvalidated device data.  Last Pain:  Vitals:   03/19/21 1113  TempSrc: Oral  PainSc: 0-No pain         Complications: No notable events documented.

## 2021-03-19 NOTE — Anesthesia Preprocedure Evaluation (Addendum)
Anesthesia Evaluation  Patient identified by MRN, date of birth, ID band Patient awake    Reviewed: Allergy & Precautions, NPO status , Patient's Chart, lab work & pertinent test results  History of Anesthesia Complications Negative for: history of anesthetic complications  Airway Mallampati: II  TM Distance: >3 FB Neck ROM: Full    Dental no notable dental hx. (+) Dental Advisory Given   Pulmonary neg pulmonary ROS, Current Smoker and Patient abstained from smoking.,    Pulmonary exam normal        Cardiovascular hypertension, Pt. on medications Normal cardiovascular exam     Neuro/Psych negative neurological ROS  negative psych ROS   GI/Hepatic negative GI ROS, Neg liver ROS,   Endo/Other  diabetes  Renal/GU negative Renal ROS  negative genitourinary   Musculoskeletal negative musculoskeletal ROS (+)   Abdominal   Peds  Hematology negative hematology ROS (+)   Anesthesia Other Findings   Reproductive/Obstetrics                           Anesthesia Physical  Anesthesia Plan  ASA: 2  Anesthesia Plan: MAC and Bier Block and Bier Block-LIDOCAINE ONLY   Post-op Pain Management:    Induction: Intravenous  PONV Risk Score and Plan: 2 and Propofol infusion, TIVA, Treatment may vary due to age or medical condition, Midazolam and Ondansetron  Airway Management Planned: Natural Airway, Nasal Cannula and Simple Face Mask  Additional Equipment: None  Intra-op Plan:   Post-operative Plan:   Informed Consent: I have reviewed the patients History and Physical, chart, labs and discussed the procedure including the risks, benefits and alternatives for the proposed anesthesia with the patient or authorized representative who has indicated his/her understanding and acceptance.     Dental advisory given  Plan Discussed with: Anesthesiologist and CRNA  Anesthesia Plan Comments:        Anesthesia Quick Evaluation

## 2021-03-19 NOTE — H&P (Signed)
Alisha Espinoza is an 55 y.o. female.   Chief Complaint: trigger digit HPI: 55 yo female with triggering right small finger.  This has been injected without lasting resolution.  She wishes to have trigger release.  Allergies:  Allergies  Allergen Reactions   Mobic [Meloxicam] Hives    Other reaction(s): Hives   Meloxicam Hives    Past Medical History:  Diagnosis Date   Bronchitis    COPD (chronic obstructive pulmonary disease) ([place])    Diabetes mellitus ([place]) 01/19/2019   Dyspnea    Heart murmur    resolved "closed by age 68."    Hyperlipidemia    Hypertension    Seasonal allergies     Past Surgical History:  Procedure Laterality Date   ABDOMINAL HYSTERECTOMY  2000   ANTERIOR CERVICAL DECOMP/DISCECTOMY FUSION  03/19/2012   Procedure: ANTERIOR CERVICAL DECOMPRESSION/DISCECTOMY FUSION 1 LEVEL;  Surgeon: Melina Schools, MD;  Location: [place];  Service: Orthopedics;  Laterality: Left;  Total Disc Replacement C4-5   ANTERIOR CERVICAL DECOMP/DISCECTOMY FUSION N/A 05/07/2012   Procedure: ANTERIOR CERVICAL DECOMPRESSION/DISCECTOMY FUSION 1 LEVEL/HARDWARE REMOVAL;  Surgeon: Melina Schools, MD;  Location: [place];  Service: Orthopedics;  Laterality: N/A;  REMOVAL OF CERVICAL DISC REPLACEMENT AND ACDF C4-5   BACK SURGERY     CARPAL TUNNEL RELEASE Right 09/28/2018   Procedure: CARPAL TUNNEL RELEASE;  Surgeon: Eustace Moore, MD;  Location: [place];  Service: Neurosurgery;  Laterality: Right;  CARPAL TUNNEL RELEASE   CERVICAL FUSION  05/07/2012   Dr Rolena Infante   COLONOSCOPY     CYSTECTOMY     L wrist   POLYPECTOMY     ROTATOR CUFF REPAIR     Right   TRIGGER FINGER RELEASE Left 05/03/2019   Procedure: LEFT LONG AND RING FINGER RELEASE TRIGGER FINGER/A-1 PULLEY;  Surgeon: Leanora Cover, MD;  Location: [place];  Service: Orthopedics;  Laterality: Left;  beir block   TRIGGER FINGER RELEASE Right 09/23/2019   Procedure: RELEASE TRIGGER FINGER/A-1 PULLEY RIGHT THUMB, LONG AND RING FINGERS;   Surgeon: Leanora Cover, MD;  Location: [place];  Service: Orthopedics;  Laterality: Right;   TYMPANOSTOMY TUBE PLACEMENT      Family History: Family History  Problem Relation Age of Onset   Diabetes Mother    Hypertension Father    Pancreatic cancer Sister    Colon cancer Neg Hx    Rectal cancer Neg Hx    Stomach cancer Neg Hx    Colon polyps Neg Hx    Esophageal cancer Neg Hx     Social History:   reports that she has been smoking cigarettes. She has a 15.00 pack-year smoking history. She has never used smokeless tobacco. She reports that she does not currently use alcohol. She reports that she does not use drugs.  Medications: Medications Prior to Admission  Medication Sig Dispense Refill   albuterol (VENTOLIN HFA) 108 (90 Base) MCG/ACT inhaler Inhale 2 puffs into the lungs every 4 (four) hours as needed for wheezing or shortness of breath (or coughing). 1 each 1   Fluticasone-Umeclidin-Vilant (TRELEGY ELLIPTA) 100-62.5-25 MCG/INH AEPB Inhale 1 puff into the lungs daily. 60 each 3   gabapentin (NEURONTIN) 400 MG capsule TAKE 1 CAPSULE(400 MG) BY MOUTH THREE TIMES DAILY 90 capsule 0   HYDROcodone-acetaminophen (NORCO/VICODIN) 5-325 MG tablet Take 1 tablet by mouth 2 (two) times daily as needed for moderate pain. 60 tablet 0   ibuprofen (ADVIL) 800 MG tablet TAKE 1 TABLET(800 MG) BY  MOUTH EVERY 8 HOURS AS NEEDED FOR MODERATE PAIN 90 tablet 3   LINZESS 290 MCG CAPS capsule TAKE 1 CAPSULE(290 MCG) BY MOUTH DAILY BEFORE BREAKFAST 30 capsule 5   metFORMIN (GLUCOPHAGE) 500 MG tablet Take 500 mg by mouth 2 (two) times daily.     PREDNISONE PO Take by mouth 3 (three) times daily.     simvastatin (ZOCOR) 20 MG tablet Take 20 mg by mouth daily.  1   tretinoin (RETIN-A) 0.1 % cream Apply 1 application topically at bedtime.      valsartan-hydrochlorothiazide (DIOVAN-HCT) 160-12.5 MG tablet Take 1 tablet by mouth daily.     Blood Glucose Monitoring Suppl (CONTOUR NEXT MONITOR)  w/Device KIT 1 each by Does not apply route daily. Use to test blood sugars 1-2 times daily. 1 kit 0   glucose blood (CONTOUR NEXT TEST) test strip Use to test 1-2 times daily. 100 each 12   Microlet Lancets MISC USE TO TEST BLOOD SUGARS ONCE TO BID      Results for orders placed or performed during the hospital encounter of 03/19/21 (from the past 48 hour(s))  Glucose, capillary     Status: Abnormal   Collection Time: 03/19/21 11:18 AM  Result Value Ref Range   Glucose-Capillary 109 (H) 70 - 99 mg/dL    Comment: Glucose reference range applies only to samples taken after fasting for at least 8 hours.    No results found.    Blood pressure 103/82, pulse 83, temperature (!) 97.5 F (36.4 C), temperature source Oral, resp. rate 18, height '5\' 6"'  (1.676 m), weight 88.5 kg, SpO2 97 %.  General appearance: alert, cooperative, and appears stated age Head: Normocephalic, without obvious abnormality, atraumatic Neck: supple, symmetrical, trachea midline Cardio: regular rate and rhythm Resp: clear to auscultation bilaterally Extremities: Intact sensation and capillary refill all digits.  +epl/fpl/io.  No wounds.  Pulses: 2+ and symmetric Skin: Skin color, texture, turgor normal. No rashes or lesions Neurologic: Grossly normal Incision/Wound: none  Assessment/Plan Right small trigger digit.  Non operative and operative treatment options have been discussed with the patient and patient wishes to proceed with operative treatment. Risks, benefits, and alternatives of surgery have been discussed and the patient agrees with the plan of care.   Leanora Cover 03/19/2021, 12:32 PM

## 2021-03-19 NOTE — Op Note (Signed)
03/19/2021 [facility]  Operative Note  PREOPERATIVE DIAGNOSIS: RIGHT SMALL TRIGGER FINGER  POSTOPERATIVE DIAGNOSIS:  RIGHT SMALL TRIGGER FINGER  PROCEDURE: Procedure(s): RELEASE TRIGGER FINGER/A-1 PULLEY RIGHT SMALL FINGER little finger  SURGEON:  Leanora Cover, MD  ASSISTANT:  none.  ANESTHESIA:  Bier block with sedation.  IV FLUIDS:  Per anesthesia flow sheet.  ESTIMATED BLOOD LOSS:  Minimal.  COMPLICATIONS:  None.  SPECIMENS:  None.  TOURNIQUET TIME:  Total Tourniquet Time Documented: Forearm (Right) - 21 minutes Total: Forearm (Right) - 21 minutes   DISPOSITION:  Stable to PACU.  LOCATION: [facility]  INDICATIONS: Alisha Espinoza is a 55 y.o. female with trigging right small finger.  She wishes to have right small finger trigger release.  Risks, benefits and alternatives of surgery were discussed including the risk of blood loss, infection, damage to nerves, vessels, tendons, ligaments, bone, failure of surgery, need for additional surgery, complications with wound healing, continued pain, continued triggering and need for repeat surgery.  She voiced understanding of these risks and elected to proceed.  OPERATIVE COURSE:  After being identified preoperatively by myself, the patient and I agreed upon the procedure and site of procedure.  The surgical site was marked. Surgical consent had been signed. She was given preoperative IV antibiotic prophylaxis. She was transported to the operating room and placed on the operating room table in supine position with the Right upper extremity on an arm board. Bier block anesthesia was induced by the anesthesiologist.  The Right upper extremity was prepped and draped in normal sterile orthopedic fashion. A surgical pause was performed between surgeons, anesthesia, and operating room staff, and all were in agreement as to the patient, procedure, and site of procedure.  Tourniquet at the proximal aspect of the  forearm had been inflated for the Bier block.  An incision was made at the volar aspect of the MP joint of the little finger.  This was carried into the subcutaneous tissues by spreading technique.  Bipolar electrocautery was used to obtain hemostasis.  The radial and ulnar digital nerves were protected throughout the case. The flexor sheath was identified.  The A1 pulley was identified and sharply incised.  It was released in its entirety.  The proximal 1-2 mm of the A2 pulley was vented to allow better excursion of the tendons.  The finger was placed through a range of motion and there was noted to be no catching.  The tendons were brought through the wound and any adherences released.  The wound was then copiously irrigated with sterile saline. It was closed with 4-0 nylon in a horizontal mattress fashion.  It was injected with 0.25% plain Marcaine to aid in postoperative analgesia.  It was dressed with sterile Xeroform, 4x4s, and wrapped lightly with a Coban dressing.  Tourniquet was deflated at 21 minutes.  The fingertips were pink with brisk capillary refill after deflation of the tourniquet.  The operative drapes were broken down and the patient was awoken from anesthesia safely.  She was transferred back to the stretcher and taken to the PACU in stable condition.   I will see her back in the office in 1 week for postoperative followup.  I will give her a prescription for Norco 5/325 1-2 tabs PO q6 hours prn pain, dispense # 15.    Leanora Cover, MD Electronically signed, 03/19/21

## 2021-03-19 NOTE — Anesthesia Postprocedure Evaluation (Signed)
Anesthesia Post Note  Patient: Alisha Espinoza  Procedure(s) Performed: RELEASE TRIGGER FINGER/A-1 PULLEY RIGHT SMALL FINGER (Right: Finger)     Patient location during evaluation: PACU Anesthesia Type: MAC Level of consciousness: awake and alert Pain management: pain level controlled Vital Signs Assessment: post-procedure vital signs reviewed and stable Respiratory status: spontaneous breathing and respiratory function stable Cardiovascular status: stable Postop Assessment: no apparent nausea or vomiting Anesthetic complications: no   No notable events documented.  Last Vitals:  Vitals:   03/19/21 1417 03/19/21 1430  BP: 120/83 109/83  Pulse: 79 73  Resp: 20 16  Temp:  36.4 C  SpO2: 93% 94%    Last Pain:  Vitals:   03/19/21 1430  TempSrc:   PainSc: 0-No pain                 Dorsie Sethi DANIEL

## 2021-03-20 ENCOUNTER — Encounter (HOSPITAL_BASED_OUTPATIENT_CLINIC_OR_DEPARTMENT_OTHER): Payer: Self-pay | Admitting: Orthopedic Surgery

## 2021-03-20 IMAGING — MG DIGITAL DIAGNOSTIC BILATERAL MAMMOGRAM WITH TOMO AND CAD
8 series · 8 of 24 positions shown · non-contrast
Comparison: Previous exam(s).

ACR Breast Density Category a: The breast tissue is almost entirely
fatty.

CLINICAL DATA: 1+ year interval follow-up of a likely benign
clustered cysts or complex cyst involving the UPPER INNER QUADRANT
of the RIGHT breast. Annual evaluation, LEFT breast.

EXAM:
DIGITAL DIAGNOSTIC BILATERAL MAMMOGRAM WITH CAD AND TOMO
ULTRASOUND RIGHT BREAST

[L MLO synth-2D]
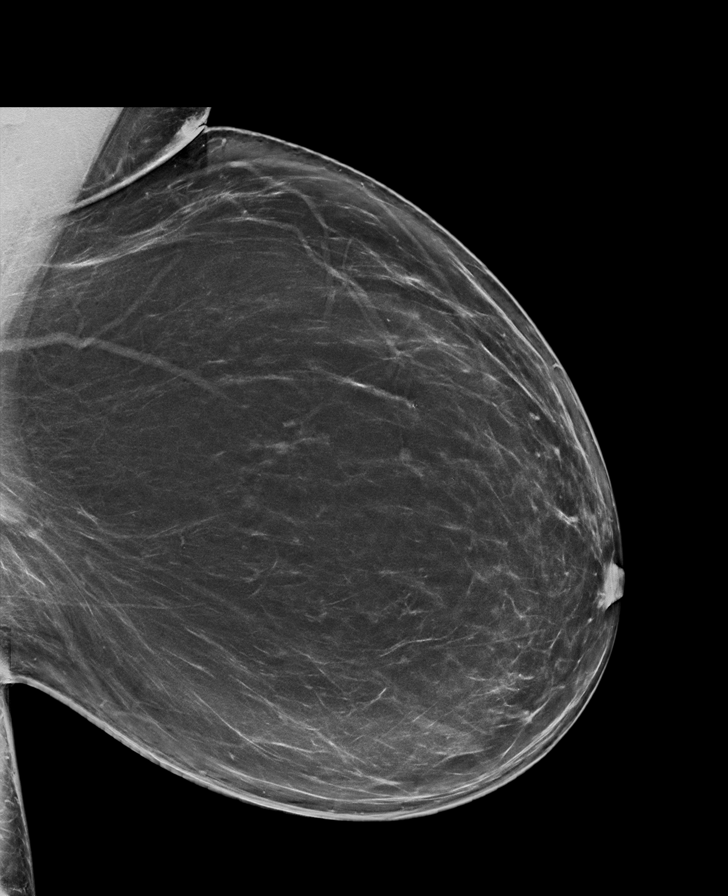

[R MLO synth-2D]
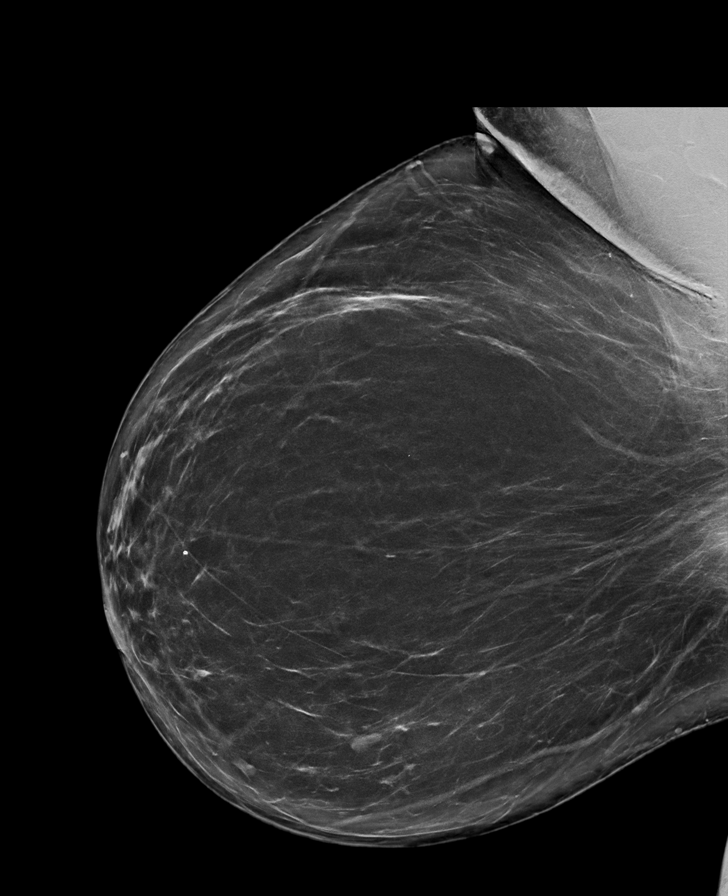

[L CC synth-2D]
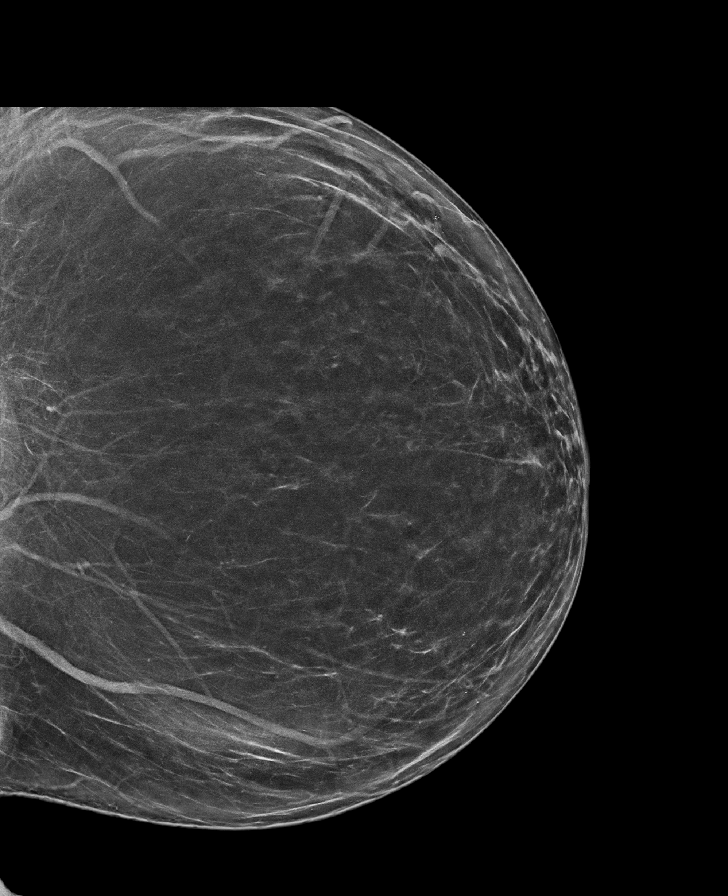

[R CC synth-2D]
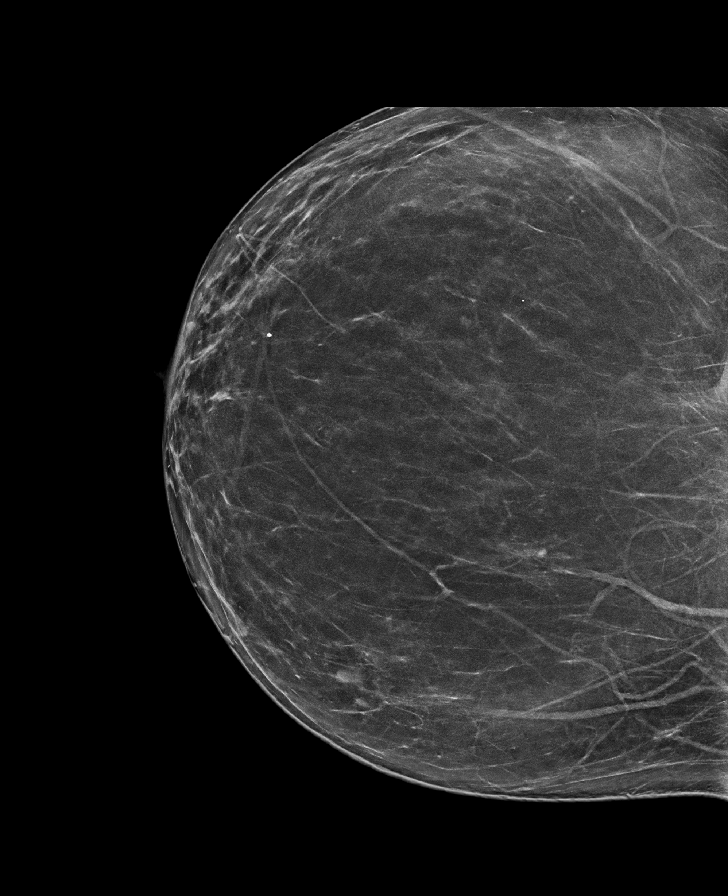

[L CC tomo · tomo slice 43/84.0]
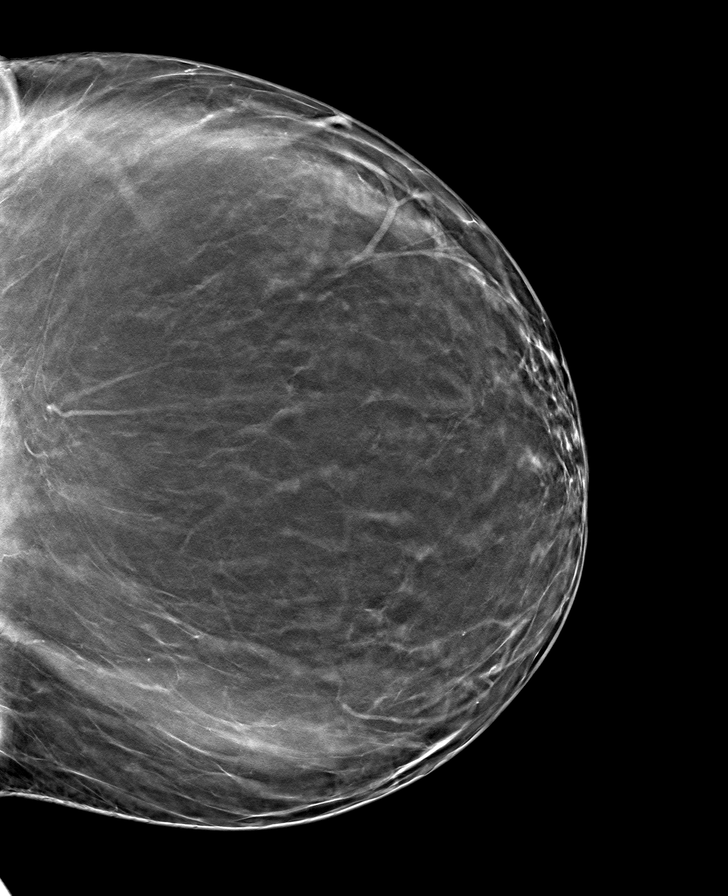

[L MLO tomo · tomo slice 49/98.0]
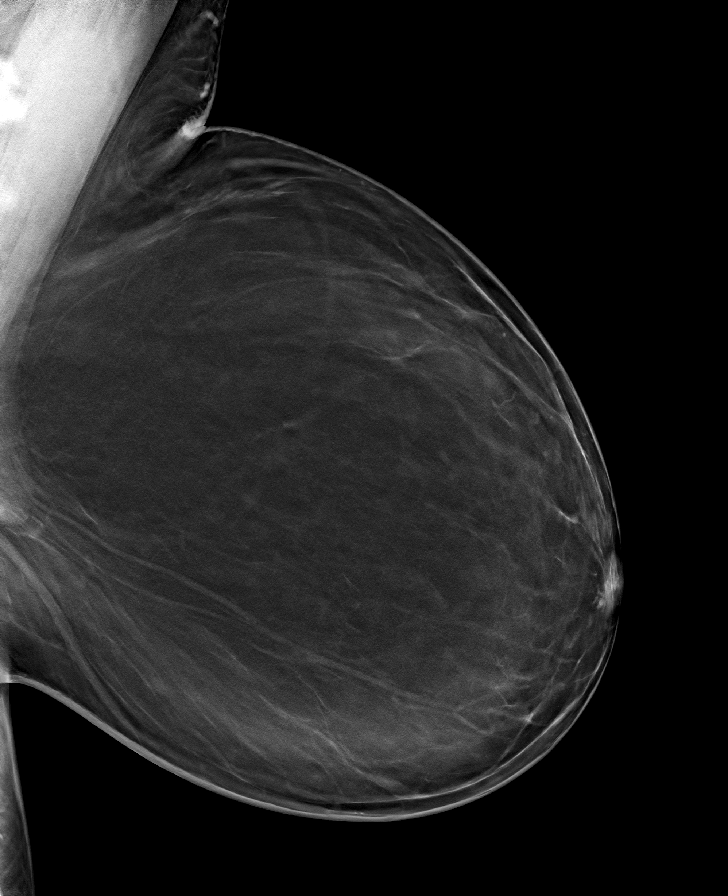

[R MLO tomo · tomo slice 49/98.0]
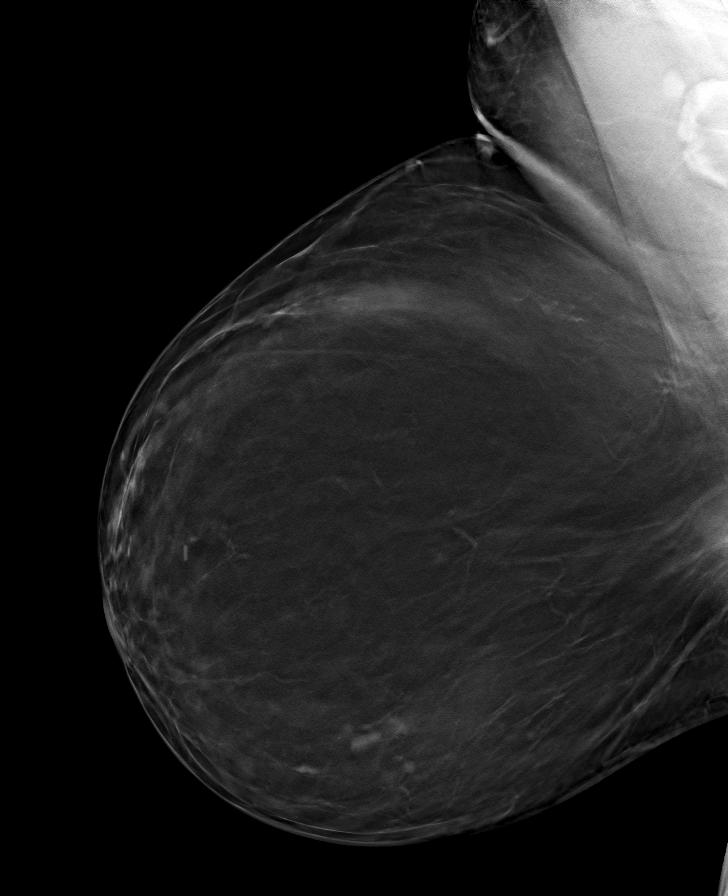

[R CC tomo · tomo slice 41/80.0]
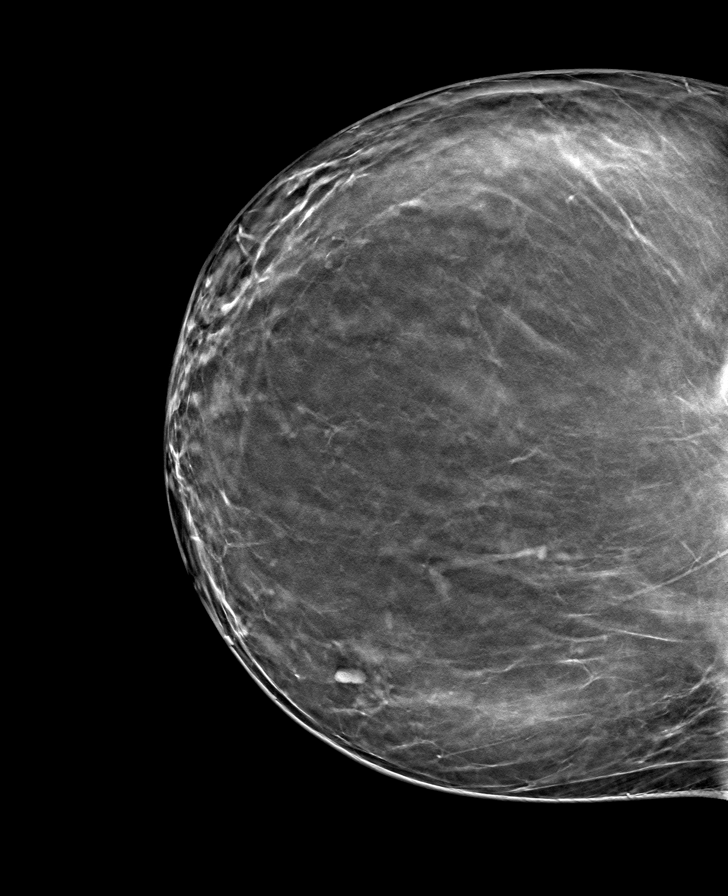

[8 of 24 positions shown; findings below may reference images not displayed]

FINDINGS: Tomosynthesis and synthesized full field CC and MLO views of both
breasts were obtained.

The circumscribed oval bilobed low to medium density mass in the
UPPER INNER RIGHT breast at ANTERIOR to MIDDLE depth measures
approximately 9 mm, unchanged from the mammogram in June 2017. No
new or suspicious findings elsewhere in the RIGHT breast.

No findings suspicious for malignancy in the LEFT breast.

Mammographic images were processed with CAD.

Targeted RIGHT breast ultrasound is performed, showing the
previously identified circumscribed oval parallel nearly anechoic
mass with a thin internal septation at the 2:30 o'clock position
approximately 5 cm from nipple measuring approximately 4 x 7 x 6 mm
(previously 5 x 7 x 7 mm on the 02/11/2018 ultrasound).
IMPRESSION: 1. Stable likely benign mildly complex cyst involving the UPPER
INNER RIGHT breast at ANTERIOR to MIDDLE depth.
2. No mammographic evidence of malignancy involving the LEFT breast.

RECOMMENDATION:
RIGHT breast ultrasound at the time of annual BILATERAL diagnostic
mammography in 1 year in order to confirm 2 years of stability of
the likely benign RIGHT breast mass.

I have discussed the findings and recommendations with the patient.
If applicable, a reminder letter will be sent to the patient
regarding the next appointment.

BI-RADS CATEGORY  3: Probably benign.

## 2021-03-28 ENCOUNTER — Other Ambulatory Visit: Payer: Self-pay | Admitting: Physical Medicine and Rehabilitation

## 2021-03-30 ENCOUNTER — Encounter (HOSPITAL_BASED_OUTPATIENT_CLINIC_OR_DEPARTMENT_OTHER): Payer: Medicare Other | Admitting: Physical Medicine and Rehabilitation

## 2021-03-30 ENCOUNTER — Other Ambulatory Visit: Payer: Self-pay

## 2021-03-30 DIAGNOSIS — M961 Postlaminectomy syndrome, not elsewhere classified: Secondary | ICD-10-CM

## 2021-03-30 MED ORDER — GABAPENTIN 600 MG PO TABS: 600.0000 mg | ORAL_TABLET | Freq: Three times a day (TID) | ORAL | 3 refills | Status: AC

## 2021-03-30 NOTE — Progress Notes (Signed)
Subjective:    Patient ID: Alisha Espinoza, female    DOB: [birth year], 55 y.o.   MRN: [id number]  HPI An audio/video tele-health visit is felt to be the most appropriate encounter for this patient at this time. This is a follow up tele-visit via phone. The patient is at home. MD is at office.     Alisha Espinoza is a 55 year old woman who presents for follow-up of low back pain  1) Low back pain -continues to hurt.  -does not need any refills today -had good benefit from [facility] in the past -sometimes moves into the leg -she is taking Cymbalta every day -gabapentin does help -pain has been severe recently, radiating into both legs -she is scheduled for spinal cord stimulator.   2) muscle pain in legs -improved with either cymbalta and steroids- improved to 2  3) Bilateral hip pain -started yesterday  Pain Inventory Average Pain 8 Pain Right Now 9 My pain is intermittent, sharp, stabbing, tingling, and aching  In the last 24 hours, has pain interfered with the following? General activity 9 Relation with others 10 Enjoyment of life 10 What TIME of day is your pain at its worst? morning , daytime, evening, and night Sleep (in general) Poor  Pain is worse with: walking, bending, sitting, inactivity, standing, and some activites Pain improves with: rest, heat/ice, and medication Relief from Meds: 3  Family History  Problem Relation Age of Onset   Diabetes Mother    Hypertension Father    Pancreatic cancer Sister    Colon cancer Neg Hx    Rectal cancer Neg Hx    Stomach cancer Neg Hx    Colon polyps Neg Hx    Esophageal cancer Neg Hx    Social History   Socioeconomic History   Marital status: Divorced    Spouse name: Not on file   Number of children: Not on file   Years of education: Not on file   Highest education level: Not on file  Occupational History   Not on file  Tobacco Use   Smoking status: Every Day    Packs/day: 0.50    Years: 30.00    Pack years:  15.00    Types: Cigarettes   Smokeless tobacco: Never   Tobacco comments:    10cigs/day  Vaping Use   Vaping Use: Never used  Substance and Sexual Activity   Alcohol use: Not Currently    Comment: occasionally   Drug use: No   Sexual activity: Yes    Birth control/protection: Surgical    Comment: Hysterectomy  Other Topics Concern   Not on file  Social History Narrative   Not on file   Social Determinants of Health   Financial Resource Strain: Not on file  Food Insecurity: Not on file  Transportation Needs: Not on file  Physical Activity: Not on file  Stress: Not on file  Social Connections: Not on file   Past Surgical History:  Procedure Laterality Date   ABDOMINAL HYSTERECTOMY  2000   ANTERIOR CERVICAL DECOMP/DISCECTOMY FUSION  03/19/2012   Procedure: ANTERIOR CERVICAL DECOMPRESSION/DISCECTOMY FUSION 1 LEVEL;  Surgeon: Melina Schools, MD;  Location: [place];  Service: Orthopedics;  Laterality: Left;  Total Disc Replacement C4-5   ANTERIOR CERVICAL DECOMP/DISCECTOMY FUSION N/A 05/07/2012   Procedure: ANTERIOR CERVICAL DECOMPRESSION/DISCECTOMY FUSION 1 LEVEL/HARDWARE REMOVAL;  Surgeon: Melina Schools, MD;  Location: [place];  Service: Orthopedics;  Laterality: N/A;  REMOVAL OF CERVICAL DISC REPLACEMENT AND ACDF C4-5  BACK SURGERY     CARPAL TUNNEL RELEASE Right 09/28/2018   Procedure: CARPAL TUNNEL RELEASE;  Surgeon: Eustace Moore, MD;  Location: [place];  Service: Neurosurgery;  Laterality: Right;  CARPAL TUNNEL RELEASE   CERVICAL FUSION  05/07/2012   Dr Rolena Infante   COLONOSCOPY     CYSTECTOMY     L wrist   POLYPECTOMY     ROTATOR CUFF REPAIR     Right   TRIGGER FINGER RELEASE Left 05/03/2019   Procedure: LEFT LONG AND RING FINGER RELEASE TRIGGER FINGER/A-1 PULLEY;  Surgeon: Leanora Cover, MD;  Location: [place];  Service: Orthopedics;  Laterality: Left;  beir block   TRIGGER FINGER RELEASE Right 09/23/2019   Procedure: RELEASE TRIGGER FINGER/A-1 PULLEY RIGHT THUMB,  LONG AND RING FINGERS;  Surgeon: Leanora Cover, MD;  Location: [place];  Service: Orthopedics;  Laterality: Right;   TRIGGER FINGER RELEASE Right 03/19/2021   Procedure: RELEASE TRIGGER FINGER/A-1 PULLEY RIGHT SMALL FINGER;  Surgeon: Leanora Cover, MD;  Location: [place];  Service: Orthopedics;  Laterality: Right;  30 MIN   TYMPANOSTOMY TUBE PLACEMENT     Past Surgical History:  Procedure Laterality Date   ABDOMINAL HYSTERECTOMY  2000   ANTERIOR CERVICAL DECOMP/DISCECTOMY FUSION  03/19/2012   Procedure: ANTERIOR CERVICAL DECOMPRESSION/DISCECTOMY FUSION 1 LEVEL;  Surgeon: Melina Schools, MD;  Location: [place];  Service: Orthopedics;  Laterality: Left;  Total Disc Replacement C4-5   ANTERIOR CERVICAL DECOMP/DISCECTOMY FUSION N/A 05/07/2012   Procedure: ANTERIOR CERVICAL DECOMPRESSION/DISCECTOMY FUSION 1 LEVEL/HARDWARE REMOVAL;  Surgeon: Melina Schools, MD;  Location: [place];  Service: Orthopedics;  Laterality: N/A;  REMOVAL OF CERVICAL DISC REPLACEMENT AND ACDF C4-5   BACK SURGERY     CARPAL TUNNEL RELEASE Right 09/28/2018   Procedure: CARPAL TUNNEL RELEASE;  Surgeon: Eustace Moore, MD;  Location: [place];  Service: Neurosurgery;  Laterality: Right;  CARPAL TUNNEL RELEASE   CERVICAL FUSION  05/07/2012   Dr Rolena Infante   COLONOSCOPY     CYSTECTOMY     L wrist   POLYPECTOMY     ROTATOR CUFF REPAIR     Right   TRIGGER FINGER RELEASE Left 05/03/2019   Procedure: LEFT LONG AND RING FINGER RELEASE TRIGGER FINGER/A-1 PULLEY;  Surgeon: Leanora Cover, MD;  Location: [place];  Service: Orthopedics;  Laterality: Left;  beir block   TRIGGER FINGER RELEASE Right 09/23/2019   Procedure: RELEASE TRIGGER FINGER/A-1 PULLEY RIGHT THUMB, LONG AND RING FINGERS;  Surgeon: Leanora Cover, MD;  Location: [place];  Service: Orthopedics;  Laterality: Right;   TRIGGER FINGER RELEASE Right 03/19/2021   Procedure: RELEASE TRIGGER FINGER/A-1 PULLEY RIGHT SMALL FINGER;   Surgeon: Leanora Cover, MD;  Location: [place];  Service: Orthopedics;  Laterality: Right;  30 MIN   TYMPANOSTOMY TUBE PLACEMENT     Past Medical History:  Diagnosis Date   Bronchitis    COPD (chronic obstructive pulmonary disease) ([place])    Diabetes mellitus ([place]) 01/19/2019   Dyspnea    Heart murmur    resolved "closed by age 86."    Hyperlipidemia    Hypertension    Seasonal allergies    There were no vitals taken for this visit.  Opioid Risk Score:   Fall Risk Score:  `1  Depression screen PHQ 2/9  Depression screen [facility] 2/9 03/06/2021 01/25/2021 12/19/2020 10/24/2020 09/15/2020 07/19/2020 07/18/2020  Decreased Interest 0 0 1 0 0 1 1  Down, Depressed, Hopeless 0  0 1 0 0 1 1  PHQ - 2 Score 0 0 2 0 0 2 2  Altered sleeping - - - 0 - - -  Tired, decreased energy - - - - - - -  Change in appetite - - - - - - -  Feeling bad or failure about yourself  - - - - - - -  Trouble concentrating - - - - - - -  Moving slowly or fidgety/restless - - - - - - -  Suicidal thoughts - - - - - - -  PHQ-9 Score - - - 0 - - -  Difficult doing work/chores - - - - - - -  Some recent data might be hidden     Review of Systems  Constitutional: Negative.   HENT: Negative.    Eyes: Negative.   Respiratory: Negative.    Cardiovascular: Negative.   Gastrointestinal: Negative.   Endocrine: Negative.   Genitourinary: Negative.   Musculoskeletal: Negative.   Skin: Negative.   Allergic/Immunologic: Negative.   Neurological: Negative.   Hematological: Negative.   Psychiatric/Behavioral: Negative.        Objective:   Physical Exam Not performed as patient was seen via phone.     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  -Discussed benefits of exercise in reducing pain. -continue current regimen -increase gabapentin to 600mg  TID -repeat  lumbar MRI ordered given acutely worsening pain -Discussed following foods that may reduce pain: 1) Ginger (especially studied for arthritis)- reduce leukotriene production to decrease inflammation 2) Blueberries- high in phytonutrients that decrease inflammation 3) Salmon- marine omega-3s reduce joint swelling and pain 4) Pumpkin seeds- reduce inflammation 5) dark chocolate- reduces inflammation 6) turmeric- reduces inflammation 7) tart cherries - reduce pain and stiffness 8) extra virgin olive oil - its compound olecanthal helps to block prostaglandins  9) chili peppers- can be eaten or applied topically via capsaicin 10) mint- helpful for headache, muscle aches, joint pain, and itching 11) garlic- reduces inflammation  Link to further information on diet for chronic pain: [email or web address]   2) Chest congestion -continue nebulizer -continue steroids -continue amoxicillin.   9 minutes spent in discussion of her pain, increasing gabapentin dose, ordering lumbar MRI, plan for spinal cord stimulator next month, hopefully able to wean off pain medications afterward as tolerated

## 2021-04-01 ENCOUNTER — Other Ambulatory Visit: Payer: Self-pay | Admitting: Pulmonary Disease

## 2021-04-03 ENCOUNTER — Other Ambulatory Visit: Payer: Self-pay

## 2021-04-03 ENCOUNTER — Encounter
Payer: Medicare Other | Attending: Physical Medicine and Rehabilitation | Admitting: Physical Medicine and Rehabilitation

## 2021-04-03 DIAGNOSIS — M5416 Radiculopathy, lumbar region: Secondary | ICD-10-CM | POA: Insufficient documentation

## 2021-04-03 DIAGNOSIS — Z79891 Long term (current) use of opiate analgesic: Secondary | ICD-10-CM | POA: Insufficient documentation

## 2021-04-03 DIAGNOSIS — M48062 Spinal stenosis, lumbar region with neurogenic claudication: Secondary | ICD-10-CM | POA: Insufficient documentation

## 2021-04-03 DIAGNOSIS — M961 Postlaminectomy syndrome, not elsewhere classified: Secondary | ICD-10-CM | POA: Insufficient documentation

## 2021-04-03 DIAGNOSIS — Z5181 Encounter for therapeutic drug level monitoring: Secondary | ICD-10-CM | POA: Insufficient documentation

## 2021-04-03 DIAGNOSIS — G894 Chronic pain syndrome: Secondary | ICD-10-CM | POA: Insufficient documentation

## 2021-04-03 MED ORDER — OXYCODONE-ACETAMINOPHEN 5-325 MG PO TABS: 1.0000 | ORAL_TABLET | Freq: Four times a day (QID) | ORAL | 0 refills | Status: DC | PRN

## 2021-04-03 MED ORDER — DULOXETINE HCL 30 MG PO CPEP: 30.0000 mg | ORAL_CAPSULE | Freq: Every day | ORAL | 3 refills | Status: AC

## 2021-04-03 NOTE — Progress Notes (Signed)
Subjective:    Patient ID: Alisha Espinoza, female    DOB: 1965/09/30, 56 y.o.   MRN: [id number]  HPI An audio/video tele-health visit is felt to be the most appropriate encounter for this patient at this time. This is a follow up tele-visit via phone. The patient is at home. MD is at office.    Alisha Espinoza is a 56 year old woman who presents for follow-up of low back pain  1) Low back pain -continues to hurt.  -does not need any refills today -had good benefit from [facility] in the past -sometimes moves into the leg -she is taking Cymbalta every day 20mg , she is willing to increase dose -gabapentin does help, but increase to 600mg  TID did not help her significantly and is making her sleepy -pain has been severe recently, radiating into both legs -she is scheduled for spinal cord stimulator on January 17th.  -she had a procedure with Dr. Fredna Dow in December and received hydrocodone from him. She no longer finds hydrocodone effective for her and asks if there is a more effective medication we can give her.   2) muscle pain in legs -improved with either cymbalta and steroids- improved to 2  3) Bilateral hip pain -started yesterday  Pain Inventory Average Pain 8 Pain Right Now 9 My pain is intermittent, sharp, stabbing, tingling, and aching  In the last 24 hours, has pain interfered with the following? General activity 9 Relation with others 10 Enjoyment of life 10 What TIME of day is your pain at its worst? morning , daytime, evening, and night Sleep (in general) Poor  Pain is worse with: walking, bending, sitting, inactivity, standing, and some activites Pain improves with: rest, heat/ice, and medication Relief from Meds: 3  Family History  Problem Relation Age of Onset   Diabetes Mother    Hypertension Father    Pancreatic cancer Sister    Colon cancer Neg Hx    Rectal cancer Neg Hx    Stomach cancer Neg Hx    Colon polyps Neg Hx    Esophageal cancer Neg Hx    Social  History   Socioeconomic History   Marital status: Divorced    Spouse name: Not on file   Number of children: Not on file   Years of education: Not on file   Highest education level: Not on file  Occupational History   Not on file  Tobacco Use   Smoking status: Every Day    Packs/day: 0.50    Years: 30.00    Pack years: 15.00    Types: Cigarettes   Smokeless tobacco: Never   Tobacco comments:    10cigs/day  Vaping Use   Vaping Use: Never used  Substance and Sexual Activity   Alcohol use: Not Currently    Comment: occasionally   Drug use: No   Sexual activity: Yes    Birth control/protection: Surgical    Comment: Hysterectomy  Other Topics Concern   Not on file  Social History Narrative   Not on file   Social Determinants of Health   Financial Resource Strain: Not on file  Food Insecurity: Not on file  Transportation Needs: Not on file  Physical Activity: Not on file  Stress: Not on file  Social Connections: Not on file   Past Surgical History:  Procedure Laterality Date   ABDOMINAL HYSTERECTOMY  2000   ANTERIOR CERVICAL DECOMP/DISCECTOMY FUSION  03/19/2012   Procedure: ANTERIOR CERVICAL DECOMPRESSION/DISCECTOMY FUSION 1 LEVEL;  Surgeon: Duane Lope  Rolena Infante, MD;  Location: [place];  Service: Orthopedics;  Laterality: Left;  Total Disc Replacement C4-5   ANTERIOR CERVICAL DECOMP/DISCECTOMY FUSION N/A 05/07/2012   Procedure: ANTERIOR CERVICAL DECOMPRESSION/DISCECTOMY FUSION 1 LEVEL/HARDWARE REMOVAL;  Surgeon: Melina Schools, MD;  Location: [place];  Service: Orthopedics;  Laterality: N/A;  REMOVAL OF CERVICAL DISC REPLACEMENT AND ACDF C4-5   BACK SURGERY     CARPAL TUNNEL RELEASE Right 09/28/2018   Procedure: CARPAL TUNNEL RELEASE;  Surgeon: Eustace Moore, MD;  Location: [place];  Service: Neurosurgery;  Laterality: Right;  CARPAL TUNNEL RELEASE   CERVICAL FUSION  05/07/2012   Dr Rolena Infante   COLONOSCOPY     CYSTECTOMY     L wrist   POLYPECTOMY     ROTATOR CUFF REPAIR     Right    TRIGGER FINGER RELEASE Left 05/03/2019   Procedure: LEFT LONG AND RING FINGER RELEASE TRIGGER FINGER/A-1 PULLEY;  Surgeon: Leanora Cover, MD;  Location: [place];  Service: Orthopedics;  Laterality: Left;  beir block   TRIGGER FINGER RELEASE Right 09/23/2019   Procedure: RELEASE TRIGGER FINGER/A-1 PULLEY RIGHT THUMB, LONG AND RING FINGERS;  Surgeon: Leanora Cover, MD;  Location: [place];  Service: Orthopedics;  Laterality: Right;   TRIGGER FINGER RELEASE Right 03/19/2021   Procedure: RELEASE TRIGGER FINGER/A-1 PULLEY RIGHT SMALL FINGER;  Surgeon: Leanora Cover, MD;  Location: [place];  Service: Orthopedics;  Laterality: Right;  30 MIN   TYMPANOSTOMY TUBE PLACEMENT     Past Surgical History:  Procedure Laterality Date   ABDOMINAL HYSTERECTOMY  2000   ANTERIOR CERVICAL DECOMP/DISCECTOMY FUSION  03/19/2012   Procedure: ANTERIOR CERVICAL DECOMPRESSION/DISCECTOMY FUSION 1 LEVEL;  Surgeon: Melina Schools, MD;  Location: [place];  Service: Orthopedics;  Laterality: Left;  Total Disc Replacement C4-5   ANTERIOR CERVICAL DECOMP/DISCECTOMY FUSION N/A 05/07/2012   Procedure: ANTERIOR CERVICAL DECOMPRESSION/DISCECTOMY FUSION 1 LEVEL/HARDWARE REMOVAL;  Surgeon: Melina Schools, MD;  Location: [place];  Service: Orthopedics;  Laterality: N/A;  REMOVAL OF CERVICAL DISC REPLACEMENT AND ACDF C4-5   BACK SURGERY     CARPAL TUNNEL RELEASE Right 09/28/2018   Procedure: CARPAL TUNNEL RELEASE;  Surgeon: Eustace Moore, MD;  Location: [place];  Service: Neurosurgery;  Laterality: Right;  CARPAL TUNNEL RELEASE   CERVICAL FUSION  05/07/2012   Dr Rolena Infante   COLONOSCOPY     CYSTECTOMY     L wrist   POLYPECTOMY     ROTATOR CUFF REPAIR     Right   TRIGGER FINGER RELEASE Left 05/03/2019   Procedure: LEFT LONG AND RING FINGER RELEASE TRIGGER FINGER/A-1 PULLEY;  Surgeon: Leanora Cover, MD;  Location: [place];  Service: Orthopedics;  Laterality: Left;  beir block   TRIGGER  FINGER RELEASE Right 09/23/2019   Procedure: RELEASE TRIGGER FINGER/A-1 PULLEY RIGHT THUMB, LONG AND RING FINGERS;  Surgeon: Leanora Cover, MD;  Location: [place];  Service: Orthopedics;  Laterality: Right;   TRIGGER FINGER RELEASE Right 03/19/2021   Procedure: RELEASE TRIGGER FINGER/A-1 PULLEY RIGHT SMALL FINGER;  Surgeon: Leanora Cover, MD;  Location: [place];  Service: Orthopedics;  Laterality: Right;  30 MIN   TYMPANOSTOMY TUBE PLACEMENT     Past Medical History:  Diagnosis Date   Bronchitis    COPD (chronic obstructive pulmonary disease) ([place])    Diabetes mellitus ([place]) 01/19/2019   Dyspnea    Heart murmur    resolved "closed by age 7."    Hyperlipidemia  Hypertension    Seasonal allergies    There were no vitals taken for this visit.  Opioid Risk Score:   Fall Risk Score:  `1  Depression screen PHQ 2/9  Depression screen [facility] 2/9 03/06/2021 01/25/2021 12/19/2020 10/24/2020 09/15/2020 07/19/2020 07/18/2020  Decreased Interest 0 0 1 0 0 1 1  Down, Depressed, Hopeless 0 0 1 0 0 1 1  PHQ - 2 Score 0 0 2 0 0 2 2  Altered sleeping - - - 0 - - -  Tired, decreased energy - - - - - - -  Change in appetite - - - - - - -  Feeling bad or failure about yourself  - - - - - - -  Trouble concentrating - - - - - - -  Moving slowly or fidgety/restless - - - - - - -  Suicidal thoughts - - - - - - -  PHQ-9 Score - - - 0 - - -  Difficult doing work/chores - - - - - - -  Some recent data might be hidden     Review of Systems  Constitutional: Negative.   HENT: Negative.    Eyes: Negative.   Respiratory: Negative.    Cardiovascular: Negative.   Gastrointestinal: Negative.   Endocrine: Negative.   Genitourinary: Negative.   Musculoskeletal: Negative.   Skin: Negative.   Allergic/Immunologic: Negative.   Neurological: Negative.   Hematological: Negative.   Psychiatric/Behavioral: Negative.        Objective:   Physical Exam Not performed as patient  was seen via phone.     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  -Discussed current symptoms of pain and history of pain.  -Discussed benefits of exercise in reducing pain. -continue current regimen -increase gabapentin to 600mg  TID, will not increase further given waning efficacy and sleepiness -d/c hydrocodone since currently no longer effective.  -will trial percocet 5mg -325mg , discussed with patient that this is the strongest opioid medication we prescribe. Hopefully she will no longer need after spinal cord stimulator.  -our staff will let her know that she should inform us if she needs to receive opioid medication from another provider following a surgery and we will permit them to take over prescribing, but she cannot receive medication from multiple providers. 1st warning letter will be sent.  -repeat lumbar MRI ordered given acutely worsening pain -Discussed following foods that may reduce pain: 1) Ginger (especially studied for arthritis)- reduce leukotriene production to decrease inflammation 2) Blueberries- high in phytonutrients that decrease inflammation 3) Salmon- marine omega-3s reduce joint swelling and pain 4) Pumpkin seeds- reduce inflammation 5) dark chocolate- reduces inflammation 6) turmeric- reduces inflammation 7) tart cherries - reduce pain and stiffness 8) extra virgin olive oil - its compound olecanthal helps to block prostaglandins  9) chili peppers- can be eaten or applied topically via capsaicin 10) mint- helpful for headache, muscle aches, joint pain, and itching 11) garlic- reduces inflammation  Link to further information on diet for chronic pain: http://[email or web address].randall.com/   2) Chest congestion -continue nebulizer -continue  steroids -continue amoxicillin.    5 minutes spent in discussion of her pain, that Norco is no longer effective for her, that Gabapentin is causing her sleepiness, increasing her Cymblata dose, discontinuing her Norco, and discussing replacing with Percocet until spinal cord stimulator placement

## 2021-04-06 ENCOUNTER — Emergency Department (HOSPITAL_COMMUNITY)
Admission: EM | Admit: 2021-04-06 | Discharge: 2021-04-06 | Disposition: A | Discharge status: Home / Self Care | Payer: Medicare Other | Attending: Emergency Medicine | Admitting: Emergency Medicine

## 2021-04-06 ENCOUNTER — Emergency Department (HOSPITAL_COMMUNITY): Payer: Medicare Other

## 2021-04-06 ENCOUNTER — Other Ambulatory Visit: Payer: Self-pay

## 2021-04-06 ENCOUNTER — Encounter (HOSPITAL_COMMUNITY): Payer: Self-pay

## 2021-04-06 DIAGNOSIS — N9489 Other specified conditions associated with female genital organs and menstrual cycle: Secondary | ICD-10-CM | POA: Insufficient documentation

## 2021-04-06 DIAGNOSIS — T7840XA Allergy, unspecified, initial encounter: Secondary | ICD-10-CM

## 2021-04-06 DIAGNOSIS — R0602 Shortness of breath: Secondary | ICD-10-CM | POA: Diagnosis not present

## 2021-04-06 DIAGNOSIS — L500 Allergic urticaria: Secondary | ICD-10-CM | POA: Insufficient documentation

## 2021-04-06 DIAGNOSIS — Z7984 Long term (current) use of oral hypoglycemic drugs: Secondary | ICD-10-CM | POA: Diagnosis not present

## 2021-04-06 DIAGNOSIS — Z79899 Other long term (current) drug therapy: Secondary | ICD-10-CM | POA: Diagnosis not present

## 2021-04-06 LAB — BASIC METABOLIC PANEL
Anion gap: 5 (ref 5–15)
BUN: 15 mg/dL (ref 6–20)
CO2: 23 mmol/L (ref 22–32)
Calcium: 9.2 mg/dL (ref 8.9–10.3)
Chloride: 107 mmol/L (ref 98–111)
Creatinine, Ser: 0.72 mg/dL (ref 0.44–1.00)
GFR, Estimated: >60 mL/min (ref >60)
Glucose, Bld: 105 mg/dL — ABNORMAL HIGH (ref 70–99)
Potassium: 3.7 mmol/L (ref 3.5–5.1)
Sodium: 135 mmol/L (ref 135–145)

## 2021-04-06 LAB — CBC WITH DIFFERENTIAL/PLATELET
Abs Immature Granulocytes: 0.05 10*3/uL (ref 0.00–0.07)
Basophils Absolute: 0.1 10*3/uL (ref 0.0–0.1)
Basophils Relative: 1 %
Eosinophils Absolute: 0.2 10*3/uL (ref 0.0–0.5)
Eosinophils Relative: 2 %
HCT: 44.4 % (ref 36.0–46.0)
Hemoglobin: 14.4 g/dL (ref 12.0–15.0)
Immature Granulocytes: 1 %
Lymphocytes Relative: 29 %
Lymphs Abs: 3 10*3/uL (ref 0.7–4.0)
MCH: 26.6 pg (ref 26.0–34.0)
MCHC: 32.4 g/dL (ref 30.0–36.0)
MCV: 81.9 fL (ref 80.0–100.0)
Monocytes Absolute: 0.7 10*3/uL (ref 0.1–1.0)
Monocytes Relative: 7 %
Neutro Abs: 6.7 10*3/uL (ref 1.7–7.7)
Neutrophils Relative %: 60 %
Platelets: 260 10*3/uL (ref 150–400)
RBC: 5.42 MIL/uL — ABNORMAL HIGH (ref 3.87–5.11)
RDW: 17.6 % — ABNORMAL HIGH (ref 11.5–15.5)
WBC: 10.7 10*3/uL — ABNORMAL HIGH (ref 4.0–10.5)
nRBC: 0 % (ref 0.0–0.2)

## 2021-04-06 LAB — I-STAT BETA HCG BLOOD, ED (MC, WL, AP ONLY): I-stat hCG, quantitative: <5 m[IU]/mL (ref <5)

## 2021-04-06 LAB — TROPONIN I (HIGH SENSITIVITY)
Troponin I (High Sensitivity): 3 ng/L (ref <18)
Troponin I (High Sensitivity): 2 ng/L (ref <18)

## 2021-04-06 LAB — BRAIN NATRIURETIC PEPTIDE: B Natriuretic Peptide: 12.1 pg/mL (ref 0.0–100.0)

## 2021-04-06 MED ORDER — IPRATROPIUM-ALBUTEROL 0.5-2.5 (3) MG/3ML IN SOLN
3.0000 mL | Freq: Once | RESPIRATORY_TRACT | Status: AC
  Administered 2021-04-06: 3 mL via RESPIRATORY_TRACT
  Filled 2021-04-06: qty 3

## 2021-04-06 MED ORDER — PREDNISONE 10 MG PO TABS: 50.0000 mg | ORAL_TABLET | Freq: Every day | ORAL | 0 refills | Status: DC

## 2021-04-06 MED ORDER — EPINEPHRINE 0.3 MG/0.3ML IJ SOAJ
0.3000 mg | Freq: Once | INTRAMUSCULAR | Status: AC
  Administered 2021-04-06: 0.3 mg via INTRAMUSCULAR
  Filled 2021-04-06: qty 0.3

## 2021-04-06 MED ORDER — DIPHENHYDRAMINE HCL 50 MG/ML IJ SOLN
25.0000 mg | Freq: Once | INTRAMUSCULAR | Status: AC
  Administered 2021-04-06: 25 mg via INTRAVENOUS
  Filled 2021-04-06: qty 1

## 2021-04-06 MED ORDER — METHYLPREDNISOLONE SODIUM SUCC 125 MG IJ SOLR
125.0000 mg | Freq: Once | INTRAMUSCULAR | Status: AC
  Administered 2021-04-06: 125 mg via INTRAVENOUS
  Filled 2021-04-06: qty 2

## 2021-04-06 MED ORDER — DIPHENHYDRAMINE HCL 25 MG PO TABS
25.0000 mg | ORAL_TABLET | Freq: Four times a day (QID) | ORAL | 0 refills | Status: AC | PRN
Start: 2021-04-06 — End: ?

## 2021-04-06 MED ORDER — FAMOTIDINE IN NACL 20-0.9 MG/50ML-% IV SOLN
20.0000 mg | Freq: Once | INTRAVENOUS | Status: AC
  Administered 2021-04-06: 20 mg via INTRAVENOUS
  Filled 2021-04-06: qty 50

## 2021-04-06 NOTE — ED Provider Triage Note (Signed)
Emergency Medicine Provider Triage Evaluation Note  EVANGELYN Espinoza , a 56 y.o. female  was evaluated in triage.  Pt complains of   Alisha Espinoza is a 56 y.o. female reports history of diabetes, hypertension, hyperlipidemia presented today for concern of allergic reaction.   Patient reports that last night she had some tomato sauce, toast he does and several other foods, she denies any food allergy.  She reports she ate this food around 11 PM, at around midnight she developed itching and hives diffusely of her body.  She reports itching is moderate intensity, constant, not improved with 1 Benadryl she took at home around midnight.  She denies similar in the past.  Patient reports shortly after her itching began she developed a throat closing sensation and mild shortness of breath.   Patient denies any known food allergies.  She denies any known drug allergies or new drugs.  She denies use of ACE inhibitor.  Review of Systems  Positive: Hives, throat tingling, shortness of breath Negative: Nausea, vomiting, chest pain, fever, chills or any additional concerns  Physical Exam  BP (!) 159/106 (BP Location: Left Arm)    Pulse 99    Temp 98.1 F (36.7 C) (Oral)    Resp 20    Ht 5\' 6"  (1.676 m)    Wt 88.5 kg    SpO2 97%    BMI 31.47 kg/m  Gen:   Awake, no distress   Resp:  Normal effort  MSK:   Moves extremities without difficulty  Other:  Airway clear.  Lungs clear.  Diffuse faint hives upper extremities.  Medical Decision Making  Medically screening exam initiated at 7:09 AM.  Appropriate orders placed.  Alisha Espinoza was informed that the remainder of the evaluation will be completed by another provider, this initial triage assessment does not replace that evaluation, and the importance of remaining in the ED until their evaluation is complete.  Solu-Medrol, Benadryl, famotidine ordered.      Note: Portions of this report may have been transcribed using voice recognition software. Every  effort was made to ensure accuracy; however, inadvertent computerized transcription errors may still be present.    Alisha Espinoza, [place] 04/06/21 [phone]

## 2021-04-06 NOTE — Discharge Instructions (Signed)
We saw you in the ER after you had the allergic reaction.  The reaction is severe, however, it appears to be in control and there is no increased swelling or any difficulty in breathing noted. We are not sure what caused the reaction, and it is important for you to follow up with an allergist. Please take the medications prescribed. PLEASE RETURN TO THE ER IMMEDIATELY IN CASE YOU START HAVING WORSENING SWELLING, DIFFICULTY IN BREATHING ETC.  

## 2021-04-06 NOTE — ED Triage Notes (Addendum)
Pt reports allergic reaction beginning around midnight after eating some chips and dip. Hives and raised areas across body. Pt describes a throat closing sensation and some shortness of breath.

## 2021-04-06 NOTE — ED Provider Notes (Signed)
[name] DEPT Provider Note   CSN: [id number] Arrival date & time: 04/06/21  6440     History  Chief Complaint  Patient presents with   Allergic Reaction    Alisha Espinoza is a 56 y.o. female.  HPI     56 year old female comes in with chief complaint of allergic reaction.  Patient reports that she had late night food and when she got home she started having itching and hives.  She took some Benadryl and Rolaids.  In the morning she was still having the symptoms and decided to come to the ER.  While in the ER she started having some shortness of breath and feeling like her throat was closing.  She also has midsternal chest discomfort , nonradiating . she does not have any underlying heart disease or lung disease.  No history of allergic reaction like this before.  Home Medications Prior to Admission medications   Medication Sig Start Date End Date Taking? Authorizing Provider  albuterol (VENTOLIN HFA) 108 (90 Base) MCG/ACT inhaler Inhale 2 puffs into the lungs every 4 (four) hours as needed for wheezing or shortness of breath (or coughing). 02/14/21  Yes Olalere, Adewale A, MD  buPROPion (WELLBUTRIN XL) 150 MG 24 hr tablet Take 150 mg by mouth daily.   Yes [provider]  diphenhydrAMINE (BENADRYL) 25 MG tablet Take 1 tablet (25 mg total) by mouth every 6 (six) hours as needed. 04/06/21  Yes Alisha Biles, MD  DULoxetine (CYMBALTA) 30 MG capsule Take 1 capsule (30 mg total) by mouth daily. 04/03/21  Yes Raulkar, Clide Deutscher, MD  gabapentin (NEURONTIN) 600 MG tablet Take 1 tablet (600 mg total) by mouth 3 (three) times daily. 03/30/21  Yes Raulkar, Clide Deutscher, MD  ibuprofen (ADVIL) 800 MG tablet TAKE 1 TABLET(800 MG) BY MOUTH EVERY 8 HOURS AS NEEDED FOR MODERATE PAIN Patient taking differently: Take 800 mg by mouth every 8 (eight) hours as needed for moderate pain. 12/11/20  Yes Raulkar, Clide Deutscher, MD  LINZESS 290 MCG CAPS capsule TAKE 1 CAPSULE(290  MCG) BY MOUTH DAILY BEFORE BREAKFAST Patient taking differently: Take 290 mcg by mouth daily before breakfast. 07/28/19  Yes Nandigam, Venia Minks, MD  metFORMIN (GLUCOPHAGE) 500 MG tablet Take 500 mg by mouth 2 (two) times daily. 04/10/20  Yes [provider]  Multiple Vitamin (MULTIVITAMIN) capsule Take 1 capsule by mouth daily.   Yes [provider]  oxyCODONE-acetaminophen (PERCOCET) 5-325 MG tablet Take 1 tablet by mouth every 6 (six) hours as needed for severe pain. 04/03/21  Yes Raulkar, Clide Deutscher, MD  predniSONE (DELTASONE) 10 MG tablet Take 5 tablets (50 mg total) by mouth daily. 04/07/21  Yes Talyssa Gibas, MD  simvastatin (ZOCOR) 20 MG tablet Take 20 mg by mouth daily. 01/26/18  Yes [provider]  TRELEGY ELLIPTA 100-62.5-25 MCG/ACT AEPB INHALE 1 PUFF INTO THE LUNGS DAILY Patient taking differently: Inhale 1 puff into the lungs daily. 04/03/21  Yes Olalere, Adewale A, MD  tretinoin (RETIN-A) 0.1 % cream Apply 1 application topically at bedtime.  02/17/19  Yes [provider]  valsartan-hydrochlorothiazide (DIOVAN-HCT) 160-12.5 MG tablet Take 1 tablet by mouth daily. 02/08/19  Yes [provider]  Blood Glucose Monitoring Suppl (CONTOUR NEXT MONITOR) w/Device KIT 1 each by Does not apply route daily. Use to test blood sugars 1-2 times daily. 02/02/19   Libby Maw, MD  glucose blood (CONTOUR NEXT TEST) test strip Use to test 1-2 times daily. 02/02/19  Libby Maw, MD  Microlet Lancets MISC USE TO TEST BLOOD SUGARS ONCE TO BID 02/02/19   [provider]      Allergies    Mobic [meloxicam] and Meloxicam    Review of Systems   Review of Systems  Constitutional:  Positive for activity change.  Respiratory:  Positive for shortness of breath.   Cardiovascular:  Positive for chest pain.  Allergic/Immunologic: Negative for environmental allergies, food allergies and immunocompromised state.   Physical Exam Updated Vital  Signs BP (!) 143/82    Pulse 96    Temp 98.1 F (36.7 C) (Oral)    Resp (!) 21    Ht '5\' 6"'  (1.676 m)    Wt 88.5 kg    SpO2 90%    BMI 31.47 kg/m  Physical Exam Vitals and nursing note reviewed.  Constitutional:      Appearance: She is well-developed.  HENT:     Head: Atraumatic.     Mouth/Throat:     Comments: No oral swelling appreciated Cardiovascular:     Rate and Rhythm: Normal rate.  Pulmonary:     Effort: Pulmonary effort is normal.     Breath sounds: No stridor. Wheezing present.     Comments: Mild and expiratory wheezing Musculoskeletal:        General: No swelling or tenderness.     Cervical back: Normal range of motion and neck supple.     Right lower leg: No edema.     Left lower leg: No edema.  Skin:    General: Skin is warm and dry.     Findings: Rash present.     Comments: Patient has few diffuse urticarial rash scattered over her upper extremity and abdominal wall  Neurological:     Mental Status: She is alert and oriented to person, place, and time.    ED Results / Procedures / Treatments   Labs (all labs ordered are listed, but only abnormal results are displayed) Labs Reviewed  BASIC METABOLIC PANEL - Abnormal; Notable for the following components:      Result Value   Glucose, Bld 105 (*)    All other components within normal limits  CBC WITH DIFFERENTIAL/PLATELET - Abnormal; Notable for the following components:   WBC 10.7 (*)    RBC 5.42 (*)    RDW 17.6 (*)    All other components within normal limits  BRAIN NATRIURETIC PEPTIDE  I-STAT BETA HCG BLOOD, ED ([facility], [facility], [facility] ONLY)  TROPONIN I (HIGH SENSITIVITY)  TROPONIN I (HIGH SENSITIVITY)    EKG EKG Interpretation  Date/Time:  Friday April 06 2021 07:00:22 EST Ventricular Rate:  96 PR Interval:  132 QRS Duration: 86 QT Interval:  354 QTC Calculation: 448 R Axis:   77 Text Interpretation: Sinus rhythm Low voltage, precordial leads Anteroseptal infarct, old No acute changes Confirmed by  Alisha Espinoza [phone]) on 04/06/2021 8:44:52 AM  Radiology DG Chest Port 1 View  Result Date: 04/06/2021 CLINICAL DATA:  Mild acute allergic reaction with hives, shortness of breath, throat swelling EXAM: PORTABLE CHEST 1 VIEW COMPARISON:  03/03/2021 FINDINGS: The heart size and mediastinal contours are within normal limits. Both lungs are clear. The visualized skeletal structures are unremarkable. IMPRESSION: No active disease. Electronically Signed   By: Jerilynn Mages.  Shick M.D.   On: 04/06/2021 08:40    Procedures Procedures    Medications Ordered in ED Medications  diphenhydrAMINE (BENADRYL) injection 25 mg (25 mg Intravenous Given 04/06/21 0731)  methylPREDNISolone sodium succinate (SOLU-MEDROL) 125  mg/2 mL injection 125 mg (125 mg Intravenous Given 04/06/21 0730)  famotidine (PEPCID) IVPB 20 mg premix (0 mg Intravenous Stopped 04/06/21 0800)  ipratropium-albuterol (DUONEB) 0.5-2.5 (3) MG/3ML nebulizer solution 3 mL (3 mLs Nebulization Given 04/06/21 0809)  EPINEPHrine (EPI-PEN) injection 0.3 mg (0.3 mg Intramuscular Given 04/06/21 0853)    ED Course/ Medical Decision Making/ A&P Clinical Course as of 04/06/21 1055  Fri Apr 06, 2021  1054 Patient's heart enzymes, BNP are normal.  Chest x-ray is reassuring.  CBC and BMP are also fine.  Patient has not had any deterioration in her status.  Results discussed with her.  Stable for discharge with return precautions. [AN]  1055 SpO2: 90 % Rebounds to 98% as soon as she is awake and talking [AN]    Clinical Course User Index [AN] Alisha Biles, MD                           Medical Decision Making  56 year old comes in with chief complaint of rash.  She is also complaining of some shortness of breath and chest pain.  Differential diagnosis includes allergic reaction, anaphylaxis, angioedema, pulmonary edema, CHF, atelectasis, ACS, pneumonia, pneumothorax, GERD.  She has no oral swelling but reports that her throat feels like it is closing.  She is  also having some end expiratory wheezing.  She does not have any primary cardiac or pulmonary disease history.  No history of PE, DVT and no risk factors was the same.  No signs of DVT on exam.  No JVD, no peripheral edema.  Suspect that most likely she is having an allergic reaction.  We will give her epi, given her subjective symptoms of closing throat.  Expanding work-up to ensure that we are not missing atypical presentation of ACS, getting troponins for that.  Also getting BNP to rule out CHF.  Chest x-ray was ordered and it does not show any evidence of pneumonia.         Final Clinical Impression(s) / ED Diagnoses Final diagnoses:  Allergic reaction, initial encounter    Rx / DC Orders ED Discharge Orders          Ordered    predniSONE (DELTASONE) 10 MG tablet  Daily        04/06/21 1054    diphenhydrAMINE (BENADRYL) 25 MG tablet  Every 6 hours PRN        04/06/21 1054              Alisha Biles, MD 04/06/21 1055

## 2021-04-06 NOTE — ED Notes (Signed)
Pt ambulated to the bathroom w/o assitance and with steady gait

## 2021-04-08 ENCOUNTER — Other Ambulatory Visit: Payer: Self-pay

## 2021-04-08 ENCOUNTER — Encounter (HOSPITAL_COMMUNITY): Payer: Self-pay

## 2021-04-08 ENCOUNTER — Emergency Department (HOSPITAL_COMMUNITY)
Admission: EM | Admit: 2021-04-08 | Discharge: 2021-04-08 | Disposition: A | Discharge status: Home / Self Care | Payer: Medicare Other | Attending: Emergency Medicine | Admitting: Emergency Medicine

## 2021-04-08 DIAGNOSIS — L299 Pruritus, unspecified: Secondary | ICD-10-CM | POA: Diagnosis present

## 2021-04-08 DIAGNOSIS — Z79899 Other long term (current) drug therapy: Secondary | ICD-10-CM | POA: Diagnosis not present

## 2021-04-08 DIAGNOSIS — L509 Urticaria, unspecified: Secondary | ICD-10-CM | POA: Insufficient documentation

## 2021-04-08 DIAGNOSIS — T7840XA Allergy, unspecified, initial encounter: Secondary | ICD-10-CM | POA: Diagnosis not present

## 2021-04-08 DIAGNOSIS — Z7984 Long term (current) use of oral hypoglycemic drugs: Secondary | ICD-10-CM | POA: Insufficient documentation

## 2021-04-08 LAB — CBG MONITORING, ED: Glucose-Capillary: 145 mg/dL — ABNORMAL HIGH (ref 70–99)

## 2021-04-08 MED ORDER — HYDROXYZINE HCL 25 MG PO TABS: 25.0000 mg | ORAL_TABLET | Freq: Four times a day (QID) | ORAL | 0 refills | Status: AC | PRN

## 2021-04-08 MED ORDER — HYDROXYZINE HCL 25 MG PO TABS
25.0000 mg | ORAL_TABLET | Freq: Once | ORAL | Status: AC
Start: 2021-04-08 — End: 2021-04-08
  Administered 2021-04-08: 25 mg via ORAL
  Filled 2021-04-08: qty 1

## 2021-04-08 NOTE — ED Provider Notes (Signed)
[facility] DEPT Provider Note   CSN: [id number] Arrival date & time: 04/08/21  0153     History  Chief Complaint  Patient presents with   Allergic Reaction    Alisha Espinoza is a 56 y.o. female.  The history is provided by the patient and medical records.  Allergic Reaction Alisha Espinoza is a 56 y.o. female who presents to the Emergency Department complaining of possible allergic reaction.  She presents emergency department due to concern for allergic reaction.  She started taking duloxetine on Thursday.  She was evaluated in the emergency department on Friday for swelling in her throat and diffuse hives.  She was treated with prednisone, epinephrine with significant improvement in her symptoms.  Since leaving the emergency department her hives have resolved but she has been experiencing ongoing itching.  She took 3 separate doses of 1 tablet of Benadryl since ED discharge and complains of ongoing itching.  Her hives have resolved.  Her mouth feels dry.  She has not checked her blood sugar.  She has not taken duloxetine since Thursday night.    Home Medications Prior to Admission medications   Medication Sig Start Date End Date Taking? Authorizing Provider  hydrOXYzine (ATARAX) 25 MG tablet Take 1 tablet (25 mg total) by mouth every 6 (six) hours as needed for itching. 04/08/21  Yes Quintella Reichert, MD  albuterol (VENTOLIN HFA) 108 (90 Base) MCG/ACT inhaler Inhale 2 puffs into the lungs every 4 (four) hours as needed for wheezing or shortness of breath (or coughing). 02/14/21   Laurin Coder, MD  Blood Glucose Monitoring Suppl (CONTOUR NEXT MONITOR) w/Device KIT 1 each by Does not apply route daily. Use to test blood sugars 1-2 times daily. 02/02/19   Libby Maw, MD  buPROPion (WELLBUTRIN XL) 150 MG 24 hr tablet Take 150 mg by mouth daily.    [provider]  diphenhydrAMINE (BENADRYL) 25 MG tablet Take 1 tablet (25 mg total) by  mouth every 6 (six) hours as needed. 04/06/21   Varney Biles, MD  DULoxetine (CYMBALTA) 30 MG capsule Take 1 capsule (30 mg total) by mouth daily. 04/03/21   Raulkar, Clide Deutscher, MD  gabapentin (NEURONTIN) 600 MG tablet Take 1 tablet (600 mg total) by mouth 3 (three) times daily. 03/30/21   Raulkar, Clide Deutscher, MD  glucose blood (CONTOUR NEXT TEST) test strip Use to test 1-2 times daily. 02/02/19   Libby Maw, MD  ibuprofen (ADVIL) 800 MG tablet TAKE 1 TABLET(800 MG) BY MOUTH EVERY 8 HOURS AS NEEDED FOR MODERATE PAIN Patient taking differently: Take 800 mg by mouth every 8 (eight) hours as needed for moderate pain. 12/11/20   Raulkar, Clide Deutscher, MD  LINZESS 290 MCG CAPS capsule TAKE 1 CAPSULE(290 MCG) BY MOUTH DAILY BEFORE BREAKFAST Patient taking differently: Take 290 mcg by mouth daily before breakfast. 07/28/19   Mauri Pole, MD  metFORMIN (GLUCOPHAGE) 500 MG tablet Take 500 mg by mouth 2 (two) times daily. 04/10/20   [provider]  Microlet Lancets MISC USE TO TEST BLOOD SUGARS ONCE TO BID 02/02/19   [provider]  Multiple Vitamin (MULTIVITAMIN) capsule Take 1 capsule by mouth daily.    [provider]  oxyCODONE-acetaminophen (PERCOCET) 5-325 MG tablet Take 1 tablet by mouth every 6 (six) hours as needed for severe pain. 04/03/21   Raulkar, Clide Deutscher, MD  simvastatin (ZOCOR) 20 MG tablet Take 20 mg by mouth daily. 01/26/18   [provider]  TRELEGY ELLIPTA 100-62.5-25 MCG/ACT AEPB INHALE 1 PUFF INTO THE LUNGS DAILY Patient taking differently: Inhale 1 puff into the lungs daily. 04/03/21   Olalere, Cicero Duck A, MD  tretinoin (RETIN-A) 0.1 % cream Apply 1 application topically at bedtime.  02/17/19   [provider]  valsartan-hydrochlorothiazide (DIOVAN-HCT) 160-12.5 MG tablet Take 1 tablet by mouth daily. 02/08/19   [provider]      Allergies    Mobic [meloxicam] and Meloxicam    Review of Systems   Review of Systems   All other systems reviewed and are negative.  Physical Exam Updated Vital Signs BP 122/85    Pulse 86    Temp 97.6 F (36.4 C) (Oral)    Resp 16    Ht '5\' 6"'  (1.676 m)    Wt 88 kg    SpO2 91%    BMI 31.31 kg/m  Physical Exam Vitals and nursing note reviewed.  Constitutional:      Appearance: She is well-developed.  HENT:     Head: Normocephalic and atraumatic.     Mouth/Throat:     Mouth: Mucous membranes are moist.     Pharynx: No oropharyngeal exudate or posterior oropharyngeal erythema.  Cardiovascular:     Rate and Rhythm: Normal rate and regular rhythm.     Heart sounds: No murmur heard. Pulmonary:     Effort: Pulmonary effort is normal. No respiratory distress.     Breath sounds: Normal breath sounds. No stridor.  Abdominal:     Palpations: Abdomen is soft.     Tenderness: There is no abdominal tenderness. There is no guarding or rebound.  Musculoskeletal:        General: No tenderness.  Skin:    General: Skin is warm and dry.     Comments: Occasional faint erythematous patches without urticaria  Neurological:     Mental Status: She is alert and oriented to person, place, and time.  Psychiatric:        Behavior: Behavior normal.    ED Results / Procedures / Treatments   Labs (all labs ordered are listed, but only abnormal results are displayed) Labs Reviewed  CBG MONITORING, ED - Abnormal; Notable for the following components:      Result Value   Glucose-Capillary 145 (*)    All other components within normal limits    EKG None  Radiology DG Chest Port 1 View  Result Date: 04/06/2021 CLINICAL DATA:  Mild acute allergic reaction with hives, shortness of breath, throat swelling EXAM: PORTABLE CHEST 1 VIEW COMPARISON:  03/03/2021 FINDINGS: The heart size and mediastinal contours are within normal limits. Both lungs are clear. The visualized skeletal structures are unremarkable. IMPRESSION: No active disease. Electronically Signed   By: Jerilynn Mages.  Shick M.D.   On:  04/06/2021 08:40    Procedures Procedures    Medications Ordered in ED Medications  hydrOXYzine (ATARAX) tablet 25 mg (25 mg Oral Given 04/08/21 7564)    ED Course/ Medical Decision Making/ A&P                           Medical Decision Making  Patient here for evaluation of itching to her body.  She was evaluated in the emergency department yesterday for allergic reaction and treated with epinephrine, prednisone at that time.  She has taken Benadryl, 3 doses in the last 24 hours but has persistent itching and dry mouth.  On examination she does have a few  erythematous regions without urticaria.  No evidence of anaphylaxis at this point.  She was treated with Atarax in the emergency department for her itching.  CBGs only minimally elevated.  Discussed with patient home care for itching following a recent allergic reaction.  Recommend continuing to discontinue her duloxetine.  Discussed home care for itching.  Discussed that she may take the Atarax OR the Benadryl for itching. Return precautions discussed.         Final Clinical Impression(s) / ED Diagnoses Final diagnoses:  Itching    Rx / DC Orders ED Discharge Orders          Ordered    hydrOXYzine (ATARAX) 25 MG tablet  Every 6 hours PRN        04/08/21 0343              Quintella Reichert, MD 04/08/21 [phone]

## 2021-04-08 NOTE — ED Triage Notes (Signed)
Pt reports with itching and dry throat after taking Cymbalta last night. Pt states that she was recently seen for the same thing.

## 2021-04-09 ENCOUNTER — Ambulatory Visit
Admission: RE | Admit: 2021-04-09 | Discharge: 2021-04-09 | Disposition: A | Discharge status: Home / Self Care | Payer: Commercial Managed Care - HMO | Source: Ambulatory Visit | Attending: Physical Medicine and Rehabilitation | Admitting: Physical Medicine and Rehabilitation

## 2021-04-09 DIAGNOSIS — M961 Postlaminectomy syndrome, not elsewhere classified: Secondary | ICD-10-CM

## 2021-04-11 ENCOUNTER — Ambulatory Visit: Payer: Medicare Other | Admitting: Physical Medicine and Rehabilitation

## 2021-04-12 ENCOUNTER — Other Ambulatory Visit: Payer: Self-pay

## 2021-04-12 ENCOUNTER — Encounter (HOSPITAL_BASED_OUTPATIENT_CLINIC_OR_DEPARTMENT_OTHER): Payer: Medicare Other | Admitting: Physical Medicine and Rehabilitation

## 2021-04-12 DIAGNOSIS — M961 Postlaminectomy syndrome, not elsewhere classified: Secondary | ICD-10-CM | POA: Diagnosis not present

## 2021-04-12 MED ORDER — OXYCODONE-ACETAMINOPHEN 5-325 MG PO TABS: 1.0000 | ORAL_TABLET | Freq: Four times a day (QID) | ORAL | 0 refills | Status: DC | PRN

## 2021-04-12 NOTE — Progress Notes (Signed)
Subjective:    Patient ID: Alisha Espinoza, female    DOB: 08-Mar-1966, 56 y.o.   MRN: [id number]  HPI An audio/video tele-health visit is felt to be the most appropriate encounter for this patient at this time. This is a follow up tele-visit via phone. The patient is at home. MD is at office. Prior to scheduling this appointment, our staff discussed the limitations of evaluation and management by telemedicine and the availability of in-person appointments. The patient expressed understanding and agreed to proceed.   Alisha Espinoza is a 56 year old woman who presents for follow-up of low back pain and right leg pain  1) Low back pain -continues to hurt severely -she asks about results of her MRI -she had an allergic response to Cymbalta and had to go to the ED -she had 8 months relief from laminectomy -she needs refill off her Percocet.  -had good benefit from [facility] in the past -sometimes moves into the leg -gabapentin does help, but increase to 600mg  TID did not help her significantly and is making her sleepy -pain has been severe recently, radiating into both legs -she is scheduled for spinal cord stimulator on January 17th.  -she had a procedure with Dr. Fredna Dow in December and received hydrocodone from him. She no longer finds hydrocodone effective for her and asks if there is a more effective medication we can give her.   2) muscle pain in legs -improved with either cymbalta and steroids- improved to 2  3) Bilateral hip pain -started yesterday  Pain Inventory Average Pain 8 Pain Right Now 9 My pain is intermittent, sharp, stabbing, tingling, and aching  In the last 24 hours, has pain interfered with the following? General activity 9 Relation with others 10 Enjoyment of life 10 What TIME of day is your pain at its worst? morning , daytime, evening, and night Sleep (in general) Poor  Pain is worse with: walking, bending, sitting, inactivity, standing, and some activites Pain  improves with: rest, heat/ice, and medication Relief from Meds: 3  Family History  Problem Relation Age of Onset   Diabetes Mother    Hypertension Father    Pancreatic cancer Sister    Colon cancer Neg Hx    Rectal cancer Neg Hx    Stomach cancer Neg Hx    Colon polyps Neg Hx    Esophageal cancer Neg Hx    Social History   Socioeconomic History   Marital status: Divorced    Spouse name: Not on file   Number of children: Not on file   Years of education: Not on file   Highest education level: Not on file  Occupational History   Not on file  Tobacco Use   Smoking status: Every Day    Packs/day: 0.50    Years: 30.00    Pack years: 15.00    Types: Cigarettes   Smokeless tobacco: Never   Tobacco comments:    10cigs/day  Vaping Use   Vaping Use: Never used  Substance and Sexual Activity   Alcohol use: Not Currently    Comment: occasionally   Drug use: No   Sexual activity: Yes    Birth control/protection: Surgical    Comment: Hysterectomy  Other Topics Concern   Not on file  Social History Narrative   Not on file   Social Determinants of Health   Financial Resource Strain: Not on file  Food Insecurity: Not on file  Transportation Needs: Not on file  Physical Activity:  Not on file  Stress: Not on file  Social Connections: Not on file   Past Surgical History:  Procedure Laterality Date   ABDOMINAL HYSTERECTOMY  2000   ANTERIOR CERVICAL DECOMP/DISCECTOMY FUSION  03/19/2012   Procedure: ANTERIOR CERVICAL DECOMPRESSION/DISCECTOMY FUSION 1 LEVEL;  Surgeon: Melina Schools, MD;  Location: [facility];  Service: Orthopedics;  Laterality: Left;  Total Disc Replacement C4-5   ANTERIOR CERVICAL DECOMP/DISCECTOMY FUSION N/A 05/07/2012   Procedure: ANTERIOR CERVICAL DECOMPRESSION/DISCECTOMY FUSION 1 LEVEL/HARDWARE REMOVAL;  Surgeon: Melina Schools, MD;  Location: [facility];  Service: Orthopedics;  Laterality: N/A;  REMOVAL OF CERVICAL DISC REPLACEMENT AND ACDF C4-5   BACK SURGERY      CARPAL TUNNEL RELEASE Right 09/28/2018   Procedure: CARPAL TUNNEL RELEASE;  Surgeon: Eustace Moore, MD;  Location: [facility];  Service: Neurosurgery;  Laterality: Right;  CARPAL TUNNEL RELEASE   CERVICAL FUSION  05/07/2012   Dr Rolena Infante   COLONOSCOPY     CYSTECTOMY     L wrist   POLYPECTOMY     ROTATOR CUFF REPAIR     Right   TRIGGER FINGER RELEASE Left 05/03/2019   Procedure: LEFT LONG AND RING FINGER RELEASE TRIGGER FINGER/A-1 PULLEY;  Surgeon: Leanora Cover, MD;  Location: [facility];  Service: Orthopedics;  Laterality: Left;  beir block   TRIGGER FINGER RELEASE Right 09/23/2019   Procedure: RELEASE TRIGGER FINGER/A-1 PULLEY RIGHT THUMB, LONG AND RING FINGERS;  Surgeon: Leanora Cover, MD;  Location: [facility];  Service: Orthopedics;  Laterality: Right;   TRIGGER FINGER RELEASE Right 03/19/2021   Procedure: RELEASE TRIGGER FINGER/A-1 PULLEY RIGHT SMALL FINGER;  Surgeon: Leanora Cover, MD;  Location: [facility];  Service: Orthopedics;  Laterality: Right;  30 MIN   TYMPANOSTOMY TUBE PLACEMENT     Past Surgical History:  Procedure Laterality Date   ABDOMINAL HYSTERECTOMY  2000   ANTERIOR CERVICAL DECOMP/DISCECTOMY FUSION  03/19/2012   Procedure: ANTERIOR CERVICAL DECOMPRESSION/DISCECTOMY FUSION 1 LEVEL;  Surgeon: Melina Schools, MD;  Location: [facility];  Service: Orthopedics;  Laterality: Left;  Total Disc Replacement C4-5   ANTERIOR CERVICAL DECOMP/DISCECTOMY FUSION N/A 05/07/2012   Procedure: ANTERIOR CERVICAL DECOMPRESSION/DISCECTOMY FUSION 1 LEVEL/HARDWARE REMOVAL;  Surgeon: Melina Schools, MD;  Location: [facility];  Service: Orthopedics;  Laterality: N/A;  REMOVAL OF CERVICAL DISC REPLACEMENT AND ACDF C4-5   BACK SURGERY     CARPAL TUNNEL RELEASE Right 09/28/2018   Procedure: CARPAL TUNNEL RELEASE;  Surgeon: Eustace Moore, MD;  Location: [facility];  Service: Neurosurgery;  Laterality: Right;  CARPAL TUNNEL RELEASE   CERVICAL FUSION  05/07/2012   Dr Rolena Infante    COLONOSCOPY     CYSTECTOMY     L wrist   POLYPECTOMY     ROTATOR CUFF REPAIR     Right   TRIGGER FINGER RELEASE Left 05/03/2019   Procedure: LEFT LONG AND RING FINGER RELEASE TRIGGER FINGER/A-1 PULLEY;  Surgeon: Leanora Cover, MD;  Location: [facility];  Service: Orthopedics;  Laterality: Left;  beir block   TRIGGER FINGER RELEASE Right 09/23/2019   Procedure: RELEASE TRIGGER FINGER/A-1 PULLEY RIGHT THUMB, LONG AND RING FINGERS;  Surgeon: Leanora Cover, MD;  Location: [facility];  Service: Orthopedics;  Laterality: Right;   TRIGGER FINGER RELEASE Right 03/19/2021   Procedure: RELEASE TRIGGER FINGER/A-1 PULLEY RIGHT SMALL FINGER;  Surgeon: Leanora Cover, MD;  Location: [facility];  Service: Orthopedics;  Laterality: Right;  30 MIN   TYMPANOSTOMY TUBE PLACEMENT  Past Medical History:  Diagnosis Date   Bronchitis    COPD (chronic obstructive pulmonary disease) ([facility])    Diabetes mellitus ([place]) 01/19/2019   Dyspnea    Heart murmur    resolved "closed by age 9."    Hyperlipidemia    Hypertension    Seasonal allergies    There were no vitals taken for this visit.  Opioid Risk Score:   Fall Risk Score:  `1  Depression screen PHQ 2/9  Depression screen [facility] 2/9 03/06/2021 01/25/2021 12/19/2020 10/24/2020 09/15/2020 07/19/2020 07/18/2020  Decreased Interest 0 0 1 0 0 1 1  Down, Depressed, Hopeless 0 0 1 0 0 1 1  PHQ - 2 Score 0 0 2 0 0 2 2  Altered sleeping - - - 0 - - -  Tired, decreased energy - - - - - - -  Change in appetite - - - - - - -  Feeling bad or failure about yourself  - - - - - - -  Trouble concentrating - - - - - - -  Moving slowly or fidgety/restless - - - - - - -  Suicidal thoughts - - - - - - -  PHQ-9 Score - - - 0 - - -  Difficult doing work/chores - - - - - - -  Some recent data might be hidden     Review of Systems  Constitutional: Negative.   HENT: Negative.    Eyes: Negative.   Respiratory: Negative.     Cardiovascular: Negative.   Gastrointestinal: Negative.   Endocrine: Negative.   Genitourinary: Negative.   Musculoskeletal: Negative.   Skin: Negative.   Allergic/Immunologic: Negative.   Neurological: Negative.   Hematological: Negative.   Psychiatric/Behavioral: Negative.        Objective:   Physical Exam Not performed as patient was seen via phone.     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  -Discussed current symptoms of pain and history of pain.  -Discussed benefits of exercise in reducing pain. -continue current regimen -d/c cymbalta given allergic reaction  -refilled percocet 7 day supply until she can get her spinal cord stimulator.  -discussed her MRI results that show spinal stenosis, disc bulge, and facet arthropathy -discussed that the spinal stenosis compressing on her right nerve root is likely contributing to her pain.  -increase gabapentin to 600mg  TID, will not increase further given waning efficacy and sleepiness -d/c hydrocodone since currently no longer effective.  -our staff will let her know that she should inform us if she needs to receive opioid medication from another provider following a surgery and we will permit them to take over prescribing, but she cannot receive medication from multiple providers. 1st warning letter will be sent.  -repeat lumbar MRI ordered given acutely worsening pain -Discussed following foods that may reduce pain: 1) Ginger (especially studied for arthritis)- reduce leukotriene production to decrease inflammation 2) Blueberries- high in phytonutrients that decrease inflammation 3) Salmon- marine omega-3s reduce joint swelling and pain 4) Pumpkin seeds- reduce inflammation 5) dark chocolate- reduces inflammation 6) turmeric- reduces inflammation 7) tart cherries -  reduce pain and stiffness 8) extra virgin olive oil - its compound olecanthal helps to block prostaglandins  9) chili peppers- can be eaten or applied topically via capsaicin 10) mint- helpful for headache, muscle aches, joint pain, and itching 11)  garlic- reduces inflammation  Link to further information on diet for chronic pain: [email or web address]   2) Chest congestion -continue nebulizer -continue steroids -continue amoxicillin.    5 minutes spent in discussion of her MRI lumbar results, her response to Cymbalta, and refilling her percocet.

## 2021-04-18 ENCOUNTER — Other Ambulatory Visit: Payer: Commercial Managed Care - HMO

## 2021-04-19 ENCOUNTER — Other Ambulatory Visit: Payer: Self-pay | Admitting: Physical Medicine and Rehabilitation

## 2021-04-19 ENCOUNTER — Telehealth: Payer: Self-pay | Admitting: *Deleted

## 2021-04-19 MED ORDER — OXYCODONE-ACETAMINOPHEN 5-325 MG PO TABS: 1.0000 | ORAL_TABLET | Freq: Four times a day (QID) | ORAL | 0 refills | Status: DC | PRN

## 2021-04-19 NOTE — Telephone Encounter (Signed)
Ms Alisha Espinoza is asking for a refill on her pain medication. Last fill date was 04/12/21.

## 2021-04-26 ENCOUNTER — Encounter: Payer: Self-pay | Admitting: Registered Nurse

## 2021-04-26 ENCOUNTER — Encounter (HOSPITAL_BASED_OUTPATIENT_CLINIC_OR_DEPARTMENT_OTHER): Payer: Medicare Other | Admitting: Registered Nurse

## 2021-04-26 ENCOUNTER — Other Ambulatory Visit: Payer: Self-pay

## 2021-04-26 VITALS — BP 131/88 | HR 103 | Ht 66.0 in | Wt 204.2 lb

## 2021-04-26 DIAGNOSIS — Z79891 Long term (current) use of opiate analgesic: Secondary | ICD-10-CM | POA: Diagnosis present

## 2021-04-26 DIAGNOSIS — M48062 Spinal stenosis, lumbar region with neurogenic claudication: Secondary | ICD-10-CM

## 2021-04-26 DIAGNOSIS — M5416 Radiculopathy, lumbar region: Secondary | ICD-10-CM | POA: Diagnosis present

## 2021-04-26 DIAGNOSIS — G894 Chronic pain syndrome: Secondary | ICD-10-CM

## 2021-04-26 DIAGNOSIS — M961 Postlaminectomy syndrome, not elsewhere classified: Secondary | ICD-10-CM

## 2021-04-26 DIAGNOSIS — Z5181 Encounter for therapeutic drug level monitoring: Secondary | ICD-10-CM

## 2021-04-26 MED ORDER — OXYCODONE-ACETAMINOPHEN 5-325 MG PO TABS: 1.0000 | ORAL_TABLET | Freq: Four times a day (QID) | ORAL | 0 refills | Status: DC | PRN

## 2021-04-26 NOTE — Progress Notes (Signed)
Subjective:    Patient ID: Alisha Espinoza, female    DOB: [birth year], 56 y.o.   MRN: [id number]  HPI: Alisha Espinoza is a 56 y.o. female who returns for follow up appointment for chronic pain and medication refill. She states her  pain is located in her lower back radiating into her right lower extremity and right foot. She rates her pain 10. Her current exercise regime is walking and performing stretching exercises.  [name] Morphine equivalent is 30.00 MME.   Last UDS was Performed on 12/19/2020, it was consistent.     Pain Inventory Average Pain 10 Pain Right Now 10 My pain is sharp, dull, tingling, and aching  In the last 24 hours, has pain interfered with the following? General activity 10 Relation with others 10 Enjoyment of life 8 What TIME of day is your pain at its worst? morning , daytime, evening, and night Sleep (in general) Poor  Pain is worse with: walking, bending, standing, and some activites Pain improves with: rest, heat/ice, and medication Relief from Meds: 4  Family History  Problem Relation Age of Onset   Diabetes Mother    Hypertension Father    Pancreatic cancer Sister    Colon cancer Neg Hx    Rectal cancer Neg Hx    Stomach cancer Neg Hx    Colon polyps Neg Hx    Esophageal cancer Neg Hx    Social History   Socioeconomic History   Marital status: Divorced    Spouse name: Not on file   Number of children: Not on file   Years of education: Not on file   Highest education level: Not on file  Occupational History   Not on file  Tobacco Use   Smoking status: Every Day    Packs/day: 0.50    Years: 30.00    Pack years: 15.00    Types: Cigarettes   Smokeless tobacco: Never   Tobacco comments:    10cigs/day  Vaping Use   Vaping Use: Never used  Substance and Sexual Activity   Alcohol use: Not Currently    Comment: occasionally   Drug use: No   Sexual activity: Yes    Birth control/protection: Surgical    Comment: Hysterectomy   Other Topics Concern   Not on file  Social History Narrative   Not on file   Social Determinants of Health   Financial Resource Strain: Not on file  Food Insecurity: Not on file  Transportation Needs: Not on file  Physical Activity: Not on file  Stress: Not on file  Social Connections: Not on file   Past Surgical History:  Procedure Laterality Date   ABDOMINAL HYSTERECTOMY  2000   ANTERIOR CERVICAL DECOMP/DISCECTOMY FUSION  03/19/2012   Procedure: ANTERIOR CERVICAL DECOMPRESSION/DISCECTOMY FUSION 1 LEVEL;  Surgeon: Melina Schools, MD;  Location: [facility];  Service: Orthopedics;  Laterality: Left;  Total Disc Replacement C4-5   ANTERIOR CERVICAL DECOMP/DISCECTOMY FUSION N/A 05/07/2012   Procedure: ANTERIOR CERVICAL DECOMPRESSION/DISCECTOMY FUSION 1 LEVEL/HARDWARE REMOVAL;  Surgeon: Melina Schools, MD;  Location: [facility];  Service: Orthopedics;  Laterality: N/A;  REMOVAL OF CERVICAL DISC REPLACEMENT AND ACDF C4-5   BACK SURGERY     CARPAL TUNNEL RELEASE Right 09/28/2018   Procedure: CARPAL TUNNEL RELEASE;  Surgeon: Eustace Moore, MD;  Location: [facility];  Service: Neurosurgery;  Laterality: Right;  CARPAL TUNNEL RELEASE   CERVICAL FUSION  05/07/2012   Dr Rolena Infante   COLONOSCOPY     CYSTECTOMY  L wrist   POLYPECTOMY     ROTATOR CUFF REPAIR     Right   TRIGGER FINGER RELEASE Left 05/03/2019   Procedure: LEFT LONG AND RING FINGER RELEASE TRIGGER FINGER/A-1 PULLEY;  Surgeon: Leanora Cover, MD;  Location: [facility];  Service: Orthopedics;  Laterality: Left;  beir block   TRIGGER FINGER RELEASE Right 09/23/2019   Procedure: RELEASE TRIGGER FINGER/A-1 PULLEY RIGHT THUMB, LONG AND RING FINGERS;  Surgeon: Leanora Cover, MD;  Location: [facility];  Service: Orthopedics;  Laterality: Right;   TRIGGER FINGER RELEASE Right 03/19/2021   Procedure: RELEASE TRIGGER FINGER/A-1 PULLEY RIGHT SMALL FINGER;  Surgeon: Leanora Cover, MD;  Location: [facility];  Service:  Orthopedics;  Laterality: Right;  30 MIN   TYMPANOSTOMY TUBE PLACEMENT     Past Surgical History:  Procedure Laterality Date   ABDOMINAL HYSTERECTOMY  2000   ANTERIOR CERVICAL DECOMP/DISCECTOMY FUSION  03/19/2012   Procedure: ANTERIOR CERVICAL DECOMPRESSION/DISCECTOMY FUSION 1 LEVEL;  Surgeon: Melina Schools, MD;  Location: [facility];  Service: Orthopedics;  Laterality: Left;  Total Disc Replacement C4-5   ANTERIOR CERVICAL DECOMP/DISCECTOMY FUSION N/A 05/07/2012   Procedure: ANTERIOR CERVICAL DECOMPRESSION/DISCECTOMY FUSION 1 LEVEL/HARDWARE REMOVAL;  Surgeon: Melina Schools, MD;  Location: [facility];  Service: Orthopedics;  Laterality: N/A;  REMOVAL OF CERVICAL DISC REPLACEMENT AND ACDF C4-5   BACK SURGERY     CARPAL TUNNEL RELEASE Right 09/28/2018   Procedure: CARPAL TUNNEL RELEASE;  Surgeon: Eustace Moore, MD;  Location: [facility];  Service: Neurosurgery;  Laterality: Right;  CARPAL TUNNEL RELEASE   CERVICAL FUSION  05/07/2012   Dr Rolena Infante   COLONOSCOPY     CYSTECTOMY     L wrist   POLYPECTOMY     ROTATOR CUFF REPAIR     Right   TRIGGER FINGER RELEASE Left 05/03/2019   Procedure: LEFT LONG AND RING FINGER RELEASE TRIGGER FINGER/A-1 PULLEY;  Surgeon: Leanora Cover, MD;  Location: [facility];  Service: Orthopedics;  Laterality: Left;  beir block   TRIGGER FINGER RELEASE Right 09/23/2019   Procedure: RELEASE TRIGGER FINGER/A-1 PULLEY RIGHT THUMB, LONG AND RING FINGERS;  Surgeon: Leanora Cover, MD;  Location: [facility];  Service: Orthopedics;  Laterality: Right;   TRIGGER FINGER RELEASE Right 03/19/2021   Procedure: RELEASE TRIGGER FINGER/A-1 PULLEY RIGHT SMALL FINGER;  Surgeon: Leanora Cover, MD;  Location: [facility];  Service: Orthopedics;  Laterality: Right;  30 MIN   TYMPANOSTOMY TUBE PLACEMENT     Past Medical History:  Diagnosis Date   Bronchitis    COPD (chronic obstructive pulmonary disease) ([place])    Diabetes mellitus ([place]) 01/19/2019   Dyspnea     Heart murmur    resolved "closed by age 54."    Hyperlipidemia    Hypertension    Seasonal allergies    BP 131/88    Pulse (!) 103    Ht 5\' 6"  (1.676 m)    Wt 204 lb 3.2 oz (92.6 kg)    SpO2 95%    BMI 32.96 kg/m   Opioid Risk Score:   Fall Risk Score:  `1  Depression screen PHQ 2/9  Depression screen [facility] 2/9 04/26/2021 03/06/2021 01/25/2021 12/19/2020 10/24/2020 09/15/2020 07/19/2020  Decreased Interest 1 0 0 1 0 0 1  Down, Depressed, Hopeless 1 0 0 1 0 0 1  PHQ - 2 Score 2 0 0 2 0 0 2  Altered sleeping - - - - 0 - -  Tired, decreased energy - - - - - - -  Change in appetite - - - - - - -  Feeling bad or failure about yourself  - - - - - - -  Trouble concentrating - - - - - - -  Moving slowly or fidgety/restless - - - - - - -  Suicidal thoughts - - - - - - -  PHQ-9 Score - - - - 0 - -  Difficult doing work/chores - - - - - - -  Some recent data might be hidden     Review of Systems  Constitutional: Negative.   HENT: Negative.    Eyes: Negative.   Respiratory: Negative.    Cardiovascular: Negative.   Gastrointestinal: Negative.   Endocrine: Negative.   Genitourinary: Negative.   Musculoskeletal:  Positive for back pain.  Skin: Negative.   Allergic/Immunologic: Negative.   Neurological:  Positive for weakness and numbness.  Hematological: Negative.   Psychiatric/Behavioral:  Positive for dysphoric mood and sleep disturbance.       Objective:   Physical Exam Vitals and nursing note reviewed.  Constitutional:      Appearance: Normal appearance.  Cardiovascular:     Rate and Rhythm: Normal rate and regular rhythm.     Pulses: Normal pulses.     Heart sounds: Normal heart sounds.  Pulmonary:     Effort: Pulmonary effort is normal.     Breath sounds: Normal breath sounds.  Musculoskeletal:     Cervical back: Normal range of motion and neck supple.     Comments: Normal Muscle Bulk and Muscle Testing Reveals:  Upper Extremities: Full ROM and Muscle Strength 5/5  Lower  Extremities: Right Lower Extremity: Decreased ROM  Right Lower Extremity Flexion Produces Pain into Right Lower Extremity Left Lower Extremity: Full ROM and Muscle Strength 5/5 Arises from Table Slowly using cane for support Antalgic  Gait     Skin:    General: Skin is warm and dry.  Neurological:     Mental Status: She is alert and oriented to person, place, and time.  Psychiatric:        Mood and Affect: Mood normal.        Behavior: Behavior normal.         Assessment & Plan:  1. Lumbosacral Radiculopathy: She is scheduled to have Spinal Cord Stimulator Placed on 02/ 17/2023.Continue Gabapentin. Continue HEP as Tolerated. Continue to Monitor.04/26/2021 2. Lumbar Spinal Stenosis: Continue HEP as Tolerated. Continue current medication regimen. Continue to monitor.04/26/2021 3. Chronic Pain Syndrome: Refilled Oxycodone 5/325 mg one tablet 4 times  a day as needed for pain #120  We will continue the opioid monitoring program, this consists of regular clinic visits, examinations, urine drug screen, pill counts as well as use of [place] Controlled Substance Reporting system. A 12 month History has been reviewed on the [place] Controlled Substance Reporting System on 04/26/2021 4. Polyarthralgia: Continue HEP as Tolerated. Continue to Monitor. 04/26/2021  F/U in 1 month

## 2021-04-29 ENCOUNTER — Other Ambulatory Visit: Payer: Self-pay | Admitting: Pulmonary Disease

## 2021-05-18 ENCOUNTER — Telehealth: Payer: Self-pay | Admitting: *Deleted

## 2021-05-18 NOTE — Telephone Encounter (Signed)
Mrs Seki called to report that she just had a procedure done ( stimulator placed) and she was given pain medication to take and does not want to get in trouble with our clinic, so she is reporting that an Rx received.

## 2021-05-29 ENCOUNTER — Telehealth: Payer: Self-pay

## 2021-05-29 MED ORDER — OXYCODONE-ACETAMINOPHEN 5-325 MG PO TABS: 1.0000 | ORAL_TABLET | Freq: Four times a day (QID) | ORAL | 0 refills | Status: AC | PRN

## 2021-05-29 NOTE — Telephone Encounter (Signed)
Placed a call to Alisha Espinoza, she has spinal stimulator placed. At this time she is still using her Oxycodone every 6 hours. Oxycodone e-scribed today, she verbalizes understanding. She is aware of her March appointment and verbalizes understanding.

## 2021-05-29 NOTE — Telephone Encounter (Signed)
Patient called for refill of Oxycodone 5-325 mg   mp report:   Filled  Written  ID  Drug  QTY  Days  Prescriber  RX #  Dispenser  Refill  Daily Dose*  Pymt Type  PMP  05/18/2021 05/18/2021 1  Oxycodone Hcl (Ir) 5 Mg Tablet 20.00 5 Da Bin [id number] [facility] [phone]) 0/0 30.00 MME Medicare [place] 04/26/2021 04/26/2021 1  Oxycodone-Acetaminophen 5-325 120.00 30 Eu Tho [id number] [facility] (0202) 0/0 30.00 MME Medicare [place]

## 2021-06-14 ENCOUNTER — Other Ambulatory Visit: Payer: Self-pay | Admitting: Physical Medicine and Rehabilitation

## 2021-06-19 ENCOUNTER — Encounter: Payer: Medicare Other | Admitting: Registered Nurse

## 2021-07-31 ENCOUNTER — Ambulatory Visit: Payer: Medicare Other | Admitting: Physical Medicine and Rehabilitation

## 2021-08-13 ENCOUNTER — Encounter: Payer: Medicare Other | Admitting: Physical Medicine and Rehabilitation

## 2021-08-31 ENCOUNTER — Telehealth: Payer: Self-pay | Admitting: Physical Medicine and Rehabilitation

## 2021-08-31 ENCOUNTER — Telehealth: Payer: Self-pay | Admitting: Orthopaedic Surgery

## 2021-08-31 NOTE — Telephone Encounter (Signed)
Pt called for a return to work note. She is wanting to go back 09/03/2021 Lorin Mercy took her out of work she said   CB [phone]

## 2021-08-31 NOTE — Telephone Encounter (Signed)
Pt called for a return to work note. She is wanting to go back 09/03/2021  CB [phone]

## 2021-08-31 NOTE — Telephone Encounter (Signed)
Please advise 

## 2021-09-03 NOTE — Telephone Encounter (Signed)
Sent to pt. Portal. Lvm informing pt

## 2021-09-03 NOTE — Telephone Encounter (Signed)
Note completed 

## 2021-09-16 ENCOUNTER — Other Ambulatory Visit: Payer: Self-pay | Admitting: Pulmonary Disease

## 2021-12-01 ENCOUNTER — Other Ambulatory Visit: Payer: Self-pay | Admitting: Physical Medicine and Rehabilitation

## 2022-01-11 ENCOUNTER — Telehealth: Payer: Self-pay | Admitting: Orthopaedic Surgery

## 2022-01-11 NOTE — Telephone Encounter (Signed)
Pt called requesting a medical records. Pt lives in [place] and I have mailed her the medical relase form. Please send all records form office visits with Dr Junius Roads, Dr. Ernestina Patches, and Dr Lorin Mercy. First office visit started 03/23/19 with [facility]. Pt asked for records to be mailed back at [address] [place] [id number]. Call pt if have any questions. Mailed release form 01/11/2022

## 2022-01-14 NOTE — Telephone Encounter (Signed)
Will process upon receipt of the auth

## 2022-04-14 ENCOUNTER — Other Ambulatory Visit: Payer: Self-pay | Admitting: Pulmonary Disease

## 2022-08-02 ENCOUNTER — Other Ambulatory Visit: Payer: Self-pay | Admitting: Pulmonary Disease

## 2023-06-02 ENCOUNTER — Other Ambulatory Visit: Payer: Self-pay | Admitting: Physical Medicine and Rehabilitation

## 2024-01-03 ENCOUNTER — Other Ambulatory Visit: Payer: Self-pay | Admitting: Physical Medicine and Rehabilitation
